# Patient Record
Sex: Female | Born: 1935 | Race: White | Hispanic: No | Marital: Married | Smoking: Former smoker
Health system: Southern US, Community
[De-identification: ages and names within clinical notes are randomized; demographics above are authoritative.]

## PROBLEM LIST (undated history)

## (undated) ENCOUNTER — Emergency Department (HOSPITAL_COMMUNITY): Payer: Medicare Other | Source: Home / Self Care

## (undated) DIAGNOSIS — I509 Heart failure, unspecified: Secondary | ICD-10-CM

## (undated) DIAGNOSIS — I4891 Unspecified atrial fibrillation: Secondary | ICD-10-CM

## (undated) DIAGNOSIS — K5090 Crohn's disease, unspecified, without complications: Secondary | ICD-10-CM

## (undated) DIAGNOSIS — I10 Essential (primary) hypertension: Secondary | ICD-10-CM

## (undated) DIAGNOSIS — N189 Chronic kidney disease, unspecified: Secondary | ICD-10-CM

## (undated) DIAGNOSIS — I499 Cardiac arrhythmia, unspecified: Secondary | ICD-10-CM

## (undated) DIAGNOSIS — S72009A Fracture of unspecified part of neck of unspecified femur, initial encounter for closed fracture: Secondary | ICD-10-CM

## (undated) HISTORY — PX: FRACTURE SURGERY: SHX138

## (undated) HISTORY — PX: OTHER SURGICAL HISTORY: SHX169

## (undated) HISTORY — DX: Chronic kidney disease, unspecified: N18.9

---

## 2015-08-11 DIAGNOSIS — H401131 Primary open-angle glaucoma, bilateral, mild stage: Secondary | ICD-10-CM | POA: Diagnosis not present

## 2015-08-11 DIAGNOSIS — H04123 Dry eye syndrome of bilateral lacrimal glands: Secondary | ICD-10-CM | POA: Diagnosis not present

## 2015-08-25 DIAGNOSIS — F329 Major depressive disorder, single episode, unspecified: Secondary | ICD-10-CM | POA: Diagnosis not present

## 2015-08-25 DIAGNOSIS — F411 Generalized anxiety disorder: Secondary | ICD-10-CM | POA: Diagnosis not present

## 2015-11-02 DIAGNOSIS — H401131 Primary open-angle glaucoma, bilateral, mild stage: Secondary | ICD-10-CM | POA: Diagnosis not present

## 2015-11-02 DIAGNOSIS — H04123 Dry eye syndrome of bilateral lacrimal glands: Secondary | ICD-10-CM | POA: Diagnosis not present

## 2015-11-14 DIAGNOSIS — Z7901 Long term (current) use of anticoagulants: Secondary | ICD-10-CM | POA: Diagnosis not present

## 2015-11-14 DIAGNOSIS — I4891 Unspecified atrial fibrillation: Secondary | ICD-10-CM | POA: Diagnosis not present

## 2015-11-14 DIAGNOSIS — I10 Essential (primary) hypertension: Secondary | ICD-10-CM | POA: Diagnosis not present

## 2015-11-14 DIAGNOSIS — Z79899 Other long term (current) drug therapy: Secondary | ICD-10-CM | POA: Diagnosis not present

## 2015-11-22 DIAGNOSIS — F411 Generalized anxiety disorder: Secondary | ICD-10-CM | POA: Diagnosis not present

## 2015-11-22 DIAGNOSIS — F329 Major depressive disorder, single episode, unspecified: Secondary | ICD-10-CM | POA: Diagnosis not present

## 2016-02-09 DIAGNOSIS — H401131 Primary open-angle glaucoma, bilateral, mild stage: Secondary | ICD-10-CM | POA: Diagnosis not present

## 2016-02-09 DIAGNOSIS — H04123 Dry eye syndrome of bilateral lacrimal glands: Secondary | ICD-10-CM | POA: Diagnosis not present

## 2016-03-13 DIAGNOSIS — I10 Essential (primary) hypertension: Secondary | ICD-10-CM | POA: Diagnosis not present

## 2016-03-13 DIAGNOSIS — F419 Anxiety disorder, unspecified: Secondary | ICD-10-CM | POA: Diagnosis not present

## 2016-03-13 DIAGNOSIS — F329 Major depressive disorder, single episode, unspecified: Secondary | ICD-10-CM | POA: Diagnosis not present

## 2016-03-13 DIAGNOSIS — F411 Generalized anxiety disorder: Secondary | ICD-10-CM | POA: Diagnosis not present

## 2016-04-16 DIAGNOSIS — F411 Generalized anxiety disorder: Secondary | ICD-10-CM | POA: Diagnosis not present

## 2016-04-16 DIAGNOSIS — Z79899 Other long term (current) drug therapy: Secondary | ICD-10-CM | POA: Diagnosis not present

## 2016-04-16 DIAGNOSIS — F419 Anxiety disorder, unspecified: Secondary | ICD-10-CM | POA: Diagnosis not present

## 2016-04-16 DIAGNOSIS — F329 Major depressive disorder, single episode, unspecified: Secondary | ICD-10-CM | POA: Diagnosis not present

## 2016-05-16 DIAGNOSIS — Z79899 Other long term (current) drug therapy: Secondary | ICD-10-CM | POA: Diagnosis not present

## 2016-05-16 DIAGNOSIS — Z7901 Long term (current) use of anticoagulants: Secondary | ICD-10-CM | POA: Diagnosis not present

## 2016-05-16 DIAGNOSIS — R609 Edema, unspecified: Secondary | ICD-10-CM | POA: Diagnosis not present

## 2016-05-16 DIAGNOSIS — I4891 Unspecified atrial fibrillation: Secondary | ICD-10-CM | POA: Diagnosis not present

## 2016-05-16 DIAGNOSIS — I10 Essential (primary) hypertension: Secondary | ICD-10-CM | POA: Diagnosis not present

## 2016-05-16 DIAGNOSIS — Z87891 Personal history of nicotine dependence: Secondary | ICD-10-CM | POA: Diagnosis not present

## 2016-05-21 DIAGNOSIS — M1612 Unilateral primary osteoarthritis, left hip: Secondary | ICD-10-CM | POA: Diagnosis not present

## 2016-05-21 DIAGNOSIS — M25552 Pain in left hip: Secondary | ICD-10-CM | POA: Diagnosis not present

## 2016-05-23 DIAGNOSIS — I10 Essential (primary) hypertension: Secondary | ICD-10-CM | POA: Diagnosis not present

## 2016-05-23 DIAGNOSIS — M791 Myalgia: Secondary | ICD-10-CM | POA: Diagnosis not present

## 2016-05-23 DIAGNOSIS — I509 Heart failure, unspecified: Secondary | ICD-10-CM

## 2016-05-23 HISTORY — DX: Heart failure, unspecified: I50.9

## 2016-05-28 DIAGNOSIS — H40013 Open angle with borderline findings, low risk, bilateral: Secondary | ICD-10-CM | POA: Diagnosis not present

## 2016-05-30 DIAGNOSIS — I371 Nonrheumatic pulmonary valve insufficiency: Secondary | ICD-10-CM | POA: Diagnosis not present

## 2016-05-30 DIAGNOSIS — I351 Nonrheumatic aortic (valve) insufficiency: Secondary | ICD-10-CM | POA: Diagnosis not present

## 2016-05-30 DIAGNOSIS — I340 Nonrheumatic mitral (valve) insufficiency: Secondary | ICD-10-CM | POA: Diagnosis not present

## 2016-05-30 DIAGNOSIS — I517 Cardiomegaly: Secondary | ICD-10-CM | POA: Diagnosis not present

## 2016-05-30 DIAGNOSIS — I361 Nonrheumatic tricuspid (valve) insufficiency: Secondary | ICD-10-CM | POA: Diagnosis not present

## 2016-05-31 DIAGNOSIS — I4891 Unspecified atrial fibrillation: Secondary | ICD-10-CM | POA: Diagnosis present

## 2016-05-31 DIAGNOSIS — I361 Nonrheumatic tricuspid (valve) insufficiency: Secondary | ICD-10-CM | POA: Diagnosis not present

## 2016-05-31 DIAGNOSIS — E872 Acidosis: Secondary | ICD-10-CM | POA: Diagnosis present

## 2016-05-31 DIAGNOSIS — Z880 Allergy status to penicillin: Secondary | ICD-10-CM | POA: Diagnosis not present

## 2016-05-31 DIAGNOSIS — R8271 Bacteriuria: Secondary | ICD-10-CM | POA: Diagnosis present

## 2016-05-31 DIAGNOSIS — I509 Heart failure, unspecified: Secondary | ICD-10-CM | POA: Diagnosis not present

## 2016-05-31 DIAGNOSIS — I130 Hypertensive heart and chronic kidney disease with heart failure and stage 1 through stage 4 chronic kidney disease, or unspecified chronic kidney disease: Secondary | ICD-10-CM | POA: Diagnosis not present

## 2016-05-31 DIAGNOSIS — R0689 Other abnormalities of breathing: Secondary | ICD-10-CM | POA: Diagnosis not present

## 2016-05-31 DIAGNOSIS — N183 Chronic kidney disease, stage 3 (moderate): Secondary | ICD-10-CM | POA: Diagnosis present

## 2016-05-31 DIAGNOSIS — I5020 Unspecified systolic (congestive) heart failure: Secondary | ICD-10-CM | POA: Diagnosis not present

## 2016-05-31 DIAGNOSIS — I10 Essential (primary) hypertension: Secondary | ICD-10-CM | POA: Diagnosis not present

## 2016-05-31 DIAGNOSIS — Z7901 Long term (current) use of anticoagulants: Secondary | ICD-10-CM | POA: Diagnosis not present

## 2016-05-31 DIAGNOSIS — R0602 Shortness of breath: Secondary | ICD-10-CM | POA: Diagnosis not present

## 2016-05-31 DIAGNOSIS — E785 Hyperlipidemia, unspecified: Secondary | ICD-10-CM | POA: Diagnosis present

## 2016-05-31 DIAGNOSIS — I5023 Acute on chronic systolic (congestive) heart failure: Secondary | ICD-10-CM | POA: Diagnosis present

## 2016-05-31 DIAGNOSIS — I517 Cardiomegaly: Secondary | ICD-10-CM | POA: Diagnosis not present

## 2016-05-31 DIAGNOSIS — R0902 Hypoxemia: Secondary | ICD-10-CM | POA: Diagnosis not present

## 2016-05-31 DIAGNOSIS — I482 Chronic atrial fibrillation: Secondary | ICD-10-CM | POA: Diagnosis not present

## 2016-05-31 DIAGNOSIS — I214 Non-ST elevation (NSTEMI) myocardial infarction: Secondary | ICD-10-CM | POA: Diagnosis present

## 2016-05-31 DIAGNOSIS — J9601 Acute respiratory failure with hypoxia: Secondary | ICD-10-CM | POA: Diagnosis not present

## 2016-05-31 DIAGNOSIS — R05 Cough: Secondary | ICD-10-CM | POA: Diagnosis not present

## 2016-05-31 DIAGNOSIS — R739 Hyperglycemia, unspecified: Secondary | ICD-10-CM | POA: Diagnosis present

## 2016-05-31 DIAGNOSIS — I340 Nonrheumatic mitral (valve) insufficiency: Secondary | ICD-10-CM | POA: Diagnosis not present

## 2016-06-05 DIAGNOSIS — E789 Disorder of lipoprotein metabolism, unspecified: Secondary | ICD-10-CM | POA: Diagnosis not present

## 2016-06-06 DIAGNOSIS — E034 Atrophy of thyroid (acquired): Secondary | ICD-10-CM | POA: Diagnosis not present

## 2016-06-06 DIAGNOSIS — F331 Major depressive disorder, recurrent, moderate: Secondary | ICD-10-CM | POA: Diagnosis not present

## 2016-06-06 DIAGNOSIS — I509 Heart failure, unspecified: Secondary | ICD-10-CM | POA: Diagnosis not present

## 2016-06-06 DIAGNOSIS — I10 Essential (primary) hypertension: Secondary | ICD-10-CM | POA: Diagnosis not present

## 2016-06-07 DIAGNOSIS — I4891 Unspecified atrial fibrillation: Secondary | ICD-10-CM | POA: Diagnosis not present

## 2016-06-07 DIAGNOSIS — I509 Heart failure, unspecified: Secondary | ICD-10-CM | POA: Diagnosis not present

## 2016-06-07 DIAGNOSIS — R197 Diarrhea, unspecified: Secondary | ICD-10-CM | POA: Diagnosis not present

## 2016-06-07 DIAGNOSIS — I10 Essential (primary) hypertension: Secondary | ICD-10-CM | POA: Diagnosis not present

## 2016-06-07 DIAGNOSIS — R609 Edema, unspecified: Secondary | ICD-10-CM | POA: Diagnosis not present

## 2016-06-07 DIAGNOSIS — Z79899 Other long term (current) drug therapy: Secondary | ICD-10-CM | POA: Diagnosis not present

## 2016-06-07 DIAGNOSIS — Z7901 Long term (current) use of anticoagulants: Secondary | ICD-10-CM | POA: Diagnosis not present

## 2016-06-11 DIAGNOSIS — Z87891 Personal history of nicotine dependence: Secondary | ICD-10-CM | POA: Diagnosis not present

## 2016-06-11 DIAGNOSIS — Z7901 Long term (current) use of anticoagulants: Secondary | ICD-10-CM | POA: Diagnosis not present

## 2016-06-11 DIAGNOSIS — I10 Essential (primary) hypertension: Secondary | ICD-10-CM | POA: Diagnosis not present

## 2016-06-11 DIAGNOSIS — E7800 Pure hypercholesterolemia, unspecified: Secondary | ICD-10-CM | POA: Diagnosis not present

## 2016-06-11 DIAGNOSIS — Z79899 Other long term (current) drug therapy: Secondary | ICD-10-CM | POA: Diagnosis not present

## 2016-06-11 DIAGNOSIS — I480 Paroxysmal atrial fibrillation: Secondary | ICD-10-CM | POA: Diagnosis not present

## 2016-07-24 ENCOUNTER — Encounter (HOSPITAL_COMMUNITY): Payer: Self-pay

## 2016-07-24 ENCOUNTER — Emergency Department (HOSPITAL_COMMUNITY): Payer: Medicare Other

## 2016-07-24 ENCOUNTER — Inpatient Hospital Stay (HOSPITAL_COMMUNITY)
Admission: EM | Admit: 2016-07-24 | Discharge: 2016-07-30 | DRG: 481 | Disposition: A | Discharge status: Skilled Nursing Facility | Payer: Medicare Other | Attending: Internal Medicine | Admitting: Internal Medicine

## 2016-07-24 DIAGNOSIS — D62 Acute posthemorrhagic anemia: Secondary | ICD-10-CM | POA: Diagnosis not present

## 2016-07-24 DIAGNOSIS — Z79899 Other long term (current) drug therapy: Secondary | ICD-10-CM

## 2016-07-24 DIAGNOSIS — K5090 Crohn's disease, unspecified, without complications: Secondary | ICD-10-CM | POA: Diagnosis present

## 2016-07-24 DIAGNOSIS — R262 Difficulty in walking, not elsewhere classified: Secondary | ICD-10-CM | POA: Diagnosis not present

## 2016-07-24 DIAGNOSIS — F419 Anxiety disorder, unspecified: Secondary | ICD-10-CM | POA: Diagnosis present

## 2016-07-24 DIAGNOSIS — R079 Chest pain, unspecified: Secondary | ICD-10-CM | POA: Diagnosis not present

## 2016-07-24 DIAGNOSIS — I110 Hypertensive heart disease with heart failure: Secondary | ICD-10-CM | POA: Diagnosis present

## 2016-07-24 DIAGNOSIS — I480 Paroxysmal atrial fibrillation: Secondary | ICD-10-CM

## 2016-07-24 DIAGNOSIS — E440 Moderate protein-calorie malnutrition: Secondary | ICD-10-CM | POA: Diagnosis present

## 2016-07-24 DIAGNOSIS — S72001A Fracture of unspecified part of neck of right femur, initial encounter for closed fracture: Secondary | ICD-10-CM | POA: Diagnosis present

## 2016-07-24 DIAGNOSIS — S299XXA Unspecified injury of thorax, initial encounter: Secondary | ICD-10-CM | POA: Diagnosis not present

## 2016-07-24 DIAGNOSIS — Y92512 Supermarket, store or market as the place of occurrence of the external cause: Secondary | ICD-10-CM

## 2016-07-24 DIAGNOSIS — I10 Essential (primary) hypertension: Secondary | ICD-10-CM

## 2016-07-24 DIAGNOSIS — I482 Chronic atrial fibrillation: Secondary | ICD-10-CM | POA: Diagnosis present

## 2016-07-24 DIAGNOSIS — I4819 Other persistent atrial fibrillation: Secondary | ICD-10-CM | POA: Diagnosis not present

## 2016-07-24 DIAGNOSIS — I4891 Unspecified atrial fibrillation: Secondary | ICD-10-CM | POA: Diagnosis not present

## 2016-07-24 DIAGNOSIS — S72141A Displaced intertrochanteric fracture of right femur, initial encounter for closed fracture: Secondary | ICD-10-CM | POA: Diagnosis not present

## 2016-07-24 DIAGNOSIS — W19XXXA Unspecified fall, initial encounter: Secondary | ICD-10-CM | POA: Diagnosis present

## 2016-07-24 DIAGNOSIS — I5032 Chronic diastolic (congestive) heart failure: Secondary | ICD-10-CM | POA: Diagnosis not present

## 2016-07-24 DIAGNOSIS — I509 Heart failure, unspecified: Secondary | ICD-10-CM | POA: Diagnosis present

## 2016-07-24 DIAGNOSIS — F329 Major depressive disorder, single episode, unspecified: Secondary | ICD-10-CM | POA: Diagnosis present

## 2016-07-24 DIAGNOSIS — S72009A Fracture of unspecified part of neck of unspecified femur, initial encounter for closed fracture: Secondary | ICD-10-CM | POA: Diagnosis not present

## 2016-07-24 DIAGNOSIS — Z7901 Long term (current) use of anticoagulants: Secondary | ICD-10-CM

## 2016-07-24 DIAGNOSIS — Z419 Encounter for procedure for purposes other than remedying health state, unspecified: Secondary | ICD-10-CM

## 2016-07-24 DIAGNOSIS — R030 Elevated blood-pressure reading, without diagnosis of hypertension: Secondary | ICD-10-CM | POA: Diagnosis not present

## 2016-07-24 DIAGNOSIS — M1990 Unspecified osteoarthritis, unspecified site: Secondary | ICD-10-CM | POA: Diagnosis not present

## 2016-07-24 DIAGNOSIS — S72001D Fracture of unspecified part of neck of right femur, subsequent encounter for closed fracture with routine healing: Secondary | ICD-10-CM | POA: Diagnosis not present

## 2016-07-24 DIAGNOSIS — Z9181 History of falling: Secondary | ICD-10-CM | POA: Diagnosis not present

## 2016-07-24 DIAGNOSIS — M79604 Pain in right leg: Secondary | ICD-10-CM

## 2016-07-24 DIAGNOSIS — M25551 Pain in right hip: Secondary | ICD-10-CM | POA: Diagnosis not present

## 2016-07-24 HISTORY — DX: Crohn's disease, unspecified, without complications: K50.90

## 2016-07-24 HISTORY — DX: Essential (primary) hypertension: I10

## 2016-07-24 HISTORY — DX: Heart failure, unspecified: I50.9

## 2016-07-24 HISTORY — DX: Cardiac arrhythmia, unspecified: I49.9

## 2016-07-24 HISTORY — DX: Unspecified atrial fibrillation: I48.91

## 2016-07-24 LAB — COMPREHENSIVE METABOLIC PANEL
ALBUMIN: 3.7 g/dL (ref 3.5–5.0)
ALT: 10 U/L — AB (ref 14–54)
AST: 18 U/L (ref 15–41)
Alkaline Phosphatase: 88 U/L (ref 38–126)
Anion gap: 9 (ref 5–15)
BUN: 22 mg/dL — AB (ref 6–20)
CO2: 20 mmol/L — AB (ref 22–32)
Calcium: 8.5 mg/dL — ABNORMAL LOW (ref 8.9–10.3)
Chloride: 107 mmol/L (ref 101–111)
Creatinine, Ser: 1.07 mg/dL — ABNORMAL HIGH (ref 0.44–1.00)
GFR calc Af Amer: 55 mL/min — ABNORMAL LOW (ref >60)
GFR, EST NON AFRICAN AMERICAN: 47 mL/min — AB (ref >60)
Glucose, Bld: 127 mg/dL — ABNORMAL HIGH (ref 65–99)
POTASSIUM: 4.2 mmol/L (ref 3.5–5.1)
SODIUM: 136 mmol/L (ref 135–145)
Total Bilirubin: 0.5 mg/dL (ref 0.3–1.2)
Total Protein: 7 g/dL (ref 6.5–8.1)

## 2016-07-24 LAB — CBC WITH DIFFERENTIAL/PLATELET
BASOS ABS: 0 10*3/uL (ref 0.0–0.1)
BASOS PCT: 0 %
EOS ABS: 0 10*3/uL (ref 0.0–0.7)
EOS PCT: 0 %
HCT: 30.8 % — ABNORMAL LOW (ref 36.0–46.0)
Hemoglobin: 9.7 g/dL — ABNORMAL LOW (ref 12.0–15.0)
Lymphocytes Relative: 4 %
Lymphs Abs: 0.5 10*3/uL — ABNORMAL LOW (ref 0.7–4.0)
MCH: 27.2 pg (ref 26.0–34.0)
MCHC: 31.5 g/dL (ref 30.0–36.0)
MCV: 86.5 fL (ref 78.0–100.0)
MONO ABS: 0.4 10*3/uL (ref 0.1–1.0)
Monocytes Relative: 4 %
Neutro Abs: 9.6 10*3/uL — ABNORMAL HIGH (ref 1.7–7.7)
Neutrophils Relative %: 92 %
PLATELETS: 199 10*3/uL (ref 150–400)
RBC: 3.56 MIL/uL — AB (ref 3.87–5.11)
RDW: 15 % (ref 11.5–15.5)
WBC: 10.5 10*3/uL (ref 4.0–10.5)

## 2016-07-24 MED ORDER — ONDANSETRON HCL 4 MG/2ML IJ SOLN
4.0000 mg | Freq: Once | INTRAMUSCULAR | Status: AC
  Administered 2016-07-24: 4 mg via INTRAVENOUS

## 2016-07-24 MED ORDER — ONDANSETRON HCL 4 MG/2ML IJ SOLN
4.0000 mg | Freq: Once | INTRAMUSCULAR | Status: DC
  Filled 2016-07-24: qty 2

## 2016-07-24 MED ORDER — HYDROMORPHONE HCL 1 MG/ML IJ SOLN
1.0000 mg | Freq: Once | INTRAMUSCULAR | Status: AC
Start: 2016-07-24 — End: 2016-07-24
  Administered 2016-07-24: 1 mg via INTRAVENOUS
  Filled 2016-07-24: qty 1

## 2016-07-24 MED ORDER — HYDROMORPHONE HCL 1 MG/ML IJ SOLN
1.0000 mg | Freq: Once | INTRAMUSCULAR | Status: DC
Start: 2016-07-24 — End: 2016-07-24

## 2016-07-24 NOTE — ED Provider Notes (Signed)
[facility] DEPT Provider Note   CSN: [id number] Arrival date & time: 07/24/16  6301     History   Chief Complaint Chief Complaint  Patient presents with  . Fall    HPI Melissa Mcdaniel is a 83 y.o. female.  The history is provided by the patient. No language interpreter was used.  Fall  This is a new problem. The current episode started 1 to 2 hours ago. The problem occurs constantly. The problem has not changed since onset.Pertinent negatives include no chest pain and no shortness of breath. Nothing aggravates the symptoms. Nothing relieves the symptoms. She has tried nothing for the symptoms.  Pt fell at grocery store and hit right hip.  Pt denies any other areas of injury.  No impact of head.  Pt has a history of atrial fib. She is on eliquis  Past Medical History:  Diagnosis Date  . A-fib ([place])   . Crohn's disease ([place])   . Hypertension     There are no active problems to display for this patient.   History reviewed. No pertinent surgical history.  OB History    No data available       Home Medications    Prior to Admission medications   Medication Sig Start Date End Date Taking? Authorizing Provider  amiodarone (PACERONE) 200 MG tablet Take 200 mg by mouth daily.   Yes Historical Provider, MD  apixaban (ELIQUIS) 2.5 MG TABS tablet Take 2.5 mg by mouth 2 (two) times daily.   Yes Historical Provider, MD  ARIPiprazole (ABILIFY) 5 MG tablet Take 5 mg by mouth daily.   Yes Historical Provider, MD  carvedilol (COREG) 3.125 MG tablet TK 1 T PO BID MEALS 06/02/16  Yes Historical Provider, MD  clonazePAM (KLONOPIN) 0.5 MG tablet TK 1 T PO BID 06/19/16  Yes Historical Provider, MD  hydrALAZINE (APRESOLINE) 25 MG tablet Take 25 mg by mouth 2 (two) times daily.   Yes Historical Provider, MD  latanoprost (XALATAN) 0.005 % ophthalmic solution Place 1 drop into both eyes at bedtime.   Yes Historical Provider, MD  lisinopril (PRINIVIL,ZESTRIL) 2.5 MG tablet TK 1 T PO D  06/02/16  Yes Historical Provider, MD  Multiple Vitamins-Iron (ONE-TABLET-DAILY/IRON PO) Take 1 tablet by mouth daily.   Yes Historical Provider, MD  Niacin (VITAMIN B-3 PO) Take 1 tablet by mouth daily.   Yes Historical Provider, MD  furosemide (LASIX) 20 MG tablet TK 1 T PO QD OR UTD 05/16/16   Historical Provider, MD    Family History No family history on file.  Social History Social History  Substance Use Topics  . Smoking status: Never Smoker  . Smokeless tobacco: Never Used  . Alcohol use No     Allergies   Penicillins   Review of Systems Review of Systems  Respiratory: Negative for shortness of breath.   Cardiovascular: Negative for chest pain.  All other systems reviewed and are negative.    Physical Exam Updated Vital Signs BP 181/72 (BP Location: Right Arm)   Pulse 95   Temp 97.5 F (36.4 C) (Oral)   Resp 20   Ht 5' 1"  (1.549 m)   Wt 48.5 kg   SpO2 93%   BMI 20.22 kg/m   Physical Exam  Constitutional: She appears well-developed and well-nourished.  HENT:  Head: Normocephalic and atraumatic.  Eyes: Pupils are equal, round, and reactive to light.  Neck: Normal range of motion.  Cardiovascular: Normal rate.   Pulmonary/Chest: Effort normal.  Abdominal: Soft.  Musculoskeletal: She exhibits tenderness and deformity.  Swollen right hip.  Shortened, pain with movement.    Neurological: She is alert.  Skin: Skin is warm.  Psychiatric: She has a normal mood and affect.  Nursing note and vitals reviewed.    ED Treatments / Results  Labs (all labs ordered are listed, but only abnormal results are displayed) Labs Reviewed  CBC WITH DIFFERENTIAL/PLATELET - Abnormal; Notable for the following:       Result Value   RBC 3.56 (*)    Hemoglobin 9.7 (*)    HCT 30.8 (*)    Neutro Abs 9.6 (*)    Lymphs Abs 0.5 (*)    All other components within normal limits  COMPREHENSIVE METABOLIC PANEL - Abnormal; Notable for the following:    CO2 20 (*)    Glucose,  Bld 127 (*)    BUN 22 (*)    Creatinine, Ser 1.07 (*)    Calcium 8.5 (*)    ALT 10 (*)    GFR calc non Af Amer 47 (*)    GFR calc Af Amer 55 (*)    All other components within normal limits    EKG  EKG Interpretation None       Radiology Dg Chest 1 View  Result Date: 07/24/2016 CLINICAL DATA:  Pain to the right hip status post fall EXAM: CHEST 1 VIEW COMPARISON:  None. FINDINGS: There are low lung volumes bilaterally with mild elevation of the right diaphragm. Mildly coarse interstitial pattern suspect chronic change. No acute consolidation or effusion. Heart size upper normal. Atherosclerosis. No pneumothorax. IMPRESSION: 1. Low lung volumes without acute infiltrate 2. Borderline cardiomegaly 3. Diffuse coarsening of the interstitium, suspect chronic change. Electronically Signed   By: Donavan Foil M.D.   On: 07/24/2016 20:08   Dg Hip Unilat  With Pelvis 2-3 Views Right  Result Date: 07/24/2016 CLINICAL DATA:  83 y/o  F; right hip pain after fall. EXAM: DG HIP (WITH OR WITHOUT PELVIS) 2-3V RIGHT COMPARISON:  None. FINDINGS: Mildly displaced and comminuted right proximal femur intertrochanteric fracture with medial displacement of the lesser tuberosity. No other pelvic fracture is identified. The left proximal femur is well maintained. The right femoral head is well seated in the acetabulum. IMPRESSION: Mildly displaced and comminuted right proximal femur intertrochanteric fracture and medial displacement of the lesser tuberosity. No joint dislocation. No pelvic fracture is identified. Electronically Signed   By: Kristine Garbe M.D.   On: 07/24/2016 20:07    Procedures Procedures (including critical care time)  Medications Ordered in ED Medications  HYDROmorphone (DILAUDID) injection 1 mg (1 mg Intravenous Given 07/24/16 1908)  ondansetron (ZOFRAN) injection 4 mg (4 mg Intravenous Given 07/24/16 1905)     Initial Impression / Assessment and Plan / ED Course  I have reviewed  the triage vital signs and the nursing notes.  Pertinent labs & imaging results that were available during my care of the patient were reviewed by me and considered in my medical decision making (see chart for details).  Clinical Course as of Jul 25 2019  Tue Jul 24, 2016  1837 DG Hip Unilat  With Pelvis 2-3 Views Right [LS]    Clinical Course User Index [LS] Fransico Meadow, PA-C    I spoke to Dr.  Lorin Mercy.  He advised hospitalist admission.  He will see tomorrow and plan on surgery after holding eliquis  Final Clinical Impressions(s) / ED Diagnoses   Final diagnoses:  Closed right hip fracture, initial  encounter [facility])    New Prescriptions New Prescriptions   No medications on file     Fransico Meadow, Hershal Coria 07/24/16 2059    Jola Schmidt, MD 07/25/16 [phone]

## 2016-07-24 NOTE — ED Triage Notes (Signed)
Per [facility], patient was shopping at the grocery and fell. Patient is independent from home, lives with her daughter. Per [facility] patient appears to have a dislocated right hip.  Denies LOC, no dizziness, no neck or back pain.  Patient is on Eliquis and lisinopril.  Patient with Hx of high blood pressure.

## 2016-07-24 NOTE — H&P (Signed)
[facility] History and Physical  Melissa Mcdaniel UVO:[id number] DOB: 1932/08/07 DOA: 07/24/2016  Referring physician: L. Threasa Mcdaniel, ED PA PCP: [facility] @ [facility]   Chief Complaint: Fall w hip fracture  HPI: Melissa Mcdaniel is a 83 y.o. female with history of HTN, CHF and atrial fibrillation who fell at the [employer] today onto her R side. She has a broken right hip (R intertroch fx).  Got some pain medications and feels a lot better. No other c/o's.   Patient is from [place]. Stayed at home to raise 3 kids.  Moved to [place]yr ago ([facility] at request of [place] who [place] in [place]  [place]. She had admission in November with CHF per the husband, takes lasix 20 mg prn for wt gain of 3 lbs. No hx of CAD/ MI/ stents.  +atrial fib on amio and eliquis and coreg.  No hx CVA.   Had Crohn's which was worse in the past and had partial colectomy per the husband around 2005.    No tobacco or etoh.   ROS  denies CP  no joint pain   no HA  no blurry vision  no rash  no diarrhea  no nausea/ vomiting  no dysuria  no difficulty voiding  no change in urine color    Past Medical History  Past Medical History:  Diagnosis Date  . A-fib ([place]   . Congestive heart failure (CHF) ([place] 05/2016  . Crohn's disease ([place]   . Dysrhythmia    afib dx approx. 2016 per pt  . Hypertension    Past Surgical History  Past Surgical History:  Procedure Laterality Date  . large intestine - partial removal  approx 1993   due to crohns   Family History History reviewed. No pertinent family history. Social History  reports that she has never smoked. She has never used smokeless tobacco. She reports that she does not drink alcohol or use drugs. Allergies  Allergies  Allergen Reactions  . Penicillins Swelling    Swelling of mouth and tongue   Home medications Prior to Admission medications   Medication Sig Start Date  End Date Taking? Authorizing Provider  amiodarone (PACERONE) 200 MG tablet Take 200 mg by mouth daily.   Yes Historical Provider, MD  apixaban (ELIQUIS) 2.5 MG TABS tablet Take 2.5 mg by mouth 2 (two) times daily.   Yes Historical Provider, MD  ARIPiprazole (ABILIFY) 5 MG tablet Take 5 mg by mouth daily.   Yes Historical Provider, MD  carvedilol (COREG) 3.125 MG tablet TK 1 T PO BID MEALS 06/02/16  Yes Historical Provider, MD  clonazePAM (KLONOPIN) 0.5 MG tablet TK 1 T PO BID 06/19/16  Yes Historical Provider, MD  hydrALAZINE (APRESOLINE) 25 MG tablet Take 25 mg by mouth 2 (two) times daily.   Yes Historical Provider, MD  latanoprost (XALATAN) 0.005 % ophthalmic solution Place 1 drop into both eyes at bedtime.   Yes Historical Provider, MD  lisinopril (PRINIVIL,ZESTRIL) 2.5 MG tablet TK 1 T PO D 06/02/16  Yes Historical Provider, MD  Multiple Vitamins-Iron (ONE-TABLET-DAILY/IRON PO) Take 1 tablet by mouth daily.   Yes Historical Provider, MD  Niacin (VITAMIN B-3 PO) Take 1 tablet by mouth daily.   Yes Historical Provider, MD  furosemide (LASIX) 20 MG tablet TK 1 T PO QD OR UTD 05/16/16   Historical Provider, MD   Liver Function Tests  Recent Labs Lab 07/24/16 1946  AST 18  ALT 10*  ALKPHOS 88  BILITOT 0.5  PROT 7.0  ALBUMIN 3.7   No results for input(s): LIPASE, AMYLASE in the last 168 hours. CBC  Recent Labs Lab 07/24/16 1946  WBC 10.5  NEUTROABS 9.6*  HGB 9.7*  HCT 30.8*  MCV 86.5  PLT 774   Basic Metabolic Panel  Recent Labs Lab 07/24/16 1946  NA 136  K 4.2  CL 107  CO2 20*  GLUCOSE 127*  BUN 22*  CREATININE 1.07*  CALCIUM 8.5*     Vitals:   07/24/16 2000 07/24/16 2015 07/24/16 2030 07/24/16 2045  BP: 182/70 180/76 172/71 175/65  Pulse: 96 94 (!) 123 99  Resp: _0 Temp:      TempSrc:      SpO2: 94% 92% (!) 88% 90%  Weight:      Height:       Exam: Gen pleasant elderly WF, no distress lyingflat Tele shows NSR with frequent PAC's No rash,  cyanosis or gangrene Sclera anicteric, throat clear  No jvd or bruits Chest clear bilat,no rales or wheezing RRR no MRG, frequent premature beats Abd soft ntnd no mass or ascites +bs GU defer MS R leg is foreshortened, ext rotated; moderately large area of swelling over R hip Ext no LEor UE edema / no wounds or ulcers Neuro is alert, Ox 3 , nf   Na 136  CO2 20  BUN 22  Cr 1.07  CA 8.5  eGFR 47    Alb 3.7 Hb 9.7   WBC 10  plt 199  EKG (independ reviewed) > sinus tach , no acute changes, +LVH  CXR (independ reviewed) > 1. Low lung volumes without acute infiltrate 2. Borderline cardiomegaly 3. Diffuse coarsening of the interstitium, suspect chronic change.  Xray > Mildly displaced and comminuted right proximal femur intertrochanteric fracture and medial displacement of the lesser tuberosity   Assessment: 1. Fall / R hip fracture - see notes by Dr Melissa Mcdaniel w orthopedics.  Plan is to hold Eliquis and surgery is planned for 48 hrs from now (thursday night).  2. Afib - cont amio/ coreg,rate controlled 3. Hist CHF - on lowdose lasix prn. Can give her lasix for signs of edema, none now.  Avoid large amts of IVF"s.  4. HTN - cont meds 5. Crohn's -has chronic mild symptoms, hasn't been on an medical Rx for this in years 6. Psych - cont Abilify/ Klonopin  Plan - as above     [place] D [facility] Pager [phone]   If 7PM-7AM, please contact night-coverage [email or web address] Password [facility] 07/24/2016, 9:34 PM

## 2016-07-24 NOTE — ED Notes (Signed)
Attempted IV x2.  Will notify [place], Therapist, sports.

## 2016-07-24 NOTE — Progress Notes (Signed)
Patient ID: Melissa Mcdaniel, female   DOB: 1933/05/17, 83 y.o.   MRN: [id number] On eliquis, will need to allow it to wear off. Have posted her for surgery Thursday evening at about 7 PM. ( UpToDate half life recommendations on Eliquis) .  I will see her tomorrow evening to discuss surgery for her trochanteric fracture.  My cell [phone].

## 2016-07-25 LAB — CBC
HEMATOCRIT: 31.9 % — AB (ref 36.0–46.0)
Hemoglobin: 9.7 g/dL — ABNORMAL LOW (ref 12.0–15.0)
MCH: 26.5 pg (ref 26.0–34.0)
MCHC: 30.4 g/dL (ref 30.0–36.0)
MCV: 87.2 fL (ref 78.0–100.0)
Platelets: 251 10*3/uL (ref 150–400)
RBC: 3.66 MIL/uL — ABNORMAL LOW (ref 3.87–5.11)
RDW: 15.3 % (ref 11.5–15.5)
WBC: 6.5 10*3/uL (ref 4.0–10.5)

## 2016-07-25 LAB — MRSA PCR SCREENING: MRSA BY PCR: NEGATIVE

## 2016-07-25 LAB — BASIC METABOLIC PANEL
Anion gap: 9 (ref 5–15)
BUN: 19 mg/dL (ref 6–20)
CO2: 22 mmol/L (ref 22–32)
Calcium: 8.8 mg/dL — ABNORMAL LOW (ref 8.9–10.3)
Chloride: 106 mmol/L (ref 101–111)
Creatinine, Ser: 1.09 mg/dL — ABNORMAL HIGH (ref 0.44–1.00)
GFR calc Af Amer: 54 mL/min — ABNORMAL LOW (ref >60)
GFR, EST NON AFRICAN AMERICAN: 46 mL/min — AB (ref >60)
GLUCOSE: 119 mg/dL — AB (ref 65–99)
POTASSIUM: 4.5 mmol/L (ref 3.5–5.1)
Sodium: 137 mmol/L (ref 135–145)

## 2016-07-25 MED ORDER — LISINOPRIL 2.5 MG PO TABS
2.5000 mg | ORAL_TABLET | Freq: Every day | ORAL | Status: DC
  Filled 2016-07-25: qty 1

## 2016-07-25 MED ORDER — LISINOPRIL 2.5 MG PO TABS
2.5000 mg | ORAL_TABLET | Freq: Once | ORAL | Status: AC
  Administered 2016-07-25: 2.5 mg via ORAL
  Filled 2016-07-25: qty 1

## 2016-07-25 MED ORDER — CHLORHEXIDINE GLUCONATE 0.12 % MT SOLN
15.0000 mL | Freq: Two times a day (BID) | OROMUCOSAL | Status: DC
  Administered 2016-07-25 – 2016-07-29 (×9): 15 mL via OROMUCOSAL
  Filled 2016-07-25 (×10): qty 15

## 2016-07-25 MED ORDER — ONDANSETRON HCL 4 MG/2ML IJ SOLN: 4.0000 mg | Freq: Four times a day (QID) | INTRAMUSCULAR | Status: DC | PRN

## 2016-07-25 MED ORDER — HYDROMORPHONE HCL 1 MG/ML IJ SOLN
0.5000 mg | INTRAMUSCULAR | Status: DC | PRN
  Administered 2016-07-25: 1 mg via INTRAVENOUS
  Administered 2016-07-25: 0.5 mg via INTRAVENOUS
  Administered 2016-07-25: 1 mg via INTRAVENOUS
  Filled 2016-07-25: qty 0.5
  Filled 2016-07-25 (×2): qty 1

## 2016-07-25 MED ORDER — CARVEDILOL 3.125 MG PO TABS
3.1250 mg | ORAL_TABLET | Freq: Two times a day (BID) | ORAL | Status: DC
  Administered 2016-07-25 – 2016-07-30 (×10): 3.125 mg via ORAL
  Filled 2016-07-25 (×10): qty 1

## 2016-07-25 MED ORDER — DOCUSATE SODIUM 100 MG PO CAPS
100.0000 mg | ORAL_CAPSULE | Freq: Two times a day (BID) | ORAL | Status: DC
  Administered 2016-07-25 – 2016-07-30 (×10): 100 mg via ORAL
  Filled 2016-07-25 (×11): qty 1

## 2016-07-25 MED ORDER — BOOST / RESOURCE BREEZE PO LIQD
1.0000 | Freq: Three times a day (TID) | ORAL | Status: DC
  Administered 2016-07-25 – 2016-07-29 (×13): 1 via ORAL

## 2016-07-25 MED ORDER — HYDRALAZINE HCL 25 MG PO TABS
25.0000 mg | ORAL_TABLET | Freq: Two times a day (BID) | ORAL | Status: DC
  Administered 2016-07-25 – 2016-07-27 (×5): 25 mg via ORAL
  Filled 2016-07-25 (×5): qty 1

## 2016-07-25 MED ORDER — ARIPIPRAZOLE 5 MG PO TABS
5.0000 mg | ORAL_TABLET | Freq: Every day | ORAL | Status: DC
  Administered 2016-07-25 – 2016-07-30 (×5): 5 mg via ORAL
  Filled 2016-07-25 (×6): qty 1

## 2016-07-25 MED ORDER — ORAL CARE MOUTH RINSE
15.0000 mL | Freq: Two times a day (BID) | OROMUCOSAL | Status: DC
  Administered 2016-07-27 – 2016-07-29 (×5): 15 mL via OROMUCOSAL

## 2016-07-25 MED ORDER — HYDROMORPHONE HCL 2 MG/ML IJ SOLN
0.5000 mg | INTRAMUSCULAR | Status: DC | PRN
  Administered 2016-07-25: 0.5 mg via INTRAVENOUS
  Administered 2016-07-26: 1 mg via INTRAVENOUS
  Administered 2016-07-29: 0.5 mg via INTRAVENOUS
  Filled 2016-07-25 (×3): qty 1

## 2016-07-25 MED ORDER — POVIDONE-IODINE 10 % EX SWAB: 2.0000 "application " | Freq: Once | CUTANEOUS | Status: DC

## 2016-07-25 MED ORDER — AMIODARONE HCL 100 MG PO TABS
200.0000 mg | ORAL_TABLET | Freq: Every day | ORAL | Status: DC
  Administered 2016-07-25 – 2016-07-30 (×5): 200 mg via ORAL
  Filled 2016-07-25 (×3): qty 2
  Filled 2016-07-25: qty 1
  Filled 2016-07-25: qty 2
  Filled 2016-07-25: qty 1

## 2016-07-25 MED ORDER — HYDROCODONE-ACETAMINOPHEN 5-325 MG PO TABS
1.0000 | ORAL_TABLET | Freq: Four times a day (QID) | ORAL | Status: DC | PRN
  Administered 2016-07-25 (×2): 1 via ORAL
  Filled 2016-07-25 (×2): qty 1

## 2016-07-25 MED ORDER — CEFAZOLIN SODIUM-DEXTROSE 2-4 GM/100ML-% IV SOLN: 2.0000 g | INTRAVENOUS | Status: DC

## 2016-07-25 MED ORDER — BISACODYL 5 MG PO TBEC: 5.0000 mg | DELAYED_RELEASE_TABLET | Freq: Every day | ORAL | Status: DC | PRN

## 2016-07-25 MED ORDER — LATANOPROST 0.005 % OP SOLN
1.0000 [drp] | Freq: Every day | OPHTHALMIC | Status: DC
  Administered 2016-07-26 – 2016-07-29 (×5): 1 [drp] via OPHTHALMIC
  Filled 2016-07-25 (×3): qty 2.5

## 2016-07-25 MED ORDER — CHLORHEXIDINE GLUCONATE 4 % EX LIQD
60.0000 mL | Freq: Once | CUTANEOUS | Status: AC
  Administered 2016-07-26: 4 via TOPICAL

## 2016-07-25 MED ORDER — POLYETHYLENE GLYCOL 3350 17 G PO PACK: 17.0000 g | PACK | Freq: Every day | ORAL | Status: DC | PRN

## 2016-07-25 MED ORDER — CLONAZEPAM 0.5 MG PO TABS
0.5000 mg | ORAL_TABLET | Freq: Two times a day (BID) | ORAL | Status: DC
  Administered 2016-07-25 – 2016-07-30 (×11): 0.5 mg via ORAL
  Filled 2016-07-25 (×11): qty 1

## 2016-07-25 NOTE — Progress Notes (Signed)
[facility] PROGRESS NOTE    Progress Note  Melissa Mcdaniel  ZOX:[id number] DOB: 1932-11-10 DOA: 07/24/2016 PCP: [place] @ [place]     Brief Narrative:   Melissa Mcdaniel is an 83 y.o. female history of hypertension, heart failure atrial fibrillation on Eluquis Full Onto her right side and came into the hospital with a right intratrochanteric fracture.  Assessment/Plan:   Clinical history right hip fracture, initial encounter: Hold Eluquis, orthopedic surgery has been consulted and they recommended and scheduled the patient for intervention on 05/26/2017 and trasnfer to [facility].  Chronic atrial fibrillation: Continue amiodarone and Coreg hold Eluquis rate control.  Essential Hypertension Continue current regimen hold Lasix 24 hours prior to surgery.  Congestive heart failure (CHF) ([place]) Indeterminate whether systolic or diastolic.  Continue Coreg and ACE inhibitor. Hold ACE-I24 hours prior to surgery.  Mechanical Fall  Psychiatry: Abilify and Klonopin.   DVT prophylaxis: Eluquis Family Communication:none Disposition Plan/Barrier to D/C: in 3 days Code Status:     Code Status Orders        Start     Ordered   07/25/16 0002  Full code  Continuous     07/25/16 0001    Code Status History    Date Active Date Inactive Code Status Order ID Comments User Context   This patient has a current code status but no historical code status.        IV Access:    Peripheral IV   Procedures and diagnostic studies:   Dg Chest 1 View  Result Date: 07/24/2016 CLINICAL DATA:  Pain to the right hip status post fall EXAM: CHEST 1 VIEW COMPARISON:  None. FINDINGS: There are low lung volumes bilaterally with mild elevation of the right diaphragm. Mildly coarse interstitial pattern suspect chronic change. No acute consolidation or effusion. Heart size upper normal. Atherosclerosis. No pneumothorax. IMPRESSION: 1. Low lung volumes without acute  infiltrate 2. Borderline cardiomegaly 3. Diffuse coarsening of the interstitium, suspect chronic change. Electronically Signed   By: Donavan Foil M.D.   On: 07/24/2016 20:08   Dg Hip Unilat  With Pelvis 2-3 Views Right  Result Date: 07/24/2016 CLINICAL DATA:  83 y/o  F; right hip pain after fall. EXAM: DG HIP (WITH OR WITHOUT PELVIS) 2-3V RIGHT COMPARISON:  None. FINDINGS: Mildly displaced and comminuted right proximal femur intertrochanteric fracture with medial displacement of the lesser tuberosity. No other pelvic fracture is identified. The left proximal femur is well maintained. The right femoral head is well seated in the acetabulum. IMPRESSION: Mildly displaced and comminuted right proximal femur intertrochanteric fracture and medial displacement of the lesser tuberosity. No joint dislocation. No pelvic fracture is identified. Electronically Signed   By: Kristine Garbe M.D.   On: 07/24/2016 20:07     Medical Consultants:    None.  Anti-Infectives:   None  Subjective:    Melissa Mcdaniel no complaints pain control.  Objective:    Vitals:   07/24/16 2344 07/25/16 0015 07/25/16 0221 07/25/16 0648  BP: 165/62 (!) 184/68 (!) 144/60 (!) 144/59  Pulse: 94 (!) 101 (!) 102 94  Resp: 15 16 14 18   Temp:  98.9 F (37.2 C) 98.2 F (36.8 C) 98.2 F (36.8 C)  TempSrc:  Oral  Oral  SpO2: 95% 97% 93% 100%  Weight:  50.1 kg (110 lb 6.4 oz)    Height:  5' 2"  (1.575 m)      Intake/Output Summary (Last 24 hours) at 07/25/16 0911 Last data filed at 07/25/16  0037  Gross per 24 hour  Intake                0 ml  Output              225 ml  Net             -225 ml   Filed Weights   07/24/16 1734 07/25/16 0015  Weight: 48.5 kg (107 lb) 50.1 kg (110 lb 6.4 oz)    Exam: General exam: In no acute distress. Respiratory system: Good air movement and clear to auscultation. Cardiovascular system: S1 & S2 heard, RRR. No JVD. Gastrointestinal system: Abdomen is nondistended,  soft and nontender.  Central nervous system: Alert and oriented. No focal neurological deficits. Extremities: No pedal edema. Skin: No rashes, lesions or ulcers Psychiatry: Judgement and insight appear normal. Mood & affect appropriate.    Data Reviewed:    Labs: Basic Metabolic Panel:  Recent Labs Lab 07/24/16 1946 07/25/16 0530  NA 136 137  K 4.2 4.5  CL 107 106  CO2 20* 22  GLUCOSE 127* 119*  BUN 22* 19  CREATININE 1.07* 1.09*  CALCIUM 8.5* 8.8*   GFR Estimated Creatinine Clearance: 32 mL/min (by C-G formula based on SCr of 1.09 mg/dL (H)). Liver Function Tests:  Recent Labs Lab 07/24/16 1946  AST 18  ALT 10*  ALKPHOS 88  BILITOT 0.5  PROT 7.0  ALBUMIN 3.7   No results for input(s): LIPASE, AMYLASE in the last 168 hours. No results for input(s): AMMONIA in the last 168 hours. Coagulation profile No results for input(s): INR, PROTIME in the last 168 hours.  CBC:  Recent Labs Lab 07/24/16 1946 07/25/16 0530  WBC 10.5 6.5  NEUTROABS 9.6*  --   HGB 9.7* 9.7*  HCT 30.8* 31.9*  MCV 86.5 87.2  PLT 199 251   Cardiac Enzymes: No results for input(s): CKTOTAL, CKMB, CKMBINDEX, TROPONINI in the last 168 hours. BNP (last 3 results) No results for input(s): PROBNP in the last 8760 hours. CBG: No results for input(s): GLUCAP in the last 168 hours. D-Dimer: No results for input(s): DDIMER in the last 72 hours. Hgb A1c: No results for input(s): HGBA1C in the last 72 hours. Lipid Profile: No results for input(s): CHOL, HDL, LDLCALC, TRIG, CHOLHDL, LDLDIRECT in the last 72 hours. Thyroid function studies: No results for input(s): TSH, T4TOTAL, T3FREE, THYROIDAB in the last 72 hours.  Invalid input(s): FREET3 Anemia work up: No results for input(s): VITAMINB12, FOLATE, FERRITIN, TIBC, IRON, RETICCTPCT in the last 72 hours. Sepsis Labs:  Recent Labs Lab 07/24/16 1946 07/25/16 0530  WBC 10.5 6.5   Microbiology No results found for this or any  previous visit (from the past 240 hour(s)).   Medications:   . amiodarone  200 mg Oral Daily  . ARIPiprazole  5 mg Oral Daily  . carvedilol  3.125 mg Oral BID WC  . chlorhexidine  15 mL Mouth Rinse BID  . clonazePAM  0.5 mg Oral BID  . docusate sodium  100 mg Oral BID  . hydrALAZINE  25 mg Oral BID  . latanoprost  1 drop Both Eyes QHS  . lisinopril  2.5 mg Oral Daily  . mouth rinse  15 mL Mouth Rinse q12n4p   Continuous Infusions:  Time spent: 25 min   LOS: 1 day   Charlynne Cousins  [facility] Pager [phone]  *Please refer to [email or web address], password [email or web address] to get updated schedule on who will round on this  patient, as hospitalists switch teams weekly. If 7PM-7AM, please contact night-coverage at [email or web address], password [email or web address] for any overnight needs.  07/25/2016, 9:11 AM

## 2016-07-25 NOTE — Progress Notes (Signed)
Visited with pt and husband in rm. She'll have surgery tomorrow. Pt asked me from what church I'd come. Explained that chaplains worked for hospital and prayed with all pts. Provided spiritual/emotional support and prayer, esp.for good surgical outcome. Pt was unsure if communion minister had been by today, as she'd transferred in. Advised her they should come by every other day, a priest could be called if needed, and hospital chaplains were available during daytime hrs for prayer. She and husband were appreciative. She thanks God every day for a new day.   N.B. While we were praying, whether b/c she was speaking and not breathing deeply or b/c she was holding my hand, her oxygen machine alarm sounded periodically, and I'd stop and ask her to breathe deeply. Nurse came in to check it, and afterwards told me it was fine, likely our touching momentarily disrupted the reading.  Chaplain available for f/u.    07/25/16 1500  Clinical Encounter Type  Visited With Patient and family together  Visit Type Initial;Psychological support;Spiritual support;Social support;Pre-op  Referral From Chaplain  Spiritual Encounters  Spiritual Needs Prayer;Emotional  Stress Factors  Patient Stress Factors Health changes;Loss of control  Family Stress Factors Family relationships;Health changes;Loss of control   Gerrit Heck, Chaplain

## 2016-07-25 NOTE — Progress Notes (Addendum)
Patient ID: Melissa Mcdaniel, female   DOB: 1933/05/01, 83 y.o.   MRN: [id number]   I spoke with [facility] hospital[facility] Dr. Olevia Bowens and advised him of the patient's husband and daughters request for Dr. [name] to do the surgery. Stated he will make contact with their office today. Advised him that Dr. Lorin Mercy is already posted patient to have surgery tomorrow and we are still willing to proceed but we will await to hear back from him. We can change our plan if Dr. [name] assumes care.

## 2016-07-25 NOTE — Progress Notes (Signed)
Pt transferred to [facility] 5N. SRP, RN

## 2016-07-25 NOTE — Consult Note (Addendum)
Patient ID: Pheobe Sandiford MRN: [id number] DOB/AGE: Jul 06, 1933 83 y.o.  Admit date: 07/24/2016  Admission Diagnoses:  Principal Problem:   Hip fracture ([place]) Active Problems:   Hypertension   A-fib (H[facility])   Congestive heart failure (CHF) (H[facility])   Fall   HPI: Patient was seen this morning at the request of try hospitalist for right hip fracture. Patient suffered a fall yesterday at [employer] and landed directly onto her right hip. Unable to weight-bear after the incident. X-rays showed a displaced comminuted right proximal femur IT fracture.  History primarily provided by patient's husband who was present. Recently moved to [place] to be closer to her daughter.  Past Medical History: Past Medical History:  Diagnosis Date  . A-fib ([place])   . Congestive heart failure (CHF) ([place]) 05/2016  . Crohn's disease ([place])   . Dysrhythmia    afib dx approx. 2016 per pt  . Hypertension     Surgical History: Past Surgical History:  Procedure Laterality Date  . large intestine - partial removal  approx 1993   due to crohns    Family History: History reviewed. No pertinent family history.  Social History: Social History   Social History  . Marital status: Married    Spouse name: N/A  . Number of children: N/A  . Years of education: N/A   Occupational History  . Not on file.   Social History Main Topics  . Smoking status: Never Smoker  . Smokeless tobacco: Never Used  . Alcohol use No  . Drug use: No  . Sexual activity: Not on file   Other Topics Concern  . Not on file   Social History Narrative  . No narrative on file    Allergies: Penicillins  Medications: I have reviewed the patient's current medications.  Vital Signs: Patient Vitals for the past 24 hrs:  BP Temp Temp src Pulse Resp SpO2 Height Weight  07/25/16 0648 (!) 144/59 98.2 F (36.8 C) Oral 94 18 100 % - -  07/25/16 0221 (!) 144/60 98.2 F (36.8 C) - (!) 102 14 93 % - -  07/25/16 0015 (!)  184/68 98.9 F (37.2 C) Oral (!) 101 16 97 % 5' 2"  (1.575 m) 110 lb 6.4 oz (50.1 kg)  07/24/16 2344 165/62 - - 94 15 95 % - -  07/24/16 2145 178/63 - - 94 17 96 % - -  07/24/16 2130 161/67 - - - 15 - - -  07/24/16 2115 161/67 - - 98 14 98 % - -  07/24/16 2100 157/71 - - 98 15 (!) 89 % - -  07/24/16 2045 175/65 - - 99 16 90 % - -  07/24/16 2030 172/71 - - (!) 123 15 (!) 88 % - -  07/24/16 2015 180/76 - - 94 14 92 % - -  07/24/16 2000 182/70 - - 96 12 94 % - -  07/24/16 1945 184/80 - - - 16 - - -  07/24/16 1937 181/72 - - 95 20 93 % - -  07/24/16 1915 170/60 - - 89 - (!) 86 % - -  07/24/16 1734 - - - - - - 5' 1"  (1.549 m) 107 lb (48.5 kg)  07/24/16 1730 (!) 143/116 - - 76 - 100 % - -  07/24/16 1729 173/74 97.5 F (36.4 C) Oral 80 15 100 % - -    Radiology: Dg Chest 1 View  Result Date: 07/24/2016 CLINICAL DATA:  Pain to the right hip status  post fall EXAM: CHEST 1 VIEW COMPARISON:  None. FINDINGS: There are low lung volumes bilaterally with mild elevation of the right diaphragm. Mildly coarse interstitial pattern suspect chronic change. No acute consolidation or effusion. Heart size upper normal. Atherosclerosis. No pneumothorax. IMPRESSION: 1. Low lung volumes without acute infiltrate 2. Borderline cardiomegaly 3. Diffuse coarsening of the interstitium, suspect chronic change. Electronically Signed   By: Donavan Foil M.D.   On: 07/24/2016 20:08   Dg Hip Unilat  With Pelvis 2-3 Views Right  Result Date: 07/24/2016 CLINICAL DATA:  83 y/o  F; right hip pain after fall. EXAM: DG HIP (WITH OR WITHOUT PELVIS) 2-3V RIGHT COMPARISON:  None. FINDINGS: Mildly displaced and comminuted right proximal femur intertrochanteric fracture with medial displacement of the lesser tuberosity. No other pelvic fracture is identified. The left proximal femur is well maintained. The right femoral head is well seated in the acetabulum. IMPRESSION: Mildly displaced and comminuted right proximal femur intertrochanteric  fracture and medial displacement of the lesser tuberosity. No joint dislocation. No pelvic fracture is identified. Electronically Signed   By: Kristine Garbe M.D.   On: 07/24/2016 20:07    Labs:  Recent Labs  07/24/16 1946 07/25/16 0530  WBC 10.5 6.5  RBC 3.56* 3.66*  HCT 30.8* 31.9*  PLT 199 251    Recent Labs  07/24/16 1946 07/25/16 0530  NA 136 137  K 4.2 4.5  CL 107 106  CO2 20* 22  BUN 22* 19  CREATININE 1.07* 1.09*  GLUCOSE 127* 119*  CALCIUM 8.5* 8.8*   No results for input(s): LABPT, INR in the last 72 hours.  Review of Systems: Review of Systems  Constitutional: Negative.   Respiratory: Negative for shortness of breath.   Cardiovascular: Negative for chest pain.  Gastrointestinal: Negative.   Genitourinary: Negative.   Musculoskeletal: Positive for falls and joint pain.  Neurological: Negative.     Physical Exam: Patient is alert. Question some confusion. Husband present to give history. No respiratory distress. Abdomen flat nondistended. Cervical spine good range of motion. There is shortening of the right lower extremity. Bilateral calves are nontender. Skin warm and dry. No focal motor deficits.  Assessment and Plan: Right hip IT fracture. Patient on eliquis for history of atrial fibrillation.  Patient's husband was present advised that best treatment option for this problem is ORIF procedure. Surgical procedure discussed. Advised that we will need to wait until tomorrow since she has been on eliquis.  Will likely need skilled nursing facility placement postop for rehabilitation. Advised patient and husband that my attending Dr. Rodell Perna has already reviewed x-rays and patient is scheduled for surgical intervention tomorrow afternoon.  patient's husband advised me that he would like to have Dr. [name] orthopedic surgeon here in town do the surgery. Stated that Dr. [name] did his daughters ankle surgery and this is who she recommended. Stated  that he is not sure as to whether or not it is Dr. Quillian Quince or [facility]. We'll discuss with [facility] hospitalist and they can make contact with [facility]. All questions answered.  Benjiman Core PA-C For Rodell Perna MD [facility] orthopedics [phone]                                    Pt seen examined and chart and xrays reviewed . Discussed with patients Husband that we would let Dr. [name] know .

## 2016-07-25 NOTE — Progress Notes (Signed)
Initial Nutrition Assessment  DOCUMENTATION CODES:   Non-severe (moderate) malnutrition in context of chronic illness  INTERVENTION:  Provide Boost Breeze po TID, each supplement provides 250 kcal and 9 grams of protein.  Encouraged intake of adequate calories and protein from meals/snacks/beverages now and after surgery for healing.  NUTRITION DIAGNOSIS:   Increased nutrient needs related to other (see comment) (healing s/p right hip fixation) as evidenced by estimated needs.  GOAL:   Patient will meet greater than or equal to 90% of their needs  MONITOR:   PO intake, Supplement acceptance, Labs, Weight trends, I & O's  REASON FOR ASSESSMENT:   Consult Hip fracture protocol  ASSESSMENT:   83 year old female with history of HTN, CHF, atrial fibrillation, and Crohn's disease who presents after mechanical fall. Found to have right hip fracture with plan for surgery 1/4.    Spoke with patient at bedside. She reports poor appetite now in setting of right hip pain. Denies N/V or abdominal pain. She reports appetite was excellent prior to fall and typical intake is 2-3 meals per day. In setting of Crohn's patient avoids lactose, acidic foods (tomatoes), and raw fruits and vegetables. Patient is amenable to drinking ONS to help meet increased needs, but does not want Ensure even though RD assured her it does not have any lactose.   Patient reports UBW 114 lbs and that she has lost weight since her mechanical fall. No weight history in chart to verify. This would be a 4 lbs weight loss (4% body weight) over 1 day, which is highly unlikely as patient reports she had both breakfast and lunch yesterday before her fall.   Medications reviewed and include: Colace, Dilaudid PRN.  Labs reviewed: Creatinine 1.09, Glucose 119.   Nutrition-Focused physical exam completed. Findings are mild-moderate fat depletion, mild-moderate muscle depletion, and no edema.   Diet Order:  Diet Heart Room  service appropriate? Yes; Fluid consistency: Thin  Skin:  Reviewed, no issues  Last BM:  Unknown  Height:   Ht Readings from Last 1 Encounters:  07/25/16 5' 2"  (1.575 m)    Weight:   Wt Readings from Last 1 Encounters:  07/25/16 110 lb 6.4 oz (50.1 kg)    Ideal Body Weight:  50 kg  BMI:  Body mass index is 20.19 kg/m.  Estimated Nutritional Needs:   Kcal:  1225-1430 (HBE x 1.2-1.4)  Protein:  60-70 grams (1.2-1.4 grams/kg)  Fluid:  >/= 1.2 L/day (25 ml/kg)  EDUCATION NEEDS:   No education needs identified at this time  Willey Blade, MS, RD, LDN Pager: [phone] After Hours Pager: [phone]

## 2016-07-26 ENCOUNTER — Inpatient Hospital Stay (HOSPITAL_COMMUNITY): Payer: Medicare Other | Admitting: Anesthesiology

## 2016-07-26 ENCOUNTER — Encounter (HOSPITAL_COMMUNITY)
Admission: EM | Disposition: A | Discharge status: Skilled Nursing Facility | Payer: Self-pay | Source: Home / Self Care | Attending: Internal Medicine

## 2016-07-26 ENCOUNTER — Inpatient Hospital Stay (HOSPITAL_COMMUNITY): Payer: Medicare Other

## 2016-07-26 ENCOUNTER — Telehealth: Payer: Self-pay | Admitting: Cardiovascular Disease

## 2016-07-26 ENCOUNTER — Encounter (HOSPITAL_COMMUNITY): Payer: Self-pay | Admitting: Surgery

## 2016-07-26 DIAGNOSIS — E440 Moderate protein-calorie malnutrition: Secondary | ICD-10-CM | POA: Insufficient documentation

## 2016-07-26 HISTORY — PX: FEMUR IM NAIL: SHX1597

## 2016-07-26 LAB — ABO/RH: ABO/RH(D): O POS

## 2016-07-26 SURGERY — FIXATION, FRACTURE, INTERTROCHANTERIC, WITH INTRAMEDULLARY ROD
Anesthesia: Choice | Laterality: Right

## 2016-07-26 SURGERY — INSERTION, INTRAMEDULLARY ROD, FEMUR
Anesthesia: General | Site: Hip | Laterality: Right

## 2016-07-26 MED ORDER — LACTATED RINGERS IV SOLN
INTRAVENOUS | Status: DC
  Administered 2016-07-26: 50 mL/h via INTRAVENOUS
  Administered 2016-07-26: 23:00:00 via INTRAVENOUS

## 2016-07-26 MED ORDER — FENTANYL CITRATE (PF) 100 MCG/2ML IJ SOLN
INTRAMUSCULAR | Status: DC | PRN
  Administered 2016-07-26 (×2): 50 ug via INTRAVENOUS
  Administered 2016-07-26: 100 ug via INTRAVENOUS

## 2016-07-26 MED ORDER — FENTANYL CITRATE (PF) 100 MCG/2ML IJ SOLN
25.0000 ug | INTRAMUSCULAR | Status: DC | PRN
  Administered 2016-07-26: 25 ug via INTRAVENOUS

## 2016-07-26 MED ORDER — 0.9 % SODIUM CHLORIDE (POUR BTL) OPTIME
TOPICAL | Status: DC | PRN
  Administered 2016-07-26: 1000 mL

## 2016-07-26 MED ORDER — VANCOMYCIN HCL IN DEXTROSE 1-5 GM/200ML-% IV SOLN: 1000.0000 mg | INTRAVENOUS | Status: DC

## 2016-07-26 MED ORDER — ROCURONIUM 10MG/ML (10ML) SYRINGE FOR MEDFUSION PUMP - OPTIME
INTRAVENOUS | Status: DC | PRN
  Administered 2016-07-26: 15 mg via INTRAVENOUS
  Administered 2016-07-26: 25 mg via INTRAVENOUS

## 2016-07-26 MED ORDER — PROPOFOL 10 MG/ML IV BOLUS
INTRAVENOUS | Status: DC | PRN
  Administered 2016-07-26: 70 mg via INTRAVENOUS

## 2016-07-26 MED ORDER — CLINDAMYCIN PHOSPHATE 600 MG/50ML IV SOLN
600.0000 mg | Freq: Four times a day (QID) | INTRAVENOUS | Status: DC
  Administered 2016-07-26 – 2016-07-27 (×2): 600 mg via INTRAVENOUS
  Filled 2016-07-26 (×3): qty 50

## 2016-07-26 MED ORDER — HYDROCODONE-ACETAMINOPHEN 5-325 MG PO TABS: 1.0000 | ORAL_TABLET | Freq: Four times a day (QID) | ORAL | 0 refills | Status: DC | PRN

## 2016-07-26 MED ORDER — METOCLOPRAMIDE HCL 5 MG/ML IJ SOLN: 5.0000 mg | Freq: Three times a day (TID) | INTRAMUSCULAR | Status: DC | PRN

## 2016-07-26 MED ORDER — POVIDONE-IODINE 10 % EX SWAB: 2.0000 "application " | Freq: Once | CUTANEOUS | Status: DC

## 2016-07-26 MED ORDER — CHLORHEXIDINE GLUCONATE 4 % EX LIQD
60.0000 mL | Freq: Once | CUTANEOUS | Status: AC
  Administered 2016-07-26: 4 via TOPICAL

## 2016-07-26 MED ORDER — METOCLOPRAMIDE HCL 5 MG PO TABS: 5.0000 mg | ORAL_TABLET | Freq: Three times a day (TID) | ORAL | Status: DC | PRN

## 2016-07-26 MED ORDER — ONDANSETRON HCL 4 MG/2ML IJ SOLN
INTRAMUSCULAR | Status: AC
  Filled 2016-07-26: qty 2

## 2016-07-26 MED ORDER — ONDANSETRON HCL 4 MG/2ML IJ SOLN
INTRAMUSCULAR | Status: DC | PRN
  Administered 2016-07-26: 4 mg via INTRAVENOUS

## 2016-07-26 MED ORDER — SUGAMMADEX SODIUM 200 MG/2ML IV SOLN
INTRAVENOUS | Status: DC | PRN
  Administered 2016-07-26: 100 mg via INTRAVENOUS

## 2016-07-26 MED ORDER — PHENYLEPHRINE 40 MCG/ML (10ML) SYRINGE FOR IV PUSH (FOR BLOOD PRESSURE SUPPORT)
PREFILLED_SYRINGE | INTRAVENOUS | Status: AC
  Filled 2016-07-26: qty 10

## 2016-07-26 MED ORDER — KETOROLAC TROMETHAMINE 15 MG/ML IJ SOLN
INTRAMUSCULAR | Status: AC
  Filled 2016-07-26: qty 1

## 2016-07-26 MED ORDER — LACTATED RINGERS IV SOLN
INTRAVENOUS | Status: DC
  Administered 2016-07-26 (×2): via INTRAVENOUS

## 2016-07-26 MED ORDER — ACETAMINOPHEN 325 MG PO TABS
650.0000 mg | ORAL_TABLET | Freq: Four times a day (QID) | ORAL | Status: DC | PRN
  Administered 2016-07-29: 650 mg via ORAL
  Filled 2016-07-26 (×2): qty 2

## 2016-07-26 MED ORDER — FENTANYL CITRATE (PF) 100 MCG/2ML IJ SOLN
INTRAMUSCULAR | Status: AC
  Filled 2016-07-26: qty 2

## 2016-07-26 MED ORDER — CLINDAMYCIN PHOSPHATE 600 MG/50ML IV SOLN
600.0000 mg | INTRAVENOUS | Status: AC
  Administered 2016-07-26: 600 mg via INTRAVENOUS
  Filled 2016-07-26 (×2): qty 50

## 2016-07-26 MED ORDER — BACLOFEN 10 MG PO TABS: 10.0000 mg | ORAL_TABLET | Freq: Three times a day (TID) | ORAL | 0 refills | Status: DC | PRN

## 2016-07-26 MED ORDER — KETOROLAC TROMETHAMINE 15 MG/ML IJ SOLN
7.5000 mg | Freq: Four times a day (QID) | INTRAMUSCULAR | Status: DC
Start: 2016-07-26 — End: 2016-07-27
  Administered 2016-07-26 – 2016-07-27 (×3): 7.5 mg via INTRAVENOUS
  Filled 2016-07-26 (×2): qty 1

## 2016-07-26 MED ORDER — EPHEDRINE 5 MG/ML INJ
INTRAVENOUS | Status: AC
  Filled 2016-07-26: qty 10

## 2016-07-26 MED ORDER — HYDROCODONE-ACETAMINOPHEN 5-325 MG PO TABS
1.0000 | ORAL_TABLET | ORAL | Status: DC | PRN
  Administered 2016-07-27: 1 via ORAL
  Administered 2016-07-27: 2 via ORAL
  Administered 2016-07-28: 1 via ORAL
  Administered 2016-07-28: 2 via ORAL
  Administered 2016-07-28: 1 via ORAL
  Administered 2016-07-29 – 2016-07-30 (×5): 2 via ORAL
  Filled 2016-07-26: qty 1
  Filled 2016-07-26 (×2): qty 2
  Filled 2016-07-26: qty 1
  Filled 2016-07-26 (×4): qty 2
  Filled 2016-07-26: qty 1
  Filled 2016-07-26: qty 2

## 2016-07-26 MED ORDER — ACETAMINOPHEN 650 MG RE SUPP: 650.0000 mg | Freq: Four times a day (QID) | RECTAL | Status: DC | PRN

## 2016-07-26 MED ORDER — PROPOFOL 10 MG/ML IV BOLUS
INTRAVENOUS | Status: AC
  Filled 2016-07-26: qty 20

## 2016-07-26 MED ORDER — APIXABAN 2.5 MG PO TABS
2.5000 mg | ORAL_TABLET | Freq: Two times a day (BID) | ORAL | Status: DC
  Administered 2016-07-27: 2.5 mg via ORAL
  Filled 2016-07-26: qty 1

## 2016-07-26 MED ORDER — PHENYLEPHRINE HCL 10 MG/ML IJ SOLN
INTRAMUSCULAR | Status: DC | PRN
  Administered 2016-07-26: 40 ug via INTRAVENOUS

## 2016-07-26 SURGICAL SUPPLY — 33 items
CLOSURE STERI-STRIP 1/2X4 (GAUZE/BANDAGES/DRESSINGS) ×1
CLSR STERI-STRIP ANTIMIC 1/2X4 (GAUZE/BANDAGES/DRESSINGS) ×2 IMPLANT
COVER PERINEAL POST (MISCELLANEOUS) ×3 IMPLANT
COVER SURGICAL LIGHT HANDLE (MISCELLANEOUS) ×3 IMPLANT
DRAPE STERI IOBAN 125X83 (DRAPES) ×3 IMPLANT
DRSG MEPILEX BORDER 4X4 (GAUZE/BANDAGES/DRESSINGS) ×6 IMPLANT
DURAPREP 26ML APPLICATOR (WOUND CARE) ×3 IMPLANT
ELECT REM PT RETURN 9FT ADLT (ELECTROSURGICAL) ×3
ELECTRODE REM PT RTRN 9FT ADLT (ELECTROSURGICAL) ×1 IMPLANT
GLOVE BIO SURGEON STRL SZ7.5 (GLOVE) ×6 IMPLANT
GLOVE BIOGEL PI IND STRL 8 (GLOVE) ×2 IMPLANT
GLOVE BIOGEL PI INDICATOR 8 (GLOVE) ×4
GOWN STRL REUS W/ TWL LRG LVL3 (GOWN DISPOSABLE) ×3 IMPLANT
GOWN STRL REUS W/TWL LRG LVL3 (GOWN DISPOSABLE) ×6
GUIDEROD T2 3X1000 (ROD) ×3 IMPLANT
K-WIRE  3.2X450M STR (WIRE) ×2
K-WIRE 3.2X450M STR (WIRE) ×1
KIT BASIN OR (CUSTOM PROCEDURE TRAY) ×3 IMPLANT
KIT ROOM TURNOVER OR (KITS) ×3 IMPLANT
KWIRE 3.2X450M STR (WIRE) ×1 IMPLANT
MANIFOLD NEPTUNE II (INSTRUMENTS) ×3 IMPLANT
NAIL GAMMA LG R 5TI 10X360X125 (Nail) ×3 IMPLANT
NS IRRIG 1000ML POUR BTL (IV SOLUTION) ×3 IMPLANT
PACK GENERAL/GYN (CUSTOM PROCEDURE TRAY) ×3 IMPLANT
PAD ARMBOARD 7.5X6 YLW CONV (MISCELLANEOUS) ×6 IMPLANT
SCREW LAG GAMMA 3 TI 10.5X90MM (Screw) ×3 IMPLANT
SUT MNCRL AB 4-0 PS2 18 (SUTURE) IMPLANT
SUT MON AB 2-0 CT1 36 (SUTURE) IMPLANT
SUT VIC AB 0 CT1 27 (SUTURE) ×2
SUT VIC AB 0 CT1 27XBRD ANBCTR (SUTURE) ×1 IMPLANT
TOWEL OR 17X24 6PK STRL BLUE (TOWEL DISPOSABLE) ×3 IMPLANT
TOWEL OR 17X26 10 PK STRL BLUE (TOWEL DISPOSABLE) ×3 IMPLANT
WATER STERILE IRR 1000ML POUR (IV SOLUTION) ×3 IMPLANT

## 2016-07-26 NOTE — Transfer of Care (Signed)
Immediate Anesthesia Transfer of Care Note  Patient: Melissa Mcdaniel  Procedure(s) Performed: Procedure(s): INTRAMEDULLARY (IM) NAIL FEMORAL (Right)  Patient Location: PACU  Anesthesia Type:General  Level of Consciousness: awake, oriented, sedated, patient cooperative and responds to stimulation  Airway & Oxygen Therapy: Patient Spontanous Breathing and Patient connected to nasal cannula oxygen  Post-op Assessment: Report given to RN, Post -op Vital signs reviewed and stable, Patient moving all extremities and Patient moving all extremities X 4  Post vital signs: Reviewed and stable  Last Vitals:  Vitals:   07/26/16 0031 07/26/16 0359  BP: (!) 145/51 (!) 150/52  Pulse: 96 81  Resp: 16 16  Temp: 37.1 C 37.1 C    Last Pain:  Vitals:   07/26/16 1000  TempSrc:   PainSc: 0-No pain      Patients Stated Pain Goal: 3 (97/53/00 5110)  Complications: No apparent anesthesia complications

## 2016-07-26 NOTE — Evaluation (Addendum)
Physical Therapy Evaluation Patient Details Name: Brynnleigh Mcelwee MRN: [id number] DOB: 01/29/1933 Today's Date: 07/26/2016   History of Present Illness  83 y.o. female admitted to [facility] on 07/24/16 after a fall with R hip fx.  Pt s/p L hip IM NAil on 07/27/15.  Pt with significant PMHx of HTN, Chron's disease, CHF, A-fib, partial removal of large intestine.    Clinical Impression  Pt's husband stepped out into the hallway to get someone to help his wife toilet in the bed.  RNs and RN techs were busy with an emergency, so PT stepped in to help this POD #0 hip fx/IM nail pt mobilize in the bed (I offered to help her to [facility], but she had not had any pain medication since the PACU and she was too painful to attempt.  She was extremely guarded and it took significant extra time just to roll bil to get on and off of bed pan an change sheets.  Pt may, depending on progress, need SNF for rehab, but it will depend on progress.   PT to follow acutely for deficits listed below.       Follow Up Recommendations SNF;Supervision/Assistance - 24 hour    Equipment Recommendations  Rolling walker with 5" wheels;3in1 (PT);Hospital bed;Other (comment) (hosptial bed so that she can stay downstairs)    Recommendations for Other Services   NA    Precautions / Restrictions Precautions Precautions: Fall Restrictions Weight Bearing Restrictions: Yes RLE Weight Bearing: Weight bearing as tolerated      Mobility  Bed Mobility Overal bed mobility: Needs Assistance Bed Mobility: Rolling Rolling: Max assist         General bed mobility comments: Max assist to roll bil.  Pt guarded and moving very slowly, she did not want PT to help with her leg or touch leg, but she also could not move it on her own.  She was incontinent in the bed and the sheets needed to be changed.   Transfers                 General transfer comment: PT refused to attempt EOB or OOB to [facility] due to pain and just having woke up after  surgery.                                            Pertinent Vitals/Pain Pain Assessment: Faces Faces Pain Scale: Hurts worst Pain Location: right leg and hip Pain Descriptors / Indicators: Aching;Burning;Grimacing;Guarding Pain Intervention(s): Limited activity within patient's tolerance;Monitored during session;Repositioned;Patient requesting pain meds-RN notified    Home Living Family/patient expects to be discharged to:: Private residence Living Arrangements: Children;Spouse/significant other Available Help at Discharge: Family Type of Home: House Home Access: Stairs to enter Entrance Stairs-Rails: Psychiatric nurse of Steps: 2 Home Layout: Two level;Bed/bath upstairs Home Equipment: None      Prior Function Level of Independence: Independent                  Extremity/Trunk Assessment   Upper Extremity Assessment Upper Extremity Assessment: Defer to OT evaluation    Lower Extremity Assessment Lower Extremity Assessment: RLE deficits/detail RLE Deficits / Details: right leg with normal post op pain and weakness.  Ankle 3-/5, knee guarded, hip guarded difficult to assess RLE: Unable to fully assess due to pain    Cervical / Trunk Assessment Cervical / Trunk Assessment: Normal  Communication   Communication: No difficulties  Cognition Arousal/Alertness: Awake/alert Behavior During Therapy: WFL for tasks assessed/performed Overall Cognitive Status: Impaired/Different from baseline                 General Comments: Pt was unaware of hving surgery, but she was also just waking up after coming back to room after surgery.            Assessment/Plan    PT Assessment Patient needs continued PT services  PT Problem List Decreased strength;Decreased range of motion;Decreased activity tolerance;Decreased balance;Decreased mobility;Decreased knowledge of use of DME          PT Treatment Interventions DME  instruction;Gait training;Stair training;Functional mobility training;Therapeutic activities;Therapeutic exercise;Balance training;Patient/family education;Modalities;Manual techniques    PT Goals (Current goals can be found in the Care Plan section)  Acute Rehab PT Goals Patient Stated Goal: to decrease pain PT Goal Formulation: With patient Time For Goal Achievement: 08/09/16 Potential to Achieve Goals: Good    Frequency Min 5X/week   Barriers to discharge Inaccessible home environment pt's bedroom is upstairs       End of Session Equipment Utilized During Treatment: Oxygen Activity Tolerance: Patient limited by pain Patient left: in bed;with call bell/phone within reach;with family/visitor present Nurse Communication: Patient requests pain meds         Time: 1730-1801 PT Time Calculation (min) (ACUTE ONLY): 31 min   Charges:   PT Evaluation $PT Eval Moderate Complexity: 1 Procedure          Jurni Cesaro B. Wells, [place], DPT [phone]   07/26/2016, 10:34 PM

## 2016-07-26 NOTE — Telephone Encounter (Signed)
I spoke with Melissa Mcdaniel's husband. He reports Melissa Mcdaniel is currently in  hospital. He thinks she will be able to come in for new Melissa Mcdaniel appt in a month or so.  Appt made for Melissa Mcdaniel to see Dr. Angelena Form on 09/14/16 at 10:30.

## 2016-07-26 NOTE — Progress Notes (Signed)
BP 193-200/60's, SR  80's    . Dr Jenita Seashore here and aware. No new orders. Will cont to monitor.

## 2016-07-26 NOTE — Op Note (Signed)
DATE OF SURGERY:  07/26/2016  TIME: 10:50 AM  PATIENT NAME:  Melissa Mcdaniel  AGE: 83 y.o.  PRE-OPERATIVE DIAGNOSIS:  HIP FRACTURE  POST-OPERATIVE DIAGNOSIS:  SAME  PROCEDURE:  INTRAMEDULLARY (IM) NAIL FEMORAL  SURGEON:  Derrion Tritz D  ASSISTANT:  Roxan Hockey, PA-C, he was present and scrubbed throughout the case, critical for completion in a timely fashion, and for retraction, instrumentation, and closure.   OPERATIVE IMPLANTS: [employer]ker Gamma Nail  PREOPERATIVE INDICATIONS:  [name] is a 83 y.o. year old who fell and suffered a hip fracture. She was brought into the ER and then admitted and optimized and then elected for surgical intervention.    The risks benefits and alternatives were discussed with the patient including but not limited to the risks of nonoperative treatment, versus surgical intervention including infection, bleeding, nerve injury, malunion, nonunion, hardware prominence, hardware failure, need for hardware removal, blood clots, cardiopulmonary complications, morbidity, mortality, among others, and they were willing to proceed.    OPERATIVE PROCEDURE:  The patient was brought to the operating room and placed in the supine position. General anesthesia was administered. She was placed on the fracture table.  Closed reduction was performed under C-arm guidance. Time out was then performed after sterile prep and drape. She received preoperative antibiotics.  Incision was made proximal to the greater trochanter. A guidewire was placed in the appropriate position. Confirmation was made on AP and lateral views. The above-named nail was opened. I opened the proximal femur with a reamer. I then placed the nail by hand easily down. I did not need to ream the femur.  Once the nail was completely seated, I placed a guidepin into the femoral head into the center center position. I measured the length, and then reamed the lateral cortex and up into the head.  I then placed the lag screw. Slight compression was applied. Anatomic fixation achieved. Bone quality was mediocre.  I then secured the proximal interlocking bolt, and took off a half a turn, and then removed the instruments, and took final C-arm pictures AP and lateral the entire length of the leg.   Anatomic reconstruction was achieved, and the wounds were irrigated copiously and closed with Vicryl followed by staples and sterile gauze for the skin. The patient was awakened and returned to PACU in stable and satisfactory condition. There no complications and the patient tolerated the procedure well.  She will be weightbearing as tolerated, and will be on chemical px  for a period of four weeks after discharge.   Edmonia Lynch, M.D.

## 2016-07-26 NOTE — Interval H&P Note (Signed)
History and Physical Interval Note:  07/26/2016 7:16 AM  Melissa Mcdaniel  has presented today for surgery, with the diagnosis of HIP FRACTURE  The various methods of treatment have been discussed with the patient and family. After consideration of risks, benefits and other options for treatment, the patient has consented to  Procedure(s): INTRAMEDULLARY (IM) NAIL FEMORAL (Right) as a surgical intervention .  The patient's history has been reviewed, patient examined, no change in status, stable for surgery.  I have reviewed the patient's chart and labs.  Questions were answered to the patient's satisfaction.     Ellison Leisure D

## 2016-07-26 NOTE — Anesthesia Procedure Notes (Signed)
Procedure Name: Intubation Date/Time: 07/26/2016 9:59 AM Performed by: Jacquiline Doe A Pre-anesthesia Checklist: Patient identified, Emergency Drugs available, Suction available and Patient being monitored Patient Re-evaluated:Patient Re-evaluated prior to inductionOxygen Delivery Method: Circle System Utilized and Circle system utilized Preoxygenation: Pre-oxygenation with 100% oxygen Intubation Type: IV induction and Cricoid Pressure applied Ventilation: Mask ventilation without difficulty and Oral airway inserted - appropriate to patient size Laryngoscope Size: Mac and 3 Grade View: Grade I Tube type: Oral Tube size: 7.5 mm Number of attempts: 1 Airway Equipment and Method: Stylet and Oral airway Placement Confirmation: ETT inserted through vocal cords under direct vision,  positive ETCO2 and breath sounds checked- equal and bilateral Secured at: 22 cm Tube secured with: Tape Dental Injury: Teeth and Oropharynx as per pre-operative assessment  Comments: Intubation per Helyn Numbers , RN under supervision Dr. Jenita Seashore .

## 2016-07-26 NOTE — Anesthesia Preprocedure Evaluation (Addendum)
Anesthesia Evaluation  Patient identified by MRN, date of birth, ID band Patient awake    Reviewed: Allergy & Precautions, NPO status , Patient's Chart, lab work & pertinent test results, reviewed documented beta blocker date and time   History of Anesthesia Complications Negative for: history of anesthetic complications  Airway Mallampati: II  TM Distance: >3 FB Neck ROM: Full    Dental  (+) Edentulous Upper, Edentulous Lower   Pulmonary neg pulmonary ROS,    breath sounds clear to auscultation       Cardiovascular hypertension, Pt. on medications (-) angina+ dysrhythmias Atrial Fibrillation  Rhythm:Irregular Rate:Normal     Neuro/Psych negative neurological ROS     GI/Hepatic negative GI ROS, Neg liver ROS,   Endo/Other  negative endocrine ROS  Renal/GU negative Renal ROS     Musculoskeletal  (+) Arthritis , Osteoarthritis,    Abdominal   Peds  Hematology  (+) Blood dyscrasia (Hb 9.7), , eliquis   Anesthesia Other Findings   Reproductive/Obstetrics                            Anesthesia Physical Anesthesia Plan  ASA: III  Anesthesia Plan: General   Post-op Pain Management:    Induction: Intravenous  Airway Management Planned: Oral ETT  Additional Equipment:   Intra-op Plan:   Post-operative Plan: Extubation in OR  Informed Consent: I have reviewed the patients History and Physical, chart, labs and discussed the procedure including the risks, benefits and alternatives for the proposed anesthesia with the patient or authorized representative who has indicated his/her understanding and acceptance.     Plan Discussed with: CRNA and Surgeon  Anesthesia Plan Comments: (Plan routine monitors, GETA )        Anesthesia Quick Evaluation

## 2016-07-26 NOTE — Consult Note (Signed)
ORTHOPAEDIC CONSULTATION  REQUESTING PHYSICIAN: Charlynne Cousins, MD  Chief Complaint: Right hip pain status post mechanical fall at [employer] On 07/24/2016  Assessment / Plan: Principal Problem:   Closed right hip fracture, initial encounter [facility]) Active Problems:   Hypertension   A-fib (HCC)   Congestive heart failure (CHF) ([place])   Fall  Plan for Operative fixation today. -NPO  -Medicine team to admit / pre-op clearance -PT/OT post op -will ammend WB status postop, bedrest for now -VTE prophylaxis: On eliquis 2.5 mg BID for AFIB.  Currently held. Will plan to continue post op. Dispo: The patient was previously very active per her report and ambulatory without aid.  She lives with her husband, daughter and son-in-law.  We'll likely need rehabilitation, possible SNF postop.  The risks benefits and alternatives of the procedure were discussed with the patient including but not limited to infection, bleeding, nerve injury, the need for revision surgery, blood clots, cardiopulmonary complications, morbidity, mortality, among others.  The patient verbalizes understanding and wishes to proceed.    HPI: Melissa Mcdaniel is a 83 y.o. female with a history of HTN, CHF, Crohn's with partial colectomy, and A. fib who complains of  right hip pain and inability to bear weight after fall of [employer] [place] on 07/24/2016.    XR shows Mildly displaced and comminuted right proximal femur intertrochanteric fracture and medial displacement of the lesser tuberosity. No joint dislocation. No pelvic fracture is identified..  Orthopedics was consulted for evaluation.    Past Medical History:  Diagnosis Date  . A-fib ([place])   . Congestive heart failure (CHF) ([place]) 05/2016  . Crohn's disease ([place])   . Dysrhythmia    afib dx approx. 2016 per pt  . Hypertension    Past Surgical History:  Procedure Laterality Date  . large intestine - partial removal  approx 1993   due to crohns   Social  History   Social History  . Marital status: Married    Spouse name: N/A  . Number of children: N/A  . Years of education: N/A   Social History Main Topics  . Smoking status: Never Smoker  . Smokeless tobacco: Never Used  . Alcohol use No  . Drug use: No  . Sexual activity: Not Asked   Other Topics Concern  . None   Social History Narrative  . None   History reviewed. No pertinent family history. Allergies  Allergen Reactions  . Penicillins Swelling    Swelling of mouth and tongue   Prior to Admission medications   Medication Sig Start Date End Date Taking? Authorizing Provider  amiodarone (PACERONE) 200 MG tablet Take 200 mg by mouth daily.   Yes Historical Provider, MD  apixaban (ELIQUIS) 2.5 MG TABS tablet Take 2.5 mg by mouth 2 (two) times daily.   Yes Historical Provider, MD  ARIPiprazole (ABILIFY) 5 MG tablet Take 5 mg by mouth daily.   Yes Historical Provider, MD  carvedilol (COREG) 3.125 MG tablet TK 1 T PO BID MEALS 06/02/16  Yes Historical Provider, MD  clonazePAM (KLONOPIN) 0.5 MG tablet TK 1 T PO BID 06/19/16  Yes Historical Provider, MD  hydrALAZINE (APRESOLINE) 25 MG tablet Take 25 mg by mouth 2 (two) times daily.   Yes Historical Provider, MD  latanoprost (XALATAN) 0.005 % ophthalmic solution Place 1 drop into both eyes at bedtime.   Yes Historical Provider, MD  lisinopril (PRINIVIL,ZESTRIL) 2.5 MG tablet TK 1 T PO D 06/02/16  Yes Historical  Provider, MD  Multiple Vitamins-Iron (ONE-TABLET-DAILY/IRON PO) Take 1 tablet by mouth daily.   Yes Historical Provider, MD  Niacin (VITAMIN B-3 PO) Take 1 tablet by mouth daily.   Yes Historical Provider, MD  furosemide (LASIX) 20 MG tablet TK 1 T PO QD OR UTD 05/16/16   Historical Provider, MD   Dg Chest 1 View  Result Date: 07/24/2016 CLINICAL DATA:  Pain to the right hip status post fall EXAM: CHEST 1 VIEW COMPARISON:  None. FINDINGS: There are low lung volumes bilaterally with mild elevation of the right diaphragm.  Mildly coarse interstitial pattern suspect chronic change. No acute consolidation or effusion. Heart size upper normal. Atherosclerosis. No pneumothorax. IMPRESSION: 1. Low lung volumes without acute infiltrate 2. Borderline cardiomegaly 3. Diffuse coarsening of the interstitium, suspect chronic change. Electronically Signed   By: Donavan Foil M.D.   On: 07/24/2016 20:08   Dg Hip Unilat  With Pelvis 2-3 Views Right  Result Date: 07/24/2016 CLINICAL DATA:  83 y/o  F; right hip pain after fall. EXAM: DG HIP (WITH OR WITHOUT PELVIS) 2-3V RIGHT COMPARISON:  None. FINDINGS: Mildly displaced and comminuted right proximal femur intertrochanteric fracture with medial displacement of the lesser tuberosity. No other pelvic fracture is identified. The left proximal femur is well maintained. The right femoral head is well seated in the acetabulum. IMPRESSION: Mildly displaced and comminuted right proximal femur intertrochanteric fracture and medial displacement of the lesser tuberosity. No joint dislocation. No pelvic fracture is identified. Electronically Signed   By: Kristine Garbe M.D.   On: 07/24/2016 20:07    Positive ROS: All other systems have been reviewed and were otherwise negative with the exception of those mentioned in the HPI and as above.  Objective: Labs cbc  Recent Labs  07/24/16 1946 07/25/16 0530  WBC 10.5 6.5  HGB 9.7* 9.7*  HCT 30.8* 31.9*  PLT 199 251     Recent Labs  07/24/16 1946 07/25/16 0530  NA 136 137  K 4.2 4.5  CL 107 106  CO2 20* 22  GLUCOSE 127* 119*  BUN 22* 19  CREATININE 1.07* 1.09*  CALCIUM 8.5* 8.8*    Physical Exam: Vitals:   07/26/16 0031 07/26/16 0359  BP: (!) 145/51 (!) 150/52  Pulse: 96 81  Resp: 16 16  Temp: 98.8 F (37.1 C) 98.8 F (37.1 C)   General: Alert, no acute distress.  Husband at bedside. Mental status: Alert and Oriented x3 Neurologic: Speech Clear and organized, no gross focal findings or movement disorder  appreciated. Respiratory: No cyanosis, no use of accessory musculature Cardiovascular: No pedal edema. Irregularly irregular rhythm. Rate regular. GI: Abdomen is soft and non-tender, non-distended. Skin: Warm and dry.  No lesions in the area of chief complaint  Extremities: Warm and well perfused w/o edema Psychiatric: Patient is competent for consent with normal mood and affect MUSCULOSKELETAL:  Right leg pain with range of motion. Shortened/externally rotated. No lesions. Neurovascularly intact distally. EHL/FHL, dorsiflexion/plantar flexion intact. Other extremities are atraumatic with painless ROM and NVI.   Charna Elizabeth Martensen III PA-C 07/26/2016 7:00 AM

## 2016-07-26 NOTE — Anesthesia Postprocedure Evaluation (Addendum)
Anesthesia Post Note  Patient: Barnetta Chapel Groll  Procedure(s) Performed: Procedure(s) (LRB): INTRAMEDULLARY (IM) NAIL FEMORAL (Right)  Patient location during evaluation: PACU Anesthesia Type: General Level of consciousness: awake and alert, patient cooperative and oriented Pain management: pain level controlled Vital Signs Assessment: post-procedure vital signs reviewed and stable Respiratory status: spontaneous breathing, nonlabored ventilation, respiratory function stable and patient connected to nasal cannula oxygen Cardiovascular status: blood pressure returned to baseline and stable Postop Assessment: no signs of nausea or vomiting Anesthetic complications: no       Last Vitals:  Vitals:   07/26/16 0359 07/26/16 1115  BP: (!) 150/52   Pulse: 81   Resp: 16   Temp: 37.1 C 36.3 C    Last Pain:  Vitals:   07/26/16 1115  TempSrc:   PainSc: 0-No pain                 Malakie Balis,E. Carrington Mullenax

## 2016-07-26 NOTE — Addendum Note (Signed)
Addendum  created 07/26/16 1156 by Annye Asa, MD   Visit Navigator Flowsheet section accepted

## 2016-07-26 NOTE — Progress Notes (Addendum)
[facility] PROGRESS NOTE    Progress Note   Melissa Mcdaniel  MEQ:[id number] DOB: May 22, 1933 DOA: 07/24/2016   PCP: [name]   Brief Narrative:   83 y.o. female with history of hypertension, heart failure atrial fibrillation on El[place] nn her right side and came into the hospital with a right intratrochanteric fracture.  Assessment/Plan:   Clinical history right hip fracture, initial encounter: - continue to hold Eluquis, orthopedic surgery has been consulted - pt taken to OR this AM  Chronic atrial fibrillation: - Continue amiodarone and Coreg  - hold Eluquis until post op   Essential Hypertension - reasonable inpatient control   Congestive heart failure (CHF) (HCC) - Indeterminate whether systolic or diastolic.  - Continue Coreg and ACE inhibitor. - Hold ACE-I24 hours prior to surgery. - weight trend since admission 107 --> 110 lbs this AM  Mechanical Fall - will need PT eval post op  Anxiety, depression  - Abilify and Klonopin.   Moderate PCM - appreciate nutritionist recommendations   DVT prophylaxis: Eluquis Family Communication: husband at bedside  Disposition Plan/Barrier to D/C: when ortho team clears  Code Status:     Code Status Orders        Start     Ordered   07/25/16 0002  Full code  Continuous     07/25/16 0001    Code Status History    Date Active Date Inactive Code Status Order ID Comments User Context   This patient has a current code status but no historical code status.      IV Access:    Peripheral IV  Procedures and diagnostic studies:   Dg Chest 1 View  Result Date: 07/24/2016 CLINICAL DATA:  Pain to the right hip status post fall EXAM: CHEST 1 VIEW COMPARISON:  None. FINDINGS: There are low lung volumes bilaterally with mild elevation of the right diaphragm. Mildly coarse interstitial pattern suspect chronic change. No acute consolidation or effusion. Heart size upper normal.  Atherosclerosis. No pneumothorax. IMPRESSION: 1. Low lung volumes without acute infiltrate 2. Borderline cardiomegaly 3. Diffuse coarsening of the interstitium, suspect chronic change. Electronically Signed   By: Donavan Foil M.D.   On: 07/24/2016 20:08   Dg Hip Unilat  With Pelvis 2-3 Views Right  Result Date: 07/24/2016 CLINICAL DATA:  83 y/o  F; right hip pain after fall. EXAM: DG HIP (WITH OR WITHOUT PELVIS) 2-3V RIGHT COMPARISON:  None. FINDINGS: Mildly displaced and comminuted right proximal femur intertrochanteric fracture with medial displacement of the lesser tuberosity. No other pelvic fracture is identified. The left proximal femur is well maintained. The right femoral head is well seated in the acetabulum. IMPRESSION: Mildly displaced and comminuted right proximal femur intertrochanteric fracture and medial displacement of the lesser tuberosity. No joint dislocation. No pelvic fracture is identified. Electronically Signed   By: Kristine Garbe M.D.   On: 07/24/2016 20:07    Medical Consultants:    None.  Anti-Infectives:    None  Subjective:   No concerns today.   Objective:    Vitals:   07/25/16 1300 07/25/16 2126 07/26/16 0031 07/26/16 0359  BP: (!) 144/46 (!) 137/49 (!) 145/51 (!) 150/52  Pulse: 96 78 96 81  Resp: 18  16 16   Temp: 99.1 F (37.3 C) 99.1 F (37.3 C) 98.8 F (37.1 C) 98.8 F (37.1 C)  TempSrc: Oral Oral Oral Oral  SpO2: 100% 93% 98% 98%  Weight:      Height:  Intake/Output Summary (Last 24 hours) at 07/26/16 1032 Last data filed at 07/25/16 1700  Gross per 24 hour  Intake              240 ml  Output              400 ml  Net             -160 ml   Filed Weights   07/24/16 1734 07/25/16 0015  Weight: 48.5 kg (107 lb) 50.1 kg (110 lb 6.4 oz)    Exam: General exam: In no acute distress. Respiratory system: Good air movement and clear to auscultation. Cardiovascular system: S1 & S2 heard, RRR. No JVD. Gastrointestinal  system: Abdomen is nondistended, soft and nontender.  Central nervous system: Alert and oriented. No focal neurological deficits. Extremities: No pedal edema. Skin: No rashes, lesions or ulcers Psychiatry: Judgement and insight appear normal. Mood & affect appropriate.    Data Reviewed:    Labs: Basic Metabolic Panel:  Recent Labs Lab 07/24/16 1946 07/25/16 0530  NA 136 137  K 4.2 4.5  CL 107 106  CO2 20* 22  GLUCOSE 127* 119*  BUN 22* 19  CREATININE 1.07* 1.09*  CALCIUM 8.5* 8.8*   Liver Function Tests:  Recent Labs Lab 07/24/16 1946  AST 18  ALT 10*  ALKPHOS 88  BILITOT 0.5  PROT 7.0  ALBUMIN 3.7   CBC:  Recent Labs Lab 07/24/16 1946 07/25/16 0530  WBC 10.5 6.5  NEUTROABS 9.6*  --   HGB 9.7* 9.7*  HCT 30.8* 31.9*  MCV 86.5 87.2  PLT 199 251   Sepsis Labs:  Recent Labs Lab 07/24/16 1946 07/25/16 0530  WBC 10.5 6.5   Microbiology Recent Results (from the past 240 hour(s))  MRSA PCR Screening     Status: None   Collection Time: 07/25/16  3:42 PM  Result Value Ref Range Status   MRSA by PCR NEGATIVE NEGATIVE Final    Medications:   . [MAR Hold] amiodarone  200 mg Oral Daily  . [MAR Hold] ARIPiprazole  5 mg Oral Daily  . [MAR Hold] carvedilol  3.125 mg Oral BID WC  . [MAR Hold] chlorhexidine  15 mL Mouth Rinse BID  . [MAR Hold] clonazePAM  0.5 mg Oral BID  . [MAR Hold] docusate sodium  100 mg Oral BID  . [MAR Hold] feeding supplement  1 Container Oral TID BM  . [MAR Hold] hydrALAZINE  25 mg Oral BID  . [MAR Hold] latanoprost  1 drop Both Eyes QHS  . [MAR Hold] mouth rinse  15 mL Mouth Rinse q12n4p  . povidone-iodine  2 application Topical Once   Continuous Infusions: . lactated ringers      Time spent: 25 min   LOS: 2 days   Melissa Mcdaniel  [facility] Pager [phone] Cell [phone]  Please refer to [email or web address], password [email or web address] to get updated schedule on who will round on this patient, as hospitalists switch teams  weekly. If 7PM-7AM, please contact night-coverage at [email or web address], password [email or web address] for any overnight needs.  07/26/2016, 10:32 AM

## 2016-07-26 NOTE — H&P (View-Only) (Signed)
ORTHOPAEDIC CONSULTATION  REQUESTING PHYSICIAN: Charlynne Cousins, MD  Chief Complaint: Right hip pain status post mechanical fall at [employer] On 07/24/2016  Assessment / Plan: Principal Problem:   Closed right hip fracture, initial encounter [facility]) Active Problems:   Hypertension   A-fib (HCC)   Congestive heart failure (CHF) ([place])   Fall  Plan for Operative fixation today. -NPO  -Medicine team to admit / pre-op clearance -PT/OT post op -will ammend WB status postop, bedrest for now -VTE prophylaxis: On eliquis 2.5 mg BID for AFIB.  Currently held. Will plan to continue post op. Dispo: The patient was previously very active per her report and ambulatory without aid.  She lives with her husband, daughter and son-in-law.  We'll likely need rehabilitation, possible SNF postop.  The risks benefits and alternatives of the procedure were discussed with the patient including but not limited to infection, bleeding, nerve injury, the need for revision surgery, blood clots, cardiopulmonary complications, morbidity, mortality, among others.  The patient verbalizes understanding and wishes to proceed.    HPI: Melissa Mcdaniel is a 83 y.o. female with a history of HTN, CHF, Crohn's with partial colectomy, and A. fib who complains of  right hip pain and inability to bear weight after fall of [employer] [place] on 07/24/2016.    XR shows Mildly displaced and comminuted right proximal femur intertrochanteric fracture and medial displacement of the lesser tuberosity. No joint dislocation. No pelvic fracture is identified..  Orthopedics was consulted for evaluation.    Past Medical History:  Diagnosis Date  . A-fib ([place])   . Congestive heart failure (CHF) ([place]) 05/2016  . Crohn's disease ([place])   . Dysrhythmia    afib dx approx. 2016 per pt  . Hypertension    Past Surgical History:  Procedure Laterality Date  . large intestine - partial removal  approx 1993   due to crohns   Social  History   Social History  . Marital status: Married    Spouse name: N/A  . Number of children: N/A  . Years of education: N/A   Social History Main Topics  . Smoking status: Never Smoker  . Smokeless tobacco: Never Used  . Alcohol use No  . Drug use: No  . Sexual activity: Not Asked   Other Topics Concern  . None   Social History Narrative  . None   History reviewed. No pertinent family history. Allergies  Allergen Reactions  . Penicillins Swelling    Swelling of mouth and tongue   Prior to Admission medications   Medication Sig Start Date End Date Taking? Authorizing Provider  amiodarone (PACERONE) 200 MG tablet Take 200 mg by mouth daily.   Yes Historical Provider, MD  apixaban (ELIQUIS) 2.5 MG TABS tablet Take 2.5 mg by mouth 2 (two) times daily.   Yes Historical Provider, MD  ARIPiprazole (ABILIFY) 5 MG tablet Take 5 mg by mouth daily.   Yes Historical Provider, MD  carvedilol (COREG) 3.125 MG tablet TK 1 T PO BID MEALS 06/02/16  Yes Historical Provider, MD  clonazePAM (KLONOPIN) 0.5 MG tablet TK 1 T PO BID 06/19/16  Yes Historical Provider, MD  hydrALAZINE (APRESOLINE) 25 MG tablet Take 25 mg by mouth 2 (two) times daily.   Yes Historical Provider, MD  latanoprost (XALATAN) 0.005 % ophthalmic solution Place 1 drop into both eyes at bedtime.   Yes Historical Provider, MD  lisinopril (PRINIVIL,ZESTRIL) 2.5 MG tablet TK 1 T PO D 06/02/16  Yes Historical  Provider, MD  Multiple Vitamins-Iron (ONE-TABLET-DAILY/IRON PO) Take 1 tablet by mouth daily.   Yes Historical Provider, MD  Niacin (VITAMIN B-3 PO) Take 1 tablet by mouth daily.   Yes Historical Provider, MD  furosemide (LASIX) 20 MG tablet TK 1 T PO QD OR UTD 05/16/16   Historical Provider, MD   Dg Chest 1 View  Result Date: 07/24/2016 CLINICAL DATA:  Pain to the right hip status post fall EXAM: CHEST 1 VIEW COMPARISON:  None. FINDINGS: There are low lung volumes bilaterally with mild elevation of the right diaphragm.  Mildly coarse interstitial pattern suspect chronic change. No acute consolidation or effusion. Heart size upper normal. Atherosclerosis. No pneumothorax. IMPRESSION: 1. Low lung volumes without acute infiltrate 2. Borderline cardiomegaly 3. Diffuse coarsening of the interstitium, suspect chronic change. Electronically Signed   By: Donavan Foil M.D.   On: 07/24/2016 20:08   Dg Hip Unilat  With Pelvis 2-3 Views Right  Result Date: 07/24/2016 CLINICAL DATA:  83 y/o  F; right hip pain after fall. EXAM: DG HIP (WITH OR WITHOUT PELVIS) 2-3V RIGHT COMPARISON:  None. FINDINGS: Mildly displaced and comminuted right proximal femur intertrochanteric fracture with medial displacement of the lesser tuberosity. No other pelvic fracture is identified. The left proximal femur is well maintained. The right femoral head is well seated in the acetabulum. IMPRESSION: Mildly displaced and comminuted right proximal femur intertrochanteric fracture and medial displacement of the lesser tuberosity. No joint dislocation. No pelvic fracture is identified. Electronically Signed   By: Kristine Garbe M.D.   On: 07/24/2016 20:07    Positive ROS: All other systems have been reviewed and were otherwise negative with the exception of those mentioned in the HPI and as above.  Objective: Labs cbc  Recent Labs  07/24/16 1946 07/25/16 0530  WBC 10.5 6.5  HGB 9.7* 9.7*  HCT 30.8* 31.9*  PLT 199 251     Recent Labs  07/24/16 1946 07/25/16 0530  NA 136 137  K 4.2 4.5  CL 107 106  CO2 20* 22  GLUCOSE 127* 119*  BUN 22* 19  CREATININE 1.07* 1.09*  CALCIUM 8.5* 8.8*    Physical Exam: Vitals:   07/26/16 0031 07/26/16 0359  BP: (!) 145/51 (!) 150/52  Pulse: 96 81  Resp: 16 16  Temp: 98.8 F (37.1 C) 98.8 F (37.1 C)   General: Alert, no acute distress.  Husband at bedside. Mental status: Alert and Oriented x3 Neurologic: Speech Clear and organized, no gross focal findings or movement disorder  appreciated. Respiratory: No cyanosis, no use of accessory musculature Cardiovascular: No pedal edema. Irregularly irregular rhythm. Rate regular. GI: Abdomen is soft and non-tender, non-distended. Skin: Warm and dry.  No lesions in the area of chief complaint  Extremities: Warm and well perfused w/o edema Psychiatric: Patient is competent for consent with normal mood and affect MUSCULOSKELETAL:  Right leg pain with range of motion. Shortened/externally rotated. No lesions. Neurovascularly intact distally. EHL/FHL, dorsiflexion/plantar flexion intact. Other extremities are atraumatic with painless ROM and NVI.   Charna Elizabeth Martensen III PA-C 07/26/2016 7:00 AM

## 2016-07-26 NOTE — Telephone Encounter (Signed)
NEw message  pt call to F/u on new pt appt for Dr. Angelena Form. please call back to discuss

## 2016-07-27 ENCOUNTER — Encounter (HOSPITAL_COMMUNITY): Payer: Self-pay | Admitting: Orthopedic Surgery

## 2016-07-27 LAB — BASIC METABOLIC PANEL
ANION GAP: 6 (ref 5–15)
BUN: 11 mg/dL (ref 6–20)
CALCIUM: 8.1 mg/dL — AB (ref 8.9–10.3)
CHLORIDE: 100 mmol/L — AB (ref 101–111)
CO2: 27 mmol/L (ref 22–32)
Creatinine, Ser: 0.99 mg/dL (ref 0.44–1.00)
GFR calc non Af Amer: 52 mL/min — ABNORMAL LOW (ref >60)
Glucose, Bld: 138 mg/dL — ABNORMAL HIGH (ref 65–99)
Potassium: 4.1 mmol/L (ref 3.5–5.1)
Sodium: 133 mmol/L — ABNORMAL LOW (ref 135–145)

## 2016-07-27 LAB — CBC
HEMATOCRIT: 20.6 % — AB (ref 36.0–46.0)
HEMOGLOBIN: 6.4 g/dL — AB (ref 12.0–15.0)
MCH: 26.5 pg (ref 26.0–34.0)
MCHC: 30.6 g/dL (ref 30.0–36.0)
MCV: 86.6 fL (ref 78.0–100.0)
Platelets: 159 10*3/uL (ref 150–400)
RBC: 2.38 MIL/uL — ABNORMAL LOW (ref 3.87–5.11)
RDW: 15 % (ref 11.5–15.5)
WBC: 5.2 10*3/uL (ref 4.0–10.5)

## 2016-07-27 LAB — PREPARE RBC (CROSSMATCH)

## 2016-07-27 MED ORDER — SODIUM CHLORIDE 0.9 % IV SOLN
Freq: Once | INTRAVENOUS | Status: AC
  Administered 2016-07-27: 10:00:00 via INTRAVENOUS

## 2016-07-27 NOTE — Care Management Important Message (Signed)
Important Message  Patient Details  Name: Luanne Krzyzanowski MRN: [id number] Date of Birth: Aug 18, 1932   Medicare Important Message Given:  Yes    Asser Lucena 07/27/2016, 1:07 PM

## 2016-07-27 NOTE — Progress Notes (Signed)
[facility] PROGRESS NOTE    Progress Note   [name]  IRJ:[id number] DOB: 04-Jun-1933 DOA: 07/24/2016   PCP: [name]   Brief Narrative:   83 y.o. female with history of hypertension, heart failure atrial fibrillation on Eluquis Fell nn her right side and came into the hospital with a right intratrochanteric fracture.  Assessment/Plan:   Clinical history right hip fracture, initial encounter: - continue to hold Eluquis, orthopedic surgery has been consulted - s/p hip repair, post op day #1 - post op anemia, needs two units of PRBC   Post op anemia - Hg down to 6.4, transfuse two U PRBC - CBC in AM  Chronic atrial fibrillation: - Continue amiodarone and Coreg  - hold Eluquis until Hg stabilizes   Essential Hypertension - SBP in 90's, continue Coreg - hold hydralazine   Congestive heart failure (CHF) ([place]) - Indeterminate whether systolic or diastolic.  - Continue Coreg and ACE inhibitor. - Hold ACE-I24 hours prior to surgery. - weight trend since admission 107 --> 110 lbs this AM  Mechanical Fall - will need PT eval post op  Anxiety, depression  - Abilify and Klonopin.   Moderate PCM - appreciate nutritionist recommendations   DVT prophylaxis: Eluquis Family Communication: husband at bedside  Disposition Plan/Barrier to D/C: when Hg stable  Code Status:     Code Status Orders        Start     Ordered   07/25/16 0002  Full code  Continuous     07/25/16 0001    Code Status History    Date Active Date Inactive Code Status Order ID Comments User Context   This patient has a current code status but no historical code status.      IV Access:    Peripheral IV  Procedures and diagnostic studies:   Dg C-arm 1-60 Min  Result Date: 07/26/2016 CLINICAL DATA:  Right femur surgery. EXAM: RIGHT FEMUR 2 VIEWS; DG C-ARM 61-120 MIN COMPARISON:  No recent . FINDINGS: It should medullary rod and screw fixation right  femur. Hardware intact. Anatomic alignment. Three images obtained. 0 minutes 41 seconds fluoroscopy time utilized. IMPRESSION: Postsurgical change right femur. Hardware intact. Anatomic alignment. Electronically Signed   By: Marcello Moores  Register   On: 07/26/2016 10:57   Dg Hip Port Unilat With Pelvis 1v Right  Result Date: 07/26/2016 CLINICAL DATA:  Right femoral nail, hip fracture EXAM: DG HIP (WITH OR WITHOUT PELVIS) 1V PORT RIGHT COMPARISON:  None. FINDINGS: The changes of internal fixation across the right femoral intertrochanteric fracture. Near anatomic alignment. The lesser trochanter remains displaced. No subluxation or dislocation. No hardware complicating feature. IMPRESSION: Internal fixation across the intertrochanteric fracture. No complicating feature. Electronically Signed   By: Rolm Baptise M.D.   On: 07/26/2016 12:15   Dg Femur, Min 2 Views Right  Result Date: 07/26/2016 CLINICAL DATA:  Right femur surgery. EXAM: RIGHT FEMUR 2 VIEWS; DG C-ARM 61-120 MIN COMPARISON:  No recent . FINDINGS: It should medullary rod and screw fixation right femur. Hardware intact. Anatomic alignment. Three images obtained. 0 minutes 41 seconds fluoroscopy time utilized. IMPRESSION: Postsurgical change right femur. Hardware intact. Anatomic alignment. Electronically Signed   By: Marcello Moores  Register   On: 07/26/2016 10:57    Medical Consultants:    None.  Anti-Infectives:    None  Subjective:   No concerns today.   Objective:    Vitals:   07/26/16 2357 07/27/16 0410 07/27/16 1124 07/27/16 1145  BP: (!) 136/56 137/63 (!) 90/45 (!) (P) 118/48  Pulse: 95 80 84 (P) 82  Resp: 16 16 18  (P) 20  Temp: 98.2 F (36.8 C) 98.9 F (37.2 C) 97 F (36.1 C) (P) 98.2 F (36.8 C)  TempSrc: Oral Oral Oral (P) Oral  SpO2: 99% 99% 97%   Weight:      Height:        Intake/Output Summary (Last 24 hours) at 07/27/16 1230 Last data filed at 07/27/16 1141  Gross per 24 hour  Intake              668 ml  Output                 0 ml  Net              668 ml   Filed Weights   07/24/16 1734 07/25/16 0015  Weight: 48.5 kg (107 lb) 50.1 kg (110 lb 6.4 oz)   Exam: General exam: In no acute distress. Respiratory system: Good air movement and clear to auscultation. Cardiovascular system: S1 & S2 heard, RRR. No JVD. Gastrointestinal system: Abdomen is nondistended, soft and nontender.  Central nervous system: Alert and oriented. No focal neurological deficits. Extremities: No pedal edema. Skin: No rashes, lesions or ulcers Psychiatry: Judgement and insight appear normal. Mood & affect appropriate.   Data Reviewed:   Labs: Basic Metabolic Panel:  Recent Labs Lab 07/24/16 1946 07/25/16 0530 07/27/16 0820  NA 136 137 133*  K 4.2 4.5 4.1  CL 107 106 100*  CO2 20* 22 27  GLUCOSE 127* 119* 138*  BUN 22* 19 11  CREATININE 1.07* 1.09* 0.99  CALCIUM 8.5* 8.8* 8.1*   Liver Function Tests:  Recent Labs Lab 07/24/16 1946  AST 18  ALT 10*  ALKPHOS 88  BILITOT 0.5  PROT 7.0  ALBUMIN 3.7   CBC:  Recent Labs Lab 07/24/16 1946 07/25/16 0530 07/27/16 0820  WBC 10.5 6.5 5.2  NEUTROABS 9.6*  --   --   HGB 9.7* 9.7* 6.4*  HCT 30.8* 31.9* 20.6*  MCV 86.5 87.2 86.6  PLT 199 251 159   Sepsis Labs:  Recent Labs Lab 07/24/16 1946 07/25/16 0530 07/27/16 0820  WBC 10.5 6.5 5.2   Microbiology Recent Results (from the past 240 hour(s))  MRSA PCR Screening     Status: None   Collection Time: 07/25/16  3:42 PM  Result Value Ref Range Status   MRSA by PCR NEGATIVE NEGATIVE Final    Medications:   . sodium chloride   Intravenous Once  . amiodarone  200 mg Oral Daily  . apixaban  2.5 mg Oral BID  . ARIPiprazole  5 mg Oral Daily  . carvedilol  3.125 mg Oral BID WC  . chlorhexidine  15 mL Mouth Rinse BID  . clonazePAM  0.5 mg Oral BID  . docusate sodium  100 mg Oral BID  . feeding supplement  1 Container Oral TID BM  . hydrALAZINE  25 mg Oral BID  . latanoprost  1 drop Both Eyes  QHS  . mouth rinse  15 mL Mouth Rinse q12n4p  . povidone-iodine  2 application Topical Once   Continuous Infusions: . lactated ringers 50 mL/hr at 07/26/16 2251    Time spent: 25 min   LOS: 3 days   Melissa Mcdaniel  [facility]  Pager [phone] Cell [phone]  Please refer to [email or web address], password [email or web address] to get updated schedule on who will round on this patient,  as hospitalists switch teams weekly. If 7PM-7AM, please contact night-coverage at [email or web address], password [email or web address] for any overnight needs.  07/27/2016, 12:30 PM

## 2016-07-27 NOTE — Progress Notes (Signed)
   Assessment: 1 Day Post-Op  S/P Procedure(s) (LRB): INTRAMEDULLARY (IM) NAIL FEMORAL (Right) by Dr. Ernesta Amble. [name] on 07/26/16  Principal Problem:   Closed right hip fracture, initial encounter [facility]) Active Problems:   Hypertension   A-fib (HCC)   Congestive heart failure (CHF) (HCC)   Fall   Malnutrition of moderate degree  Plan: Advance diet Up with therapy D/C Fluids when tolerating PO  Weight Bearing: Weight Bearing as Tolerated (WBAT) right leg Dressings: Mepilex prn.  VTE prophylaxis: Resume Home Medication: Eliquis 2.5 bid, SCDs, ambulation Dispo: Likely Skilled Nursing Facility/Rehab.  PT eval pending.  Subjective: Patient reports pain as mild. Pain controlled with IV and PO meds.  Tolerating liquids.  Urinating.  +Flatus.  No CP, SOB.  Not yet OOB.  Objective:   VITALS:   Vitals:   07/26/16 2002 07/26/16 2232 07/26/16 2357 07/27/16 0410  BP: (!) 126/38 122/68 (!) 136/56 137/63  Pulse: (!) 102  95 80  Resp: 18  16 16   Temp: 98.4 F (36.9 C)  98.2 F (36.8 C) 98.9 F (37.2 C)  TempSrc: Oral  Oral Oral  SpO2: 96%  99% 99%  Weight:      Height:       CBC Latest Ref Rng & Units 07/25/2016 07/24/2016  WBC 4.0 - 10.5 K/uL 6.5 10.5  Hemoglobin 12.0 - 15.0 g/dL 9.7(L) 9.7(L)  Hematocrit 36.0 - 46.0 % 31.9(L) 30.8(L)  Platelets 150 - 400 K/uL 251 199   BMP Latest Ref Rng & Units 07/25/2016 07/24/2016  Glucose 65 - 99 mg/dL 119(H) 127(H)  BUN 6 - 20 mg/dL 19 22(H)  Creatinine 0.44 - 1.00 mg/dL 1.09(H) 1.07(H)  Sodium 135 - 145 mmol/L 137 136  Potassium 3.5 - 5.1 mmol/L 4.5 4.2  Chloride 101 - 111 mmol/L 106 107  CO2 22 - 32 mmol/L 22 20(L)  Calcium 8.9 - 10.3 mg/dL 8.8(L) 8.5(L)   Intake/Output      01/04 0701 - 01/05 0700 01/05 0701 - 01/06 0700   P.O.     I.V. (mL/kg) 1400 (27.9)    Total Intake(mL/kg) 1400 (27.9)    Urine (mL/kg/hr)     Blood 100 (0.1)    Total Output 100     Net +1300          Urine Occurrence 2 x      Physical Exam: General:  NAD.  Upright in bed Resp: No increased wob Cardio: regular rate and rhythm ABD soft Neurologically intact MSK Right LE: Neurovascularly intact Sensation intact distally Foot warm Dorsiflexion/Plantar flexion intact Incision: dressing C/D/I  Melissa Mcdaniel 07/27/2016, 7:23 AM

## 2016-07-27 NOTE — Evaluation (Signed)
Occupational Therapy Evaluation Patient Details Name: Melissa Mcdaniel MRN: [id number] DOB: 14-Dec-1932 Today's Date: 07/27/2016    History of Present Illness 83 y.o. female admitted to [facility] on 07/24/16 after a fall with R hip fx.  Pt s/p L hip IM NAil on 07/27/15.  Pt with significant PMHx of HTN, Chron's disease, CHF, A-fib, partial removal of large intestine.     Clinical Impression   Pt admitted with above and presents to OT with pain and decreased mobility, impacting independence with ADLs.  Engaged in sit > stand from recliner with min assist progressing to mod assist due to pain and hesitancy.  Completed hygiene in standing, post incontinent BM, with min assist for standing balance with UE support on RW.  Increased time and cues for upright standing and positioning of RLE during transitional movements due to pain.  Stand pivot transfer back to bed due to fatigue, with pt requiring mod multimodal cues for sequencing and increased time.  Pt demonstrating decreased sequencing during transfer back to bed and with bed mobility, requiring increased time and cues.  Pt will benefit from OT services acutely to prepare for d/c listed below.  Pt would like to discharge home, however depending on progress recommending SNF at this time.        Follow Up Recommendations  SNF;Supervision/Assistance - 24 hour    Equipment Recommendations  Other (comment) (TBD)    Recommendations for Other Services       Precautions / Restrictions Precautions Precautions: Fall Restrictions Weight Bearing Restrictions: Yes RLE Weight Bearing: Weight bearing as tolerated      Mobility Bed Mobility Overal bed mobility: Needs Assistance Bed Mobility: Sit to Supine     Supine to sit: Min guard Sit to supine: Mod assist   General bed mobility comments: Pt required assistance to lift RLE into bed and then assistance with positioning in bed.  Pt with extreme pain and fatigue towards end of session, requiring  increased cues  Transfers Overall transfer level: Needs assistance Equipment used: Rolling walker (2 wheeled) Transfers: Sit to/from [facility] Sit to Stand: Min assist Stand pivot transfers: Min assist       General transfer comment: min A from EOB to RW with assistance at trunk and cues for weightbearing through RLE    Balance Overall balance assessment: History of Falls                                          ADL Overall ADL's : Needs assistance/impaired                     Lower Body Dressing: Maximal assistance;Sit to/from stand   Toilet Transfer: Electronics engineer Details (indicate cue type and reason): simulated with stand step transfer recliner to bed.  Pt required only lift lifting into standing, however max cues for sequencing and technique to increase safety as well as encouragement.           Functional mobility during ADLs: Rolling walker;Moderate assistance;Cueing for sequencing General ADL Comments: Pt required only lift lifting into standing, however max cues for sequencing and technique to increase safety as well as encouragement.       Vision Vision Assessment?: No apparent visual deficits          Pertinent Vitals/Pain Pain Assessment: 0-10 Pain Score: 10-Worst pain ever Pain Location: right leg and  hip Pain Descriptors / Indicators: Aching;Burning;Grimacing;Guarding Pain Intervention(s): Limited activity within patient's tolerance;Monitored during session;Repositioned;Patient requesting pain meds-RN notified        Extremity/Trunk Assessment Upper Extremity Assessment Upper Extremity Assessment: Generalized weakness       Cervical / Trunk Assessment Cervical / Trunk Assessment: Kyphotic   Communication Communication Communication: No difficulties   Cognition Arousal/Alertness: Awake/alert Behavior During Therapy: WFL for tasks assessed/performed Overall Cognitive Status:  Within Functional Limits for tasks assessed                 General Comments: improved awareness this session              Home Living Family/patient expects to be discharged to:: Private residence Living Arrangements: Children;Spouse/significant other Available Help at Discharge: Family Type of Home: House Home Access: Stairs to enter Technical brewer of Steps: 2 Entrance Stairs-Rails: Right;Left Home Layout: Two level;Bed/bath upstairs Alternate Level Stairs-Number of Steps: flight (with landing) Alternate Level Stairs-Rails: Right;Left (both half way, then Rt only)           Home Equipment: None          Prior Functioning/Environment Level of Independence: Independent                 OT Problem List: Decreased strength;Decreased range of motion;Decreased activity tolerance;Impaired balance (sitting and/or standing);Decreased safety awareness;Decreased knowledge of use of DME or AE;Pain   OT Treatment/Interventions: Self-care/ADL training;Therapeutic exercise;DME and/or AE instruction;Therapeutic activities;Patient/family education    OT Goals(Current goals can be found in the care plan section) Acute Rehab OT Goals Patient Stated Goal: to decrease pain OT Goal Formulation: With patient Time For Goal Achievement: 08/10/16 Potential to Achieve Goals: Good  OT Frequency: Min 2X/week   Barriers to D/C: Decreased caregiver support  unsure husband can provide necessary physical assistance          End of Session Equipment Utilized During Treatment: Gait belt;Rolling walker Nurse Communication: Patient requests pain meds  Activity Tolerance: Patient limited by pain Patient left: in bed;with call bell/phone within reach;with nursing/sitter in room;with family/visitor present   Time: 1444-1511 OT Time Calculation (min): 27 min Charges:  OT General Charges $OT Visit: 1 Procedure OT Evaluation $OT Eval Moderate Complexity: 1 Procedure OT  Treatments $Self Care/Home Management : 8-22 mins G-CodesSimonne Come, [phone] 07/27/2016, 3:22 PM

## 2016-07-27 NOTE — Progress Notes (Signed)
Physical Therapy Treatment Patient Details Name: Melissa Mcdaniel MRN: [id number] DOB: 1933/07/17 Today's Date: 07/27/2016    History of Present Illness 83 y.o. female admitted to [facility] on 07/24/16 after a fall with R hip fx.  Pt s/p L hip IM NAil on 07/27/15.  Pt with significant PMHx of HTN, Chron's disease, CHF, A-fib, partial removal of large intestine.      PT Comments    Pt is POD 1 and moving better with therapy. Performed bed mobs with min guard and short distance gait to recliner with min A. Pt is having increased difficulty weight bearing through RLE and requires some assistance on right side to improve gait sequencing. Pt would like to return home at discharge with her husband but is not against SNF placement if she is unable to manage steps prior to discharge. Pt and spouse were to discuss.    Follow Up Recommendations  Home health PT;Supervision/Assistance - 24 hour     Equipment Recommendations  Rolling walker with 5" wheels;3in1 (PT);Hospital bed;Other (comment) (hosptial bed to stay downstairs)    Recommendations for Other Services       Precautions / Restrictions Precautions Precautions: Fall Restrictions Weight Bearing Restrictions: Yes RLE Weight Bearing: Weight bearing as tolerated    Mobility  Bed Mobility Overal bed mobility: Needs Assistance Bed Mobility: Supine to Sit     Supine to sit: Min guard     General bed mobility comments: Min guard with HOB flat, pt was able to maneuver LE's with cues to EOB without assistance. Pt wanted to perfrom without assistance.   Transfers Overall transfer level: Needs assistance Equipment used: Rolling walker (2 wheeled) Transfers: Sit to/from Stand Sit to Stand: Min assist         General transfer comment: min A from EOB to RW with assistance at trunk and cues for weightbearing through RLE  Ambulation/Gait Ambulation/Gait assistance: Min assist Ambulation Distance (Feet): 15 Feet Assistive device: Rolling  walker (2 wheeled) Gait Pattern/deviations: Step-to pattern;Decreased stance time - right;Decreased step length - left;Antalgic Gait velocity: decreased Gait velocity interpretation: Below normal speed for age/gender General Gait Details: Mod antalgic gait with cues for sequencing in RW and cues for positioning inside walker   Stairs            Wheelchair Mobility    Modified Rankin (Stroke Patients Only)       Balance Overall balance assessment: History of Falls                                  Cognition Arousal/Alertness: Awake/alert Behavior During Therapy: WFL for tasks assessed/performed Overall Cognitive Status: Within Functional Limits for tasks assessed                 General Comments: improved awareness this session    Exercises Total Joint Exercises Ankle Circles/Pumps: AROM;Both;20 reps;Supine Quad Sets: AROM;Right;10 reps;Supine Heel Slides: AAROM;Right;10 reps;Supine    General Comments        Pertinent Vitals/Pain Pain Assessment: 0-10 Pain Score: 5  Pain Location: right leg and hip Pain Descriptors / Indicators: Aching;Burning;Grimacing;Guarding Pain Intervention(s): Monitored during session;Repositioned;Premedicated before session;Ice applied    Home Living                      Prior Function            PT Goals (current goals can now be found in the care plan  section) Acute Rehab PT Goals Patient Stated Goal: to decrease pain Progress towards PT goals: Progressing toward goals    Frequency    Min 5X/week      PT Plan Current plan remains appropriate    Co-evaluation             End of Session Equipment Utilized During Treatment: Gait belt Activity Tolerance: Patient tolerated treatment well Patient left: in chair;with call bell/phone within reach;with family/visitor present     Time: 7412-8786 PT Time Calculation (min) (ACUTE ONLY): 44 min  Charges:  $Gait Training: 8-22  mins $Therapeutic Exercise: 8-22 mins $Therapeutic Activity: 8-22 mins                    G Codes:      Scheryl Marten PT, DPT  [phone]  07/27/2016, 12:46 PM

## 2016-07-27 NOTE — Progress Notes (Signed)
Phy notified of hgb 6.4 and requested to call #[phone].

## 2016-07-27 NOTE — Clinical Social Work Placement (Addendum)
   CLINICAL SOCIAL WORK PLACEMENT  NOTE  Date:  07/27/2016  Patient Details  Name: Melissa Mcdaniel MRN: [id number] Date of Birth: Aug 26, 1932  Clinical Social Work is seeking post-discharge placement for this patient at the [place] level of care (*CSW will initial, date and re-position this form in  chart as items are completed):      Patient/family provided with [place] Work Department's list of facilities offering this level of care within the geographic area requested by the patient (or if unable, by the patient's family).      Patient/family informed of their freedom to choose among providers that offer the needed level of care, that participate in Medicare, Medicaid or managed care program needed by the patient, have an available bed and are willing to accept the patient.      Patient/family informed of [place]'s ownership interest in [facility] and [facility], as well as of the fact that they are under no obligation to receive care at these facilities.  PASRR submitted to EDS on       PASRR number received on 07/27/16     Existing PASRR number confirmed on       FL2 transmitted to all facilities in geographic area requested by pt/family on 07/27/16     FL2 transmitted to all facilities within larger geographic area on       Patient informed that his/her managed care company has contracts with or will negotiate with certain facilities, including the following:            Patient/family informed of bed offers received.  Patient chooses bed at [facility]     Physician recommends and patient chooses bed at      Patient to be transferred to [facility] on 07/30/2016.  Patient to be transferred to facility by [facility]     Patient family notified on 07/30/16 of transfer.  Name of family member notified:    [name]    PHYSICIAN Please prepare priority discharge summary, including medications, Please prepare prescriptions, Please sign  FL2     Additional Comment:    _______________________________________________ Alla German, LCSW 07/27/2016, 4:22 PM

## 2016-07-27 NOTE — Clinical Social Work Note (Signed)
Clinical Social Work Assessment  Patient Details  Name: Melissa Mcdaniel MRN: [id number] Date of Birth: 10-06-1932  Date of referral:  07/27/16               Reason for consult:  Facility Placement                Permission sought to share information with:  Family Supports Permission granted to share information::     Name::        Agency::     Relationship::     Contact Information:     Housing/Transportation Living arrangements for the past 2 months:  [place] of Information:  Patient Patient Interpreter Needed:  None Criminal Activity/Legal Involvement Pertinent to Current Situation/Hospitalization:  No - Comment as needed Significant Relationships:  Spouse Lives with:  Spouse, Adult Children Do you feel safe going back to the place where you live?  Yes Need for family participation in patient care:  No (Coment)  Care giving concerns:  Pt's spouse present at bedside during initial assessment.    Social Worker assessment / plan:  CSW spoke with pt at bedside to complete initial assessment. Pt is agreeable to SNF placement at this time. Pt believes this to be the safest plan. Pt and pt's husband just moved here from [facility]. Pt and pt's husband live with his son and daughter in law. Pt's husband ask that CSW send the referral to the [place] area as well as [place].  Employment status:  Retired Forensic scientist:  Medicare PT Recommendations:  [place] / Referral to community resources:  [place]  Patient/Family's Response to care:  Pt verbalized understanding of CSW role and appreciation of support. Pt denies any concern regarding pt care at this time.  Patient/Family's Understanding of and Emotional Response to Diagnosis, Current Treatment, and Prognosis:  Pt understanding and realistic regarding physical limitations. Pt understanding need for Snf and feels that this is the safest plan at this  time. Pt is agreeable to SNF placement at this time. Pt denies any concern regarding treatment plan at this time. CSW will continue to provide support.  Emotional Assessment Appearance:  Appears stated age Attitude/Demeanor/Rapport:   (Patient was appropriate.) Affect (typically observed):  Accepting, Appropriate, Calm Orientation:  Oriented to Self, Oriented to Place, Oriented to  Time, Oriented to Situation Alcohol / Substance use:  Not Applicable Psych involvement (Current and /or in the community):  No (Comment)  Discharge Needs  Concerns to be addressed:  No discharge needs identified Readmission within the last 30 days:  No Current discharge risk:  Dependent with Mobility Barriers to Discharge:  Continued Medical Work up   [employer], LCSW 07/27/2016, 4:18 PM

## 2016-07-27 NOTE — NC FL2 (Signed)
[place] MEDICAID FL2 LEVEL OF CARE SCREENING TOOL     IDENTIFICATION  Patient Name: Melissa Mcdaniel Birthdate: 1932-09-09 Sex: female Admission Date (Current Location): 07/24/2016  [facility] and [place] Number:  Herbalist and Address:  [facility] [place], [place] [address] [place], [place] [address]      Provider Number: [id number]  Attending Physician Name and Address:  Theodis Blaze, MD  Relative Name and Phone Number:       Current Level of Care: Hospital Recommended Level of Care: Trooper Prior Approval Number:    Date Approved/Denied: 07/27/16 PASRR Number: [id number]  Discharge Plan: SNF    Current Diagnoses: Patient Active Problem List   Diagnosis Date Noted  . Malnutrition of moderate degree 07/26/2016  . Closed right hip fracture, initial encounter ([place]) 07/24/2016  . Hypertension 07/24/2016  . A-fib ([place]) 07/24/2016  . Fall 07/24/2016  . Congestive heart failure (CHF) ([place]) 05/23/2016    Orientation RESPIRATION BLADDER Height & Weight     Situation, Place, Time, Self  Normal Continent Weight: 110 lb 6.4 oz (50.1 kg) Height:  5' 2"  (157.5 cm)  BEHAVIORAL SYMPTOMS/MOOD NEUROLOGICAL BOWEL NUTRITION STATUS      Continent  (Please see d/c summary)  AMBULATORY STATUS COMMUNICATION OF NEEDS Skin     Verbally Surgical wounds (Closed incision; right hip)                       Personal Care Assistance Level of Assistance  Bathing, Feeding, Dressing           Functional Limitations Info  Sight, Hearing, Speech Sight Info: Adequate Hearing Info: Adequate Speech Info: Adequate    SPECIAL CARE FACTORS FREQUENCY  PT (By licensed PT), OT (By licensed OT)     PT Frequency: 5x week OT Frequency: 5x week            Contractures Contractures Info: Not present    Additional Factors Info  Code Status, Allergies Code Status Info: Full Allergies Info: Penicillins           Current Medications  (07/27/2016):  This is the current hospital active medication list Current Facility-Administered Medications  Medication Dose Route Frequency Provider Last Rate Last Dose  . 0.9 %  sodium chloride infusion   Intravenous Once Theodis Blaze, MD      . acetaminophen (TYLENOL) tablet 650 mg  650 mg Oral Q6H PRN Charna Elizabeth Martensen III, PA-C       Or  . acetaminophen (TYLENOL) suppository 650 mg  650 mg Rectal Q6H PRN Charna Elizabeth Martensen III, PA-C      . amiodarone (PACERONE) tablet 200 mg  200 mg Oral Daily Roney Jaffe, MD   200 mg at 07/27/16 1011  . ARIPiprazole (ABILIFY) tablet 5 mg  5 mg Oral Daily Roney Jaffe, MD   5 mg at 07/27/16 1011  . bisacodyl (DULCOLAX) EC tablet 5 mg  5 mg Oral Daily PRN Roney Jaffe, MD      . carvedilol (COREG) tablet 3.125 mg  3.125 mg Oral BID WC Roney Jaffe, MD   3.125 mg at 07/27/16 1012  . chlorhexidine (PERIDEX) 0.12 % solution 15 mL  15 mL Mouth Rinse BID Roney Jaffe, MD   15 mL at 07/27/16 1012  . clonazePAM (KLONOPIN) tablet 0.5 mg  0.5 mg Oral BID Roney Jaffe, MD   0.5 mg at 07/27/16 1012  . docusate sodium (COLACE) capsule 100 mg  100 mg Oral BID Roney Jaffe, MD   100 mg at 07/27/16 1012  . feeding supplement (BOOST / RESOURCE BREEZE) liquid 1 Container  1 Container Oral TID BM Charlynne Cousins, MD   1 Container at 07/27/16 1302  . HYDROcodone-acetaminophen (NORCO/VICODIN) 5-325 MG per tablet 1-2 tablet  1-2 tablet Oral Q4H PRN Prudencio Burly III, PA-C   2 tablet at 07/27/16 1549  . HYDROmorphone (DILAUDID) injection 0.5-1 mg  0.5-1 mg Intravenous Q3H PRN Charlynne Cousins, MD   1 mg at 07/26/16 1850  . latanoprost (XALATAN) 0.005 % ophthalmic solution 1 drop  1 drop Both Eyes QHS Roney Jaffe, MD   1 drop at 07/26/16 2249  . MEDLINE mouth rinse  15 mL Mouth Rinse q12n4p Roney Jaffe, MD      . metoCLOPramide (REGLAN) tablet 5-10 mg  5-10 mg Oral Q8H PRN Charna Elizabeth Martensen III, PA-C       Or  . metoCLOPramide  (REGLAN) injection 5-10 mg  5-10 mg Intravenous Q8H PRN Charna Elizabeth Martensen III, PA-C      . ondansetron [facility]) injection 4 mg  4 mg Intravenous Q6H PRN Gardiner Barefoot, NP      . polyethylene glycol (MIRALAX / GLYCOLAX) packet 17 g  17 g Oral Daily PRN Roney Jaffe, MD      . povidone-iodine 10 % swab 2 application  2 application Topical Once Prudencio Burly III, PA-C         Discharge Medications: Please see discharge summary for a list of discharge medications.  Relevant Imaging Results:  Relevant Lab Results:   Additional Information SSN: [id number]  Ht: 5' 2"  (1.575 m) Wt: 110 lb 6.4 oz (50.1 kg)  Kassi Esteve A Mayton, LCSW

## 2016-07-28 LAB — TYPE AND SCREEN
BLOOD PRODUCT EXPIRATION DATE: 201801132359
Blood Product Expiration Date: 201801122359
ISSUE DATE / TIME: 201801051104
ISSUE DATE / TIME: 201801051458
UNIT TYPE AND RH: 5100
Unit Type and Rh: 5100

## 2016-07-28 LAB — BASIC METABOLIC PANEL
ANION GAP: 8 (ref 5–15)
BUN: 13 mg/dL (ref 6–20)
CALCIUM: 8.3 mg/dL — AB (ref 8.9–10.3)
CO2: 25 mmol/L (ref 22–32)
CREATININE: 0.94 mg/dL (ref 0.44–1.00)
Chloride: 100 mmol/L — ABNORMAL LOW (ref 101–111)
GFR calc non Af Amer: 55 mL/min — ABNORMAL LOW (ref >60)
Glucose, Bld: 121 mg/dL — ABNORMAL HIGH (ref 65–99)
Potassium: 4.2 mmol/L (ref 3.5–5.1)
SODIUM: 133 mmol/L — AB (ref 135–145)

## 2016-07-28 LAB — CBC
HEMATOCRIT: 27.2 % — AB (ref 36.0–46.0)
HEMOGLOBIN: 8.8 g/dL — AB (ref 12.0–15.0)
MCH: 27.2 pg (ref 26.0–34.0)
MCHC: 32.4 g/dL (ref 30.0–36.0)
MCV: 84 fL (ref 78.0–100.0)
Platelets: 177 10*3/uL (ref 150–400)
RBC: 3.24 MIL/uL — ABNORMAL LOW (ref 3.87–5.11)
RDW: 15.3 % (ref 11.5–15.5)
WBC: 6.7 10*3/uL (ref 4.0–10.5)

## 2016-07-28 MED ORDER — APIXABAN 2.5 MG PO TABS
2.5000 mg | ORAL_TABLET | Freq: Two times a day (BID) | ORAL | Status: DC
  Administered 2016-07-28 – 2016-07-29 (×4): 2.5 mg via ORAL
  Filled 2016-07-28 (×5): qty 1

## 2016-07-28 MED ORDER — HYDRALAZINE HCL 25 MG PO TABS
25.0000 mg | ORAL_TABLET | Freq: Two times a day (BID) | ORAL | Status: DC
  Administered 2016-07-28 – 2016-07-30 (×5): 25 mg via ORAL
  Filled 2016-07-28 (×5): qty 1

## 2016-07-28 NOTE — Progress Notes (Signed)
Subjective: 2 Days Post-Op Procedure(s) (LRB): INTRAMEDULLARY (IM) NAIL FEMORAL (Right) Patient reports pain as mild.    Objective: Vital signs in last 24 hours: Temp:  [97 F (36.1 C)-99.1 F (37.3 C)] 98.6 F (37 C) (01/06 0430) Pulse Rate:  [68-91] 91 (01/06 0430) Resp:  [18-20] 18 (01/06 0430) BP: (90-147)/(45-73) 147/63 (01/06 0430) SpO2:  [97 %-99 %] 99 % (01/06 0430)  Intake/Output from previous day: 01/05 0701 - 01/06 0700 In: 1674 [P.O.:720; Blood:954] Out: -  Intake/Output this shift: No intake/output data recorded.   Recent Labs  07/27/16 0820  HGB 6.4*    Recent Labs  07/27/16 0820  WBC 5.2  RBC 2.38*  HCT 20.6*  PLT 159    Recent Labs  07/27/16 0820  NA 133*  K 4.1  CL 100*  CO2 27  BUN 11  CREATININE 0.99  GLUCOSE 138*  CALCIUM 8.1*   No results for input(s): LABPT, INR in the last 72 hours.  Neurologically intact Neurovascular intact Sensation intact distally Intact pulses distally Dorsiflexion/Plantar flexion intact Incision: dressing C/D/I No cellulitis present Compartment soft  No drainage noted to dressings  Assessment/Plan: 2 Days Post-Op Procedure(s) (LRB): INTRAMEDULLARY (IM) NAIL FEMORAL (Right) Up with therapy  ABLA- asymptomatic-continue plan per medicine WBAT RLE Dry dressing change prn Continue plan per medicine  Fannie Knee 07/28/2016, 8:16 AM

## 2016-07-28 NOTE — Progress Notes (Signed)
Pharmacy - Apixaban consult  A/P: 83 y/o female on apixaban PTA for hx Afib. Patient is s/p repair right hip fracture. Pharmacy consulted to resume apixaban. Renal function is normal. No bleeding noted, Hgb low at 8.8 (received 2 units PRBC on 1/5), platelets are normal.   Apixaban 2.5 mg PO bid - dose ok for age and weight Pharmacy signing off but will follow peripherally  Renold Genta, PharmD, BCPS Clinical Pharmacist Phone for today - [place] - [phone] 07/28/2016 3:20 PM

## 2016-07-28 NOTE — Clinical Social Work Note (Signed)
Clinical Social Worker continuing to follow patient and family for support and discharge planning needs.  CSW spoke with patient, patient spouse, and patient daughter at bedside to provide bed offers.  Patient daughter and spouse would like these facilities in this order - [facility], [facility], [address] [place].  CSW left message with [facility] as their bed offer remains pending.  Patient family aware and hopeful for bed offer.  CSW remains available for support and to facilitate patient discharge needs once medically stable.  Barbette Or, [place]

## 2016-07-28 NOTE — Progress Notes (Signed)
[facility] PROGRESS NOTE    Progress Note   Melissa Mcdaniel  YDX:[id number] DOB: February 05, 1933 DOA: 07/24/2016   PCP: [name]   Brief Narrative:   83 y.o. female with history of hypertension, heart failure atrial fibrillation on Eluquis Fell nn her right side and came into the hospital with a right intratrochanteric fracture.  Assessment/Plan:   Clinical history right hip fracture, initial encounter: - continue to hold Eluquis, orthopedic surgery has been consulted - s/p hip repair, post op day #2 - post op anemia, transfused two U PRBC 1/5 and HG up appropriately - WBAT  Post op anemia - Hg down to 6.4, transfused two U PRBC --> Hg up to 8.8 post transfusion  - CBC in AM  Chronic atrial fibrillation: - Continue amiodarone and Coreg  - resume Eliquis   Essential Hypertension - SBP in 140s, continue Coreg - may resume home regimen Hydralazine today and monitor closely   Congestive heart failure (CHF) (HCC) - Indeterminate whether systolic or diastolic.  - Continue Coreg and ACE inhibitor. - weight trend since admission 107 --> 110 lbs this AM  Mechanical Fall - will need SNF, placement in progress  Anxiety, depression  - Abilify and Klonopin.   Moderate PCM - appreciate nutritionist recommendations   DVT prophylaxis: Eluquis Family Communication: husband at bedside  Disposition Plan/Barrier to D/C: when Hg stable, please note that Eliquis was held yesterday and will resume today, we need to monitor Hg on Eliquis and if stable, can likely be discharged. Also pt's home medication Hydralazine started today so will need close monitoring of BP stability.  Code status: Full   IV Access:    Peripheral IV  Procedures and diagnostic studies:   Dg C-arm 1-60 Min  Result Date: 07/26/2016 CLINICAL DATA:  Right femur surgery. EXAM: RIGHT FEMUR 2 VIEWS; DG C-ARM 61-120 MIN COMPARISON:  No recent . FINDINGS: It should medullary rod  and screw fixation right femur. Hardware intact. Anatomic alignment. Three images obtained. 0 minutes 41 seconds fluoroscopy time utilized. IMPRESSION: Postsurgical change right femur. Hardware intact. Anatomic alignment. Electronically Signed   By: Marcello Moores  Register   On: 07/26/2016 10:57   Dg Hip Port Unilat With Pelvis 1v Right  Result Date: 07/26/2016 CLINICAL DATA:  Right femoral nail, hip fracture EXAM: DG HIP (WITH OR WITHOUT PELVIS) 1V PORT RIGHT COMPARISON:  None. FINDINGS: The changes of internal fixation across the right femoral intertrochanteric fracture. Near anatomic alignment. The lesser trochanter remains displaced. No subluxation or dislocation. No hardware complicating feature. IMPRESSION: Internal fixation across the intertrochanteric fracture. No complicating feature. Electronically Signed   By: Rolm Baptise M.D.   On: 07/26/2016 12:15   Dg Femur, Min 2 Views Right  Result Date: 07/26/2016 CLINICAL DATA:  Right femur surgery. EXAM: RIGHT FEMUR 2 VIEWS; DG C-ARM 61-120 MIN COMPARISON:  No recent . FINDINGS: It should medullary rod and screw fixation right femur. Hardware intact. Anatomic alignment. Three images obtained. 0 minutes 41 seconds fluoroscopy time utilized. IMPRESSION: Postsurgical change right femur. Hardware intact. Anatomic alignment. Electronically Signed   By: Marcello Moores  Register   On: 07/26/2016 10:57    Medical Consultants:    None.  Anti-Infectives:    None  Subjective:   No concerns today.   Objective:    Vitals:   07/27/16 1515 07/27/16 1900 07/27/16 2006 07/28/16 0430  BP: (!) 142/60 (!) 144/58 132/73 (!) 147/63  Pulse: 68 72 88 91  Resp: 20 20 20  18  Temp: 98 F (36.7 C) 98.2 F (36.8 C) 99.1 F (37.3 C) 98.6 F (37 C)  TempSrc: Oral Oral Oral Oral  SpO2: 99%  99% 99%  Weight:      Height:        Intake/Output Summary (Last 24 hours) at 07/28/16 0921 Last data filed at 07/27/16 1900  Gross per 24 hour  Intake             1554 ml    Output                0 ml  Net             1554 ml   Filed Weights   07/24/16 1734 07/25/16 0015  Weight: 48.5 kg (107 lb) 50.1 kg (110 lb 6.4 oz)   Exam: General exam: In no acute distress. Respiratory system: Good air movement and clear to auscultation. Cardiovascular system: S1 & S2 heard, RRR. No JVD. Gastrointestinal system: Abdomen is nondistended, soft and nontender.  Central nervous system: Alert and oriented. No focal neurological deficits. Extremities: No pedal edema. Skin: No rashes, lesions or ulcers Psychiatry: Judgement and insight appear normal. Mood & affect appropriate.   Data Reviewed:   Labs: Basic Metabolic Panel:  Recent Labs Lab 07/24/16 1946 07/25/16 0530 07/27/16 0820 07/28/16 0658  NA 136 137 133* 133*  K 4.2 4.5 4.1 4.2  CL 107 106 100* 100*  CO2 20* 22 27 25   GLUCOSE 127* 119* 138* 121*  BUN 22* 19 11 13   CREATININE 1.07* 1.09* 0.99 0.94  CALCIUM 8.5* 8.8* 8.1* 8.3*   Liver Function Tests:  Recent Labs Lab 07/24/16 1946  AST 18  ALT 10*  ALKPHOS 88  BILITOT 0.5  PROT 7.0  ALBUMIN 3.7   CBC:  Recent Labs Lab 07/24/16 1946 07/25/16 0530 07/27/16 0820 07/28/16 0658  WBC 10.5 6.5 5.2 6.7  NEUTROABS 9.6*  --   --   --   HGB 9.7* 9.7* 6.4* 8.8*  HCT 30.8* 31.9* 20.6* 27.2*  MCV 86.5 87.2 86.6 84.0  PLT 199 251 159 177   Sepsis Labs:  Recent Labs Lab 07/24/16 1946 07/25/16 0530 07/27/16 0820 07/28/16 0658  WBC 10.5 6.5 5.2 6.7   Microbiology Recent Results (from the past 240 hour(s))  MRSA PCR Screening     Status: None   Collection Time: 07/25/16  3:42 PM  Result Value Ref Range Status   MRSA by PCR NEGATIVE NEGATIVE Final    Medications:   . amiodarone  200 mg Oral Daily  . ARIPiprazole  5 mg Oral Daily  . carvedilol  3.125 mg Oral BID WC  . chlorhexidine  15 mL Mouth Rinse BID  . clonazePAM  0.5 mg Oral BID  . docusate sodium  100 mg Oral BID  . feeding supplement  1 Container Oral TID BM  .  latanoprost  1 drop Both Eyes QHS  . mouth rinse  15 mL Mouth Rinse q12n4p  . povidone-iodine  2 application Topical Once   Continuous Infusions:   Time spent: 25 min   LOS: 4 days   Melissa Mcdaniel  [facility]  Pager [phone] Cell [phone]  Please refer to [email or web address], password [email or web address] to get updated schedule on who will round on this patient, as hospitalists switch teams weekly. If 7PM-7AM, please contact night-coverage at [email or web address], password [email or web address] for any overnight needs.  07/28/2016, 9:21 AM

## 2016-07-28 NOTE — Progress Notes (Signed)
Physical Therapy Treatment Patient Details Name: Melissa Mcdaniel MRN: [id number] DOB: [birth year] Today's Date: 07/28/2016    History of Present Illness 83 y.o. female admitted [facility] on 07/24/16 after a fall with R hip fx.  Pt s/p L hip IM NAil on 07/27/15.  Pt with significant PMHx of HTN, Chron's disease, CHF, A-fib, partial removal of large intestine.      PT Comments    Pt is POD 2 and having increased pain limiting her from perform gait this session. Attempted short distance gait from recliner. Pt is able to ambulate 5' forward and 2 steps backward before having increased pain and having to sit back into recliner. Pt and family have decided that SNF placement is the best option at this time and this clinician agrees as it will maximize her functional outcomes.    Follow Up Recommendations  SNF;Supervision/Assistance - 24 hour     Equipment Recommendations  None recommended by PT;Other (comment) (defer to next venue)    Recommendations for Other Services       Precautions / Restrictions Precautions Precautions: Fall Restrictions Weight Bearing Restrictions: Yes RLE Weight Bearing: Weight bearing as tolerated    Mobility  Bed Mobility Overal bed mobility: Needs Assistance Bed Mobility: Supine to Sit     Supine to sit: Mod assist     General bed mobility comments: sitting up in recliner when PT arrives  Transfers Overall transfer level: Needs assistance Equipment used: Rolling walker (2 wheeled) Transfers: Sit to/from Stand Sit to Stand: Mod assist Stand pivot transfers: Max assist       General transfer comment: Mod a from recliner with assistance at trunk  Ambulation/Gait Ambulation/Gait assistance: Mod assist Ambulation Distance (Feet): 5 Feet Assistive device: Rolling walker (2 wheeled) Gait Pattern/deviations: Step-to pattern;Decreased stance time - right;Decreased step length - left;Antalgic Gait velocity: decreased Gait velocity interpretation: Below  normal speed for age/gender General Gait Details: Mod antalgic gait, continues to have difficulty initiating toe off on RLE to initiate swing.    Stairs            Wheelchair Mobility    Modified Rankin (Stroke Patients Only)       Balance                                    Cognition Arousal/Alertness: Awake/alert Behavior During Therapy: WFL for tasks assessed/performed Overall Cognitive Status: Within Functional Limits for tasks assessed                      Exercises Total Joint Exercises Ankle Circles/Pumps: AROM;Both;20 reps;Supine Quad Sets: AROM;Right;10 reps;Supine    General Comments        Pertinent Vitals/Pain Pain Assessment: 0-10 Pain Score: 10-Worst pain ever Pain Location: right leg and hip Pain Descriptors / Indicators: Aching Pain Intervention(s): Limited activity within patient's tolerance;Monitored during session;Ice applied;Repositioned    Home Living                      Prior Function            PT Goals (current goals can now be found in the care plan section) Acute Rehab PT Goals Patient Stated Goal: to decrease pain Progress towards PT goals: Progressing toward goals    Frequency    Min 3X/week      PT Plan Frequency needs to be updated    Co-evaluation  End of Session Equipment Utilized During Treatment: Gait belt Activity Tolerance: Patient limited by pain Patient left: in chair;with call bell/phone within reach;with family/visitor present     Time: 9980-6999 PT Time Calculation (min) (ACUTE ONLY): 23 min  Charges:  $Gait Training: 8-22 mins $Therapeutic Activity: 8-22 mins                    G Codes:      Scheryl Marten PT, DPT  [phone]  07/28/2016, 5:25 PM

## 2016-07-28 NOTE — Progress Notes (Signed)
Occupational Therapy Treatment Patient Details Name: Melissa Mcdaniel MRN: [id number] DOB: 1933-01-22 Today's Date: 07/28/2016    History of present illness 83 y.o. female admitted to [facility] on 07/24/16 after a fall with R hip fx.  Pt s/p L hip IM NAil on 07/27/15.  Pt with significant PMHx of HTN, Chron's disease, CHF, A-fib, partial removal of large intestine.     OT comments  Pt. Remains limited by c/o of pain. Moaning and yelling throughout session.  Requires max encouragement for participation.  Able to complete bed mobility and stand pivot transfer with max a.  Will continue to follow acutely.  D/c to snf likely for continued therapy prior to home.    Follow Up Recommendations  SNF;Supervision/Assistance - 24 hour    Equipment Recommendations       Recommendations for Other Services      Precautions / Restrictions Precautions Precautions: Fall Restrictions RLE Weight Bearing: Weight bearing as tolerated       Mobility Bed Mobility Overal bed mobility: Needs Assistance Bed Mobility: Supine to Sit     Supine to sit: Mod assist     General bed mobility comments: max a to guide B les initially then states "cant i just do it by myself" then pt. was able to guide her own b les oob and pull on bed rails while i used pads with max a to guide hips to eob  Transfers Overall transfer level: Needs assistance Equipment used: Rolling walker (2 wheeled) Transfers: Sit to/from [facility] Sit to Stand: Max assist Stand pivot transfers: Max assist       General transfer comment: pt. would not initiate any weightbearing through RLE with max cues, leaning on RW and moving B UES to hold all of her weight. max encouragement to move hand back in proper position with upright posture so she wasnt "hanging' on the walker. moaning and screaming in pain. "i cant i cant". only pivoting and moving the walker but not moving the righ leg or foot at all.  barely made the pivot to chair  and required max a to guide hips to the chair and attempt a controlled desecend as she was not initially close enough to the chair    Balance                                   ADL Overall ADL's : Needs assistance/impaired                         Toilet Transfer: Maximal assistance;RW;Ambulation Toilet Transfer Details (indicate cue type and reason): simulated from eob to recliner  Toileting- Clothing Manipulation and Hygiene: Maximal assistance Toileting - Clothing Manipulation Details (indicate cue type and reason): simulated during transfer     Functional mobility during ADLs: Maximal assistance;Rolling walker General ADL Comments: unable to weightbear through RLE with max encouragement and cues      Vision                     Perception     Praxis      Cognition   Behavior During Therapy: [facility] for tasks assessed/performed Overall Cognitive Status: Within Functional Limits for tasks assessed                       Extremity/Trunk Assessment  Exercises     Shoulder Instructions       General Comments      Pertinent Vitals/ Pain       Pain Assessment: 0-10 Pain Score: 10-Worst pain ever Pain Location: right leg and hip Pain Descriptors / Indicators: Aching Pain Intervention(s): Limited activity within patient's tolerance;Monitored during session;Repositioned;Patient requesting pain meds-RN notified;Ice applied  Home Living                                          Prior Functioning/Environment              Frequency  Min 2X/week        Progress Toward Goals  OT Goals(current goals can now be found in the care plan section)  Progress towards OT goals: Progressing toward goals     Plan Discharge plan remains appropriate    Co-evaluation                 End of Session Equipment Utilized During Treatment: Rolling walker   Activity Tolerance Patient limited by  pain   Patient Left in chair;with call bell/phone within reach;with family/visitor present;with nursing/sitter in room (SW meeting with family at end of session)   Nurse Communication Patient requests pain meds        Time: 1350-1418 OT Time Calculation (min): 28 min  Charges: OT General Charges $OT Visit: 1 Procedure OT Treatments $Self Care/Home Management : 23-37 mins  Janice Coffin, COTA/L 07/28/2016, 2:37 PM

## 2016-07-29 ENCOUNTER — Encounter (HOSPITAL_COMMUNITY): Payer: Self-pay

## 2016-07-29 LAB — CBC
HCT: 28.9 % — ABNORMAL LOW (ref 36.0–46.0)
Hemoglobin: 9.2 g/dL — ABNORMAL LOW (ref 12.0–15.0)
MCH: 27.4 pg (ref 26.0–34.0)
MCHC: 31.8 g/dL (ref 30.0–36.0)
MCV: 86 fL (ref 78.0–100.0)
Platelets: 227 10*3/uL (ref 150–400)
RBC: 3.36 MIL/uL — ABNORMAL LOW (ref 3.87–5.11)
RDW: 15.8 % — AB (ref 11.5–15.5)
WBC: 6 10*3/uL (ref 4.0–10.5)

## 2016-07-29 LAB — BASIC METABOLIC PANEL
Anion gap: 8 (ref 5–15)
BUN: 15 mg/dL (ref 6–20)
CO2: 28 mmol/L (ref 22–32)
Calcium: 8.5 mg/dL — ABNORMAL LOW (ref 8.9–10.3)
Chloride: 101 mmol/L (ref 101–111)
Creatinine, Ser: 1.03 mg/dL — ABNORMAL HIGH (ref 0.44–1.00)
GFR calc Af Amer: 57 mL/min — ABNORMAL LOW (ref >60)
GFR, EST NON AFRICAN AMERICAN: 50 mL/min — AB (ref >60)
GLUCOSE: 160 mg/dL — AB (ref 65–99)
POTASSIUM: 3.5 mmol/L (ref 3.5–5.1)
Sodium: 137 mmol/L (ref 135–145)

## 2016-07-29 NOTE — Progress Notes (Signed)
Subjective: 3 Days Post-Op Procedure(s) (LRB): INTRAMEDULLARY (IM) NAIL FEMORAL (Right) Patient reports pain as mild.  Patient resting comfortably  Objective: Vital signs in last 24 hours: Temp:  [98.4 F (36.9 C)-99.7 F (37.6 C)] 99.2 F (37.3 C) (01/07 0553) Pulse Rate:  [73-127] 73 (01/07 0553) Resp:  [18] 18 (01/07 0553) BP: (128-147)/(65-73) 147/65 (01/07 0553) SpO2:  [95 %-99 %] 95 % (01/07 0553)  Intake/Output from previous day: 01/06 0701 - 01/07 0700 In: 840 [P.O.:840] Out: -  Intake/Output this shift: No intake/output data recorded.   Recent Labs  07/27/16 0820 07/28/16 0658 07/29/16 0359  HGB 6.4* 8.8* 9.2*    Recent Labs  07/28/16 0658 07/29/16 0359  WBC 6.7 6.0  RBC 3.24* 3.36*  HCT 27.2* 28.9*  PLT 177 227    Recent Labs  07/28/16 0658 07/29/16 0359  NA 133* 137  K 4.2 3.5  CL 100* 101  CO2 25 28  BUN 13 15  CREATININE 0.94 1.03*  GLUCOSE 121* 160*  CALCIUM 8.3* 8.5*   No results for input(s): LABPT, INR in the last 72 hours.  Neurologically intact Neurovascular intact Sensation intact distally Intact pulses distally Dorsiflexion/Plantar flexion intact Incision: dressing C/D/I No cellulitis present Compartment soft  Assessment/Plan: 3 Days Post-Op Procedure(s) (LRB): INTRAMEDULLARY (IM) NAIL FEMORAL (Right) Advance diet Up with therapy  WBAT RLE ABLA-mild and stable Dry dressing change prn Continue plan per medicine  Fannie Knee 07/29/2016, 8:09 AM

## 2016-07-29 NOTE — Progress Notes (Addendum)
[facility] PROGRESS NOTE    Progress Note   Melissa Mcdaniel  RCV:[id number] DOB: Dec 09, 1932 DOA: 07/24/2016   PCP: [name]   Brief Narrative:   83 y.o. female with history of hypertension, heart failure atrial fibrillation on Eluquis Fell nn her right side and came into the hospital with a right intratrochanteric fracture.  Assessment/Plan:   Clinical history right hip fracture, initial encounter: - continue to hold Eluquis, orthopedic surgery has been consulted - s/p hip repair, post op day #3 - post op anemia, transfused two U PRBC 1/5 and Hg up appropriately - WBAT  Post op anemia - Hg down to 6.4, transfused two U PRBC --> Hg up to 8.8 post transfusion --> 9.2 this am - CBC in AM  Chronic atrial fibrillation: - Continue amiodarone and Coreg  - continue Eliquis   Essential Hypertension - SBP in 140s, continue Coreg, Hydralazine   Congestive heart failure (CHF) (HCC) - Indeterminate whether systolic or diastolic.  - Continue Coreg and ACE inhibitor. - weight trend since admission 107 --> 110 lbs this AM  Mechanical Fall - will need SNF, placement in progress  Anxiety, depression  - Abilify and Klonopin.   Moderate PCM - appreciate nutritionist recommendations   DVT prophylaxis: Eluquis Family Communication: no family at bedside this am Disposition Plan/Barrier to D/C: when Hg stable, possibly in am, will go to SNF Code status: Full   IV Access:    Peripheral IV  Medical Consultants:    Ortho  PT  Anti-Infectives:    None  Subjective:   No concerns today.   Objective:    Vitals:   07/28/16 0430 07/28/16 1528 07/28/16 1958 07/29/16 0553  BP: (!) 147/63 128/73 140/67 (!) 147/65  Pulse: 91 95 (!) 127 73  Resp: 18 18  18   Temp: 98.6 F (37 C) 98.4 F (36.9 C) 99.7 F (37.6 C) 99.2 F (37.3 C)  TempSrc: Oral Oral Oral Oral  SpO2: 99% 99% 96% 95%  Weight:      Height:        Intake/Output  Summary (Last 24 hours) at 07/29/16 1143 Last data filed at 07/29/16 1025  Gross per 24 hour  Intake              660 ml  Output                0 ml  Net              660 ml   Filed Weights   07/24/16 1734 07/25/16 0015  Weight: 48.5 kg (107 lb) 50.1 kg (110 lb 6.4 oz)   Exam: General exam: In no acute distress. Respiratory system: Good air movement and clear to auscultation. Cardiovascular system: S1 & S2 heard, RRR. No JVD. Gastrointestinal system: Abdomen is nondistended, soft and nontender.  Central nervous system: Alert and oriented. No focal neurological deficits. Extremities: No pedal edema. Skin: No rashes, lesions or ulcers Psychiatry: Judgement and insight appear normal. Mood & affect appropriate.   Data Reviewed:   Labs: Basic Metabolic Panel:  Recent Labs Lab 07/24/16 1946 07/25/16 0530 07/27/16 0820 07/28/16 0658 07/29/16 0359  NA 136 137 133* 133* 137  K 4.2 4.5 4.1 4.2 3.5  CL 107 106 100* 100* 101  CO2 20* 22 27 25 28   GLUCOSE 127* 119* 138* 121* 160*  BUN 22* 19 11 13 15   CREATININE 1.07* 1.09* 0.99 0.94 1.03*  CALCIUM 8.5* 8.8* 8.1* 8.3* 8.5*  Liver Function Tests:  Recent Labs Lab 07/24/16 1946  AST 18  ALT 10*  ALKPHOS 88  BILITOT 0.5  PROT 7.0  ALBUMIN 3.7   CBC:  Recent Labs Lab 07/24/16 1946 07/25/16 0530 07/27/16 0820 07/28/16 0658 07/29/16 0359  WBC 10.5 6.5 5.2 6.7 6.0  NEUTROABS 9.6*  --   --   --   --   HGB 9.7* 9.7* 6.4* 8.8* 9.2*  HCT 30.8* 31.9* 20.6* 27.2* 28.9*  MCV 86.5 87.2 86.6 84.0 86.0  PLT 199 251 159 177 227   Sepsis Labs:  Recent Labs Lab 07/25/16 0530 07/27/16 0820 07/28/16 0658 07/29/16 0359  WBC 6.5 5.2 6.7 6.0   Microbiology Recent Results (from the past 240 hour(s))  MRSA PCR Screening     Status: None   Collection Time: 07/25/16  3:42 PM  Result Value Ref Range Status   MRSA by PCR NEGATIVE NEGATIVE Final    Medications:   . amiodarone  200 mg Oral Daily  . apixaban  2.5 mg Oral  BID  . ARIPiprazole  5 mg Oral Daily  . carvedilol  3.125 mg Oral BID WC  . chlorhexidine  15 mL Mouth Rinse BID  . clonazePAM  0.5 mg Oral BID  . docusate sodium  100 mg Oral BID  . feeding supplement  1 Container Oral TID BM  . hydrALAZINE  25 mg Oral BID  . latanoprost  1 drop Both Eyes QHS  . mouth rinse  15 mL Mouth Rinse q12n4p  . povidone-iodine  2 application Topical Once   Continuous Infusions:   Time spent: 25 min   LOS: 5 days   [name]  [facility]  Pager [phone] Cell [phone]  Please refer to [email or web address], password [email or web address] to get updated schedule on who will round on this patient, as hospitalists switch teams weekly. If 7PM-7AM, please contact night-coverage at [email or web address], password [email or web address] for any overnight needs.  07/29/2016, 11:43 AM

## 2016-07-29 NOTE — Progress Notes (Signed)
Pt has been resting in bed most of shift. In chair for brief time. Pt states she doesn't understand why she is having pain. Explained that she will have pain but we medicated her as ordered to help relieve.pain.  Meds taken without difficulty. Husband has been present thru the day.

## 2016-07-30 DIAGNOSIS — R2689 Other abnormalities of gait and mobility: Secondary | ICD-10-CM | POA: Diagnosis not present

## 2016-07-30 DIAGNOSIS — R262 Difficulty in walking, not elsewhere classified: Secondary | ICD-10-CM | POA: Diagnosis not present

## 2016-07-30 DIAGNOSIS — W19XXXA Unspecified fall, initial encounter: Secondary | ICD-10-CM | POA: Diagnosis not present

## 2016-07-30 DIAGNOSIS — K5090 Crohn's disease, unspecified, without complications: Secondary | ICD-10-CM | POA: Diagnosis not present

## 2016-07-30 DIAGNOSIS — D509 Iron deficiency anemia, unspecified: Secondary | ICD-10-CM | POA: Diagnosis not present

## 2016-07-30 DIAGNOSIS — Z7901 Long term (current) use of anticoagulants: Secondary | ICD-10-CM | POA: Diagnosis not present

## 2016-07-30 DIAGNOSIS — M25551 Pain in right hip: Secondary | ICD-10-CM | POA: Diagnosis not present

## 2016-07-30 DIAGNOSIS — S72001D Fracture of unspecified part of neck of right femur, subsequent encounter for closed fracture with routine healing: Secondary | ICD-10-CM | POA: Diagnosis not present

## 2016-07-30 DIAGNOSIS — E440 Moderate protein-calorie malnutrition: Secondary | ICD-10-CM | POA: Diagnosis not present

## 2016-07-30 DIAGNOSIS — S72009A Fracture of unspecified part of neck of unspecified femur, initial encounter for closed fracture: Secondary | ICD-10-CM | POA: Diagnosis not present

## 2016-07-30 DIAGNOSIS — S72001A Fracture of unspecified part of neck of right femur, initial encounter for closed fracture: Secondary | ICD-10-CM | POA: Diagnosis not present

## 2016-07-30 DIAGNOSIS — K5000 Crohn's disease of small intestine without complications: Secondary | ICD-10-CM | POA: Diagnosis not present

## 2016-07-30 DIAGNOSIS — I482 Chronic atrial fibrillation: Secondary | ICD-10-CM | POA: Diagnosis not present

## 2016-07-30 DIAGNOSIS — R197 Diarrhea, unspecified: Secondary | ICD-10-CM | POA: Diagnosis not present

## 2016-07-30 DIAGNOSIS — I110 Hypertensive heart disease with heart failure: Secondary | ICD-10-CM | POA: Diagnosis not present

## 2016-07-30 DIAGNOSIS — R000 Tachycardia, unspecified: Secondary | ICD-10-CM | POA: Diagnosis not present

## 2016-07-30 DIAGNOSIS — R269 Unspecified abnormalities of gait and mobility: Secondary | ICD-10-CM | POA: Diagnosis not present

## 2016-07-30 DIAGNOSIS — I4891 Unspecified atrial fibrillation: Secondary | ICD-10-CM | POA: Diagnosis not present

## 2016-07-30 DIAGNOSIS — S7291XD Unspecified fracture of right femur, subsequent encounter for closed fracture with routine healing: Secondary | ICD-10-CM | POA: Diagnosis not present

## 2016-07-30 DIAGNOSIS — M79609 Pain in unspecified limb: Secondary | ICD-10-CM | POA: Diagnosis not present

## 2016-07-30 DIAGNOSIS — D62 Acute posthemorrhagic anemia: Secondary | ICD-10-CM | POA: Diagnosis not present

## 2016-07-30 DIAGNOSIS — Z9181 History of falling: Secondary | ICD-10-CM | POA: Diagnosis not present

## 2016-07-30 DIAGNOSIS — S72141D Displaced intertrochanteric fracture of right femur, subsequent encounter for closed fracture with routine healing: Secondary | ICD-10-CM | POA: Diagnosis not present

## 2016-07-30 DIAGNOSIS — D649 Anemia, unspecified: Secondary | ICD-10-CM | POA: Diagnosis not present

## 2016-07-30 DIAGNOSIS — I509 Heart failure, unspecified: Secondary | ICD-10-CM | POA: Diagnosis not present

## 2016-07-30 DIAGNOSIS — J811 Chronic pulmonary edema: Secondary | ICD-10-CM | POA: Diagnosis not present

## 2016-07-30 DIAGNOSIS — I10 Essential (primary) hypertension: Secondary | ICD-10-CM | POA: Diagnosis not present

## 2016-07-30 LAB — BASIC METABOLIC PANEL
ANION GAP: 9 (ref 5–15)
BUN: 15 mg/dL (ref 6–20)
CALCIUM: 8.6 mg/dL — AB (ref 8.9–10.3)
CO2: 27 mmol/L (ref 22–32)
Chloride: 100 mmol/L — ABNORMAL LOW (ref 101–111)
Creatinine, Ser: 0.94 mg/dL (ref 0.44–1.00)
GFR calc non Af Amer: 55 mL/min — ABNORMAL LOW (ref >60)
Glucose, Bld: 149 mg/dL — ABNORMAL HIGH (ref 65–99)
Potassium: 3.8 mmol/L (ref 3.5–5.1)
SODIUM: 136 mmol/L (ref 135–145)

## 2016-07-30 LAB — CBC
HEMATOCRIT: 31 % — AB (ref 36.0–46.0)
HEMOGLOBIN: 9.7 g/dL — AB (ref 12.0–15.0)
MCH: 27.3 pg (ref 26.0–34.0)
MCHC: 31.3 g/dL (ref 30.0–36.0)
MCV: 87.3 fL (ref 78.0–100.0)
Platelets: 267 10*3/uL (ref 150–400)
RBC: 3.55 MIL/uL — ABNORMAL LOW (ref 3.87–5.11)
RDW: 15.6 % — AB (ref 11.5–15.5)
WBC: 5.3 10*3/uL (ref 4.0–10.5)

## 2016-07-30 MED ORDER — CLONAZEPAM 0.5 MG PO TABS: ORAL_TABLET | ORAL | 0 refills | Status: DC

## 2016-07-30 NOTE — Progress Notes (Signed)
   Assessment: 4 Days Post-Op  S/P Procedure(s) (LRB): INTRAMEDULLARY (IM) NAIL FEMORAL (Right) by Dr. Ernesta Amble. Murphy on 07/26/16  Principal Problem:   Closed right hip fracture, initial encounter [facility]) Active Problems:   Hypertension   A-fib (HCC)   Congestive heart failure (CHF) (HCC)   Fall   Malnutrition of moderate degree ABLA: stable  Plan: Up with therapy Follow up with Dr. Alain Marion 2 weeks post op.  Weight Bearing: Weight Bearing as Tolerated (WBAT)  Dressings: Mepilex.  VTE prophylaxis: Eliquis, SCDs, ambulation Dispo: Skilled Nursing Facility/Rehab  Subjective: Patient reports pain as mild. Pain controlled with PO meds.  Tolerating diet - appetite decreased.  Urinating.  +Flatus.  No CP, SOB.  OOB in room.  Objective:   VITALS:   Vitals:   07/29/16 0553 07/29/16 1319 07/29/16 1939 07/30/16 0533  BP: (!) 147/65 (!) 111/56 (!) 147/72 (!) 144/49  Pulse: 73 (!) 123 84 (!) 115  Resp: 18 16 16 16   Temp: 99.2 F (37.3 C) 97.6 F (36.4 C) 98 F (36.7 C) 97.9 F (36.6 C)  TempSrc: Oral  Oral Oral  SpO2: 95% 99% 98% 99%  Weight:   53.6 kg (118 lb 2.7 oz)   Height:       CBC Latest Ref Rng & Units 07/29/2016 07/28/2016 07/27/2016  WBC 4.0 - 10.5 K/uL 6.0 6.7 5.2  Hemoglobin 12.0 - 15.0 g/dL 9.2(L) 8.8(L) 6.4(LL)  Hematocrit 36.0 - 46.0 % 28.9(L) 27.2(L) 20.6(L)  Platelets 150 - 400 K/uL 227 177 159   BMP Latest Ref Rng & Units 07/29/2016 07/28/2016 07/27/2016  Glucose 65 - 99 mg/dL 160(H) 121(H) 138(H)  BUN 6 - 20 mg/dL 15 13 11   Creatinine 0.44 - 1.00 mg/dL 1.03(H) 0.94 0.99  Sodium 135 - 145 mmol/L 137 133(L) 133(L)  Potassium 3.5 - 5.1 mmol/L 3.5 4.2 4.1  Chloride 101 - 111 mmol/L 101 100(L) 100(L)  CO2 22 - 32 mmol/L 28 25 27   Calcium 8.9 - 10.3 mg/dL 8.5(L) 8.3(L) 8.1(L)   Intake/Output      01/07 0701 - 01/08 0700   P.O. 180   Total Intake(mL/kg) 180 (3.4)   Net +180       Urine Occurrence 8 x   Stool Occurrence 2 x     Physical Exam: General: NAD.   Husband at bedside Resp: No increased wob Neurologically intact, conversant MSK Neurovascularly intact Sensation intact distally Intact pulses distally Dorsiflexion/Plantar flexion intact Incision: dressing C/D/I - lower dressing wrinkled   Charna Elizabeth Martensen III 07/30/2016, 6:37 AM

## 2016-07-30 NOTE — Progress Notes (Signed)
Physical Therapy Treatment Patient Details Name: Melissa Mcdaniel MRN: [id number] DOB: [birth year] Today's Date: 07/30/2016    History of Present Illness 83 y.o. female admitted to [facility] on 07/24/16 after a fall with R hip fx.  Pt s/p L hip IM NAil on 07/27/15.  Pt with significant PMHx of HTN, Chron's disease, CHF, A-fib, partial removal of large intestine.      PT Comments    Pt agreeable to therapy today prior to d/c to SNF facility. Pt is progressing slowly with therapy. Pt is able to ambulate a few steps with RW mod assist. Pt completed basic LE exercises with min assist. Agree with d/c plan of d/c to SNF to continue skilled PT to maximize mobility and independence for return home with spouse after SNF stay.  Follow Up Recommendations  SNF     Equipment Recommendations  None recommended by PT    Recommendations for Other Services       Precautions / Restrictions Precautions Precautions: Fall Restrictions Weight Bearing Restrictions: Yes RLE Weight Bearing: Weight bearing as tolerated    Mobility  Bed Mobility Overal bed mobility: Needs Assistance Bed Mobility: Sidelying to Sit   Sidelying to sit: Min assist       General bed mobility comments: cues for technique and bed rails  Transfers Overall transfer level: Needs assistance Equipment used: Rolling walker (2 wheeled) Transfers: Sit to/from Stand Sit to Stand: Mod assist         General transfer comment: cues for hand placement, tactile cues to facilitate forward weight shift over BOS  Ambulation/Gait Ambulation/Gait assistance: Mod assist Ambulation Distance (Feet): 5 Feet Assistive device: Rolling walker (2 wheeled) Gait Pattern/deviations: Step-to pattern;Decreased stance time - right;Trunk flexed Gait velocity: decreased   General Gait Details: pt completed a BSC transfer and self cleaning while standing. Pt c/o fatigue and completed a few steps forward and back to the bed after toielting.   Stairs             Wheelchair Mobility    Modified Rankin (Stroke Patients Only)       Balance Overall balance assessment: Needs assistance Sitting-balance support: No upper extremity supported Sitting balance-Leahy Scale: Fair     Standing balance support: Bilateral upper extremity supported Standing balance-Leahy Scale: Poor                      Cognition Arousal/Alertness: Awake/alert Behavior During Therapy: WFL for tasks assessed/performed Overall Cognitive Status: Within Functional Limits for tasks assessed                 General Comments: pt stated " I feel like I am going to die".    Exercises Total Joint Exercises Ankle Circles/Pumps: AROM;Strengthening;Both;10 reps;Supine Quad Sets: AROM;Strengthening;Both;10 reps;Supine Long Arc Quad: AROM;Strengthening;10 reps;Both;Seated    General Comments        Pertinent Vitals/Pain Pain Assessment: Faces Faces Pain Scale: Hurts little more Pain Location: Right hip Pain Descriptors / Indicators: Discomfort Pain Intervention(s): Limited activity within patient's tolerance;Monitored during session;Repositioned    Home Living                      Prior Function            PT Goals (current goals can now be found in the care plan section) Progress towards PT goals: Progressing toward goals    Frequency    Min 3X/week      PT Plan Current plan remains appropriate  Co-evaluation             End of Session Equipment Utilized During Treatment: Gait belt Activity Tolerance: Patient limited by fatigue;Patient limited by pain Patient left: in bed;with call bell/phone within reach;with family/visitor present     Time: 4008-6761 PT Time Calculation (min) (ACUTE ONLY): 27 min  Charges:  $Gait Training: 8-22 mins $Therapeutic Exercise: 8-22 mins                    G Codes:      Lelon Mast 07/30/2016, 11:18 AM

## 2016-07-30 NOTE — Discharge Summary (Signed)
Physician Discharge Summary  Melissa Mcdaniel JYN:[id number] DOB: February 20, 1933 DOA: 07/24/2016  PCP: [facility] date: 07/24/2016 Discharge date: 07/30/2016  Recommendations for Outpatient Follow-up:  1. Pt will need to follow up with PCP in 1-2 weeks post discharge 2. Please obtain BMP to evaluate electrolytes and kidney function 3. Please also check CBC to evaluate Hg and Hct levels  Discharge Diagnoses:  Principal Problem:   Closed right hip fracture, initial encounter ([facility]) Active Problems:   Hypertension   A-fib (HCC)   Congestive heart failure (CHF) (HCC)   Fall   Malnutrition of moderate degree  Discharge Condition: Stable  Diet recommendation: Heart healthy diet discussed in details   Brief Narrative:   83 y.o. female with history of hypertension, heart failure atrial fibrillation on Eluquis Fell nn her right side and came into the hospital with a right intratrochanteric fracture.  Assessment/Plan:   Clinical history right hip fracture, initial encounter: - continue to hold Eluquis, orthopedic surgery has been consulted - s/p hip repair, post op day #4 - post op anemia, transfused two U PRBC 1/5 and Hg up appropriately - WBAT  Post op anemia - Hg down to 6.4, transfused two U PRBC --> Hg up to 8.8 post transfusion --> 9.7 this am  Chronic atrial fibrillation: - Continue amiodarone and Coreg  - continue Eliquis   Essential Hypertension - SBP in 140s, continue Coreg, Hydralazine   Congestive heart failure (CHF) (HCC) - Indeterminate whether systolic or diastolic.  - Continue Coreg and ACE inhibitor. - weight trend since admission 107 --> 110 lbs this AM  Mechanical Fall - will need SNF, placement in progress  Anxiety, depression  - Klonopin.   Moderate PCM - appreciate nutritionist recommendations   DVT prophylaxis: Eluquis Family Communication: husband at bedside  Disposition Plan/Barrier to D/C:  SNF Code status: Full   IV Access:    Peripheral IV  Medical Consultants:    Ortho  PT  Anti-Infectives:    None  Procedures/Studies: Dg Chest 1 View  Result Date: 07/24/2016 CLINICAL DATA:  Pain to the right hip status post fall EXAM: CHEST 1 VIEW COMPARISON:  None. FINDINGS: There are low lung volumes bilaterally with mild elevation of the right diaphragm. Mildly coarse interstitial pattern suspect chronic change. No acute consolidation or effusion. Heart size upper normal. Atherosclerosis. No pneumothorax. IMPRESSION: 1. Low lung volumes without acute infiltrate 2. Borderline cardiomegaly 3. Diffuse coarsening of the interstitium, suspect chronic change. Electronically Signed   By: Donavan Foil M.D.   On: 07/24/2016 20:08   Dg C-arm 1-60 Min  Result Date: 07/26/2016 CLINICAL DATA:  Right femur surgery. EXAM: RIGHT FEMUR 2 VIEWS; DG C-ARM 61-120 MIN COMPARISON:  No recent . FINDINGS: It should medullary rod and screw fixation right femur. Hardware intact. Anatomic alignment. Three images obtained. 0 minutes 41 seconds fluoroscopy time utilized. IMPRESSION: Postsurgical change right femur. Hardware intact. Anatomic alignment. Electronically Signed   By: Marcello Moores  Register   On: 07/26/2016 10:57   Dg Hip Port Unilat With Pelvis 1v Right  Result Date: 07/26/2016 CLINICAL DATA:  Right femoral nail, hip fracture EXAM: DG HIP (WITH OR WITHOUT PELVIS) 1V PORT RIGHT COMPARISON:  None. FINDINGS: The changes of internal fixation across the right femoral intertrochanteric fracture. Near anatomic alignment. The lesser trochanter remains displaced. No subluxation or dislocation. No hardware complicating feature. IMPRESSION: Internal fixation across the intertrochanteric fracture. No complicating feature. Electronically Signed   By: Rolm Baptise M.D.  On: 07/26/2016 12:15   Dg Hip Unilat  With Pelvis 2-3 Views Right  Result Date: 07/24/2016 CLINICAL DATA:  83 y/o  F; right hip pain after  fall. EXAM: DG HIP (WITH OR WITHOUT PELVIS) 2-3V RIGHT COMPARISON:  None. FINDINGS: Mildly displaced and comminuted right proximal femur intertrochanteric fracture with medial displacement of the lesser tuberosity. No other pelvic fracture is identified. The left proximal femur is well maintained. The right femoral head is well seated in the acetabulum. IMPRESSION: Mildly displaced and comminuted right proximal femur intertrochanteric fracture and medial displacement of the lesser tuberosity. No joint dislocation. No pelvic fracture is identified. Electronically Signed   By: Kristine Garbe M.D.   On: 07/24/2016 20:07   Dg Femur, Min 2 Views Right  Result Date: 07/26/2016 CLINICAL DATA:  Right femur surgery. EXAM: RIGHT FEMUR 2 VIEWS; DG C-ARM 61-120 MIN COMPARISON:  No recent . FINDINGS: It should medullary rod and screw fixation right femur. Hardware intact. Anatomic alignment. Three images obtained. 0 minutes 41 seconds fluoroscopy time utilized. IMPRESSION: Postsurgical change right femur. Hardware intact. Anatomic alignment. Electronically Signed   By: Marcello Moores  Register   On: 07/26/2016 10:57     Discharge Exam: Vitals:   07/29/16 1939 07/30/16 0533  BP: (!) 147/72 (!) 144/49  Pulse: 84 (!) 115  Resp: 16 16  Temp: 98 F (36.7 C) 97.9 F (36.6 C)   Vitals:   07/29/16 0553 07/29/16 1319 07/29/16 1939 07/30/16 0533  BP: (!) 147/65 (!) 111/56 (!) 147/72 (!) 144/49  Pulse: 73 (!) 123 84 (!) 115  Resp: 18 16 16 16   Temp: 99.2 F (37.3 C) 97.6 F (36.4 C) 98 F (36.7 C) 97.9 F (36.6 C)  TempSrc: Oral  Oral Oral  SpO2: 95% 99% 98% 99%  Weight:   53.6 kg (118 lb 2.7 oz)   Height:        General: Pt is alert, follows commands appropriately, not in acute distress Cardiovascular: Irregular rate and rhythm, S1/S2 +, no murmurs, no rubs, no gallops Respiratory: Clear to auscultation bilaterally, no wheezing, no crackles, no rhonchi Abdominal: Soft, non tender, non distended, bowel  sounds +, no guarding Extremities: no edema, no cyanosis, pulses palpable bilaterally DP and PT   Discharge Instructions  Discharge Instructions    Diet - low sodium heart healthy    Complete by:  As directed    Increase activity slowly    Complete by:  As directed      Allergies as of 07/30/2016      Reactions   Penicillins Swelling   Swelling of mouth and tongue      Medication List    TAKE these medications   amiodarone 200 MG tablet Commonly known as:  PACERONE Take 200 mg by mouth daily.   ARIPiprazole 5 MG tablet Commonly known as:  ABILIFY Take 5 mg by mouth daily.   baclofen 10 MG tablet Commonly known as:  LIORESAL Take 1 tablet (10 mg total) by mouth 3 (three) times daily as needed for muscle spasms.   carvedilol 3.125 MG tablet Commonly known as:  COREG TK 1 T PO BID MEALS   clonazePAM 0.5 MG tablet Commonly known as:  KLONOPIN TK 1 T PO BID What changed:  See the new instructions.   ELIQUIS 2.5 MG Tabs tablet Generic drug:  apixaban Take 2.5 mg by mouth 2 (two) times daily.   furosemide 20 MG tablet Commonly known as:  LASIX TK 1 T PO QD OR UTD  hydrALAZINE 25 MG tablet Commonly known as:  APRESOLINE Take 25 mg by mouth 2 (two) times daily.   HYDROcodone-acetaminophen 5-325 MG tablet Commonly known as:  NORCO Take 1-2 tablets by mouth every 6 (six) hours as needed for moderate pain.   latanoprost 0.005 % ophthalmic solution Commonly known as:  XALATAN Place 1 drop into both eyes at bedtime.   lisinopril 2.5 MG tablet Commonly known as:  PRINIVIL,ZESTRIL TK 1 T PO D   ONE-TABLET-DAILY/IRON PO Take 1 tablet by mouth daily.   VITAMIN B-3 PO Take 1 tablet by mouth daily.      Follow-up Information    MURPHY, TIMOTHY D, MD Follow up.   Specialty:  Orthopedic Surgery Contact information: [address] [place] [address] [phone]        [facility] [place] Follow up.   Specialty:   Family Medicine Contact information: [place] [address] [phone]          The results of significant diagnostics from this hospitalization (including imaging, microbiology, ancillary and laboratory) are listed below for reference.    Microbiology: Recent Results (from the past 240 hour(s))  MRSA PCR Screening     Status: None   Collection Time: 07/25/16  3:42 PM  Result Value Ref Range Status   MRSA by PCR NEGATIVE NEGATIVE Final     Labs: Basic Metabolic Panel:  Recent Labs Lab 07/24/16 1946 07/25/16 0530 07/27/16 0820 07/28/16 0658 07/29/16 0359  NA 136 137 133* 133* 137  K 4.2 4.5 4.1 4.2 3.5  CL 107 106 100* 100* 101  CO2 20* 22 27 25 28   GLUCOSE 127* 119* 138* 121* 160*  BUN 22* 19 11 13 15   CREATININE 1.07* 1.09* 0.99 0.94 1.03*  CALCIUM 8.5* 8.8* 8.1* 8.3* 8.5*   Liver Function Tests:  Recent Labs Lab 07/24/16 1946  AST 18  ALT 10*  ALKPHOS 88  BILITOT 0.5  PROT 7.0  ALBUMIN 3.7   CBC:  Recent Labs Lab 07/24/16 1946 07/25/16 0530 07/27/16 0820 07/28/16 0658 07/29/16 0359 07/30/16 0919  WBC 10.5 6.5 5.2 6.7 6.0 5.3  NEUTROABS 9.6*  --   --   --   --   --   HGB 9.7* 9.7* 6.4* 8.8* 9.2* 9.7*  HCT 30.8* 31.9* 20.6* 27.2* 28.9* 31.0*  MCV 86.5 87.2 86.6 84.0 86.0 87.3  PLT 199 251 159 177 227 267   SIGNED: Time coordinating discharge: 30 minutes  MAGICK-Quaniya Damas, MD  [facility] 07/30/2016, 10:37 AM Pager [phone]  If 7PM-7AM, please contact night-coverage [email or web address] Password [email or web address]

## 2016-07-30 NOTE — Care Management Important Message (Signed)
Important Message  Patient Details  Name: Melissa Mcdaniel MRN: [id number] Date of Birth: April 01, 1933   Medicare Important Message Given:  Yes    [name] 07/30/2016, 3:39 PM

## 2016-07-30 NOTE — Discharge Instructions (Signed)
Hip Fracture A hip fracture is a fracture of the upper part of your thigh bone (femur). What are the causes? A hip fracture is caused by a direct blow to the side of your hip. This is usually the result of a fall but can occur in other circumstances, such as an automobile accident. What increases the risk? There is an increased risk of hip fractures in people with:  An unsteady walking pattern (gait) and those with conditions that contribute to poor balance, such as Parkinson's disease or dementia.  Osteopenia and osteoporosis.  Cancer that spreads to the leg bones.  Certain metabolic diseases. What are the signs or symptoms? Symptoms of hip fracture include:  Pain over the injured hip.  Inability to put weight on the leg in which the fracture occurred (although, some patients are able to walk after a hip fracture).  Toes and foot of the affected leg point outward when you lie down. How is this diagnosed? A physical exam can determine if a hip fracture is likely to have occurred. X-ray exams are needed to confirm the fracture and to look for other injuries. The X-ray exam can help to determine the type of hip fracture. Rarely, the fracture is not visible on an X-ray image and a CT scan or MRI will have to be done. How is this treated? The treatment for a fracture is usually surgery. This means using a screw, nail, or rod to hold the bones in place. Follow these instructions at home: Take all medicines as directed by your health care provider. Contact a health care provider if: Pain continues, even after taking pain medicine. This information is not intended to replace advice given to you by your health care provider. Make sure you discuss any questions you have with your health care provider. Document Released: 07/09/2005 Document Revised: 12/15/2015 Document Reviewed: 02/18/2013 Elsevier Interactive Patient Education  2017 [place] on my medicine - ELIQUIS  (apixaban)  Why was Eliquis prescribed for you? Eliquis was prescribed for you to reduce the risk of a blood clot forming that can cause a stroke if you have a medical condition called atrial fibrillation (a type of irregular heartbeat).  What do You need to know about Eliquis ? Take your Eliquis TWICE DAILY - one tablet in the morning and one tablet in the evening with or without food. If you have difficulty swallowing the tablet whole please discuss with your pharmacist how to take the medication safely.  Take Eliquis exactly as prescribed by your doctor and DO NOT stop taking Eliquis without talking to the doctor who prescribed the medication.  Stopping may increase your risk of developing a stroke.  Refill your prescription before you run out.  After discharge, you should have regular check-up appointments with your healthcare provider that is prescribing your Eliquis.  In the future your dose may need to be changed if your kidney function or weight changes by a significant amount or as you get older.  What do you do if you miss a dose? If you miss a dose, take it as soon as you remember on the same day and resume taking twice daily.  Do not take more than one dose of ELIQUIS at the same time to make up a missed dose.  Important Safety Information A possible side effect of Eliquis is bleeding. You should call your healthcare provider right away if you experience any of the following: ? Bleeding from an injury or your nose that  does not stop. ? Unusual colored urine (red or dark brown) or unusual colored stools (red or black). ? Unusual bruising for unknown reasons. ? A serious fall or if you hit your head (even if there is no bleeding).  Some medicines may interact with Eliquis and might increase your risk of bleeding or clotting while on Eliquis. To help avoid this, consult your healthcare provider or pharmacist prior to using any new prescription or non-prescription medications,  including herbals, vitamins, non-steroidal anti-inflammatory drugs (NSAIDs) and supplements.  This website has more information on Eliquis (apixaban): http://www.eliquis.com/eliquis/home

## 2016-07-30 NOTE — Clinical Social Work Note (Signed)
Clinical Social Worker facilitated patient discharge including contacting patient family and facility to confirm patient discharge plans.  Clinical information faxed to facility and family agreeable with plan.  CSW arranged ambulance transport via [facility] to [facility] .  RN to call [phone] for report prior to discharge. Patient will go to room 7005.  Clinical Social Worker will sign off for now as social work intervention is no longer needed. Please consult [place] again if new need arises.  [address], [place]

## 2016-08-01 DIAGNOSIS — I4891 Unspecified atrial fibrillation: Secondary | ICD-10-CM | POA: Diagnosis not present

## 2016-08-01 DIAGNOSIS — D509 Iron deficiency anemia, unspecified: Secondary | ICD-10-CM | POA: Diagnosis not present

## 2016-08-01 DIAGNOSIS — S7291XD Unspecified fracture of right femur, subsequent encounter for closed fracture with routine healing: Secondary | ICD-10-CM | POA: Diagnosis not present

## 2016-08-01 DIAGNOSIS — I10 Essential (primary) hypertension: Secondary | ICD-10-CM | POA: Diagnosis not present

## 2016-08-01 DIAGNOSIS — I509 Heart failure, unspecified: Secondary | ICD-10-CM | POA: Diagnosis not present

## 2016-08-13 DIAGNOSIS — S72141D Displaced intertrochanteric fracture of right femur, subsequent encounter for closed fracture with routine healing: Secondary | ICD-10-CM | POA: Diagnosis not present

## 2016-08-14 DIAGNOSIS — I509 Heart failure, unspecified: Secondary | ICD-10-CM | POA: Diagnosis not present

## 2016-08-14 DIAGNOSIS — M25551 Pain in right hip: Secondary | ICD-10-CM | POA: Diagnosis not present

## 2016-08-14 DIAGNOSIS — D62 Acute posthemorrhagic anemia: Secondary | ICD-10-CM | POA: Diagnosis not present

## 2016-08-14 DIAGNOSIS — M79609 Pain in unspecified limb: Secondary | ICD-10-CM | POA: Diagnosis not present

## 2016-08-14 DIAGNOSIS — S72001D Fracture of unspecified part of neck of right femur, subsequent encounter for closed fracture with routine healing: Secondary | ICD-10-CM | POA: Diagnosis not present

## 2016-08-14 DIAGNOSIS — R2689 Other abnormalities of gait and mobility: Secondary | ICD-10-CM | POA: Diagnosis not present

## 2016-08-14 DIAGNOSIS — K5000 Crohn's disease of small intestine without complications: Secondary | ICD-10-CM | POA: Diagnosis not present

## 2016-08-15 DIAGNOSIS — R197 Diarrhea, unspecified: Secondary | ICD-10-CM | POA: Diagnosis not present

## 2016-08-15 DIAGNOSIS — K5090 Crohn's disease, unspecified, without complications: Secondary | ICD-10-CM | POA: Diagnosis not present

## 2016-08-15 DIAGNOSIS — R000 Tachycardia, unspecified: Secondary | ICD-10-CM | POA: Diagnosis not present

## 2016-08-15 DIAGNOSIS — E440 Moderate protein-calorie malnutrition: Secondary | ICD-10-CM | POA: Diagnosis not present

## 2016-08-16 ENCOUNTER — Encounter (HOSPITAL_COMMUNITY): Payer: Self-pay | Admitting: Emergency Medicine

## 2016-08-16 ENCOUNTER — Emergency Department (HOSPITAL_COMMUNITY): Payer: Medicare Other

## 2016-08-16 ENCOUNTER — Other Ambulatory Visit: Payer: Self-pay

## 2016-08-16 ENCOUNTER — Emergency Department (HOSPITAL_COMMUNITY)
Admission: EM | Admit: 2016-08-16 | Discharge: 2016-08-17 | Disposition: A | Discharge status: Home / Self Care | Payer: Medicare Other | Attending: Emergency Medicine | Admitting: Emergency Medicine

## 2016-08-16 DIAGNOSIS — I482 Chronic atrial fibrillation, unspecified: Secondary | ICD-10-CM

## 2016-08-16 DIAGNOSIS — I4891 Unspecified atrial fibrillation: Secondary | ICD-10-CM | POA: Diagnosis not present

## 2016-08-16 DIAGNOSIS — I509 Heart failure, unspecified: Secondary | ICD-10-CM | POA: Diagnosis not present

## 2016-08-16 DIAGNOSIS — Z9181 History of falling: Secondary | ICD-10-CM | POA: Diagnosis not present

## 2016-08-16 DIAGNOSIS — I110 Hypertensive heart disease with heart failure: Secondary | ICD-10-CM | POA: Diagnosis not present

## 2016-08-16 DIAGNOSIS — S72141D Displaced intertrochanteric fracture of right femur, subsequent encounter for closed fracture with routine healing: Secondary | ICD-10-CM | POA: Diagnosis not present

## 2016-08-16 DIAGNOSIS — Z7901 Long term (current) use of anticoagulants: Secondary | ICD-10-CM | POA: Insufficient documentation

## 2016-08-16 DIAGNOSIS — R000 Tachycardia, unspecified: Secondary | ICD-10-CM | POA: Diagnosis not present

## 2016-08-16 HISTORY — DX: Fracture of unspecified part of neck of unspecified femur, initial encounter for closed fracture: S72.009A

## 2016-08-16 LAB — COMPREHENSIVE METABOLIC PANEL
ALK PHOS: 119 U/L (ref 38–126)
ALT: 15 U/L (ref 14–54)
ANION GAP: 10 (ref 5–15)
AST: 22 U/L (ref 15–41)
Albumin: 3 g/dL — ABNORMAL LOW (ref 3.5–5.0)
BILIRUBIN TOTAL: 0.4 mg/dL (ref 0.3–1.2)
BUN: 21 mg/dL — ABNORMAL HIGH (ref 6–20)
CO2: 21 mmol/L — AB (ref 22–32)
CREATININE: 1.32 mg/dL — AB (ref 0.44–1.00)
Calcium: 9.1 mg/dL (ref 8.9–10.3)
Chloride: 104 mmol/L (ref 101–111)
GFR calc non Af Amer: 37 mL/min — ABNORMAL LOW (ref >60)
GFR, EST AFRICAN AMERICAN: 43 mL/min — AB (ref >60)
GLUCOSE: 124 mg/dL — AB (ref 65–99)
Potassium: 3.8 mmol/L (ref 3.5–5.1)
Sodium: 135 mmol/L (ref 135–145)
TOTAL PROTEIN: 6.3 g/dL — AB (ref 6.5–8.1)

## 2016-08-16 LAB — CBC WITH DIFFERENTIAL/PLATELET
Basophils Absolute: 0 10*3/uL (ref 0.0–0.1)
Basophils Relative: 0 %
Eosinophils Absolute: 0 10*3/uL (ref 0.0–0.7)
Eosinophils Relative: 0 %
HCT: 31.2 % — ABNORMAL LOW (ref 36.0–46.0)
HEMOGLOBIN: 9.6 g/dL — AB (ref 12.0–15.0)
LYMPHS ABS: 0.7 10*3/uL (ref 0.7–4.0)
LYMPHS PCT: 11 %
MCH: 26.8 pg (ref 26.0–34.0)
MCHC: 30.8 g/dL (ref 30.0–36.0)
MCV: 87.2 fL (ref 78.0–100.0)
MONOS PCT: 9 %
Monocytes Absolute: 0.5 10*3/uL (ref 0.1–1.0)
NEUTROS PCT: 80 %
Neutro Abs: 4.8 10*3/uL (ref 1.7–7.7)
Platelets: 295 10*3/uL (ref 150–400)
RBC: 3.58 MIL/uL — AB (ref 3.87–5.11)
RDW: 15 % (ref 11.5–15.5)
WBC: 6.1 10*3/uL (ref 4.0–10.5)

## 2016-08-16 LAB — URINALYSIS, ROUTINE W REFLEX MICROSCOPIC
BILIRUBIN URINE: NEGATIVE
Glucose, UA: NEGATIVE mg/dL
Hgb urine dipstick: NEGATIVE
KETONES UR: NEGATIVE mg/dL
Nitrite: NEGATIVE
PH: 5 (ref 5.0–8.0)
Protein, ur: NEGATIVE mg/dL
Specific Gravity, Urine: 1.01 (ref 1.005–1.030)

## 2016-08-16 LAB — I-STAT TROPONIN, ED: Troponin i, poc: 0 ng/mL (ref 0.00–0.08)

## 2016-08-16 MED ORDER — SODIUM CHLORIDE 0.9 % IV BOLUS (SEPSIS)
500.0000 mL | Freq: Once | INTRAVENOUS | Status: AC
  Administered 2016-08-16: 500 mL via INTRAVENOUS

## 2016-08-16 MED ORDER — DILTIAZEM HCL 25 MG/5ML IV SOLN
10.0000 mg | Freq: Once | INTRAVENOUS | Status: AC
  Administered 2016-08-16: 10 mg via INTRAVENOUS
  Filled 2016-08-16: qty 5

## 2016-08-16 NOTE — ED Triage Notes (Signed)
Pt discharged home today from rehab after having hip surgery.  Home health found pt to have elevated temp, rapid heart rate and shortness of breath on her visit this pm.  Pt has no complaints except for post op pain to right hip

## 2016-08-16 NOTE — Discharge Instructions (Signed)
Laboratory tests and x-rays are reassuring. No evidence of an acute infection. Follow-up with your primary care doctor.

## 2016-08-16 NOTE — ED Provider Notes (Signed)
[facility] DEPT Provider Note   CSN: [id number] Arrival date & time: 08/16/16  1935     History   Chief Complaint Chief Complaint  Patient presents with  . Fever  . Tachycardia    HPI Melissa Mcdaniel is a 83 y.o. female.  HPI Pt was just released from rehab after having had hip surgery.  She had a home health assessment today.  They checked her temperature and was told she had a fever.  She was told that she needed to go to the hospital.  Pt states she has been feeling fine other than having leg soreness.  It still hurts to walk.  She has not noticed any redness or drainage.  No cough.  No trouble urinating.  She has had a little bit of a runny nose but otherwise no other symptoms.  Past Medical History:  Diagnosis Date  . A-fib ([place])   . Congestive heart failure (CHF) ([place]) 05/2016  . Crohn's disease ([place])   . Dysrhythmia    afib dx approx. 2016 per pt  . Hip fracture ([place])   . Hypertension     Patient Active Problem List   Diagnosis Date Noted  . Malnutrition of moderate degree 07/26/2016  . Closed right hip fracture, initial encounter ([place]) 07/24/2016  . Hypertension 07/24/2016  . A-fib ([place]) 07/24/2016  . Fall 07/24/2016  . Congestive heart failure (CHF) ([place]) 05/23/2016    Past Surgical History:  Procedure Laterality Date  . FEMUR IM NAIL Right 07/26/2016   Procedure: INTRAMEDULLARY (IM) NAIL FEMORAL;  Surgeon: Renette Butters, MD;  Location: [facility];  Service: Orthopedics;  Laterality: Right;  . FRACTURE SURGERY    . large intestine - partial removal  approx 1993   due to crohns    OB History    No data available       Home Medications    Prior to Admission medications   Medication Sig Start Date End Date Taking? Authorizing Provider  amiodarone (PACERONE) 200 MG tablet Take 200 mg by mouth daily.   Yes Historical Provider, MD  apixaban (ELIQUIS) 2.5 MG TABS tablet Take 2.5 mg by mouth 2 (two) times daily.   Yes Historical Provider, MD    ARIPiprazole (ABILIFY) 5 MG tablet Take 5 mg by mouth daily.   Yes Historical Provider, MD  baclofen (LIORESAL) 10 MG tablet Take 1 tablet (10 mg total) by mouth 3 (three) times daily as needed for muscle spasms. 07/26/16  Yes Charna Elizabeth Martensen III, PA-C  carvedilol (COREG) 3.125 MG tablet TAKES 1 TAB BY MOUTH TWICE DAILY 06/02/16  Yes Historical Provider, MD  clonazePAM (KLONOPIN) 0.5 MG tablet TK 1 T PO BID Patient taking differently: Take 0.5 mg by mouth 2 (two) times daily. TK 1 T PO BID 07/30/16  Yes Theodis Blaze, MD  diltiazem (DILACOR XR) 120 MG 24 hr capsule Take 120 mg by mouth daily.   Yes Historical Provider, MD  diphenoxylate-atropine (LOMOTIL) 2.5-0.025 MG tablet Take 1 tablet by mouth 4 (four) times daily as needed for diarrhea or loose stools.   Yes Historical Provider, MD  furosemide (LASIX) 20 MG tablet TAKES 1 TABLET BY MOUTH AS NEEDED FOR EDEMA 05/16/16  Yes Historical Provider, MD  hydrALAZINE (APRESOLINE) 25 MG tablet Take 25 mg by mouth 2 (two) times daily.   Yes Historical Provider, MD  HYDROcodone-acetaminophen (NORCO) 5-325 MG tablet Take 1-2 tablets by mouth every 6 (six) hours as needed for moderate pain. 07/26/16  Yes Charna Elizabeth  Martensen III, PA-C  latanoprost (XALATAN) 0.005 % ophthalmic solution Place 1 drop into both eyes at bedtime.   Yes Historical Provider, MD  lisinopril (PRINIVIL,ZESTRIL) 2.5 MG tablet TAKES 2.5MG BY MOUTH ONCE DAILY 06/02/16  Yes Historical Provider, MD  loperamide (IMODIUM) 2 MG capsule Take 4 mg by mouth 4 (four) times daily as needed for diarrhea or loose stools.   Yes Historical Provider, MD  Melatonin 5 MG TABS Take 5 mg by mouth at bedtime as needed (SLEEP).   Yes Historical Provider, MD  Multiple Vitamins-Iron (ONE-TABLET-DAILY/IRON PO) Take 1 tablet by mouth daily.   Yes Historical Provider, MD  ranitidine (ZANTAC) 150 MG tablet Take 150 mg by mouth daily as needed for heartburn.   Yes Historical Provider, MD  traMADol (ULTRAM) 50 MG  tablet Take 25 mg by mouth every 6 (six) hours as needed.   Yes Historical Provider, MD    Family History No family history on file.  Social History Social History  Substance Use Topics  . Smoking status: Never Smoker  . Smokeless tobacco: Never Used  . Alcohol use No     Allergies   Penicillins   Review of Systems Review of Systems  All other systems reviewed and are negative.    Physical Exam Updated Vital Signs BP 112/57   Pulse 99   Temp 98.5 F (36.9 C) (Oral)   Resp 13   Ht 5' 1"  (1.549 m)   Wt 49.4 kg   SpO2 96%   BMI 20.60 kg/m   Physical Exam  Constitutional: She appears well-developed and well-nourished. No distress.  HENT:  Head: Normocephalic and atraumatic.  Right Ear: External ear normal.  Left Ear: External ear normal.  Eyes: Conjunctivae are normal. Right eye exhibits no discharge. Left eye exhibits no discharge. No scleral icterus.  Neck: Neck supple. No tracheal deviation present.  Cardiovascular: Intact distal pulses.  An irregularly irregular rhythm present. Tachycardia present.   Pulmonary/Chest: Effort normal and breath sounds normal. No stridor. No respiratory distress. She has no wheezes. She has no rales.  Abdominal: Soft. Bowel sounds are normal. She exhibits no distension. There is no tenderness. There is no rebound and no guarding.  Musculoskeletal: She exhibits no edema or tenderness.  Right hip, no erythema or drainage  Neurological: She is alert. She has normal strength. No cranial nerve deficit (no facial droop, extraocular movements intact, no slurred speech) or sensory deficit. She exhibits normal muscle tone. She displays no seizure activity. Coordination normal.  Skin: Skin is warm and dry. No rash noted.  Psychiatric: She has a normal mood and affect.  Nursing note and vitals reviewed.    ED Treatments / Results  Labs (all labs ordered are listed, but only abnormal results are displayed) Labs Reviewed  CBC WITH  DIFFERENTIAL/PLATELET - Abnormal; Notable for the following:       Result Value   RBC 3.58 (*)    Hemoglobin 9.6 (*)    HCT 31.2 (*)    All other components within normal limits  COMPREHENSIVE METABOLIC PANEL - Abnormal; Notable for the following:    CO2 21 (*)    Glucose, Bld 124 (*)    BUN 21 (*)    Creatinine, Ser 1.32 (*)    Total Protein 6.3 (*)    Albumin 3.0 (*)    GFR calc non Af Amer 37 (*)    GFR calc Af Amer 43 (*)    All other components within normal limits  URINALYSIS, ROUTINE  W REFLEX MICROSCOPIC - Abnormal; Notable for the following:    APPearance HAZY (*)    Leukocytes, UA TRACE (*)    Bacteria, UA RARE (*)    Squamous Epithelial / LPF 0-5 (*)    All other components within normal limits  I-STAT TROPOININ, ED    EKG  EKG Interpretation None       Radiology Dg Chest 2 View  Result Date: 08/16/2016 CLINICAL DATA:  Fever and tachycardia.  Recent hip surgery EXAM: CHEST  2 VIEW COMPARISON:  07/24/2016 FINDINGS: 1822 hours. Lungs are hyperexpanded. The lungs are clear wiithout focal pneumonia, edema, pneumothorax or pleural effusion. Interstitial markings are diffusely coarsened with chronic features. The cardio pericardial silhouette is enlarged. Bones are diffusely demineralized. IMPRESSION: No active cardiopulmonary disease. Electronically Signed   By: Misty Stanley M.D.   On: 08/16/2016 22:48    Procedures Procedures (including critical care time)  Medications Ordered in ED Medications  diltiazem (CARDIZEM) injection 10 mg (10 mg Intravenous Given 08/16/16 2257)  sodium chloride 0.9 % bolus 500 mL (500 mLs Intravenous New Bag/Given 08/16/16 2301)     Initial Impression / Assessment and Plan / ED Course  I have reviewed the triage vital signs and the nursing notes.  Pertinent labs & imaging results that were available during my care of the patient were reviewed by me and considered in my medical decision making (see chart for details).   patient is  afebrile in the emergency room. Laboratory tests are reassuring. She has a stable anemia. Urinalysis does not suggest urinary tract infection. Chest x-ray does not show a pneumonia.  I doubt an acute bacterial infection associated with the fever noted by the home health nurse.  Patient was tachycardic but didn't think this was related to her atrial fibrillation. This is a known condition for her. She was given a dose of Cardizem while she was in the emergency room. She is otherwise feeling well. For discharge.  CHA2DS2/VAS Stroke Risk Points      5 >= 2 Points: High Risk  1 - 1.99 Points: Medium Risk  0 Points: Low Risk    The patient's score has not changed in the past year.:  No Change       Final Clinical Impressions(s) / ED Diagnoses   Final diagnoses:  Chronic atrial fibrillation [facility])    New Prescriptions New Prescriptions   No medications on file     Dorie Rank, MD 08/16/16 2336

## 2016-08-17 DIAGNOSIS — Z7901 Long term (current) use of anticoagulants: Secondary | ICD-10-CM | POA: Diagnosis not present

## 2016-08-17 DIAGNOSIS — I509 Heart failure, unspecified: Secondary | ICD-10-CM | POA: Diagnosis not present

## 2016-08-17 DIAGNOSIS — I110 Hypertensive heart disease with heart failure: Secondary | ICD-10-CM | POA: Diagnosis not present

## 2016-08-17 DIAGNOSIS — I4891 Unspecified atrial fibrillation: Secondary | ICD-10-CM | POA: Diagnosis not present

## 2016-08-17 DIAGNOSIS — S72141D Displaced intertrochanteric fracture of right femur, subsequent encounter for closed fracture with routine healing: Secondary | ICD-10-CM | POA: Diagnosis not present

## 2016-08-17 DIAGNOSIS — Z9181 History of falling: Secondary | ICD-10-CM | POA: Diagnosis not present

## 2016-08-20 DIAGNOSIS — I110 Hypertensive heart disease with heart failure: Secondary | ICD-10-CM | POA: Diagnosis not present

## 2016-08-20 DIAGNOSIS — I4891 Unspecified atrial fibrillation: Secondary | ICD-10-CM | POA: Diagnosis not present

## 2016-08-20 DIAGNOSIS — I509 Heart failure, unspecified: Secondary | ICD-10-CM | POA: Diagnosis not present

## 2016-08-20 DIAGNOSIS — Z7901 Long term (current) use of anticoagulants: Secondary | ICD-10-CM | POA: Diagnosis not present

## 2016-08-20 DIAGNOSIS — S72141D Displaced intertrochanteric fracture of right femur, subsequent encounter for closed fracture with routine healing: Secondary | ICD-10-CM | POA: Diagnosis not present

## 2016-08-20 DIAGNOSIS — Z9181 History of falling: Secondary | ICD-10-CM | POA: Diagnosis not present

## 2016-08-23 DIAGNOSIS — Z9181 History of falling: Secondary | ICD-10-CM | POA: Diagnosis not present

## 2016-08-23 DIAGNOSIS — I110 Hypertensive heart disease with heart failure: Secondary | ICD-10-CM | POA: Diagnosis not present

## 2016-08-23 DIAGNOSIS — I509 Heart failure, unspecified: Secondary | ICD-10-CM | POA: Diagnosis not present

## 2016-08-23 DIAGNOSIS — S72141D Displaced intertrochanteric fracture of right femur, subsequent encounter for closed fracture with routine healing: Secondary | ICD-10-CM | POA: Diagnosis not present

## 2016-08-23 DIAGNOSIS — I4891 Unspecified atrial fibrillation: Secondary | ICD-10-CM | POA: Diagnosis not present

## 2016-08-23 DIAGNOSIS — Z7901 Long term (current) use of anticoagulants: Secondary | ICD-10-CM | POA: Diagnosis not present

## 2016-08-24 DIAGNOSIS — I509 Heart failure, unspecified: Secondary | ICD-10-CM | POA: Diagnosis not present

## 2016-08-24 DIAGNOSIS — I110 Hypertensive heart disease with heart failure: Secondary | ICD-10-CM | POA: Diagnosis not present

## 2016-08-24 DIAGNOSIS — Z9181 History of falling: Secondary | ICD-10-CM | POA: Diagnosis not present

## 2016-08-24 DIAGNOSIS — S72141D Displaced intertrochanteric fracture of right femur, subsequent encounter for closed fracture with routine healing: Secondary | ICD-10-CM | POA: Diagnosis not present

## 2016-08-24 DIAGNOSIS — I4891 Unspecified atrial fibrillation: Secondary | ICD-10-CM | POA: Diagnosis not present

## 2016-08-24 DIAGNOSIS — Z7901 Long term (current) use of anticoagulants: Secondary | ICD-10-CM | POA: Diagnosis not present

## 2016-08-27 DIAGNOSIS — I110 Hypertensive heart disease with heart failure: Secondary | ICD-10-CM | POA: Diagnosis not present

## 2016-08-27 DIAGNOSIS — S72141D Displaced intertrochanteric fracture of right femur, subsequent encounter for closed fracture with routine healing: Secondary | ICD-10-CM | POA: Diagnosis not present

## 2016-08-27 DIAGNOSIS — I4891 Unspecified atrial fibrillation: Secondary | ICD-10-CM | POA: Diagnosis not present

## 2016-08-27 DIAGNOSIS — Z7901 Long term (current) use of anticoagulants: Secondary | ICD-10-CM | POA: Diagnosis not present

## 2016-08-27 DIAGNOSIS — Z9181 History of falling: Secondary | ICD-10-CM | POA: Diagnosis not present

## 2016-08-27 DIAGNOSIS — I509 Heart failure, unspecified: Secondary | ICD-10-CM | POA: Diagnosis not present

## 2016-08-28 DIAGNOSIS — I110 Hypertensive heart disease with heart failure: Secondary | ICD-10-CM | POA: Diagnosis not present

## 2016-08-28 DIAGNOSIS — I509 Heart failure, unspecified: Secondary | ICD-10-CM | POA: Diagnosis not present

## 2016-08-28 DIAGNOSIS — S72141D Displaced intertrochanteric fracture of right femur, subsequent encounter for closed fracture with routine healing: Secondary | ICD-10-CM | POA: Diagnosis not present

## 2016-08-28 DIAGNOSIS — K50914 Crohn's disease, unspecified, with abscess: Secondary | ICD-10-CM | POA: Diagnosis not present

## 2016-08-28 DIAGNOSIS — Z7901 Long term (current) use of anticoagulants: Secondary | ICD-10-CM | POA: Diagnosis not present

## 2016-08-28 DIAGNOSIS — I4891 Unspecified atrial fibrillation: Secondary | ICD-10-CM | POA: Diagnosis not present

## 2016-08-28 DIAGNOSIS — Z9181 History of falling: Secondary | ICD-10-CM | POA: Diagnosis not present

## 2016-08-29 DIAGNOSIS — I110 Hypertensive heart disease with heart failure: Secondary | ICD-10-CM | POA: Diagnosis not present

## 2016-08-29 DIAGNOSIS — S72141D Displaced intertrochanteric fracture of right femur, subsequent encounter for closed fracture with routine healing: Secondary | ICD-10-CM | POA: Diagnosis not present

## 2016-08-29 DIAGNOSIS — Z7901 Long term (current) use of anticoagulants: Secondary | ICD-10-CM | POA: Diagnosis not present

## 2016-08-29 DIAGNOSIS — I4891 Unspecified atrial fibrillation: Secondary | ICD-10-CM | POA: Diagnosis not present

## 2016-08-29 DIAGNOSIS — Z9181 History of falling: Secondary | ICD-10-CM | POA: Diagnosis not present

## 2016-08-29 DIAGNOSIS — I509 Heart failure, unspecified: Secondary | ICD-10-CM | POA: Diagnosis not present

## 2016-08-31 DIAGNOSIS — I110 Hypertensive heart disease with heart failure: Secondary | ICD-10-CM | POA: Diagnosis not present

## 2016-08-31 DIAGNOSIS — I4891 Unspecified atrial fibrillation: Secondary | ICD-10-CM | POA: Diagnosis not present

## 2016-08-31 DIAGNOSIS — I509 Heart failure, unspecified: Secondary | ICD-10-CM | POA: Diagnosis not present

## 2016-08-31 DIAGNOSIS — Z9181 History of falling: Secondary | ICD-10-CM | POA: Diagnosis not present

## 2016-08-31 DIAGNOSIS — S72141D Displaced intertrochanteric fracture of right femur, subsequent encounter for closed fracture with routine healing: Secondary | ICD-10-CM | POA: Diagnosis not present

## 2016-08-31 DIAGNOSIS — Z7901 Long term (current) use of anticoagulants: Secondary | ICD-10-CM | POA: Diagnosis not present

## 2016-09-03 DIAGNOSIS — I110 Hypertensive heart disease with heart failure: Secondary | ICD-10-CM | POA: Diagnosis not present

## 2016-09-03 DIAGNOSIS — Z9181 History of falling: Secondary | ICD-10-CM | POA: Diagnosis not present

## 2016-09-03 DIAGNOSIS — F339 Major depressive disorder, recurrent, unspecified: Secondary | ICD-10-CM | POA: Diagnosis not present

## 2016-09-03 DIAGNOSIS — N951 Menopausal and female climacteric states: Secondary | ICD-10-CM | POA: Diagnosis not present

## 2016-09-03 DIAGNOSIS — S72141D Displaced intertrochanteric fracture of right femur, subsequent encounter for closed fracture with routine healing: Secondary | ICD-10-CM | POA: Diagnosis not present

## 2016-09-03 DIAGNOSIS — I509 Heart failure, unspecified: Secondary | ICD-10-CM | POA: Diagnosis not present

## 2016-09-03 DIAGNOSIS — Z7901 Long term (current) use of anticoagulants: Secondary | ICD-10-CM | POA: Diagnosis not present

## 2016-09-03 DIAGNOSIS — I10 Essential (primary) hypertension: Secondary | ICD-10-CM | POA: Diagnosis not present

## 2016-09-03 DIAGNOSIS — Z8781 Personal history of (healed) traumatic fracture: Secondary | ICD-10-CM | POA: Diagnosis not present

## 2016-09-03 DIAGNOSIS — I4891 Unspecified atrial fibrillation: Secondary | ICD-10-CM | POA: Diagnosis not present

## 2016-09-03 DIAGNOSIS — I480 Paroxysmal atrial fibrillation: Secondary | ICD-10-CM | POA: Diagnosis not present

## 2016-09-03 DIAGNOSIS — F419 Anxiety disorder, unspecified: Secondary | ICD-10-CM | POA: Diagnosis not present

## 2016-09-05 DIAGNOSIS — I509 Heart failure, unspecified: Secondary | ICD-10-CM | POA: Diagnosis not present

## 2016-09-05 DIAGNOSIS — S72141D Displaced intertrochanteric fracture of right femur, subsequent encounter for closed fracture with routine healing: Secondary | ICD-10-CM | POA: Diagnosis not present

## 2016-09-05 DIAGNOSIS — I110 Hypertensive heart disease with heart failure: Secondary | ICD-10-CM | POA: Diagnosis not present

## 2016-09-05 DIAGNOSIS — Z9181 History of falling: Secondary | ICD-10-CM | POA: Diagnosis not present

## 2016-09-05 DIAGNOSIS — Z7901 Long term (current) use of anticoagulants: Secondary | ICD-10-CM | POA: Diagnosis not present

## 2016-09-05 DIAGNOSIS — I4891 Unspecified atrial fibrillation: Secondary | ICD-10-CM | POA: Diagnosis not present

## 2016-09-07 DIAGNOSIS — Z9181 History of falling: Secondary | ICD-10-CM | POA: Diagnosis not present

## 2016-09-07 DIAGNOSIS — I4891 Unspecified atrial fibrillation: Secondary | ICD-10-CM | POA: Diagnosis not present

## 2016-09-07 DIAGNOSIS — S72141D Displaced intertrochanteric fracture of right femur, subsequent encounter for closed fracture with routine healing: Secondary | ICD-10-CM | POA: Diagnosis not present

## 2016-09-07 DIAGNOSIS — Z7901 Long term (current) use of anticoagulants: Secondary | ICD-10-CM | POA: Diagnosis not present

## 2016-09-07 DIAGNOSIS — I110 Hypertensive heart disease with heart failure: Secondary | ICD-10-CM | POA: Diagnosis not present

## 2016-09-07 DIAGNOSIS — I509 Heart failure, unspecified: Secondary | ICD-10-CM | POA: Diagnosis not present

## 2016-09-10 DIAGNOSIS — S72141D Displaced intertrochanteric fracture of right femur, subsequent encounter for closed fracture with routine healing: Secondary | ICD-10-CM | POA: Diagnosis not present

## 2016-09-11 DIAGNOSIS — Z7901 Long term (current) use of anticoagulants: Secondary | ICD-10-CM | POA: Diagnosis not present

## 2016-09-11 DIAGNOSIS — I509 Heart failure, unspecified: Secondary | ICD-10-CM | POA: Diagnosis not present

## 2016-09-11 DIAGNOSIS — I4891 Unspecified atrial fibrillation: Secondary | ICD-10-CM | POA: Diagnosis not present

## 2016-09-11 DIAGNOSIS — I110 Hypertensive heart disease with heart failure: Secondary | ICD-10-CM | POA: Diagnosis not present

## 2016-09-11 DIAGNOSIS — Z9181 History of falling: Secondary | ICD-10-CM | POA: Diagnosis not present

## 2016-09-11 DIAGNOSIS — S72141D Displaced intertrochanteric fracture of right femur, subsequent encounter for closed fracture with routine healing: Secondary | ICD-10-CM | POA: Diagnosis not present

## 2016-09-12 DIAGNOSIS — I4891 Unspecified atrial fibrillation: Secondary | ICD-10-CM | POA: Diagnosis not present

## 2016-09-12 DIAGNOSIS — S72141D Displaced intertrochanteric fracture of right femur, subsequent encounter for closed fracture with routine healing: Secondary | ICD-10-CM | POA: Diagnosis not present

## 2016-09-12 DIAGNOSIS — I509 Heart failure, unspecified: Secondary | ICD-10-CM | POA: Diagnosis not present

## 2016-09-12 DIAGNOSIS — I110 Hypertensive heart disease with heart failure: Secondary | ICD-10-CM | POA: Diagnosis not present

## 2016-09-12 DIAGNOSIS — Z9181 History of falling: Secondary | ICD-10-CM | POA: Diagnosis not present

## 2016-09-12 DIAGNOSIS — Z7901 Long term (current) use of anticoagulants: Secondary | ICD-10-CM | POA: Diagnosis not present

## 2016-09-14 ENCOUNTER — Ambulatory Visit (INDEPENDENT_AMBULATORY_CARE_PROVIDER_SITE_OTHER): Payer: Medicare Other | Admitting: Cardiovascular Disease

## 2016-09-14 ENCOUNTER — Encounter: Payer: Self-pay | Admitting: Cardiovascular Disease

## 2016-09-14 VITALS — BP 140/80 | HR 95 | Ht 60.0 in | Wt 103.2 lb

## 2016-09-14 DIAGNOSIS — I5032 Chronic diastolic (congestive) heart failure: Secondary | ICD-10-CM | POA: Diagnosis not present

## 2016-09-14 DIAGNOSIS — I4891 Unspecified atrial fibrillation: Secondary | ICD-10-CM

## 2016-09-14 DIAGNOSIS — I2510 Atherosclerotic heart disease of native coronary artery without angina pectoris: Secondary | ICD-10-CM | POA: Diagnosis not present

## 2016-09-14 DIAGNOSIS — I10 Essential (primary) hypertension: Secondary | ICD-10-CM | POA: Diagnosis not present

## 2016-09-14 MED ORDER — DILTIAZEM HCL ER 120 MG PO CP24: 120.0000 mg | ORAL_CAPSULE | Freq: Every day | ORAL | 3 refills | Status: DC

## 2016-09-14 NOTE — Progress Notes (Signed)
Chief Complaint  Patient presents with  . New Patient (Initial Visit)    Afib     History of Present Illness: 83 yo female with history of HTN, CAD, chronic systolic CHF, atrial fibrillation, Crohn's disease, colon cancer who is here today as a new patient to establish cardiac care. She moved from [facility] [place] recently to [place] to be near her daughter. I have also seen her husband as a new patient recently. She fell and broke her right hip in January 2018 and had repair of the fracture. Her cardiac history includes atrial fibrillation. She has been on Eliquis. Records indicate that she had DCCV in December 2015 and was started on amiodarone at that time. Her most recent echo in November 2017 at [facility] showed LVEF=35-40% with global HK, moderate MR, mild to moderate TR. She had a cardiac cath in 2002 showing 30% ostial RCA stenosis. She has had colon cancer and has had a colon resection. No evidence of carotid disease by dopplers 2010. Last stress test 2012 did not show ischemia.    She tells me today that she feels well. She has no dyspnea. She has no chest pain. No dizziness or near syncope.   Primary Care Physician: [place] [facility]   Past Medical History:  Diagnosis Date  . A-fib ([place])   . Congestive heart failure (CHF) ([place]) 05/2016  . Crohn's disease ([place])   . Dysrhythmia    afib dx approx. 2016 per pt  . Hip fracture ([place])   . Hypertension     Past Surgical History:  Procedure Laterality Date  . FEMUR IM NAIL Right 07/26/2016   Procedure: INTRAMEDULLARY (IM) NAIL FEMORAL;  Surgeon: Renette Butters, MD;  Location: [facility];  Service: Orthopedics;  Laterality: Right;  . FRACTURE SURGERY    . large intestine - partial removal  approx 1993   due to crohns    Current Outpatient Prescriptions  Medication Sig Dispense Refill  . amiodarone (PACERONE) 200 MG tablet Take 200 mg by mouth daily.    [place] Kitchen apixaban (ELIQUIS) 2.5 MG TABS tablet Take 2.5 mg  by mouth 2 (two) times daily.    . ARIPiprazole (ABILIFY) 5 MG tablet Take 5 mg by mouth daily.    . baclofen (LIORESAL) 10 MG tablet Take 1 tablet (10 mg total) by mouth 3 (three) times daily as needed for muscle spasms. 40 each 0  . carvedilol (COREG) 3.125 MG tablet TAKES 1 TAB BY MOUTH TWICE DAILY  0  . cholestyramine (QUESTRAN) 4 g packet Take 1 packet by mouth 2 (two) times daily. Use as directed    . clonazePAM (KLONOPIN) 0.5 MG tablet TK 1 T PO BID (Patient taking differently: Take 0.5 mg by mouth 2 (two) times daily. TK 1 T PO BID) 30 tablet 0  . diltiazem (DILACOR XR) 120 MG 24 hr capsule Take 1 capsule (120 mg total) by mouth daily. 90 capsule 3  . furosemide (LASIX) 20 MG tablet TAKES 1 TABLET BY MOUTH AS NEEDED FOR EDEMA  0  . hydrALAZINE (APRESOLINE) 25 MG tablet Take 25 mg by mouth 2 (two) times daily.    [place] Kitchen HYDROcodone-acetaminophen (NORCO) 5-325 MG tablet Take 1-2 tablets by mouth every 6 (six) hours as needed for moderate pain. 40 tablet 0  . latanoprost (XALATAN) 0.005 % ophthalmic solution Place 1 drop into both eyes at bedtime.    [place] Kitchen lisinopril (PRINIVIL,ZESTRIL) 2.5 MG tablet TAKES 2.5MG BY MOUTH ONCE DAILY  0  . loperamide (IMODIUM) 2 MG capsule Take 4 mg by mouth 4 (four) times daily as needed for diarrhea or loose stools.    . Multiple Vitamins-Iron (ONE-TABLET-DAILY/IRON PO) Take 1 tablet by mouth daily.    . ranitidine (ZANTAC) 150 MG tablet Take 150 mg by mouth daily as needed for heartburn.     No current facility-administered medications for this visit.     Allergies  Allergen Reactions  . Penicillins Swelling    Swelling of mouth and tongue    Social History   Social History  . Marital status: Married    Spouse name: N/A  . Number of children: 3  . Years of education: N/A   Occupational History  . Worked at [employer]    Social History Main Topics  . Smoking status: Former Smoker    Packs/day: 0.50    Years: 30.00    Types: Cigarettes    Quit  date: 09/15/1975  . Smokeless tobacco: Never Used  . Alcohol use No  . Drug use: No  . Sexual activity: Not on file   Other Topics Concern  . Not on file   Social History Narrative  . No narrative on file    Family History  Problem Relation Age of Onset  . Lung cancer Mother   . Alzheimer's disease Father   . Diabetes Maternal Grandmother     Review of Systems:  As stated in the HPI and otherwise negative.   BP 140/80   Pulse 95   Ht 5' (1.524 m)   Wt 103 lb 3.2 oz (46.8 kg)   BMI 20.15 kg/m   Physical Examination: General: Well developed, well nourished, NAD  HEENT: OP clear, mucus membranes moist  SKIN: warm, dry. No rashes. Neuro: No focal deficits  Musculoskeletal: Muscle strength 5/5 all ext  Psychiatric: Mood and affect normal  Neck: No JVD, no carotid bruits, no thyromegaly, no lymphadenopathy.  Lungs:Clear bilaterally, no wheezes, rhonci, crackles Cardiovascular: Regular rate and rhythm. No murmurs, gallops or rubs. Abdomen:Soft. Bowel sounds present. Non-tender.  Extremities: No lower extremity edema. Pulses are 2 + in the bilateral DP/PT.  EKG:  EKG is ordered today. The ekg ordered today demonstrates Atrial fibrillation, rate 95 LVH  Recent Labs: 08/16/2016: ALT 15; BUN 21; Creatinine, Ser 1.32; Hemoglobin 9.6; Platelets 295; Potassium 3.8; Sodium 135   Lipid Panel No results found for: CHOL, TRIG, HDL, CHOLHDL, VLDL, LDLCALC, LDLDIRECT   Wt Readings from Last 3 Encounters:  09/14/16 103 lb 3.2 oz (46.8 kg)  08/16/16 109 lb (49.4 kg)  07/29/16 118 lb 2.7 oz (53.6 kg)     Other studies Reviewed: Additional studies/ records that were reviewed today include: all old records. Review of the above records demonstrates:   Assessment and Plan:   1. Atrial fibrillation,paroxysmal: She has no awareness of her atrial fibrillation. Will continue amiodarone, Coreg and Cardizem. Will continue Eliquis. She is up to date on eye exams, chest x-ray and thyroid  testing.   2. Chronic diastolic CHF: Her weight is stable. Continue to take Lasix as needed.   3. HTN: BP has been well controlled. No changes.   4. CAD: Mild disease by cath in 2002. No chest pain to suggest angina. Continue current meds.   Current medicines are reviewed at length with the patient today.  The patient does not have concerns regarding medicines.  The following changes have been made:  no change  Labs/ tests ordered today include:   Orders Placed  This Encounter  Procedures  . EKG 12-Lead     Disposition:   FU with me in 6 months   Signed, Lauree Chandler, MD 09/14/2016 1:33 PM    [facility] [address], [place], [place]  [address] Phone: [phone]; Fax: [phone]

## 2016-09-14 NOTE — Patient Instructions (Signed)
Medication Instructions:  Your physician recommends that you continue on your current medications as directed. Please refer to the Current Medication list given to you today.   Labwork: none  Testing/Procedures: none  Follow-Up: Your physician recommends that you schedule a follow-up appointment in: 5 months. Please call our office in about 3 months to schedule this appointment.     Any Other Special Instructions Will Be Listed Below (If Applicable).     If you need a refill on your cardiac medications before your next appointment, please call your pharmacy.

## 2016-09-17 DIAGNOSIS — Z7901 Long term (current) use of anticoagulants: Secondary | ICD-10-CM | POA: Diagnosis not present

## 2016-09-17 DIAGNOSIS — S72141D Displaced intertrochanteric fracture of right femur, subsequent encounter for closed fracture with routine healing: Secondary | ICD-10-CM | POA: Diagnosis not present

## 2016-09-17 DIAGNOSIS — Z9181 History of falling: Secondary | ICD-10-CM | POA: Diagnosis not present

## 2016-09-17 DIAGNOSIS — I4891 Unspecified atrial fibrillation: Secondary | ICD-10-CM | POA: Diagnosis not present

## 2016-09-17 DIAGNOSIS — I509 Heart failure, unspecified: Secondary | ICD-10-CM | POA: Diagnosis not present

## 2016-09-17 DIAGNOSIS — I110 Hypertensive heart disease with heart failure: Secondary | ICD-10-CM | POA: Diagnosis not present

## 2016-09-18 DIAGNOSIS — I4891 Unspecified atrial fibrillation: Secondary | ICD-10-CM | POA: Diagnosis not present

## 2016-09-18 DIAGNOSIS — I110 Hypertensive heart disease with heart failure: Secondary | ICD-10-CM | POA: Diagnosis not present

## 2016-09-18 DIAGNOSIS — Z9181 History of falling: Secondary | ICD-10-CM | POA: Diagnosis not present

## 2016-09-18 DIAGNOSIS — Z7901 Long term (current) use of anticoagulants: Secondary | ICD-10-CM | POA: Diagnosis not present

## 2016-09-18 DIAGNOSIS — S72141D Displaced intertrochanteric fracture of right femur, subsequent encounter for closed fracture with routine healing: Secondary | ICD-10-CM | POA: Diagnosis not present

## 2016-09-18 DIAGNOSIS — I509 Heart failure, unspecified: Secondary | ICD-10-CM | POA: Diagnosis not present

## 2016-09-19 DIAGNOSIS — I4891 Unspecified atrial fibrillation: Secondary | ICD-10-CM | POA: Diagnosis not present

## 2016-09-19 DIAGNOSIS — S72141D Displaced intertrochanteric fracture of right femur, subsequent encounter for closed fracture with routine healing: Secondary | ICD-10-CM | POA: Diagnosis not present

## 2016-09-19 DIAGNOSIS — Z9181 History of falling: Secondary | ICD-10-CM | POA: Diagnosis not present

## 2016-09-19 DIAGNOSIS — Z7901 Long term (current) use of anticoagulants: Secondary | ICD-10-CM | POA: Diagnosis not present

## 2016-09-19 DIAGNOSIS — I110 Hypertensive heart disease with heart failure: Secondary | ICD-10-CM | POA: Diagnosis not present

## 2016-09-19 DIAGNOSIS — I509 Heart failure, unspecified: Secondary | ICD-10-CM | POA: Diagnosis not present

## 2016-09-21 DIAGNOSIS — Z9181 History of falling: Secondary | ICD-10-CM | POA: Diagnosis not present

## 2016-09-21 DIAGNOSIS — I509 Heart failure, unspecified: Secondary | ICD-10-CM | POA: Diagnosis not present

## 2016-09-21 DIAGNOSIS — I4891 Unspecified atrial fibrillation: Secondary | ICD-10-CM | POA: Diagnosis not present

## 2016-09-21 DIAGNOSIS — S72141D Displaced intertrochanteric fracture of right femur, subsequent encounter for closed fracture with routine healing: Secondary | ICD-10-CM | POA: Diagnosis not present

## 2016-09-21 DIAGNOSIS — Z7901 Long term (current) use of anticoagulants: Secondary | ICD-10-CM | POA: Diagnosis not present

## 2016-09-21 DIAGNOSIS — I110 Hypertensive heart disease with heart failure: Secondary | ICD-10-CM | POA: Diagnosis not present

## 2016-09-24 DIAGNOSIS — Z7901 Long term (current) use of anticoagulants: Secondary | ICD-10-CM | POA: Diagnosis not present

## 2016-09-24 DIAGNOSIS — I110 Hypertensive heart disease with heart failure: Secondary | ICD-10-CM | POA: Diagnosis not present

## 2016-09-24 DIAGNOSIS — Z9181 History of falling: Secondary | ICD-10-CM | POA: Diagnosis not present

## 2016-09-24 DIAGNOSIS — I4891 Unspecified atrial fibrillation: Secondary | ICD-10-CM | POA: Diagnosis not present

## 2016-09-24 DIAGNOSIS — S72141D Displaced intertrochanteric fracture of right femur, subsequent encounter for closed fracture with routine healing: Secondary | ICD-10-CM | POA: Diagnosis not present

## 2016-09-24 DIAGNOSIS — I509 Heart failure, unspecified: Secondary | ICD-10-CM | POA: Diagnosis not present

## 2016-09-25 DIAGNOSIS — I110 Hypertensive heart disease with heart failure: Secondary | ICD-10-CM | POA: Diagnosis not present

## 2016-09-25 DIAGNOSIS — I509 Heart failure, unspecified: Secondary | ICD-10-CM | POA: Diagnosis not present

## 2016-09-25 DIAGNOSIS — Z9181 History of falling: Secondary | ICD-10-CM | POA: Diagnosis not present

## 2016-09-25 DIAGNOSIS — Z7901 Long term (current) use of anticoagulants: Secondary | ICD-10-CM | POA: Diagnosis not present

## 2016-09-25 DIAGNOSIS — S72141D Displaced intertrochanteric fracture of right femur, subsequent encounter for closed fracture with routine healing: Secondary | ICD-10-CM | POA: Diagnosis not present

## 2016-09-25 DIAGNOSIS — I4891 Unspecified atrial fibrillation: Secondary | ICD-10-CM | POA: Diagnosis not present

## 2016-09-26 DIAGNOSIS — S72141D Displaced intertrochanteric fracture of right femur, subsequent encounter for closed fracture with routine healing: Secondary | ICD-10-CM | POA: Diagnosis not present

## 2016-09-26 DIAGNOSIS — Z9181 History of falling: Secondary | ICD-10-CM | POA: Diagnosis not present

## 2016-09-26 DIAGNOSIS — Z7901 Long term (current) use of anticoagulants: Secondary | ICD-10-CM | POA: Diagnosis not present

## 2016-09-26 DIAGNOSIS — I110 Hypertensive heart disease with heart failure: Secondary | ICD-10-CM | POA: Diagnosis not present

## 2016-09-26 DIAGNOSIS — I4891 Unspecified atrial fibrillation: Secondary | ICD-10-CM | POA: Diagnosis not present

## 2016-09-26 DIAGNOSIS — I509 Heart failure, unspecified: Secondary | ICD-10-CM | POA: Diagnosis not present

## 2016-09-27 DIAGNOSIS — I509 Heart failure, unspecified: Secondary | ICD-10-CM | POA: Diagnosis not present

## 2016-09-27 DIAGNOSIS — I110 Hypertensive heart disease with heart failure: Secondary | ICD-10-CM | POA: Diagnosis not present

## 2016-09-27 DIAGNOSIS — S72141D Displaced intertrochanteric fracture of right femur, subsequent encounter for closed fracture with routine healing: Secondary | ICD-10-CM | POA: Diagnosis not present

## 2016-09-27 DIAGNOSIS — Z9181 History of falling: Secondary | ICD-10-CM | POA: Diagnosis not present

## 2016-09-27 DIAGNOSIS — I4891 Unspecified atrial fibrillation: Secondary | ICD-10-CM | POA: Diagnosis not present

## 2016-09-27 DIAGNOSIS — Z7901 Long term (current) use of anticoagulants: Secondary | ICD-10-CM | POA: Diagnosis not present

## 2016-10-01 DIAGNOSIS — I509 Heart failure, unspecified: Secondary | ICD-10-CM | POA: Diagnosis not present

## 2016-10-01 DIAGNOSIS — I110 Hypertensive heart disease with heart failure: Secondary | ICD-10-CM | POA: Diagnosis not present

## 2016-10-01 DIAGNOSIS — I4891 Unspecified atrial fibrillation: Secondary | ICD-10-CM | POA: Diagnosis not present

## 2016-10-01 DIAGNOSIS — S72141D Displaced intertrochanteric fracture of right femur, subsequent encounter for closed fracture with routine healing: Secondary | ICD-10-CM | POA: Diagnosis not present

## 2016-10-01 DIAGNOSIS — Z9181 History of falling: Secondary | ICD-10-CM | POA: Diagnosis not present

## 2016-10-01 DIAGNOSIS — Z7901 Long term (current) use of anticoagulants: Secondary | ICD-10-CM | POA: Diagnosis not present

## 2016-10-02 DIAGNOSIS — S72141D Displaced intertrochanteric fracture of right femur, subsequent encounter for closed fracture with routine healing: Secondary | ICD-10-CM | POA: Diagnosis not present

## 2016-10-02 DIAGNOSIS — Z7901 Long term (current) use of anticoagulants: Secondary | ICD-10-CM | POA: Diagnosis not present

## 2016-10-02 DIAGNOSIS — I110 Hypertensive heart disease with heart failure: Secondary | ICD-10-CM | POA: Diagnosis not present

## 2016-10-02 DIAGNOSIS — Z9181 History of falling: Secondary | ICD-10-CM | POA: Diagnosis not present

## 2016-10-02 DIAGNOSIS — I509 Heart failure, unspecified: Secondary | ICD-10-CM | POA: Diagnosis not present

## 2016-10-02 DIAGNOSIS — I4891 Unspecified atrial fibrillation: Secondary | ICD-10-CM | POA: Diagnosis not present

## 2016-10-03 DIAGNOSIS — Z7901 Long term (current) use of anticoagulants: Secondary | ICD-10-CM | POA: Diagnosis not present

## 2016-10-03 DIAGNOSIS — I509 Heart failure, unspecified: Secondary | ICD-10-CM | POA: Diagnosis not present

## 2016-10-03 DIAGNOSIS — Z9181 History of falling: Secondary | ICD-10-CM | POA: Diagnosis not present

## 2016-10-03 DIAGNOSIS — I110 Hypertensive heart disease with heart failure: Secondary | ICD-10-CM | POA: Diagnosis not present

## 2016-10-03 DIAGNOSIS — S72141D Displaced intertrochanteric fracture of right femur, subsequent encounter for closed fracture with routine healing: Secondary | ICD-10-CM | POA: Diagnosis not present

## 2016-10-03 DIAGNOSIS — I4891 Unspecified atrial fibrillation: Secondary | ICD-10-CM | POA: Diagnosis not present

## 2016-10-04 DIAGNOSIS — S72141D Displaced intertrochanteric fracture of right femur, subsequent encounter for closed fracture with routine healing: Secondary | ICD-10-CM | POA: Diagnosis not present

## 2016-10-04 DIAGNOSIS — I4891 Unspecified atrial fibrillation: Secondary | ICD-10-CM | POA: Diagnosis not present

## 2016-10-04 DIAGNOSIS — I110 Hypertensive heart disease with heart failure: Secondary | ICD-10-CM | POA: Diagnosis not present

## 2016-10-04 DIAGNOSIS — Z9181 History of falling: Secondary | ICD-10-CM | POA: Diagnosis not present

## 2016-10-04 DIAGNOSIS — Z7901 Long term (current) use of anticoagulants: Secondary | ICD-10-CM | POA: Diagnosis not present

## 2016-10-04 DIAGNOSIS — I509 Heart failure, unspecified: Secondary | ICD-10-CM | POA: Diagnosis not present

## 2016-10-08 DIAGNOSIS — S72141D Displaced intertrochanteric fracture of right femur, subsequent encounter for closed fracture with routine healing: Secondary | ICD-10-CM | POA: Diagnosis not present

## 2016-10-08 DIAGNOSIS — Z7901 Long term (current) use of anticoagulants: Secondary | ICD-10-CM | POA: Diagnosis not present

## 2016-10-08 DIAGNOSIS — Z9181 History of falling: Secondary | ICD-10-CM | POA: Diagnosis not present

## 2016-10-08 DIAGNOSIS — I4891 Unspecified atrial fibrillation: Secondary | ICD-10-CM | POA: Diagnosis not present

## 2016-10-08 DIAGNOSIS — I110 Hypertensive heart disease with heart failure: Secondary | ICD-10-CM | POA: Diagnosis not present

## 2016-10-08 DIAGNOSIS — I509 Heart failure, unspecified: Secondary | ICD-10-CM | POA: Diagnosis not present

## 2016-10-09 DIAGNOSIS — K5010 Crohn's disease of large intestine without complications: Secondary | ICD-10-CM | POA: Diagnosis not present

## 2016-10-10 DIAGNOSIS — S72141D Displaced intertrochanteric fracture of right femur, subsequent encounter for closed fracture with routine healing: Secondary | ICD-10-CM | POA: Diagnosis not present

## 2016-10-11 DIAGNOSIS — I110 Hypertensive heart disease with heart failure: Secondary | ICD-10-CM | POA: Diagnosis not present

## 2016-10-11 DIAGNOSIS — I509 Heart failure, unspecified: Secondary | ICD-10-CM | POA: Diagnosis not present

## 2016-10-11 DIAGNOSIS — S72141D Displaced intertrochanteric fracture of right femur, subsequent encounter for closed fracture with routine healing: Secondary | ICD-10-CM | POA: Diagnosis not present

## 2016-10-11 DIAGNOSIS — Z9181 History of falling: Secondary | ICD-10-CM | POA: Diagnosis not present

## 2016-10-11 DIAGNOSIS — I4891 Unspecified atrial fibrillation: Secondary | ICD-10-CM | POA: Diagnosis not present

## 2016-10-11 DIAGNOSIS — Z7901 Long term (current) use of anticoagulants: Secondary | ICD-10-CM | POA: Diagnosis not present

## 2016-10-12 DIAGNOSIS — Z7901 Long term (current) use of anticoagulants: Secondary | ICD-10-CM | POA: Diagnosis not present

## 2016-10-12 DIAGNOSIS — I4891 Unspecified atrial fibrillation: Secondary | ICD-10-CM | POA: Diagnosis not present

## 2016-10-12 DIAGNOSIS — Z9181 History of falling: Secondary | ICD-10-CM | POA: Diagnosis not present

## 2016-10-12 DIAGNOSIS — I509 Heart failure, unspecified: Secondary | ICD-10-CM | POA: Diagnosis not present

## 2016-10-12 DIAGNOSIS — S72141D Displaced intertrochanteric fracture of right femur, subsequent encounter for closed fracture with routine healing: Secondary | ICD-10-CM | POA: Diagnosis not present

## 2016-10-12 DIAGNOSIS — I110 Hypertensive heart disease with heart failure: Secondary | ICD-10-CM | POA: Diagnosis not present

## 2016-10-15 DIAGNOSIS — Z780 Asymptomatic menopausal state: Secondary | ICD-10-CM | POA: Diagnosis not present

## 2016-10-15 DIAGNOSIS — I4891 Unspecified atrial fibrillation: Secondary | ICD-10-CM | POA: Diagnosis not present

## 2016-10-15 DIAGNOSIS — S72141D Displaced intertrochanteric fracture of right femur, subsequent encounter for closed fracture with routine healing: Secondary | ICD-10-CM | POA: Diagnosis not present

## 2016-10-15 DIAGNOSIS — I110 Hypertensive heart disease with heart failure: Secondary | ICD-10-CM | POA: Diagnosis not present

## 2016-10-15 DIAGNOSIS — R269 Unspecified abnormalities of gait and mobility: Secondary | ICD-10-CM | POA: Diagnosis not present

## 2016-10-15 DIAGNOSIS — I509 Heart failure, unspecified: Secondary | ICD-10-CM | POA: Diagnosis not present

## 2016-10-15 DIAGNOSIS — M810 Age-related osteoporosis without current pathological fracture: Secondary | ICD-10-CM | POA: Diagnosis not present

## 2016-10-15 DIAGNOSIS — Z9181 History of falling: Secondary | ICD-10-CM | POA: Diagnosis not present

## 2016-10-17 DIAGNOSIS — Z9181 History of falling: Secondary | ICD-10-CM | POA: Diagnosis not present

## 2016-10-17 DIAGNOSIS — R269 Unspecified abnormalities of gait and mobility: Secondary | ICD-10-CM | POA: Diagnosis not present

## 2016-10-17 DIAGNOSIS — I509 Heart failure, unspecified: Secondary | ICD-10-CM | POA: Diagnosis not present

## 2016-10-17 DIAGNOSIS — S72141D Displaced intertrochanteric fracture of right femur, subsequent encounter for closed fracture with routine healing: Secondary | ICD-10-CM | POA: Diagnosis not present

## 2016-10-17 DIAGNOSIS — I4891 Unspecified atrial fibrillation: Secondary | ICD-10-CM | POA: Diagnosis not present

## 2016-10-17 DIAGNOSIS — I110 Hypertensive heart disease with heart failure: Secondary | ICD-10-CM | POA: Diagnosis not present

## 2016-10-20 ENCOUNTER — Encounter (HOSPITAL_COMMUNITY): Payer: Self-pay | Admitting: Emergency Medicine

## 2016-10-20 ENCOUNTER — Emergency Department (HOSPITAL_COMMUNITY)
Admission: EM | Admit: 2016-10-20 | Discharge: 2016-10-20 | Disposition: A | Discharge status: Home / Self Care | Payer: Medicare Other | Attending: Emergency Medicine | Admitting: Emergency Medicine

## 2016-10-20 DIAGNOSIS — I110 Hypertensive heart disease with heart failure: Secondary | ICD-10-CM | POA: Insufficient documentation

## 2016-10-20 DIAGNOSIS — M5432 Sciatica, left side: Secondary | ICD-10-CM | POA: Diagnosis not present

## 2016-10-20 DIAGNOSIS — Z87891 Personal history of nicotine dependence: Secondary | ICD-10-CM | POA: Diagnosis not present

## 2016-10-20 DIAGNOSIS — I509 Heart failure, unspecified: Secondary | ICD-10-CM | POA: Diagnosis not present

## 2016-10-20 DIAGNOSIS — R1032 Left lower quadrant pain: Secondary | ICD-10-CM | POA: Diagnosis present

## 2016-10-20 DIAGNOSIS — Z79899 Other long term (current) drug therapy: Secondary | ICD-10-CM | POA: Insufficient documentation

## 2016-10-20 DIAGNOSIS — Z7901 Long term (current) use of anticoagulants: Secondary | ICD-10-CM | POA: Insufficient documentation

## 2016-10-20 MED ORDER — MORPHINE SULFATE (PF) 4 MG/ML IV SOLN
2.0000 mg | INTRAVENOUS | Status: DC | PRN
  Administered 2016-10-20 (×2): 2 mg via INTRAVENOUS
  Filled 2016-10-20 (×2): qty 1

## 2016-10-20 MED ORDER — HYDROCODONE-ACETAMINOPHEN 5-325 MG PO TABS: 1.0000 | ORAL_TABLET | Freq: Four times a day (QID) | ORAL | 0 refills | Status: DC | PRN

## 2016-10-20 NOTE — ED Provider Notes (Signed)
[facility] DEPT Provider Note   CSN: [id number] Arrival date & time: 10/20/16  1731     History   Chief Complaint Chief Complaint  Patient presents with  . Groin Pain  . Hip Pain    HPI Melissa Mcdaniel is a 83 y.o. female.  HPI Pt is complaining of soreness in her left groin area.   That started about two weeks ago.  She just recovered from hip surgery for a hip fx.   She has been doing therapy and seeing her orthopedist.  The right hip is recovering well but she is now having pain in her groin and hip.  She saw Dr [name] this last week.  She had an xray of her hip and was told she had some arthritis but suggested he see a spine doctor. SHe has been taking tylenol for the pain but is not helping.  She has an appointment this Tuesday scheduling.    Past Medical History:  Diagnosis Date  . A-fib ([place])   . Congestive heart failure (CHF) ([place]) 05/2016  . Crohn's disease ([place])   . Dysrhythmia    afib dx approx. 2016 per pt  . Hip fracture ([place])   . Hypertension     Patient Active Problem List   Diagnosis Date Noted  . Malnutrition of moderate degree 07/26/2016  . Closed right hip fracture, initial encounter ([place]) 07/24/2016  . Hypertension 07/24/2016  . A-fib ([place]) 07/24/2016  . Fall 07/24/2016  . Congestive heart failure (CHF) ([place]) 05/23/2016    Past Surgical History:  Procedure Laterality Date  . FEMUR IM NAIL Right 07/26/2016   Procedure: INTRAMEDULLARY (IM) NAIL FEMORAL;  Surgeon: Renette Butters, MD;  Location: [facility];  Service: Orthopedics;  Laterality: Right;  . FRACTURE SURGERY    . large intestine - partial removal  approx 1993   due to crohns    OB History    No data available       Home Medications    Prior to Admission medications   Medication Sig Start Date End Date Taking? Authorizing Provider  amiodarone (PACERONE) 200 MG tablet Take 200 mg by mouth daily.    Historical Provider, MD  apixaban (ELIQUIS) 2.5 MG TABS tablet Take 2.5 mg by  mouth 2 (two) times daily.    Historical Provider, MD  ARIPiprazole (ABILIFY) 5 MG tablet Take 5 mg by mouth daily.    Historical Provider, MD  baclofen (LIORESAL) 10 MG tablet Take 1 tablet (10 mg total) by mouth 3 (three) times daily as needed for muscle spasms. 07/26/16   Melissa Elizabeth Martensen III, PA-C  carvedilol (COREG) 3.125 MG tablet TAKES 1 TAB BY MOUTH TWICE DAILY 06/02/16   Historical Provider, MD  cholestyramine (QUESTRAN) 4 g packet Take 1 packet by mouth 2 (two) times daily. Use as directed 09/04/16   Historical Provider, MD  clonazePAM (KLONOPIN) 0.5 MG tablet TK 1 T PO BID Patient taking differently: Take 0.5 mg by mouth 2 (two) times daily. TK 1 T PO BID 07/30/16   Theodis Blaze, MD  diltiazem (DILACOR XR) 120 MG 24 hr capsule Take 1 capsule (120 mg total) by mouth daily. 09/14/16   Burnell Blanks, MD  furosemide (LASIX) 20 MG tablet TAKES 1 TABLET BY MOUTH AS NEEDED FOR EDEMA 05/16/16   Historical Provider, MD  hydrALAZINE (APRESOLINE) 25 MG tablet Take 25 mg by mouth 2 (two) times daily.    Historical Provider, MD  HYDROcodone-acetaminophen (NORCO/VICODIN) 5-325 MG tablet Take 1  tablet by mouth every 6 (six) hours as needed. 10/20/16   Dorie Rank, MD  latanoprost (XALATAN) 0.005 % ophthalmic solution Place 1 drop into both eyes at bedtime.    Historical Provider, MD  lisinopril (PRINIVIL,ZESTRIL) 2.5 MG tablet TAKES 2.5MG BY MOUTH ONCE DAILY 06/02/16   Historical Provider, MD  loperamide (IMODIUM) 2 MG capsule Take 4 mg by mouth 4 (four) times daily as needed for diarrhea or loose stools.    Historical Provider, MD  Multiple Vitamins-Iron (ONE-TABLET-DAILY/IRON PO) Take 1 tablet by mouth daily.    Historical Provider, MD  ranitidine (ZANTAC) 150 MG tablet Take 150 mg by mouth daily as needed for heartburn.    Historical Provider, MD    Family History Family History  Problem Relation Age of Onset  . Lung cancer Mother   . Alzheimer's disease Father   . Diabetes Maternal  Grandmother     Social History Social History  Substance Use Topics  . Smoking status: Former Smoker    Packs/day: 0.50    Years: 30.00    Types: Cigarettes    Quit date: 09/15/1975  . Smokeless tobacco: Never Used  . Alcohol use No     Allergies   Penicillins   Review of Systems Review of Systems  Constitutional: Negative for fever.  Gastrointestinal: Negative for abdominal pain and vomiting.  Genitourinary: Negative for dysuria.  All other systems reviewed and are negative.    Physical Exam Updated Vital Signs BP (!) 155/83   Pulse (!) 103   Resp 18   Ht 5' (1.524 m)   Wt 45.4 kg   SpO2 100%   BMI 19.53 kg/m   Physical Exam  Constitutional: No distress.  HENT:  Head: Normocephalic and atraumatic.  Right Ear: External ear normal.  Left Ear: External ear normal.  Eyes: Conjunctivae are normal. Right eye exhibits no discharge. Left eye exhibits no discharge. No scleral icterus.  Neck: Neck supple. No tracheal deviation present.  Cardiovascular: Normal rate, regular rhythm and intact distal pulses.   Pulmonary/Chest: Effort normal and breath sounds normal. No stridor. No respiratory distress. She has no wheezes. She has no rales.  Abdominal: Soft. Bowel sounds are normal. She exhibits no distension. There is no tenderness. There is no rebound and no guarding.  Musculoskeletal: She exhibits no edema or tenderness.  Tenderness to palpation in the left posterior hip buttock region  Neurological: She is alert. She has normal strength. No cranial nerve deficit (no facial droop, extraocular movements intact, no slurred speech) or sensory deficit. She exhibits normal muscle tone. She displays no seizure activity. Coordination normal.  5 out of 5 strength to plantarflexion and dorsiflexion bilaterally, normal sensation lower extremities  Skin: Skin is warm and dry. No rash noted. She is not diaphoretic.  Psychiatric: She has a normal mood and affect.  Nursing note and  vitals reviewed.    ED Treatments / Results    Procedures Procedures (including critical care time)  Medications Ordered in ED Medications  morphine 4 MG/ML injection 2 mg (2 mg Intravenous Given 10/20/16 1845)     Initial Impression / Assessment and Plan / ED Course  I have reviewed the triage vital signs and the nursing notes.  Pertinent labs & imaging results that were available during my care of the patient were reviewed by me and considered in my medical decision making (see chart for details).  Clinical Course as of Oct 20 1932  Sat Oct 20, 2016  1932 Sx improved with  treatment in the ED.  [JK]  1933 Feel ready to go home.  [JK]    Clinical Course User Index [JK] Dorie Rank, MD  patient's symptoms seem to be musculoskeletal in nature. There may be a radicular component. She had outpatient x-rays of her hip by her orthopedic doctor this week. She is scheduled to have an MRI this coming week. He do not feel that repeat imaging is necessary. Patient's not having any abdominal pain. No fevers or other symptoms to suggest infection  Discussed plan of treating the patient's pain in the emergency room. She has only been taking Tylenol at home. I can provide additional pain medications for her to take until she sees the spine specialist this week.   Final Clinical Impressions(s) / ED Diagnoses   Final diagnoses:  Sciatica of left side    New Prescriptions New Prescriptions   HYDROCODONE-ACETAMINOPHEN (NORCO/VICODIN) 5-325 MG TABLET    Take 1 tablet by mouth every 6 (six) hours as needed.     Dorie Rank, MD 10/20/16 [phone]

## 2016-10-20 NOTE — ED Triage Notes (Signed)
Pt states she had a rt hip fracture surgery in January, pt has doing physical therapy at home. Today she is c/o groin, left buttocks and right hip muscular pain. Pt reports in therapy she went from using a walker to a cane which has caused increased muscle pain. 8/10 pain.

## 2016-10-20 NOTE — Discharge Instructions (Signed)
Follow up with your doctor as planned, take the pain medications as needed, consider taking a stool softener since it the medication can cause constipation.

## 2016-10-22 DIAGNOSIS — S72141D Displaced intertrochanteric fracture of right femur, subsequent encounter for closed fracture with routine healing: Secondary | ICD-10-CM | POA: Diagnosis not present

## 2016-10-22 DIAGNOSIS — I4891 Unspecified atrial fibrillation: Secondary | ICD-10-CM | POA: Diagnosis not present

## 2016-10-22 DIAGNOSIS — R269 Unspecified abnormalities of gait and mobility: Secondary | ICD-10-CM | POA: Diagnosis not present

## 2016-10-22 DIAGNOSIS — Z9181 History of falling: Secondary | ICD-10-CM | POA: Diagnosis not present

## 2016-10-22 DIAGNOSIS — I509 Heart failure, unspecified: Secondary | ICD-10-CM | POA: Diagnosis not present

## 2016-10-22 DIAGNOSIS — I110 Hypertensive heart disease with heart failure: Secondary | ICD-10-CM | POA: Diagnosis not present

## 2016-10-23 DIAGNOSIS — M545 Low back pain: Secondary | ICD-10-CM | POA: Diagnosis not present

## 2016-10-24 DIAGNOSIS — Z8781 Personal history of (healed) traumatic fracture: Secondary | ICD-10-CM | POA: Diagnosis not present

## 2016-10-24 DIAGNOSIS — M25552 Pain in left hip: Secondary | ICD-10-CM | POA: Diagnosis not present

## 2016-10-24 DIAGNOSIS — F419 Anxiety disorder, unspecified: Secondary | ICD-10-CM | POA: Diagnosis not present

## 2016-10-24 DIAGNOSIS — F339 Major depressive disorder, recurrent, unspecified: Secondary | ICD-10-CM | POA: Diagnosis not present

## 2016-10-25 DIAGNOSIS — M25552 Pain in left hip: Secondary | ICD-10-CM | POA: Diagnosis not present

## 2016-10-25 DIAGNOSIS — I10 Essential (primary) hypertension: Secondary | ICD-10-CM | POA: Diagnosis not present

## 2016-10-25 DIAGNOSIS — M8448XA Pathological fracture, other site, initial encounter for fracture: Secondary | ICD-10-CM | POA: Diagnosis not present

## 2016-10-25 DIAGNOSIS — S32599A Other specified fracture of unspecified pubis, initial encounter for closed fracture: Secondary | ICD-10-CM | POA: Diagnosis not present

## 2016-10-30 DIAGNOSIS — R102 Pelvic and perineal pain: Secondary | ICD-10-CM | POA: Diagnosis not present

## 2016-11-07 DIAGNOSIS — R102 Pelvic and perineal pain: Secondary | ICD-10-CM | POA: Diagnosis not present

## 2016-11-27 DIAGNOSIS — S72141D Displaced intertrochanteric fracture of right femur, subsequent encounter for closed fracture with routine healing: Secondary | ICD-10-CM | POA: Diagnosis not present

## 2016-11-27 DIAGNOSIS — Z9181 History of falling: Secondary | ICD-10-CM | POA: Diagnosis not present

## 2016-11-27 DIAGNOSIS — I4891 Unspecified atrial fibrillation: Secondary | ICD-10-CM | POA: Diagnosis not present

## 2016-11-27 DIAGNOSIS — I110 Hypertensive heart disease with heart failure: Secondary | ICD-10-CM | POA: Diagnosis not present

## 2016-11-27 DIAGNOSIS — R269 Unspecified abnormalities of gait and mobility: Secondary | ICD-10-CM | POA: Diagnosis not present

## 2016-11-27 DIAGNOSIS — I509 Heart failure, unspecified: Secondary | ICD-10-CM | POA: Diagnosis not present

## 2016-11-29 DIAGNOSIS — S72141D Displaced intertrochanteric fracture of right femur, subsequent encounter for closed fracture with routine healing: Secondary | ICD-10-CM | POA: Diagnosis not present

## 2016-11-29 DIAGNOSIS — R269 Unspecified abnormalities of gait and mobility: Secondary | ICD-10-CM | POA: Diagnosis not present

## 2016-11-29 DIAGNOSIS — I110 Hypertensive heart disease with heart failure: Secondary | ICD-10-CM | POA: Diagnosis not present

## 2016-11-29 DIAGNOSIS — I4891 Unspecified atrial fibrillation: Secondary | ICD-10-CM | POA: Diagnosis not present

## 2016-11-29 DIAGNOSIS — Z9181 History of falling: Secondary | ICD-10-CM | POA: Diagnosis not present

## 2016-11-29 DIAGNOSIS — I509 Heart failure, unspecified: Secondary | ICD-10-CM | POA: Diagnosis not present

## 2016-11-30 DIAGNOSIS — M810 Age-related osteoporosis without current pathological fracture: Secondary | ICD-10-CM | POA: Diagnosis not present

## 2016-11-30 DIAGNOSIS — I4891 Unspecified atrial fibrillation: Secondary | ICD-10-CM | POA: Diagnosis not present

## 2016-11-30 DIAGNOSIS — S72141D Displaced intertrochanteric fracture of right femur, subsequent encounter for closed fracture with routine healing: Secondary | ICD-10-CM | POA: Diagnosis not present

## 2016-11-30 DIAGNOSIS — I10 Essential (primary) hypertension: Secondary | ICD-10-CM | POA: Diagnosis not present

## 2016-11-30 DIAGNOSIS — F339 Major depressive disorder, recurrent, unspecified: Secondary | ICD-10-CM | POA: Diagnosis not present

## 2016-11-30 DIAGNOSIS — I509 Heart failure, unspecified: Secondary | ICD-10-CM | POA: Diagnosis not present

## 2016-11-30 DIAGNOSIS — I110 Hypertensive heart disease with heart failure: Secondary | ICD-10-CM | POA: Diagnosis not present

## 2016-11-30 DIAGNOSIS — R269 Unspecified abnormalities of gait and mobility: Secondary | ICD-10-CM | POA: Diagnosis not present

## 2016-11-30 DIAGNOSIS — M25551 Pain in right hip: Secondary | ICD-10-CM | POA: Diagnosis not present

## 2016-11-30 DIAGNOSIS — E440 Moderate protein-calorie malnutrition: Secondary | ICD-10-CM | POA: Diagnosis not present

## 2016-11-30 DIAGNOSIS — F419 Anxiety disorder, unspecified: Secondary | ICD-10-CM | POA: Diagnosis not present

## 2016-11-30 DIAGNOSIS — Z9181 History of falling: Secondary | ICD-10-CM | POA: Diagnosis not present

## 2016-12-03 DIAGNOSIS — Z9181 History of falling: Secondary | ICD-10-CM | POA: Diagnosis not present

## 2016-12-03 DIAGNOSIS — I110 Hypertensive heart disease with heart failure: Secondary | ICD-10-CM | POA: Diagnosis not present

## 2016-12-03 DIAGNOSIS — I509 Heart failure, unspecified: Secondary | ICD-10-CM | POA: Diagnosis not present

## 2016-12-03 DIAGNOSIS — S72141D Displaced intertrochanteric fracture of right femur, subsequent encounter for closed fracture with routine healing: Secondary | ICD-10-CM | POA: Diagnosis not present

## 2016-12-03 DIAGNOSIS — I4891 Unspecified atrial fibrillation: Secondary | ICD-10-CM | POA: Diagnosis not present

## 2016-12-03 DIAGNOSIS — R269 Unspecified abnormalities of gait and mobility: Secondary | ICD-10-CM | POA: Diagnosis not present

## 2016-12-05 ENCOUNTER — Telehealth: Payer: Self-pay | Admitting: Cardiovascular Disease

## 2016-12-05 DIAGNOSIS — I509 Heart failure, unspecified: Secondary | ICD-10-CM | POA: Diagnosis not present

## 2016-12-05 DIAGNOSIS — R269 Unspecified abnormalities of gait and mobility: Secondary | ICD-10-CM | POA: Diagnosis not present

## 2016-12-05 DIAGNOSIS — I110 Hypertensive heart disease with heart failure: Secondary | ICD-10-CM | POA: Diagnosis not present

## 2016-12-05 DIAGNOSIS — Z9181 History of falling: Secondary | ICD-10-CM | POA: Diagnosis not present

## 2016-12-05 DIAGNOSIS — S72141D Displaced intertrochanteric fracture of right femur, subsequent encounter for closed fracture with routine healing: Secondary | ICD-10-CM | POA: Diagnosis not present

## 2016-12-05 DIAGNOSIS — I4891 Unspecified atrial fibrillation: Secondary | ICD-10-CM | POA: Diagnosis not present

## 2016-12-05 NOTE — Telephone Encounter (Signed)
error 

## 2016-12-09 NOTE — Progress Notes (Signed)
Chief Complaint  Patient presents with  . Follow-up    atrial fibrillation     History of Present Illness: 83 yo female with history of HTN, CAD, chronic systolic CHF, atrial fibrillation, Crohn's disease, colon cancer who is here today for cardiac follow up. I saw her as a new patient in February 2018. She had recently moved from [facility] to [place] to be near her daughter. Her cardiac history includes atrial fibrillation. She has been on Eliquis. Records indicate that she had DCCV in December 2015 and was started on amiodarone at that time. Her most recent echo in November 2017 at [facility] showed LVEF=35-40% with global HK, moderate MR, mild to moderate TR. She had a cardiac cath in 2002 showing 30% ostial RCA stenosis. She has had colon cancer and has had a colon resection. No evidence of carotid disease by dopplers 2010. Last stress test 2012 did not show ischemia.    She is here today for follow up. She was added onto my schedule after a phone call from her home heatlh nurse last week noting "irregular heartbeat". She reports weight loss, fatigue, lack of energy. No dizziness, chest pain, dyspnea, palpitations, lower extremity edema, orthopnea, PND, near syncope or syncope. She is feeling depressed She thinks she is losing weight due to issues with her dentures and poor po intake.   Primary Care Physician: Harlan Stains, MD  Past Medical History:  Diagnosis Date  . A-fib ([place])   . Congestive heart failure (CHF) ([place]) 05/2016  . Crohn's disease ([place])   . Dysrhythmia    afib dx approx. 2016 per pt  . Hip fracture ([place])   . Hypertension     Past Surgical History:  Procedure Laterality Date  . FEMUR IM NAIL Right 07/26/2016   Procedure: INTRAMEDULLARY (IM) NAIL FEMORAL;  Surgeon: Renette Butters, MD;  Location: [facility];  Service: Orthopedics;  Laterality: Right;  . FRACTURE SURGERY    . large intestine - partial removal  approx 1993   due to crohns    Current Outpatient  Prescriptions  Medication Sig Dispense Refill  . amiodarone (PACERONE) 200 MG tablet Take 200 mg by mouth daily.    Marland Kitchen apixaban (ELIQUIS) 2.5 MG TABS tablet Take 2.5 mg by mouth 2 (two) times daily.    . carvedilol (COREG) 3.125 MG tablet TAKES 1 TAB BY MOUTH TWICE DAILY  0  . cholestyramine (QUESTRAN) 4 g packet Take 1 packet by mouth 2 (two) times daily. Use as directed    . clonazePAM (KLONOPIN) 0.5 MG tablet TK 1 T PO BID (Patient taking differently: Take 0.5 mg by mouth 2 (two) times daily. TK 1 T PO BID) 30 tablet 0  . diltiazem (DILACOR XR) 120 MG 24 hr capsule Take 1 capsule (120 mg total) by mouth daily. 90 capsule 3  . furosemide (LASIX) 20 MG tablet TAKES 1 TABLET BY MOUTH AS NEEDED FOR EDEMA  0  . hydrALAZINE (APRESOLINE) 25 MG tablet Take 25 mg by mouth 2 (two) times daily.    Marland Kitchen HYDROcodone-acetaminophen (NORCO/VICODIN) 5-325 MG tablet Take 1 tablet by mouth every 6 (six) hours as needed. 16 tablet 0  . latanoprost (XALATAN) 0.005 % ophthalmic solution Place 1 drop into both eyes at bedtime.    Marland Kitchen lisinopril (PRINIVIL,ZESTRIL) 2.5 MG tablet TAKES 2.5MG BY MOUTH ONCE DAILY  0  . loperamide (IMODIUM) 2 MG capsule Take 4 mg by mouth 4 (four) times daily as needed for diarrhea or loose stools.    Marland Kitchen  megestrol (MEGACE) 40 MG/ML suspension Take 5 mg by mouth daily.  0  . mirtazapine (REMERON) 15 MG tablet Take 15 mg by mouth daily.    . Multiple Vitamins-Iron (ONE-TABLET-DAILY/IRON PO) Take 1 tablet by mouth daily.    . ranitidine (ZANTAC) 150 MG tablet Take 150 mg by mouth daily as needed for heartburn.     No current facility-administered medications for this visit.     Allergies  Allergen Reactions  . Penicillins Swelling    Swelling of mouth and tongue    Social History   Social History  . Marital status: Married    Spouse name: N/A  . Number of children: 3  . Years of education: N/A   Occupational History  . Worked at [employer]    Social History Main Topics  .  Smoking status: Former Smoker    Packs/day: 0.50    Years: 30.00    Types: Cigarettes    Quit date: 09/15/1975  . Smokeless tobacco: Never Used  . Alcohol use No  . Drug use: No  . Sexual activity: Not on file   Other Topics Concern  . Not on file   Social History Narrative  . No narrative on file    Family History  Problem Relation Age of Onset  . Lung cancer Mother   . Alzheimer's disease Father   . Diabetes Maternal Grandmother     Review of Systems:  As stated in the HPI and otherwise negative.   BP (!) 100/58   Pulse (!) 58   Ht 5' (1.524 m)   Wt 95 lb (43.1 kg)   SpO2 98%   BMI 18.55 kg/m   Physical Examination:  General: Well developed, well nourished, NAD  HEENT: OP clear, mucus membranes moist  SKIN: warm, dry. No rashes. Neuro: No focal deficits  Musculoskeletal: Muscle strength 5/5 all ext  Psychiatric: Mood and affect normal  Neck: No JVD, no carotid bruits, no thyromegaly, no lymphadenopathy.  Lungs:Clear bilaterally, no wheezes, rhonci, crackles Cardiovascular: Irreg irreg with systolic murmur noted. No gallops or rubs. Abdomen:Soft. Bowel sounds present. Non-tender.  Extremities: No lower extremity edema. Pulses are 2 + in the bilateral DP/PT.  EKG:  EKG is  ordered today. The ekg ordered today demonstrates Atrial fibrillation, rate 109 bpm. LVH  Recent Labs: 08/16/2016: ALT 15; BUN 21; Creatinine, Ser 1.32; Hemoglobin 9.6; Platelets 295; Potassium 3.8; Sodium 135   Lipid Panel No results found for: CHOL, TRIG, HDL, CHOLHDL, VLDL, LDLCALC, LDLDIRECT   Wt Readings from Last 3 Encounters:  12/10/16 95 lb (43.1 kg)  10/20/16 100 lb (45.4 kg)  09/14/16 103 lb 3.2 oz (46.8 kg)     Other studies Reviewed: Additional studies/ records that were reviewed today include: all old records. Review of the above records demonstrates:   Assessment and Plan:   1. Atrial fibrillation,persistent: She is in atrial fibrillation today. Rate is controlled.  Will continue amiodarone, Cardizem and Coreg. She is anti-coagulated with Eliquis. She is up to date on eye exams, yearly chest x-ray and thyroid testing.    2. Chronic diastolic CHF: Weight is stable and actually decreasing due to recent reduced po intake. No LE edema. Continue to use Lasix as needed.   3. HTN: BP controlled. No changes.   4. CAD without angina: She is known to have mild disease by cath in 2002. She has no chest pain suggestive of angina. Continue current meds.    Current medicines are reviewed at length with the  patient today.  The patient does not have concerns regarding medicines.  The following changes have been made:  no change  Labs/ tests ordered today include:   No orders of the defined types were placed in this encounter.    Disposition:   FU with me in 6 months   Signed, Lauree Chandler, MD 12/10/2016 10:17 AM    [facility] [place], [place], [place]  [address] Phone: [phone]; Fax: [phone]

## 2016-12-10 ENCOUNTER — Ambulatory Visit (INDEPENDENT_AMBULATORY_CARE_PROVIDER_SITE_OTHER): Payer: Medicare Other | Admitting: Cardiovascular Disease

## 2016-12-10 ENCOUNTER — Encounter (INDEPENDENT_AMBULATORY_CARE_PROVIDER_SITE_OTHER): Payer: Self-pay

## 2016-12-10 ENCOUNTER — Encounter: Payer: Self-pay | Admitting: Cardiovascular Disease

## 2016-12-10 VITALS — BP 100/58 | HR 58 | Ht 60.0 in | Wt 95.0 lb

## 2016-12-10 DIAGNOSIS — I481 Persistent atrial fibrillation: Secondary | ICD-10-CM | POA: Diagnosis not present

## 2016-12-10 DIAGNOSIS — I5032 Chronic diastolic (congestive) heart failure: Secondary | ICD-10-CM | POA: Diagnosis not present

## 2016-12-10 DIAGNOSIS — I10 Essential (primary) hypertension: Secondary | ICD-10-CM

## 2016-12-10 DIAGNOSIS — I2510 Atherosclerotic heart disease of native coronary artery without angina pectoris: Secondary | ICD-10-CM | POA: Diagnosis not present

## 2016-12-10 DIAGNOSIS — I4819 Other persistent atrial fibrillation: Secondary | ICD-10-CM

## 2016-12-10 NOTE — Patient Instructions (Signed)

## 2016-12-11 DIAGNOSIS — I110 Hypertensive heart disease with heart failure: Secondary | ICD-10-CM | POA: Diagnosis not present

## 2016-12-11 DIAGNOSIS — I509 Heart failure, unspecified: Secondary | ICD-10-CM | POA: Diagnosis not present

## 2016-12-11 DIAGNOSIS — Z9181 History of falling: Secondary | ICD-10-CM | POA: Diagnosis not present

## 2016-12-11 DIAGNOSIS — I4891 Unspecified atrial fibrillation: Secondary | ICD-10-CM | POA: Diagnosis not present

## 2016-12-11 DIAGNOSIS — R269 Unspecified abnormalities of gait and mobility: Secondary | ICD-10-CM | POA: Diagnosis not present

## 2016-12-11 DIAGNOSIS — S72141D Displaced intertrochanteric fracture of right femur, subsequent encounter for closed fracture with routine healing: Secondary | ICD-10-CM | POA: Diagnosis not present

## 2016-12-12 DIAGNOSIS — Z9181 History of falling: Secondary | ICD-10-CM | POA: Diagnosis not present

## 2016-12-12 DIAGNOSIS — S72141D Displaced intertrochanteric fracture of right femur, subsequent encounter for closed fracture with routine healing: Secondary | ICD-10-CM | POA: Diagnosis not present

## 2016-12-12 DIAGNOSIS — R269 Unspecified abnormalities of gait and mobility: Secondary | ICD-10-CM | POA: Diagnosis not present

## 2016-12-12 DIAGNOSIS — I110 Hypertensive heart disease with heart failure: Secondary | ICD-10-CM | POA: Diagnosis not present

## 2016-12-12 DIAGNOSIS — I4891 Unspecified atrial fibrillation: Secondary | ICD-10-CM | POA: Diagnosis not present

## 2016-12-12 DIAGNOSIS — I509 Heart failure, unspecified: Secondary | ICD-10-CM | POA: Diagnosis not present

## 2016-12-19 DIAGNOSIS — M170 Bilateral primary osteoarthritis of knee: Secondary | ICD-10-CM | POA: Diagnosis not present

## 2016-12-19 DIAGNOSIS — M25551 Pain in right hip: Secondary | ICD-10-CM | POA: Diagnosis not present

## 2016-12-21 DIAGNOSIS — S72141D Displaced intertrochanteric fracture of right femur, subsequent encounter for closed fracture with routine healing: Secondary | ICD-10-CM | POA: Diagnosis not present

## 2016-12-21 DIAGNOSIS — Z9181 History of falling: Secondary | ICD-10-CM | POA: Diagnosis not present

## 2016-12-21 DIAGNOSIS — F339 Major depressive disorder, recurrent, unspecified: Secondary | ICD-10-CM | POA: Diagnosis not present

## 2016-12-21 DIAGNOSIS — I4891 Unspecified atrial fibrillation: Secondary | ICD-10-CM | POA: Diagnosis not present

## 2016-12-21 DIAGNOSIS — R269 Unspecified abnormalities of gait and mobility: Secondary | ICD-10-CM | POA: Diagnosis not present

## 2016-12-21 DIAGNOSIS — I509 Heart failure, unspecified: Secondary | ICD-10-CM | POA: Diagnosis not present

## 2016-12-21 DIAGNOSIS — R61 Generalized hyperhidrosis: Secondary | ICD-10-CM | POA: Diagnosis not present

## 2016-12-21 DIAGNOSIS — I110 Hypertensive heart disease with heart failure: Secondary | ICD-10-CM | POA: Diagnosis not present

## 2016-12-21 DIAGNOSIS — E440 Moderate protein-calorie malnutrition: Secondary | ICD-10-CM | POA: Diagnosis not present

## 2016-12-24 DIAGNOSIS — R269 Unspecified abnormalities of gait and mobility: Secondary | ICD-10-CM | POA: Diagnosis not present

## 2016-12-24 DIAGNOSIS — Z9181 History of falling: Secondary | ICD-10-CM | POA: Diagnosis not present

## 2016-12-24 DIAGNOSIS — I509 Heart failure, unspecified: Secondary | ICD-10-CM | POA: Diagnosis not present

## 2016-12-24 DIAGNOSIS — S72141D Displaced intertrochanteric fracture of right femur, subsequent encounter for closed fracture with routine healing: Secondary | ICD-10-CM | POA: Diagnosis not present

## 2016-12-24 DIAGNOSIS — I110 Hypertensive heart disease with heart failure: Secondary | ICD-10-CM | POA: Diagnosis not present

## 2016-12-24 DIAGNOSIS — I4891 Unspecified atrial fibrillation: Secondary | ICD-10-CM | POA: Diagnosis not present

## 2016-12-26 DIAGNOSIS — R269 Unspecified abnormalities of gait and mobility: Secondary | ICD-10-CM | POA: Diagnosis not present

## 2016-12-26 DIAGNOSIS — I4891 Unspecified atrial fibrillation: Secondary | ICD-10-CM | POA: Diagnosis not present

## 2016-12-26 DIAGNOSIS — I110 Hypertensive heart disease with heart failure: Secondary | ICD-10-CM | POA: Diagnosis not present

## 2016-12-26 DIAGNOSIS — Z9181 History of falling: Secondary | ICD-10-CM | POA: Diagnosis not present

## 2016-12-26 DIAGNOSIS — I509 Heart failure, unspecified: Secondary | ICD-10-CM | POA: Diagnosis not present

## 2016-12-26 DIAGNOSIS — S72141D Displaced intertrochanteric fracture of right femur, subsequent encounter for closed fracture with routine healing: Secondary | ICD-10-CM | POA: Diagnosis not present

## 2016-12-31 DIAGNOSIS — I4891 Unspecified atrial fibrillation: Secondary | ICD-10-CM | POA: Diagnosis not present

## 2016-12-31 DIAGNOSIS — I509 Heart failure, unspecified: Secondary | ICD-10-CM | POA: Diagnosis not present

## 2016-12-31 DIAGNOSIS — R269 Unspecified abnormalities of gait and mobility: Secondary | ICD-10-CM | POA: Diagnosis not present

## 2016-12-31 DIAGNOSIS — I110 Hypertensive heart disease with heart failure: Secondary | ICD-10-CM | POA: Diagnosis not present

## 2016-12-31 DIAGNOSIS — S72141D Displaced intertrochanteric fracture of right femur, subsequent encounter for closed fracture with routine healing: Secondary | ICD-10-CM | POA: Diagnosis not present

## 2016-12-31 DIAGNOSIS — Z9181 History of falling: Secondary | ICD-10-CM | POA: Diagnosis not present

## 2017-01-04 DIAGNOSIS — Z9181 History of falling: Secondary | ICD-10-CM | POA: Diagnosis not present

## 2017-01-04 DIAGNOSIS — I110 Hypertensive heart disease with heart failure: Secondary | ICD-10-CM | POA: Diagnosis not present

## 2017-01-04 DIAGNOSIS — S72141D Displaced intertrochanteric fracture of right femur, subsequent encounter for closed fracture with routine healing: Secondary | ICD-10-CM | POA: Diagnosis not present

## 2017-01-04 DIAGNOSIS — I509 Heart failure, unspecified: Secondary | ICD-10-CM | POA: Diagnosis not present

## 2017-01-04 DIAGNOSIS — R269 Unspecified abnormalities of gait and mobility: Secondary | ICD-10-CM | POA: Diagnosis not present

## 2017-01-04 DIAGNOSIS — I4891 Unspecified atrial fibrillation: Secondary | ICD-10-CM | POA: Diagnosis not present

## 2017-01-08 DIAGNOSIS — S72141D Displaced intertrochanteric fracture of right femur, subsequent encounter for closed fracture with routine healing: Secondary | ICD-10-CM | POA: Diagnosis not present

## 2017-01-08 DIAGNOSIS — Z9181 History of falling: Secondary | ICD-10-CM | POA: Diagnosis not present

## 2017-01-08 DIAGNOSIS — I509 Heart failure, unspecified: Secondary | ICD-10-CM | POA: Diagnosis not present

## 2017-01-08 DIAGNOSIS — I110 Hypertensive heart disease with heart failure: Secondary | ICD-10-CM | POA: Diagnosis not present

## 2017-01-08 DIAGNOSIS — R269 Unspecified abnormalities of gait and mobility: Secondary | ICD-10-CM | POA: Diagnosis not present

## 2017-01-08 DIAGNOSIS — I4891 Unspecified atrial fibrillation: Secondary | ICD-10-CM | POA: Diagnosis not present

## 2017-01-09 DIAGNOSIS — M7061 Trochanteric bursitis, right hip: Secondary | ICD-10-CM | POA: Diagnosis not present

## 2017-01-10 ENCOUNTER — Other Ambulatory Visit: Payer: Self-pay | Admitting: *Deleted

## 2017-01-10 MED ORDER — AMIODARONE HCL 200 MG PO TABS: 200.0000 mg | ORAL_TABLET | Freq: Every day | ORAL | 3 refills | Status: DC

## 2017-01-24 DIAGNOSIS — H5212 Myopia, left eye: Secondary | ICD-10-CM | POA: Diagnosis not present

## 2017-01-24 DIAGNOSIS — H401131 Primary open-angle glaucoma, bilateral, mild stage: Secondary | ICD-10-CM | POA: Diagnosis not present

## 2017-01-24 DIAGNOSIS — H5201 Hypermetropia, right eye: Secondary | ICD-10-CM | POA: Diagnosis not present

## 2017-01-24 DIAGNOSIS — H52223 Regular astigmatism, bilateral: Secondary | ICD-10-CM | POA: Diagnosis not present

## 2017-01-28 DIAGNOSIS — E440 Moderate protein-calorie malnutrition: Secondary | ICD-10-CM | POA: Diagnosis not present

## 2017-01-28 DIAGNOSIS — F339 Major depressive disorder, recurrent, unspecified: Secondary | ICD-10-CM | POA: Diagnosis not present

## 2017-01-28 DIAGNOSIS — M7061 Trochanteric bursitis, right hip: Secondary | ICD-10-CM | POA: Diagnosis not present

## 2017-01-28 DIAGNOSIS — R42 Dizziness and giddiness: Secondary | ICD-10-CM | POA: Diagnosis not present

## 2017-01-31 DIAGNOSIS — K5010 Crohn's disease of large intestine without complications: Secondary | ICD-10-CM | POA: Diagnosis not present

## 2017-02-04 NOTE — Addendum Note (Signed)
Addendum  created 02/04/17 1444 by Annye Asa, MD   Sign clinical note

## 2017-02-04 NOTE — Anesthesia Postprocedure Evaluation (Deleted)
Anesthesia Post Note  Patient: Melissa Mcdaniel  Procedure(s) Performed: Procedure(s) (LRB): INTRAMEDULLARY (IM) NAIL FEMORAL (Right)     Anesthesia Post Evaluation  Last Vitals:  Vitals:   07/30/16 0533 07/30/16 1300  BP: (!) 144/49 (!) 126/57  Pulse: (!) 115 (!) 112  Resp: 16 17  Temp: 36.6 C 36.5 C    Last Pain:  Vitals:   07/30/16 1300  TempSrc: Oral  PainSc:                  Jay Kempe,E. Beckett Hickmon

## 2017-02-04 NOTE — Addendum Note (Signed)
Addendum  created 02/04/17 1705 by Annye Asa, MD   Delete clinical note, Sign clinical note

## 2017-02-06 DIAGNOSIS — M7061 Trochanteric bursitis, right hip: Secondary | ICD-10-CM | POA: Diagnosis not present

## 2017-02-11 DIAGNOSIS — M7061 Trochanteric bursitis, right hip: Secondary | ICD-10-CM | POA: Diagnosis not present

## 2017-02-15 ENCOUNTER — Ambulatory Visit
Admission: RE | Admit: 2017-02-15 | Discharge: 2017-02-15 | Disposition: A | Discharge status: Home / Self Care | Payer: Medicare Other | Source: Ambulatory Visit | Attending: Orthopedic Surgery | Admitting: Orthopedic Surgery

## 2017-02-15 ENCOUNTER — Other Ambulatory Visit: Payer: Self-pay | Admitting: Orthopedic Surgery

## 2017-02-15 DIAGNOSIS — M8430XA Stress fracture, unspecified site, initial encounter for fracture: Secondary | ICD-10-CM

## 2017-02-15 DIAGNOSIS — M25551 Pain in right hip: Secondary | ICD-10-CM

## 2017-02-18 DIAGNOSIS — M25551 Pain in right hip: Secondary | ICD-10-CM | POA: Diagnosis not present

## 2017-02-21 ENCOUNTER — Other Ambulatory Visit: Payer: Self-pay | Admitting: Orthopedic Surgery

## 2017-02-21 DIAGNOSIS — D72829 Elevated white blood cell count, unspecified: Secondary | ICD-10-CM | POA: Diagnosis not present

## 2017-02-21 DIAGNOSIS — M25551 Pain in right hip: Secondary | ICD-10-CM

## 2017-02-27 ENCOUNTER — Telehealth: Payer: Self-pay | Admitting: Pharmacist

## 2017-02-27 NOTE — Telephone Encounter (Signed)
Received clearance request from [facility] with request for pt to hold Eliquis for 2 days prior to right hip injection. Pt takes Eliquis for afib with CHADS2 score of 3 (HTN, CHF, age) and CHADS2VASc of 6 (HTN, CHF, age x2, sex, CAD). Ok ho hold Eliquis for 2 days prior to procedure. Clearance faxed to [phone].

## 2017-03-06 DIAGNOSIS — N3000 Acute cystitis without hematuria: Secondary | ICD-10-CM | POA: Diagnosis not present

## 2017-03-13 ENCOUNTER — Ambulatory Visit
Admission: RE | Admit: 2017-03-13 | Discharge: 2017-03-13 | Disposition: A | Discharge status: Home / Self Care | Payer: Medicare Other | Source: Ambulatory Visit | Attending: Orthopedic Surgery | Admitting: Orthopedic Surgery

## 2017-03-13 DIAGNOSIS — M25551 Pain in right hip: Secondary | ICD-10-CM

## 2017-03-13 MED ORDER — METHYLPREDNISOLONE ACETATE 40 MG/ML INJ SUSP (RADIOLOG
120.0000 mg | Freq: Once | INTRAMUSCULAR | Status: AC
  Administered 2017-03-13: 120 mg via INTRA_ARTICULAR

## 2017-03-13 MED ORDER — IOPAMIDOL (ISOVUE-M 200) INJECTION 41%
1.0000 mL | Freq: Once | INTRAMUSCULAR | Status: AC
  Administered 2017-03-13: 1 mL via INTRA_ARTICULAR

## 2017-03-13 NOTE — Discharge Instructions (Signed)

## 2017-03-15 ENCOUNTER — Ambulatory Visit: Payer: Medicare Other | Admitting: Cardiovascular Disease

## 2017-03-20 DIAGNOSIS — I10 Essential (primary) hypertension: Secondary | ICD-10-CM | POA: Diagnosis not present

## 2017-03-20 DIAGNOSIS — E440 Moderate protein-calorie malnutrition: Secondary | ICD-10-CM | POA: Diagnosis not present

## 2017-03-20 DIAGNOSIS — F339 Major depressive disorder, recurrent, unspecified: Secondary | ICD-10-CM | POA: Diagnosis not present

## 2017-03-20 DIAGNOSIS — M25551 Pain in right hip: Secondary | ICD-10-CM | POA: Diagnosis not present

## 2017-03-20 DIAGNOSIS — I480 Paroxysmal atrial fibrillation: Secondary | ICD-10-CM | POA: Diagnosis not present

## 2017-03-20 DIAGNOSIS — L570 Actinic keratosis: Secondary | ICD-10-CM | POA: Diagnosis not present

## 2017-03-20 DIAGNOSIS — N289 Disorder of kidney and ureter, unspecified: Secondary | ICD-10-CM | POA: Diagnosis not present

## 2017-03-20 DIAGNOSIS — K50914 Crohn's disease, unspecified, with abscess: Secondary | ICD-10-CM | POA: Diagnosis not present

## 2017-03-20 DIAGNOSIS — M810 Age-related osteoporosis without current pathological fracture: Secondary | ICD-10-CM | POA: Diagnosis not present

## 2017-03-20 DIAGNOSIS — Z0000 Encounter for general adult medical examination without abnormal findings: Secondary | ICD-10-CM | POA: Diagnosis not present

## 2017-04-04 DIAGNOSIS — N289 Disorder of kidney and ureter, unspecified: Secondary | ICD-10-CM | POA: Diagnosis not present

## 2017-04-08 ENCOUNTER — Ambulatory Visit (INDEPENDENT_AMBULATORY_CARE_PROVIDER_SITE_OTHER): Payer: Medicare Other | Admitting: Cardiovascular Disease

## 2017-04-08 VITALS — BP 132/70 | HR 63 | Ht 60.0 in | Wt 101.8 lb

## 2017-04-08 DIAGNOSIS — I428 Other cardiomyopathies: Secondary | ICD-10-CM | POA: Diagnosis not present

## 2017-04-08 DIAGNOSIS — I4819 Other persistent atrial fibrillation: Secondary | ICD-10-CM

## 2017-04-08 DIAGNOSIS — I10 Essential (primary) hypertension: Secondary | ICD-10-CM | POA: Diagnosis not present

## 2017-04-08 DIAGNOSIS — I481 Persistent atrial fibrillation: Secondary | ICD-10-CM

## 2017-04-08 DIAGNOSIS — I5042 Chronic combined systolic (congestive) and diastolic (congestive) heart failure: Secondary | ICD-10-CM

## 2017-04-08 DIAGNOSIS — I2510 Atherosclerotic heart disease of native coronary artery without angina pectoris: Secondary | ICD-10-CM

## 2017-04-08 NOTE — Patient Instructions (Signed)
Medication Instructions:  Your physician has recommended you make the following change in your medication:  Stop Cardizem    Labwork: none  Testing/Procedures: none  Follow-Up: Your physician recommends that you schedule a follow-up appointment in: 6 months. Please call our office in about 3 months to schedule this appointment.     Any Other Special Instructions Will Be Listed Below (If Applicable).     If you need a refill on your cardiac medications before your next appointment, please call your pharmacy.

## 2017-04-08 NOTE — Progress Notes (Signed)
Chief Complaint  Patient presents with  . Follow-up    atrial fibrillation     History of Present Illness: 83 yo female with history of HTN, CAD, chronic systolic CHF, persistent atrial fibrillation, Crohn's disease, colon cancer who is here today for cardiac follow up. I saw her as a new patient in February 2018. She had recently moved from [facility] to [place] to be near her daughter. Her cardiac history includes atrial fibrillation. She has been on Eliquis. Records indicate that she had DCCV in December 2015 and was started on amiodarone at that time. Her most recent echo in November 2017 at [facility] showed LVEF=35-40% with global HK, moderate MR, mild to moderate TR. She had a cardiac cath in 2002 showing 30% ostial RCA stenosis. She has had colon cancer and has had a colon resection. No evidence of carotid disease by dopplers 2010. Last stress test 2012 did not show ischemia.  Last visit her in May 2018 and she reported weight loss, fatigue. She was having issues with her dentures and was not eating well.   She is here today for follow up of her CAD and persistent atrial fibrillation. The patient denies any chest pain, dyspnea, palpitations, lower extremity edema, orthopnea, PND, dizziness, near syncope or syncope.    Primary Care Physician: Harlan Stains, MD  Past Medical History:  Diagnosis Date  . A-fib ([place])   . Congestive heart failure (CHF) ([place]) 05/2016  . Crohn's disease ([place])   . Dysrhythmia    afib dx approx. 2016 per pt  . Hip fracture ([place])   . Hypertension     Past Surgical History:  Procedure Laterality Date  . FEMUR IM NAIL Right 07/26/2016   Procedure: INTRAMEDULLARY (IM) NAIL FEMORAL;  Surgeon: Renette Butters, MD;  Location: [facility];  Service: Orthopedics;  Laterality: Right;  . FRACTURE SURGERY    . large intestine - partial removal  approx 1993   due to crohns    Current Outpatient Prescriptions  Medication Sig Dispense Refill  . amiodarone  (PACERONE) 200 MG tablet Take 1 tablet (200 mg total) by mouth daily. 90 tablet 3  . apixaban (ELIQUIS) 2.5 MG TABS tablet Take 2.5 mg by mouth 2 (two) times daily.    . carvedilol (COREG) 3.125 MG tablet TAKES 1 TAB BY MOUTH TWICE DAILY  0  . cholestyramine (QUESTRAN) 4 g packet Take 1 packet by mouth 2 (two) times daily. Use as directed    . clonazePAM (KLONOPIN) 0.5 MG tablet TK 1 T PO BID (Patient taking differently: Take 0.5 mg by mouth 2 (two) times daily. TK 1 T PO BID) 30 tablet 0  . furosemide (LASIX) 20 MG tablet TAKES 1 TABLET BY MOUTH AS NEEDED FOR EDEMA  0  . hydrALAZINE (APRESOLINE) 25 MG tablet Take 25 mg by mouth 2 (two) times daily.    Marland Kitchen HYDROcodone-acetaminophen (NORCO/VICODIN) 5-325 MG tablet Take 1 tablet by mouth every 6 (six) hours as needed. 16 tablet 0  . latanoprost (XALATAN) 0.005 % ophthalmic solution Place 1 drop into both eyes at bedtime.    Marland Kitchen loperamide (IMODIUM) 2 MG capsule Take 4 mg by mouth 4 (four) times daily as needed for diarrhea or loose stools.    . mirtazapine (REMERON) 15 MG tablet Take 15 mg by mouth daily.    . Multiple Vitamins-Iron (ONE-TABLET-DAILY/IRON PO) Take 1 tablet by mouth daily.    . ranitidine (ZANTAC) 150 MG tablet Take 150 mg by mouth daily as  needed for heartburn.    . VENLAFAXINE HCL PO Take 150 mg by mouth 2 (two) times daily.     No current facility-administered medications for this visit.     Allergies  Allergen Reactions  . Penicillins Swelling    Swelling of mouth and tongue    Social History   Social History  . Marital status: Married    Spouse name: N/A  . Number of children: 3  . Years of education: N/A   Occupational History  . Worked at [employer]    Social History Main Topics  . Smoking status: Former Smoker    Packs/day: 0.50    Years: 30.00    Types: Cigarettes    Quit date: 09/15/1975  . Smokeless tobacco: Never Used  . Alcohol use No  . Drug use: No  . Sexual activity: Not on file   Other  Topics Concern  . Not on file   Social History Narrative  . No narrative on file    Family History  Problem Relation Age of Onset  . Lung cancer Mother   . Alzheimer's disease Father   . Diabetes Maternal Grandmother     Review of Systems:  As stated in the HPI and otherwise negative.   BP 132/70   Pulse 63   Ht 5' (1.524 m)   Wt 101 lb 12.8 oz (46.2 kg)   SpO2 97%   BMI 19.88 kg/m   Physical Examination:  General: Well developed, well nourished, NAD  HEENT: OP clear, mucus membranes moist  SKIN: warm, dry. No rashes. Neuro: No focal deficits  Musculoskeletal: Muscle strength 5/5 all ext  Psychiatric: Mood and affect normal  Neck: No JVD, no carotid bruits, no thyromegaly, no lymphadenopathy.  Lungs:Clear bilaterally, no wheezes, rhonci, crackles Cardiovascular: Irreg irreg. Soft systolic murmur.  Abdomen:Soft. Bowel sounds present. Non-tender.  Extremities: No lower extremity edema. Pulses are 2 + in the bilateral DP/PT.  EKG:  EKG is  not ordered today. The ekg ordered today demonstrates   Recent Labs: 08/16/2016: ALT 15; BUN 21; Creatinine, Ser 1.32; Hemoglobin 9.6; Platelets 295; Potassium 3.8; Sodium 135   Lipid Panel No results found for: CHOL, TRIG, HDL, CHOLHDL, VLDL, LDLCALC, LDLDIRECT   Wt Readings from Last 3 Encounters:  04/08/17 101 lb 12.8 oz (46.2 kg)  12/10/16 95 lb (43.1 kg)  10/20/16 100 lb (45.4 kg)     Other studies Reviewed: Additional studies/ records that were reviewed today include: all old records. Review of the above records demonstrates:   Assessment and Plan:   1. Atrial fibrillation,persistent: She is known to have persistent atrial fibrillation. She is rate controlled on amiodarone, Coreg and cardizem. Given her LV systolic dysfunction, I think we should stop her Cardizem. I will stop the Cardizem today. She is current on eye exams and chest x-rays. She will need TSH and LFT's checked prior to her next visit here. I will ask her  to show her paperwork to Dr. Dema Severin and if possible she can have this done with her yearly lab work.    2. Chronic combined systolic and diastolic CHF: Weight stable. No LE edema. Will continue Lasix as needed.    3. HTN: BP controlled. No changes.   4. CAD without angina: Mild CAD by cath in 2002. No chest pain suggestive of angina. Continue beta blocker. She is not on an ASA since she is on Eliquis.   5. Non-ischemic cardiomyopathy: Her LVEF was 35-40% by echo in November 2017 at  [facility] continue Coreg. Her Ace-inh has been stopped because of renal issues.   Current medicines are reviewed at length with the patient today.  The patient does not have concerns regarding medicines.  The following changes have been made:  no change  Labs/ tests ordered today include:   No orders of the defined types were placed in this encounter.    Disposition:   FU with me in 6 months   Signed, Lauree Chandler, MD 04/08/2017 12:21 PM    [facility] [place], [place], [place]  [address] Phone: [phone]; Fax: [phone]

## 2017-04-19 DIAGNOSIS — N289 Disorder of kidney and ureter, unspecified: Secondary | ICD-10-CM | POA: Diagnosis not present

## 2017-05-13 ENCOUNTER — Other Ambulatory Visit: Payer: Self-pay | Admitting: *Deleted

## 2017-05-13 MED ORDER — APIXABAN 2.5 MG PO TABS: 2.5000 mg | ORAL_TABLET | Freq: Two times a day (BID) | ORAL | 1 refills | Status: DC

## 2017-05-13 NOTE — Telephone Encounter (Signed)
Pt last seen by Dr Angelena Form 04/08/17, last labs 04/19/17 Creat 1.7, age 83, weight 46.2kg, based on specified criteria pt is on appropriate dosage of Eliquis 2.76m BID.   But CrCl is 18.61, kidney function is worsening, will send message to Dr MAngelena Formto address if pt should continue on Eliquis 2.538mBID given decline in kidney function. Will await response to refill rx.

## 2017-05-13 NOTE — Telephone Encounter (Signed)
Dr Angelena Form wishes for pt to continue on Eliquis 2.71m BID.  Will refill rx.

## 2017-05-15 DIAGNOSIS — M25551 Pain in right hip: Secondary | ICD-10-CM | POA: Diagnosis not present

## 2017-05-16 DIAGNOSIS — M546 Pain in thoracic spine: Secondary | ICD-10-CM | POA: Diagnosis not present

## 2017-05-16 DIAGNOSIS — F339 Major depressive disorder, recurrent, unspecified: Secondary | ICD-10-CM | POA: Diagnosis not present

## 2017-05-16 DIAGNOSIS — R0600 Dyspnea, unspecified: Secondary | ICD-10-CM | POA: Diagnosis not present

## 2017-05-16 DIAGNOSIS — M25551 Pain in right hip: Secondary | ICD-10-CM | POA: Diagnosis not present

## 2017-05-16 DIAGNOSIS — M7061 Trochanteric bursitis, right hip: Secondary | ICD-10-CM | POA: Diagnosis not present

## 2017-05-16 DIAGNOSIS — I129 Hypertensive chronic kidney disease with stage 1 through stage 4 chronic kidney disease, or unspecified chronic kidney disease: Secondary | ICD-10-CM | POA: Diagnosis not present

## 2017-05-17 ENCOUNTER — Ambulatory Visit
Admission: RE | Admit: 2017-05-17 | Discharge: 2017-05-17 | Disposition: A | Discharge status: Home / Self Care | Payer: Medicare Other | Source: Ambulatory Visit | Attending: Family Medicine | Admitting: Family Medicine

## 2017-05-17 ENCOUNTER — Other Ambulatory Visit: Payer: Self-pay | Admitting: Family Medicine

## 2017-05-17 DIAGNOSIS — M546 Pain in thoracic spine: Secondary | ICD-10-CM | POA: Diagnosis not present

## 2017-05-17 DIAGNOSIS — R0600 Dyspnea, unspecified: Secondary | ICD-10-CM

## 2017-05-17 DIAGNOSIS — J439 Emphysema, unspecified: Secondary | ICD-10-CM | POA: Diagnosis not present

## 2017-05-24 DIAGNOSIS — L814 Other melanin hyperpigmentation: Secondary | ICD-10-CM | POA: Diagnosis not present

## 2017-05-24 DIAGNOSIS — Z85828 Personal history of other malignant neoplasm of skin: Secondary | ICD-10-CM | POA: Diagnosis not present

## 2017-05-24 DIAGNOSIS — L570 Actinic keratosis: Secondary | ICD-10-CM | POA: Diagnosis not present

## 2017-05-24 DIAGNOSIS — Z08 Encounter for follow-up examination after completed treatment for malignant neoplasm: Secondary | ICD-10-CM | POA: Diagnosis not present

## 2017-06-20 DIAGNOSIS — N183 Chronic kidney disease, stage 3 (moderate): Secondary | ICD-10-CM | POA: Diagnosis not present

## 2017-06-20 DIAGNOSIS — I129 Hypertensive chronic kidney disease with stage 1 through stage 4 chronic kidney disease, or unspecified chronic kidney disease: Secondary | ICD-10-CM | POA: Diagnosis not present

## 2017-06-20 DIAGNOSIS — N179 Acute kidney failure, unspecified: Secondary | ICD-10-CM | POA: Diagnosis not present

## 2017-06-20 DIAGNOSIS — N390 Urinary tract infection, site not specified: Secondary | ICD-10-CM | POA: Diagnosis not present

## 2017-06-21 DIAGNOSIS — M25551 Pain in right hip: Secondary | ICD-10-CM | POA: Diagnosis not present

## 2017-06-25 ENCOUNTER — Other Ambulatory Visit: Payer: Self-pay | Admitting: Nephrology

## 2017-06-25 DIAGNOSIS — N183 Chronic kidney disease, stage 3 unspecified: Secondary | ICD-10-CM

## 2017-06-26 ENCOUNTER — Ambulatory Visit
Admission: RE | Admit: 2017-06-26 | Discharge: 2017-06-26 | Disposition: A | Discharge status: Home / Self Care | Payer: Medicare Other | Source: Ambulatory Visit | Attending: Family Medicine | Admitting: Family Medicine

## 2017-06-26 ENCOUNTER — Other Ambulatory Visit: Payer: Self-pay | Admitting: Family Medicine

## 2017-06-26 DIAGNOSIS — R0609 Other forms of dyspnea: Secondary | ICD-10-CM | POA: Diagnosis not present

## 2017-06-26 DIAGNOSIS — I480 Paroxysmal atrial fibrillation: Secondary | ICD-10-CM | POA: Diagnosis not present

## 2017-06-26 DIAGNOSIS — R0602 Shortness of breath: Secondary | ICD-10-CM | POA: Diagnosis not present

## 2017-06-26 DIAGNOSIS — E440 Moderate protein-calorie malnutrition: Secondary | ICD-10-CM | POA: Diagnosis not present

## 2017-06-26 DIAGNOSIS — N183 Chronic kidney disease, stage 3 (moderate): Secondary | ICD-10-CM | POA: Diagnosis not present

## 2017-06-26 DIAGNOSIS — I129 Hypertensive chronic kidney disease with stage 1 through stage 4 chronic kidney disease, or unspecified chronic kidney disease: Secondary | ICD-10-CM | POA: Diagnosis not present

## 2017-06-26 DIAGNOSIS — F339 Major depressive disorder, recurrent, unspecified: Secondary | ICD-10-CM | POA: Diagnosis not present

## 2017-06-27 ENCOUNTER — Telehealth: Payer: Self-pay

## 2017-06-27 NOTE — Telephone Encounter (Signed)
SENT NOTES TO SCHEDULING PT NEED SOONER APPT.Marland KitchenMarland Kitchen

## 2017-07-04 ENCOUNTER — Emergency Department (HOSPITAL_COMMUNITY): Payer: Medicare Other

## 2017-07-04 ENCOUNTER — Telehealth: Payer: Self-pay | Admitting: Cardiology

## 2017-07-04 ENCOUNTER — Emergency Department (HOSPITAL_COMMUNITY)
Admission: EM | Admit: 2017-07-04 | Discharge: 2017-07-05 | Disposition: A | Discharge status: Home / Self Care | Payer: Medicare Other | Attending: Emergency Medicine | Admitting: Emergency Medicine

## 2017-07-04 ENCOUNTER — Encounter (HOSPITAL_COMMUNITY): Payer: Self-pay | Admitting: Emergency Medicine

## 2017-07-04 ENCOUNTER — Ambulatory Visit (INDEPENDENT_AMBULATORY_CARE_PROVIDER_SITE_OTHER): Payer: Medicare Other | Admitting: Cardiovascular Disease

## 2017-07-04 VITALS — BP 132/72 | HR 88 | Ht 60.0 in | Wt 108.4 lb

## 2017-07-04 DIAGNOSIS — I509 Heart failure, unspecified: Secondary | ICD-10-CM | POA: Diagnosis not present

## 2017-07-04 DIAGNOSIS — R0602 Shortness of breath: Secondary | ICD-10-CM | POA: Diagnosis not present

## 2017-07-04 DIAGNOSIS — I481 Persistent atrial fibrillation: Secondary | ICD-10-CM

## 2017-07-04 DIAGNOSIS — I110 Hypertensive heart disease with heart failure: Secondary | ICD-10-CM | POA: Insufficient documentation

## 2017-07-04 DIAGNOSIS — Z7901 Long term (current) use of anticoagulants: Secondary | ICD-10-CM | POA: Diagnosis not present

## 2017-07-04 DIAGNOSIS — I428 Other cardiomyopathies: Secondary | ICD-10-CM

## 2017-07-04 DIAGNOSIS — Z87891 Personal history of nicotine dependence: Secondary | ICD-10-CM | POA: Insufficient documentation

## 2017-07-04 DIAGNOSIS — I10 Essential (primary) hypertension: Secondary | ICD-10-CM | POA: Diagnosis not present

## 2017-07-04 DIAGNOSIS — I5042 Chronic combined systolic (congestive) and diastolic (congestive) heart failure: Secondary | ICD-10-CM | POA: Diagnosis not present

## 2017-07-04 DIAGNOSIS — Z79899 Other long term (current) drug therapy: Secondary | ICD-10-CM | POA: Diagnosis not present

## 2017-07-04 DIAGNOSIS — R7989 Other specified abnormal findings of blood chemistry: Secondary | ICD-10-CM | POA: Diagnosis not present

## 2017-07-04 DIAGNOSIS — I4819 Other persistent atrial fibrillation: Secondary | ICD-10-CM

## 2017-07-04 DIAGNOSIS — I2510 Atherosclerotic heart disease of native coronary artery without angina pectoris: Secondary | ICD-10-CM

## 2017-07-04 NOTE — Telephone Encounter (Signed)
Received a call from [place] that patient's Ddimer was elevated at 0.7. In review of office notes, the patient has been very dyspneic over the past several weeks. Plan per Dr. Camillia Herter note was to check Ddimer, BNP and echo. I called to talk with the patient and her husband. Patient reports her breathing has been labored, but no chest pain. I discussed the need for further evaluation in the ED with possible CTA to rule out PE. Long discussion with the husband in regards to why the lab was drawn and possible implications. Of note she has been on Eliquis as a outpatient, therefore PE suspicion is low, but none the less, asked that they go to the ED to be evaluated given ongoing dyspnea. They were agreeable to this plan.

## 2017-07-04 NOTE — Patient Instructions (Signed)
Medication Instructions:  Your physician recommends that you continue on your current medications as directed. Please refer to the Current Medication list given to you today.   Labwork: Lab work to be done today--D-dimer (STAT), BNP, CMET, TSH, CBC  Testing/Procedures: Your physician has requested that you have an echocardiogram. Echocardiography is a painless test that uses sound waves to create images of your heart. It provides your doctor with information about the size and shape of your heart and how well your heart's chambers and valves are working. This procedure takes approximately one hour. There are no restrictions for this procedure.  Scheduled for December 14,2018 at 3:00  Follow-Up: Your physician recommends that you schedule a follow-up appointment in: one week with PA or NP.   Any Other Special Instructions Will Be Listed Below (If Applicable).     If you need a refill on your cardiac medications before your next appointment, please call your pharmacy.

## 2017-07-04 NOTE — ED Triage Notes (Signed)
Pt presents to ED after having a workup done with Dr. Angelena Form which had a positive (0.7) d-dimer.  Patient has been having increased SOB with exertion at home x 3 weeks.  Denies chest pain.  Recent chest xray.  NAD at triage.

## 2017-07-04 NOTE — Progress Notes (Signed)
Chief Complaint  Patient presents with  . Follow-up    atiral fib  . Shortness of Breath     History of Present Illness: 83 yo female with history of HTN, CAD, chronic systolic CHF, persistent atrial fibrillation, Crohn's disease, colon cancer who is here today for cardiac follow up. I saw her as a new patient in February 2018. She had recently moved from [facility] to [place] to be near her daughter. Her cardiac history includes atrial fibrillation. She has been on Eliquis. Records indicate that she had DCCV in December 2015 and was started on amiodarone at that time. Her most recent echo in November 2017 at [facility] showed LVEF=35-40% with global HK, moderate MR, mild to moderate TR. She had a cardiac cath in 2002 showing 30% ostial RCA stenosis. She has had colon cancer and has had a colon resection. No evidence of carotid disease by dopplers 2010. Last stress test 2012 did not show ischemia.  Last visit here in May 2018 and she reported weight loss, fatigue. She was having issues with her dentures and was not eating well.   She is here today for follow up. She is added onto my schedule today with c/o of dyspnea. This has been present for the last 6 weeks. She was seen in primary care by Dr. Dema Severin one week ago with this complaint. No cough,fever, chills. NO sick contacts. No LE edema or abdominal swelling. Chest x-ray 06/26/17 with no evidence of pleural effusion, pulmonary edema or pneumonia. She has not been taking Lasix. Weight is up but she has been trying to eat more and has been on Megace.     Primary Care Physician: Harlan Stains, MD  Past Medical History:  Diagnosis Date  . A-fib ([place])   . Congestive heart failure (CHF) ([place]) 05/2016  . Crohn's disease ([place])   . Dysrhythmia    afib dx approx. 2016 per pt  . Hip fracture ([place])   . Hypertension     Past Surgical History:  Procedure Laterality Date  . FEMUR IM NAIL Right 07/26/2016   Procedure: INTRAMEDULLARY (IM) NAIL  FEMORAL;  Surgeon: Renette Butters, MD;  Location: [facility];  Service: Orthopedics;  Laterality: Right;  . FRACTURE SURGERY    . large intestine - partial removal  approx 1993   due to crohns    Current Outpatient Medications  Medication Sig Dispense Refill  . alendronate (FOSAMAX) 70 MG tablet Take 70 mg by mouth daily.    Marland Kitchen amiodarone (PACERONE) 200 MG tablet Take 1 tablet (200 mg total) by mouth daily. 90 tablet 3  . apixaban (ELIQUIS) 2.5 MG TABS tablet Take 1 tablet (2.5 mg total) by mouth 2 (two) times daily. 180 tablet 1  . carvedilol (COREG) 3.125 MG tablet TAKES 1 TAB BY MOUTH TWICE DAILY  0  . clonazePAM (KLONOPIN) 0.5 MG tablet TK 1 T PO BID (Patient taking differently: Take 0.5 mg by mouth 2 (two) times daily. TK 1 T PO BID) 30 tablet 0  . furosemide (LASIX) 20 MG tablet TAKES 1 TABLET BY MOUTH AS NEEDED FOR EDEMA  0  . hydrALAZINE (APRESOLINE) 25 MG tablet Take 25 mg by mouth 2 (two) times daily.    Marland Kitchen latanoprost (XALATAN) 0.005 % ophthalmic solution Place 1 drop into both eyes at bedtime.    Marland Kitchen loperamide (IMODIUM) 2 MG capsule Take 4 mg by mouth 4 (four) times daily as needed for diarrhea or loose stools.    . mirtazapine (  REMERON) 15 MG tablet Take 15 mg by mouth daily.    . Multiple Vitamins-Iron (ONE-TABLET-DAILY/IRON PO) Take 1 tablet by mouth daily.    . ranitidine (ZANTAC) 150 MG tablet Take 150 mg by mouth daily as needed for heartburn.    . VENLAFAXINE HCL PO Take 150 mg by mouth 2 (two) times daily.     No current facility-administered medications for this visit.     Allergies  Allergen Reactions  . Penicillins Swelling    Swelling of mouth and tongue    Social History   Socioeconomic History  . Marital status: Married    Spouse name: Not on file  . Number of children: 3  . Years of education: Not on file  . Highest education level: Not on file  Social Needs  . Financial resource strain: Not on file  . Food insecurity - worry: Not on file  . Food  insecurity - inability: Not on file  . Transportation needs - medical: Not on file  . Transportation needs - non-medical: Not on file  Occupational History  . Occupation: Worked at [employer]  . Smoking status: Former Smoker    Packs/day: 0.50    Years: 30.00    Pack years: 15.00    Types: Cigarettes    Last attempt to quit: 09/15/1975    Years since quitting: 41.8  . Smokeless tobacco: Never Used  Substance and Sexual Activity  . Alcohol use: No  . Drug use: No  . Sexual activity: Not on file  Other Topics Concern  . Not on file  Social History Narrative  . Not on file    Family History  Problem Relation Age of Onset  . Lung cancer Mother   . Alzheimer's disease Father   . Diabetes Maternal Grandmother     Review of Systems:  As stated in the HPI and otherwise negative.   BP 132/72   Pulse 88   Ht 5' (1.524 m)   Wt 108 lb 6.4 oz (49.2 kg)   SpO2 98%   BMI 21.17 kg/m   Physical Examination:  General: Well developed, well nourished, NAD  HEENT: OP clear, mucus membranes moist  SKIN: warm, dry. No rashes. Neuro: No focal deficits  Musculoskeletal: Muscle strength 5/5 all ext  Psychiatric: Mood and affect normal  Neck: No JVD, no carotid bruits, no thyromegaly, no lymphadenopathy.  Lungs:Clear bilaterally, no wheezes, rhonci, crackles Cardiovascular: Regular rate and rhythm. No murmurs, gallops or rubs. Abdomen:Soft. Bowel sounds present. Non-tender.  Extremities: No lower extremity edema. Pulses are 2 + in the bilateral DP/PT.  EKG:  EKG is ordered today. The ekg ordered today demonstrates Sinus, rate 88 bpm.   Recent Labs: 08/16/2016: ALT 15; BUN 21; Creatinine, Ser 1.32; Hemoglobin 9.6; Platelets 295; Potassium 3.8; Sodium 135   Lipid Panel No results found for: CHOL, TRIG, HDL, CHOLHDL, VLDL, LDLCALC, LDLDIRECT   Wt Readings from Last 3 Encounters:  07/04/17 108 lb 6.4 oz (49.2 kg)  04/08/17 101 lb 12.8 oz (46.2 kg)  12/10/16 95 lb  (43.1 kg)     Other studies Reviewed: Additional studies/ records that were reviewed today include: all old records. Review of the above records demonstrates:   Assessment and Plan:   1. Atrial fibrillation,persistent: She is rate controlled today on amiodarone and Coreg. She is current on eye exams and chest x-rays. She will need a TSH and LFTs now.   2. Chronic combined systolic and diastolic CHF: Her weight is  up today by 7 lbs over the last 3 months but she has no evidence of volume overload on exam. Her dyspnea does not seem to be related to fluid overload.   3. HTN: BP is controlled. No changes.   4. CAD without angina: Mild CAD by cath in 2002. She is not having chest pain. Will continue beta blocker. No ASA since she is on Eliquis and has no coronary stents.   5. Non-ischemic cardiomyopathy: Her LVEF was 35-40% by echo in November 2017 at [facility]. No Ace-inh/ARB due to renal insufficiency.  With recent onset of dyspnea, will arrange echo now.   6. Dyspnea: She has no chest pain or EKG changes to suggest ischemia. Her lungs are clear and no volume overload on exam. She has been on Eliquis so less likely to be a PE but will check D-dimer. Check CBC. Check BNP. Repeat echo now to assess her valves (known to have mild to moderate MR and TR) and LV function. This could be a pulmonary issue.   Current medicines are reviewed at length with the patient today.  The patient does not have concerns regarding medicines.  The following changes have been made:  no change  Labs/ tests ordered today include:   Orders Placed This Encounter  Procedures  . Pro b natriuretic peptide  . D-dimer, quantitative (not at [facility])  . CBC w/Diff  . Comp Met (CMET)  . TSH  . EKG 12-Lead  . ECHOCARDIOGRAM COMPLETE     Disposition:   FU with me in 6 months   Signed, Lauree Chandler, MD 07/04/2017 2:53 PM    [address],  [place], [place]  [address] Phone: [phone]; Fax: [phone]

## 2017-07-05 ENCOUNTER — Ambulatory Visit (HOSPITAL_BASED_OUTPATIENT_CLINIC_OR_DEPARTMENT_OTHER): Payer: Medicare Other

## 2017-07-05 ENCOUNTER — Other Ambulatory Visit: Payer: Self-pay | Admitting: *Deleted

## 2017-07-05 ENCOUNTER — Encounter: Payer: Self-pay | Admitting: *Deleted

## 2017-07-05 ENCOUNTER — Other Ambulatory Visit: Payer: Self-pay

## 2017-07-05 ENCOUNTER — Emergency Department (HOSPITAL_COMMUNITY): Payer: Medicare Other

## 2017-07-05 DIAGNOSIS — I5042 Chronic combined systolic (congestive) and diastolic (congestive) heart failure: Secondary | ICD-10-CM | POA: Diagnosis not present

## 2017-07-05 DIAGNOSIS — R0602 Shortness of breath: Secondary | ICD-10-CM | POA: Diagnosis not present

## 2017-07-05 DIAGNOSIS — R7989 Other specified abnormal findings of blood chemistry: Secondary | ICD-10-CM | POA: Diagnosis not present

## 2017-07-05 DIAGNOSIS — I428 Other cardiomyopathies: Secondary | ICD-10-CM | POA: Diagnosis not present

## 2017-07-05 LAB — COMPREHENSIVE METABOLIC PANEL
ALK PHOS: 88 IU/L (ref 39–117)
ALT: 12 IU/L (ref 0–32)
AST: 18 IU/L (ref 0–40)
Albumin/Globulin Ratio: 1.3 (ref 1.2–2.2)
Albumin: 3.9 g/dL (ref 3.5–4.7)
BUN/Creatinine Ratio: 15 (ref 12–28)
BUN: 26 mg/dL (ref 8–27)
CHLORIDE: 101 mmol/L (ref 96–106)
CO2: 22 mmol/L (ref 20–29)
Calcium: 9 mg/dL (ref 8.7–10.3)
Creatinine, Ser: 1.73 mg/dL — ABNORMAL HIGH (ref 0.57–1.00)
GFR calc non Af Amer: 27 mL/min/{1.73_m2} — ABNORMAL LOW (ref >59)
GFR, EST AFRICAN AMERICAN: 31 mL/min/{1.73_m2} — AB (ref >59)
GLUCOSE: 87 mg/dL (ref 65–99)
Globulin, Total: 2.9 g/dL (ref 1.5–4.5)
POTASSIUM: 4.8 mmol/L (ref 3.5–5.2)
Sodium: 139 mmol/L (ref 134–144)
TOTAL PROTEIN: 6.8 g/dL (ref 6.0–8.5)

## 2017-07-05 LAB — I-STAT CHEM 8, ED
BUN: 33 mg/dL — ABNORMAL HIGH (ref 6–20)
Calcium, Ion: 1.11 mmol/L — ABNORMAL LOW (ref 1.15–1.40)
Chloride: 104 mmol/L (ref 101–111)
Creatinine, Ser: 1.6 mg/dL — ABNORMAL HIGH (ref 0.44–1.00)
Glucose, Bld: 91 mg/dL (ref 65–99)
HCT: 35 % — ABNORMAL LOW (ref 36.0–46.0)
Hemoglobin: 11.9 g/dL — ABNORMAL LOW (ref 12.0–15.0)
Potassium: 4.7 mmol/L (ref 3.5–5.1)
Sodium: 138 mmol/L (ref 135–145)
TCO2: 25 mmol/L (ref 22–32)

## 2017-07-05 LAB — TSH: TSH: 5.56 u[IU]/mL — AB (ref 0.450–4.500)

## 2017-07-05 LAB — PRO B NATRIURETIC PEPTIDE: NT-PRO BNP: 4095 pg/mL — AB (ref 0–738)

## 2017-07-05 LAB — CBC WITH DIFFERENTIAL/PLATELET
BASOS ABS: 0.1 10*3/uL (ref 0.0–0.2)
Basos: 1 %
EOS (ABSOLUTE): 0.5 10*3/uL — AB (ref 0.0–0.4)
Eos: 7 %
Hematocrit: 33.7 % — ABNORMAL LOW (ref 34.0–46.6)
Hemoglobin: 10.2 g/dL — ABNORMAL LOW (ref 11.1–15.9)
IMMATURE GRANS (ABS): 0.1 10*3/uL (ref 0.0–0.1)
Immature Granulocytes: 1 %
LYMPHS: 15 %
Lymphocytes Absolute: 1.1 10*3/uL (ref 0.7–3.1)
MCH: 26.2 pg — AB (ref 26.6–33.0)
MCHC: 30.3 g/dL — ABNORMAL LOW (ref 31.5–35.7)
MCV: 86 fL (ref 79–97)
MONOS ABS: 0.9 10*3/uL (ref 0.1–0.9)
Monocytes: 13 %
NEUTROS ABS: 4.6 10*3/uL (ref 1.4–7.0)
NEUTROS PCT: 63 %
PLATELETS: 382 10*3/uL — AB (ref 150–379)
RBC: 3.9 x10E6/uL (ref 3.77–5.28)
RDW: 14 % (ref 12.3–15.4)
WBC: 7.2 10*3/uL (ref 3.4–10.8)

## 2017-07-05 LAB — D-DIMER, QUANTITATIVE: D-DIMER: 0.77 mg/L FEU — ABNORMAL HIGH (ref 0.00–0.49)

## 2017-07-05 LAB — ECHOCARDIOGRAM COMPLETE

## 2017-07-05 MED ORDER — IOPAMIDOL (ISOVUE-370) INJECTION 76%
INTRAVENOUS | Status: AC
  Administered 2017-07-05: 100 mL
  Filled 2017-07-05: qty 100

## 2017-07-05 MED ORDER — FUROSEMIDE 20 MG PO TABS: ORAL_TABLET | ORAL | 0 refills | Status: DC

## 2017-07-05 NOTE — ED Notes (Signed)
This Rn attempted Iv access twice without success, Iv team consulted

## 2017-07-05 NOTE — ED Notes (Signed)
IV team at bedside 

## 2017-07-05 NOTE — ED Provider Notes (Signed)
[facility] EMERGENCY DEPARTMENT Provider Note   CSN: [id number] Arrival date & time: 07/04/17  1947     History   Chief Complaint Chief Complaint  Patient presents with  . Abnormal Lab    HPI Melissa Mcdaniel is a 83 y.o. female.  HPI Patient presents to the emergency department with progressive shortness of breath over the last 6 weeks.  She was seen by her cardiologist today who sent off a d-dimer which came back elevated.  Patient states that she has not had any chest pain, leg pain, leg swelling or recent immobilization.  Patient also denies any recent surgery patient states that the shortness of breath seems to be mostly around activity but does occur at other times.  Patient states that her cardiologist also felt this was a blood clot in her lung with the elevated test he sent her in for further evaluation.  The patient denies chest pain, headache,blurred vision, neck pain, fever, cough, weakness, numbness, dizziness, anorexia, edema, abdominal pain, nausea, vomiting, diarrhea, rash, back pain, dysuria, hematemesis, bloody stool, near syncope, or syncope. Past Medical History:  Diagnosis Date  . A-fib ([place])   . Congestive heart failure (CHF) ([place]) 05/2016  . Crohn's disease ([place])   . Dysrhythmia    afib dx approx. 2016 per pt  . Hip fracture ([place])   . Hypertension     Patient Active Problem List   Diagnosis Date Noted  . Malnutrition of moderate degree 07/26/2016  . Closed right hip fracture, initial encounter ([place]) 07/24/2016  . Hypertension 07/24/2016  . A-fib ([place]) 07/24/2016  . Fall 07/24/2016  . Congestive heart failure (CHF) ([place]) 05/23/2016    Past Surgical History:  Procedure Laterality Date  . FEMUR IM NAIL Right 07/26/2016   Procedure: INTRAMEDULLARY (IM) NAIL FEMORAL;  Surgeon: Renette Butters, MD;  Location: [facility];  Service: Orthopedics;  Laterality: Right;  . FRACTURE SURGERY    . large intestine - partial removal  approx 1993   due to crohns    OB History    No data available       Home Medications    Prior to Admission medications   Medication Sig Start Date End Date Taking? Authorizing Provider  alendronate (FOSAMAX) 70 MG tablet Take 70 mg by mouth daily. 06/10/17  Yes [provider]  amiodarone (PACERONE) 200 MG tablet Take 1 tablet (200 mg total) by mouth daily. 01/10/17  Yes Burnell Blanks, MD  apixaban (ELIQUIS) 2.5 MG TABS tablet Take 1 tablet (2.5 mg total) by mouth 2 (two) times daily. 05/13/17  Yes Burnell Blanks, MD  carvedilol (COREG) 6.25 MG tablet TAKES 6.25 mg TAB BY MOUTH TWICE DAILY 06/02/16  Yes [provider]  clonazePAM (KLONOPIN) 0.5 MG tablet TK 1 T PO BID Patient taking differently: Take 0.5 mg by mouth 2 (two) times daily. TK 1 T PO BID 07/30/16  Yes Theodis Blaze, MD  furosemide (LASIX) 20 MG tablet TAKES 20 mg TABLET BY MOUTH AS NEEDED FOR EDEMA 05/16/16  Yes [provider]  hydrALAZINE (APRESOLINE) 25 MG tablet Take 25 mg by mouth 2 (two) times daily.   Yes [provider]  latanoprost (XALATAN) 0.005 % ophthalmic solution Place 1 drop into both eyes at bedtime.   Yes [provider]  loperamide (IMODIUM) 2 MG capsule Take 4 mg by mouth 4 (four) times daily as needed for diarrhea or loose stools.   Yes [provider]  mirtazapine (REMERON) 15  MG tablet Take 15 mg by mouth at bedtime.    Yes [provider]  Multiple Vitamins-Iron (ONE-TABLET-DAILY/IRON PO) Take 1 tablet by mouth daily.   Yes [provider]  ranitidine (ZANTAC) 150 MG tablet Take 150 mg by mouth daily as needed for heartburn.   Yes [provider]  venlafaxine XR (EFFEXOR-XR) 150 MG 24 hr capsule Take 150 mg by mouth 2 (two) times daily.   Yes [provider]    Family History Family History  Problem Relation Age of Onset  . Lung cancer Mother   . Alzheimer's disease Father   . Diabetes Maternal  Grandmother     Social History Social History   Tobacco Use  . Smoking status: Former Smoker    Packs/day: 0.50    Years: 30.00    Pack years: 15.00    Types: Cigarettes    Last attempt to quit: 09/15/1975    Years since quitting: 41.8  . Smokeless tobacco: Never Used  Substance Use Topics  . Alcohol use: No  . Drug use: No     Allergies   Penicillins   Review of Systems Review of Systems All other systems negative except as documented in the HPI. All pertinent positives and negatives as reviewed in the HPI.  Physical Exam Updated Vital Signs BP (!) 186/77   Pulse 83   Temp (!) 97.5 F (36.4 C) (Oral)   Resp 18   Ht 5' (1.524 m)   Wt 49.4 kg (109 lb)   SpO2 99%   BMI 21.29 kg/m   Physical Exam  Constitutional: She is oriented to person, place, and time. She appears well-developed and well-nourished. No distress.  HENT:  Head: Normocephalic and atraumatic.  Mouth/Throat: Oropharynx is clear and moist.  Eyes: Pupils are equal, round, and reactive to light.  Neck: Normal range of motion. Neck supple.  Cardiovascular: Normal rate, regular rhythm and normal heart sounds. Exam reveals no gallop and no friction rub.  No murmur heard. Pulmonary/Chest: Effort normal and breath sounds normal. No respiratory distress. She has no wheezes.  Abdominal: Soft. Bowel sounds are normal. She exhibits no distension. There is no tenderness.  Neurological: She is alert and oriented to person, place, and time. She exhibits normal muscle tone. Coordination normal.  Skin: Skin is warm and dry. Capillary refill takes less than 2 seconds. No rash noted. No erythema.  Psychiatric: She has a normal mood and affect. Her behavior is normal.  Nursing note and vitals reviewed.    ED Treatments / Results  Labs (all labs ordered are listed, but only abnormal results are displayed) Labs Reviewed  I-STAT CHEM 8, ED    EKG  EKG Interpretation  Date/Time:  Thursday July 04 2017  20:28:10 EST Ventricular Rate:  81 PR Interval:    QRS Duration: 86 QT Interval:  412 QTC Calculation: 478 R Axis:   88 Text Interpretation:  Sinus rhythm Left ventricular hypertrophy Abnormal ECG Confirmed by Ripley Fraise ([id number]) on 07/05/2017 12:34:02 AM       Radiology Dg Chest 2 View  Result Date: 07/04/2017 CLINICAL DATA:  Dyspnea.  Elevated D-dimer. EXAM: CHEST  2 VIEW COMPARISON:  06/26/2017 FINDINGS: Moderate hyperinflation, unchanged. There is generalized interstitial coarsening which is unchanged and probably chronic. No airspace consolidation. No effusion. Hilar, mediastinal and cardiac contours are unchanged. IMPRESSION: Hyperinflation. Chronic appearing interstitial coarsening. No airspace consolidation. No effusion. Electronically Signed   By: Andreas Newport M.D.   On: 07/04/2017 21:26  Procedures Procedures (including critical care time)  Medications Ordered in ED Medications - No data to display   Initial Impression / Assessment and Plan / ED Course  I have reviewed the triage vital signs and the nursing notes.  Pertinent labs & imaging results that were available during my care of the patient were reviewed by me and considered in my medical decision making (see chart for details).     I feel that the patient most likely does not have a PE but will perform a CTA of her chest.  Patient has been stable here in the emergency department.  Patient is due for an echo tomorrow.  Final Clinical Impressions(s) / ED Diagnoses   Final diagnoses:  None    ED Discharge Orders    None       Dalia Heading, PA-C 07/05/17 0105    Ripley Fraise, MD 07/05/17 [phone]

## 2017-07-10 NOTE — Progress Notes (Deleted)
Cardiology Office Note    Date:  07/10/2017   ID:  Melissa Mcdaniel, DOB Mar 30, 1933, MRN [id number]  PCP:  Melissa Stains, MD  Cardiologist: Dr. Angelena Mcdaniel  Chief Complaint: CHF follow up  History of Present Illness:   Melissa Mcdaniel is a 83 y.o. female with history of HTN, CAD, chronic systolic CHF, persistent atrial fibrillation, Crohn's disease, colon cancer presents for CHF.   She had recently moved from [facility] to [place] to be near her daughter. Her cardiac history includes atrial fibrillation. She has been on Eliquis. Records indicate that she had DCCV in December 2015 and was started on amiodarone at that time. Her most recent echo in November 2017 at [facility] showed LVEF=35-40% with global HK, moderate MR, mild to moderate TR. She had a cardiac cath in 2002 showing 30% ostial RCA stenosis. She has had colon cancer and has had a colon resection. No evidence of carotid disease by dopplers 2010. Last stress test 2012 did not show ischemia.  Last visit here in May 2018 and she reported weight loss, fatigue. She was having issues with her dentures and was not eating well.   Seen by [facility] 07/04/17 for dyspnea. BNP > 4000. Increased lasix. Negative CTA for PE. Echo showed LVEF of 50-55%, grade 2 DD, mild AI, mild MR and PA pressure of 35 mm Hg.   Here today for follow up.  BMP & BNP    Past Medical History:  Diagnosis Date  . A-fib ([place])   . Congestive heart failure (CHF) ([place]) 05/2016  . Crohn's disease ([place])   . Dysrhythmia    afib dx approx. 2016 per pt  . Hip fracture ([place])   . Hypertension     Past Surgical History:  Procedure Laterality Date  . FEMUR IM NAIL Right 07/26/2016   Procedure: INTRAMEDULLARY (IM) NAIL FEMORAL;  Surgeon: Renette Butters, MD;  Location: [facility];  Service: Orthopedics;  Laterality: Right;  . FRACTURE SURGERY    . large intestine - partial removal  approx 1993   due to crohns    Current Medications: Prior to  Admission medications   Medication Sig Start Date End Date Taking? Authorizing Provider  alendronate (FOSAMAX) 70 MG tablet Take 70 mg by mouth daily. 06/10/17   [provider]  amiodarone (PACERONE) 200 MG tablet Take 1 tablet (200 mg total) by mouth daily. 01/10/17   Burnell Blanks, MD  apixaban (ELIQUIS) 2.5 MG TABS tablet Take 1 tablet (2.5 mg total) by mouth 2 (two) times daily. 05/13/17   Burnell Blanks, MD  carvedilol (COREG) 6.25 MG tablet TAKES 6.25 mg TAB BY MOUTH TWICE DAILY 06/02/16   [provider]  clonazePAM (KLONOPIN) 0.5 MG tablet TK 1 T PO BID Patient taking differently: Take 0.5 mg by mouth 2 (two) times daily. TK 1 T PO BID 07/30/16   Theodis Blaze, MD  furosemide (LASIX) 20 MG tablet Take as directed 07/05/17   Burnell Blanks, MD  hydrALAZINE (APRESOLINE) 25 MG tablet Take 25 mg by mouth 2 (two) times daily.    [provider]  latanoprost (XALATAN) 0.005 % ophthalmic solution Place 1 drop into both eyes at bedtime.    [provider]  loperamide (IMODIUM) 2 MG capsule Take 4 mg by mouth 4 (four) times daily as needed for diarrhea or loose stools.    [provider]  mirtazapine (REMERON) 15 MG tablet Take 15 mg by mouth at bedtime.  [provider]  Multiple Vitamins-Iron (ONE-TABLET-DAILY/IRON PO) Take 1 tablet by mouth daily.    [provider]  ranitidine (ZANTAC) 150 MG tablet Take 150 mg by mouth daily as needed for heartburn.    [provider]  venlafaxine XR (EFFEXOR-XR) 150 MG 24 hr capsule Take 150 mg by mouth 2 (two) times daily.    [provider]    Allergies:   Penicillins   Social History   Socioeconomic History  . Marital status: Married    Spouse name: Not on file  . Number of children: 3  . Years of education: Not on file  . Highest education level: Not on file  Social Needs  . Financial resource strain: Not on file  . Food insecurity -  worry: Not on file  . Food insecurity - inability: Not on file  . Transportation needs - medical: Not on file  . Transportation needs - non-medical: Not on file  Occupational History  . Occupation: Worked at [employer]  . Smoking status: Former Smoker    Packs/day: 0.50    Years: 30.00    Pack years: 15.00    Types: Cigarettes    Last attempt to quit: 09/15/1975    Years since quitting: 41.8  . Smokeless tobacco: Never Used  Substance and Sexual Activity  . Alcohol use: No  . Drug use: No  . Sexual activity: Not on file  Other Topics Concern  . Not on file  Social History Narrative  . Not on file     Family History:  The patient's family history includes Alzheimer's disease in her father; Diabetes in her maternal grandmother; Lung cancer in her mother. ***  ROS:   Please see the history of present illness.    ROS All other systems reviewed and are negative.   PHYSICAL EXAM:   VS:  There were no vitals taken for this visit.   GEN: Well nourished, well developed, in no acute distress  HEENT: normal  Neck: no JVD, carotid bruits, or masses Cardiac: ***RRR; no murmurs, rubs, or gallops,no edema  Respiratory:  clear to auscultation bilaterally, normal work of breathing GI: soft, nontender, nondistended, + BS MS: no deformity or atrophy  Skin: warm and dry, no rash Neuro:  Alert and Oriented x 3, Strength and sensation are intact Psych: euthymic mood, full affect  Wt Readings from Last 3 Encounters:  07/04/17 109 lb (49.4 kg)  07/04/17 108 lb 6.4 oz (49.2 kg)  04/08/17 101 lb 12.8 oz (46.2 kg)      Studies/Labs Reviewed:   EKG:  EKG is ordered today.  The ekg ordered today demonstrates ***  Recent Labs: 07/04/2017: ALT 12; NT-Pro BNP 4,095; Platelets 382; TSH 5.560 07/05/2017: BUN 33; Creatinine, Ser 1.60; Hemoglobin 11.9; Potassium 4.7; Sodium 138   Lipid Panel No results found for: CHOL, TRIG, HDL, CHOLHDL, VLDL, LDLCALC,  LDLDIRECT  Additional studies/ records that were reviewed today include:   Echocardiogram: 07/05/17 Study Conclusions  - Left ventricle: The cavity size was normal. Wall thickness was   increased in a pattern of mild LVH. Systolic function was normal.   The estimated ejection fraction was in the range of 50% to 55%.   Features are consistent with a pseudonormal left ventricular   filling pattern, with concomitant abnormal relaxation and   increased filling pressure (grade 2 diastolic dysfunction). - Aortic valve: There was mild regurgitation. - Mitral valve: There was mild regurgitation. - Left atrium: The atrium was  severely dilated. - Pulmonary arteries: Systolic pressure was mildly increased. PA   peak pressure: 35 mm Hg (S).    ASSESSMENT & PLAN:    1. Chronic diastolic CHF  2. Persistent atrial fibrillation - Rate stable. Continue Amiodarone and coreg.   3.     Medication Adjustments/Labs and Tests Ordered: Current medicines are reviewed at length with the patient today.  Concerns regarding medicines are outlined above.  Medication changes, Labs and Tests ordered today are listed in the Patient Instructions below. There are no Patient Instructions on file for this visit.   Jarrett Soho, [place]  07/10/2017 4:50 PM    [facility] [address], [place], [place]  [address] Phone: [phone]; Fax: [phone]

## 2017-07-11 ENCOUNTER — Ambulatory Visit: Payer: Medicare Other | Admitting: Physician Assistant

## 2017-07-29 ENCOUNTER — Ambulatory Visit
Admission: RE | Admit: 2017-07-29 | Discharge: 2017-07-29 | Disposition: A | Discharge status: Home / Self Care | Payer: Medicare Other | Source: Ambulatory Visit | Attending: Nephrology | Admitting: Nephrology

## 2017-07-29 DIAGNOSIS — N183 Chronic kidney disease, stage 3 unspecified: Secondary | ICD-10-CM

## 2017-08-06 DIAGNOSIS — N183 Chronic kidney disease, stage 3 (moderate): Secondary | ICD-10-CM | POA: Diagnosis not present

## 2017-08-06 DIAGNOSIS — I129 Hypertensive chronic kidney disease with stage 1 through stage 4 chronic kidney disease, or unspecified chronic kidney disease: Secondary | ICD-10-CM | POA: Diagnosis not present

## 2017-08-06 DIAGNOSIS — N179 Acute kidney failure, unspecified: Secondary | ICD-10-CM | POA: Diagnosis not present

## 2017-08-06 DIAGNOSIS — N390 Urinary tract infection, site not specified: Secondary | ICD-10-CM | POA: Diagnosis not present

## 2017-08-07 ENCOUNTER — Encounter: Payer: Self-pay | Admitting: Physician Assistant

## 2017-08-07 ENCOUNTER — Ambulatory Visit (INDEPENDENT_AMBULATORY_CARE_PROVIDER_SITE_OTHER): Payer: Medicare Other | Admitting: Physician Assistant

## 2017-08-07 VITALS — BP 142/64 | HR 60 | Resp 16 | Ht 60.0 in | Wt 109.1 lb

## 2017-08-07 DIAGNOSIS — R0609 Other forms of dyspnea: Secondary | ICD-10-CM | POA: Diagnosis not present

## 2017-08-07 DIAGNOSIS — I509 Heart failure, unspecified: Secondary | ICD-10-CM | POA: Diagnosis not present

## 2017-08-07 DIAGNOSIS — Z01818 Encounter for other preprocedural examination: Secondary | ICD-10-CM | POA: Diagnosis not present

## 2017-08-07 DIAGNOSIS — I4891 Unspecified atrial fibrillation: Secondary | ICD-10-CM | POA: Diagnosis not present

## 2017-08-07 DIAGNOSIS — I5033 Acute on chronic diastolic (congestive) heart failure: Secondary | ICD-10-CM

## 2017-08-07 LAB — PRO B NATRIURETIC PEPTIDE: NT-PRO BNP: 2903 pg/mL — AB (ref 0–738)

## 2017-08-07 NOTE — Patient Instructions (Signed)
Medication Instructions:  Your physician has recommended you make the following change in your medication:  1. Take Lasix (20 mg ) for two days than take as needed for 2-3 lb weight gain in 24 hours.   Labwork: Your physician recommends that you have lab work today: BNP   Testing/Procedures: -None  Follow-Up: Keep Follow with Dr. Angelena Form.  Any Other Special Instructions Will Be Listed Below (If Applicable).     If you need a refill on your cardiac medications before your next appointment, please call your pharmacy.

## 2017-08-07 NOTE — Progress Notes (Signed)
Cardiology Office Note    Date:  08/07/2017   ID:  Melissa Mcdaniel, DOB 1932/08/28, MRN [id number]  PCP:  Melissa Stains, MD  Cardiologist: Dr. Angelena Form  Chief Complaint: CHF follow up  History of Present Illness:   Melissa Mcdaniel is a 84 y.o. female with history of HTN, CAD, chronic systolic CHF, persistent atrial fibrillation, Crohn's disease, colon cancer presents for CHF.   She had recently moved from [facility] to [place] to be near her daughter. Her cardiac history includes atrial fibrillation. She has been on Eliquis. Records indicate that she had DCCV in December 2015 and was started on amiodarone at that time. Her most recent echo in November 2017 at [facility] showed LVEF=35-40% with global HK, moderate MR, mild to moderate TR. She had a cardiac cath in 2002 showing 30% ostial RCA stenosis. She has had colon cancer and has had a colon resection. No evidence of carotid disease by dopplers 2010. Last stress test 2012 did not show ischemia. Last visit herein May 2018 and she reported weight loss, fatigue. She was having issues with her dentures and was not eating well.   Seen by [facility] 07/04/17 for dyspnea. BNP > 4000. Increased lasix. Negative CTA for PE. Echo showed LVEF of 50-55%, grade 2 DD, mild AI, mild MR and PA pressure of 35 mm Hg.   Here today for follow up. Per patient, normal kidney function about week ago by nephrologist. She is not taking any lasix currently. She did felt slight improvement on her dyspnea while on diuretics. She continues to have dyspnea with exertion. Stable. No associated chest pain, diaphoresis, nausea or vomiting. Her has intermittent palpitation, mostly at night-> no dyspnea at that time. Having gum implant of 2 tooth next week.   Past Medical History:  Diagnosis Date  . A-fib ([place])   . Congestive heart failure (CHF) ([place]) 05/2016  . Crohn's disease ([place])   . Dysrhythmia    afib dx approx. 2016 per pt  . Hip fracture  ([place])   . Hypertension     Past Surgical History:  Procedure Laterality Date  . FEMUR IM NAIL Right 07/26/2016   Procedure: INTRAMEDULLARY (IM) NAIL FEMORAL;  Surgeon: Renette Butters, MD;  Location: [place];  Service: Orthopedics;  Laterality: Right;  . FRACTURE SURGERY    . large intestine - partial removal  approx 1993   due to crohns    Current Medications:  Prior to Admission medications   Medication Sig Start Date End Date Taking? Authorizing Provider  alendronate (FOSAMAX) 70 MG tablet Take 70 mg by mouth once a week. Take with a full glass of water on an empty stomach.   Yes [provider]  amiodarone (PACERONE) 200 MG tablet Take 1 tablet (200 mg total) by mouth daily. 01/10/17  Yes Burnell Blanks, MD  apixaban (ELIQUIS) 2.5 MG TABS tablet Take 1 tablet (2.5 mg total) by mouth 2 (two) times daily. 05/13/17  Yes Burnell Blanks, MD  carvedilol (COREG) 6.25 MG tablet TAKES 6.25 mg TAB BY MOUTH TWICE DAILY 06/02/16  Yes [provider]  Cholecalciferol (VITAMIN D3) 2000 units TABS Take 1 tablet by mouth daily.   Yes [provider]  cholestyramine (QUESTRAN) 4 g packet Take 4 g by mouth 3 (three) times daily with meals.   Yes [provider]  clonazePAM (KLONOPIN) 0.5 MG tablet TK 1 T PO BID Patient taking differently: Take 0.5 mg by mouth 2 (two) times daily. TK 1  T PO BID 07/30/16  Yes Theodis Blaze, MD  Ferrous Sulfate (IRON) 28 MG TABS Take 1 tablet by mouth daily.   Yes [provider]  furosemide (LASIX) 20 MG tablet Take as directed 07/05/17  Yes Burnell Blanks, MD  hydrALAZINE (APRESOLINE) 25 MG tablet Take 25 mg by mouth 2 (two) times daily.   Yes [provider]  latanoprost (XALATAN) 0.005 % ophthalmic solution Place 1 drop into both eyes at bedtime.   Yes [provider]  loperamide (IMODIUM) 2 MG capsule Take 4 mg by mouth 4 (four) times daily as needed for diarrhea or loose stools.    Yes [provider]  mirtazapine (REMERON) 15 MG tablet Take 15 mg by mouth at bedtime.    Yes [provider]  Multiple Vitamins-Iron (ONE-TABLET-DAILY/IRON PO) Take 1 tablet by mouth daily.   Yes [provider]  ranitidine (ZANTAC) 150 MG tablet Take 150 mg by mouth daily as needed for heartburn.   Yes [provider]  venlafaxine XR (EFFEXOR-XR) 150 MG 24 hr capsule Take 150 mg by mouth 2 (two) times daily.   Yes [provider]    Allergies:   Penicillins   Social History   Socioeconomic History  . Marital status: Married    Spouse name: None  . Number of children: 3  . Years of education: None  . Highest education level: None  Social Needs  . Financial resource strain: None  . Food insecurity - worry: None  . Food insecurity - inability: None  . Transportation needs - medical: None  . Transportation needs - non-medical: None  Occupational History  . Occupation: Worked at [employer]  . Smoking status: Former Smoker    Packs/day: 0.50    Years: 30.00    Pack years: 15.00    Types: Cigarettes    Last attempt to quit: 09/15/1975    Years since quitting: 41.9  . Smokeless tobacco: Never Used  Substance and Sexual Activity  . Alcohol use: No  . Drug use: No  . Sexual activity: None  Other Topics Concern  . None  Social History Narrative  . None     Family History:  The patient's family history includes Alzheimer's disease in her father; Diabetes in her maternal grandmother; Lung cancer in her mother.   ROS:   Please see the history of present illness.    ROS All other systems reviewed and are negative.   PHYSICAL EXAM:   VS:  BP (!) 142/64   Pulse 60   Resp 16   Ht 5' (1.524 m)   Wt 109 lb 1.6 oz (49.5 kg)   SpO2 96%   BMI 21.31 kg/m    GEN: Well nourished, well developed, in no acute distress  HEENT: normal  Neck: no JVD, carotid bruits, or masses Cardiac: RRR; no murmurs, rubs, or gallops,  trace BL LE edema  Respiratory:  clear to auscultation bilaterally, normal work of breathing GI: soft, nontender, nondistended, + BS MS: no deformity or atrophy  Skin: warm and dry, no rash Neuro:  Alert and Oriented x 3, Strength and sensation are intact Psych: euthymic mood, full affect  Wt Readings from Last 3 Encounters:  08/07/17 109 lb 1.6 oz (49.5 kg)  07/04/17 109 lb (49.4 kg)  07/04/17 108 lb 6.4 oz (49.2 kg)      Studies/Labs Reviewed:   EKG:  EKG is ordered today.  The ekg ordered today demonstrates SR with  PACs  Recent Labs: 07/04/2017: ALT 12; NT-Pro BNP 4,095; Platelets 382; TSH 5.560 07/05/2017: BUN 33; Creatinine, Ser 1.60; Hemoglobin 11.9; Potassium 4.7; Sodium 138   Lipid Panel No results found for: CHOL, TRIG, HDL, CHOLHDL, VLDL, LDLCALC, LDLDIRECT  Additional studies/ records that were reviewed today include:   Echocardiogram: 07/05/17 Study Conclusions  - Left ventricle: The cavity size was normal. Wall thickness was increased in a pattern of mild LVH. Systolic function was normal. The estimated ejection fraction was in the range of 50% to 55%. Features are consistent with a pseudonormal left ventricular filling pattern, with concomitant abnormal relaxation and increased filling pressure (grade 2 diastolic dysfunction). - Aortic valve: There was mild regurgitation. - Mitral valve: There was mild regurgitation. - Left atrium: The atrium was severely dilated. - Pulmonary arteries: Systolic pressure was mildly increased. PA peak pressure: 35 mm Hg (S).   ASSESSMENT & PLAN:   1. Dyspnea on exertion - No significant improvement while on lasix for new day. She continues to have DOE without chest pain. EKG is reassuring today as well. Her weight is up 1 lb compared to last office visit. Weight has been stable at home. Check BNP. Start Lasix 75m qd x 2 days and then PRN. Some of symptoms could be from deconditioning.  - if no improvement  may consider ischemic eval vs r/o arrhthymias.   2. Acute on chronic diastolic CHF - mild LE edema. As above  3. Paroxysmal atrial fibrillation - Sinus rhythm. Continue Amiodarone and coreg. Intermittent palpitations. Continue Elquis for anticoagulation.   4. Pre op clearance - Ok to hold Eliquis for 2 days. Reviewed with pharmacy. She will be at acceptable risk given past medical history.      Medication Adjustments/Labs and Tests Ordered: Current medicines are reviewed at length with the patient today.  Concerns regarding medicines are outlined above.  Medication changes, Labs and Tests ordered today are listed in the Patient Instructions below. Patient Instructions  Medication Instructions:  Your physician has recommended you make the following change in your medication:  1. Take Lasix (20 mg ) for two days than take as needed for 2-3 lb weight gain in 24 hours.   Labwork: Your physician recommends that you have lab work today: BNP   Testing/Procedures: -None  Follow-Up: Keep Follow with Dr. MAngelena Form  Any Other Special Instructions Will Be Listed Below (If Applicable).     If you need a refill on your cardiac medications before your next appointment, please call your pharmacy.      SJarrett Soho PU[place] 08/07/2017 11:06 AM    [place]  [id number]: [phone] Fax: [phone]

## 2017-08-08 ENCOUNTER — Telehealth: Payer: Self-pay | Admitting: *Deleted

## 2017-08-08 DIAGNOSIS — Z79899 Other long term (current) drug therapy: Secondary | ICD-10-CM

## 2017-08-08 NOTE — Telephone Encounter (Signed)
-----   Message from [place], [place] sent at 08/07/2017  4:26 PM EST ----- Her fluid marker level is still significantly elevated. Please continued to take lasix for 1week then repeat BNP and BMP.

## 2017-08-09 ENCOUNTER — Other Ambulatory Visit: Payer: Self-pay | Admitting: Cardiovascular Disease

## 2017-08-09 ENCOUNTER — Other Ambulatory Visit: Payer: Self-pay | Admitting: Physician Assistant

## 2017-08-09 MED ORDER — FUROSEMIDE 20 MG PO TABS: 20.0000 mg | ORAL_TABLET | Freq: Every day | ORAL | 3 refills | Status: DC

## 2017-08-09 MED ORDER — FUROSEMIDE 20 MG PO TABS: 20.0000 mg | ORAL_TABLET | Freq: Every day | ORAL | 0 refills | Status: DC

## 2017-08-09 NOTE — Addendum Note (Signed)
Addended by: Derl Barrow on: 08/09/2017 01:50 PM   Modules accepted: Orders

## 2017-08-15 ENCOUNTER — Other Ambulatory Visit: Payer: Medicare Other

## 2017-08-20 ENCOUNTER — Other Ambulatory Visit: Payer: Medicare Other | Admitting: *Deleted

## 2017-08-20 DIAGNOSIS — Z79899 Other long term (current) drug therapy: Secondary | ICD-10-CM | POA: Diagnosis not present

## 2017-08-20 DIAGNOSIS — I5033 Acute on chronic diastolic (congestive) heart failure: Secondary | ICD-10-CM | POA: Diagnosis not present

## 2017-08-20 LAB — BASIC METABOLIC PANEL
BUN/Creatinine Ratio: 11 — ABNORMAL LOW (ref 12–28)
BUN: 16 mg/dL (ref 8–27)
CO2: 18 mmol/L — AB (ref 20–29)
CREATININE: 1.52 mg/dL — AB (ref 0.57–1.00)
Calcium: 8.3 mg/dL — ABNORMAL LOW (ref 8.7–10.3)
Chloride: 104 mmol/L (ref 96–106)
GFR calc Af Amer: 37 mL/min/{1.73_m2} — ABNORMAL LOW (ref >59)
GFR, EST NON AFRICAN AMERICAN: 32 mL/min/{1.73_m2} — AB (ref >59)
GLUCOSE: 106 mg/dL — AB (ref 65–99)
Potassium: 3.9 mmol/L (ref 3.5–5.2)
Sodium: 140 mmol/L (ref 134–144)

## 2017-08-20 LAB — PRO B NATRIURETIC PEPTIDE: NT-PRO BNP: 3103 pg/mL — AB (ref 0–738)

## 2017-08-22 ENCOUNTER — Telehealth: Payer: Self-pay | Admitting: Cardiovascular Disease

## 2017-08-22 MED ORDER — FUROSEMIDE 20 MG PO TABS: 20.0000 mg | ORAL_TABLET | Freq: Two times a day (BID) | ORAL | 2 refills | Status: DC

## 2017-08-22 MED ORDER — POTASSIUM CHLORIDE ER 10 MEQ PO TBCR: 10.0000 meq | EXTENDED_RELEASE_TABLET | Freq: Every day | ORAL | 2 refills | Status: DC

## 2017-08-22 NOTE — Telephone Encounter (Signed)
New message    Pt c/o medication issue:  1. Name of Medication: furosemide (LASIX) 20 MG tablet  2. How are you currently taking this medication (dosage and times per day)? As prescribed  3. Are you having a reaction (difficulty breathing--STAT)? No  4. What is your medication issue? Patient wants to know if she needs to continue taking Lasix

## 2017-08-22 NOTE — Telephone Encounter (Signed)
Notes recorded by Leanor Kail, PA on 08/22/2017 at 10:05 AM EST Increase lasix to 79m BID with addition of Kdur 10 meq daily. Have follow up with APP next week. I spoke with pt and gave her instructions from B. Bhagat, PA.  [place] made for pt to see B. SRosita Fire [place] 08/29/17 at 11:30.

## 2017-08-27 ENCOUNTER — Ambulatory Visit: Payer: Medicare Other | Admitting: Physician Assistant

## 2017-08-27 DIAGNOSIS — Z961 Presence of intraocular lens: Secondary | ICD-10-CM | POA: Diagnosis not present

## 2017-08-27 DIAGNOSIS — H401131 Primary open-angle glaucoma, bilateral, mild stage: Secondary | ICD-10-CM | POA: Diagnosis not present

## 2017-08-29 ENCOUNTER — Ambulatory Visit (INDEPENDENT_AMBULATORY_CARE_PROVIDER_SITE_OTHER): Payer: Medicare Other | Admitting: Cardiology

## 2017-08-29 ENCOUNTER — Encounter: Payer: Self-pay | Admitting: Cardiology

## 2017-08-29 VITALS — BP 122/54 | HR 72 | Ht 60.0 in | Wt 106.8 lb

## 2017-08-29 DIAGNOSIS — I5032 Chronic diastolic (congestive) heart failure: Secondary | ICD-10-CM

## 2017-08-29 DIAGNOSIS — I38 Endocarditis, valve unspecified: Secondary | ICD-10-CM

## 2017-08-29 DIAGNOSIS — I509 Heart failure, unspecified: Secondary | ICD-10-CM | POA: Diagnosis not present

## 2017-08-29 NOTE — Progress Notes (Signed)
08/29/2017 Neil Errickson   1932/12/11  [id number]  Primary Physician Harlan Stains, MD Primary Cardiologist: Dr. Angelena Form   Reason for Visit/CC: f/u for Acute on chronic diastolic CHF  HPI:  Melissa Mcdaniel is a 84 y.o. female with a h/o HTN, mild nonobstructive CAD, chronic systolic CHF, persistent atrial fibrillation on Eliquis, Crohn's disease and h/o colon cancer treated with resection, who presents to clinic for f/u given recent acute on chronic diastolic CHF, requiring lasix dose adjustments.   She recently presented 08/07/17 with complaints of exertional dyspnea with minimal activity. She struggled with basic ADLs around her home and had to stop to rest. No CP. Office EKG did not show any ischemic abnormalities. HR controlled. BNP was obtained and was elevated at 2,900. She was instructed to restart Lasix, 20 mg daily (previously only taking PRN). She had f/u BMP 1 week later that showed stable SCr and K but BNP had further increased to >3,000. She was given instruction to further increase Lasix to 20 mg BID and supplemental K was also added.   She presents back to clinic today for f/u. She is here with her husband. She and he both note significant improvement. Pt is no longer short of breath with ADLs. She feels that she is back to her baseline. She denies any other cardiac symptoms. No edema on exam. Lungs are CTAB and BP is stable.     Current Meds  Medication Sig  . alendronate (FOSAMAX) 70 MG tablet Take 70 mg by mouth once a week. Take with a full glass of water on an empty stomach.  Marland Kitchen amiodarone (PACERONE) 200 MG tablet Take 1 tablet (200 mg total) by mouth daily.  Marland Kitchen apixaban (ELIQUIS) 2.5 MG TABS tablet Take 1 tablet (2.5 mg total) by mouth 2 (two) times daily.  . carvedilol (COREG) 6.25 MG tablet TAKES 6.25 mg TAB BY MOUTH TWICE DAILY  . Cholecalciferol (VITAMIN D3) 2000 units TABS Take 1 tablet by mouth daily.  . cholestyramine (QUESTRAN) 4 g packet Take 4 g by  mouth 3 (three) times daily with meals.  . clonazePAM (KLONOPIN) 0.5 MG tablet TK 1 T PO BID (Patient taking differently: Take 0.5 mg by mouth 2 (two) times daily. TK 1 T PO BID)  . Ferrous Sulfate (IRON) 28 MG TABS Take 1 tablet by mouth daily.  . furosemide (LASIX) 20 MG tablet Take 1 tablet (20 mg total) by mouth 2 (two) times daily.  . hydrALAZINE (APRESOLINE) 25 MG tablet Take 25 mg by mouth 2 (two) times daily.  Marland Kitchen latanoprost (XALATAN) 0.005 % ophthalmic solution Place 1 drop into both eyes at bedtime.  Marland Kitchen loperamide (IMODIUM) 2 MG capsule Take 4 mg by mouth 4 (four) times daily as needed for diarrhea or loose stools.  . mirtazapine (REMERON) 15 MG tablet Take 15 mg by mouth at bedtime.   . Multiple Vitamins-Iron (ONE-TABLET-DAILY/IRON PO) Take 1 tablet by mouth daily.  . potassium chloride (K-DUR) 10 MEQ tablet Take 1 tablet (10 mEq total) by mouth daily.  . ranitidine (ZANTAC) 150 MG tablet Take 150 mg by mouth daily as needed for heartburn.  . venlafaxine XR (EFFEXOR-XR) 150 MG 24 hr capsule Take 150 mg by mouth 2 (two) times daily.   Allergies  Allergen Reactions  . Penicillins Swelling    Swelling of mouth and tongue   Past Medical History:  Diagnosis Date  . A-fib ([place])   . Congestive heart failure (CHF) ([place]) 05/2016  . Crohn's disease ([place])   .  Dysrhythmia    afib dx approx. 2016 per pt  . Hip fracture ([place])   . Hypertension    Family History  Problem Relation Age of Onset  . Lung cancer Mother   . Alzheimer's disease Father   . Diabetes Maternal Grandmother    Past Surgical History:  Procedure Laterality Date  . FEMUR IM NAIL Right 07/26/2016   Procedure: INTRAMEDULLARY (IM) NAIL FEMORAL;  Surgeon: Renette Butters, MD;  Location: [facility];  Service: Orthopedics;  Laterality: Right;  . FRACTURE SURGERY    . large intestine - partial removal  approx 1993   due to crohns   Social History   Socioeconomic History  . Marital status: Married    Spouse name: Not on  file  . Number of children: 3  . Years of education: Not on file  . Highest education level: Not on file  Social Needs  . Financial resource strain: Not on file  . Food insecurity - worry: Not on file  . Food insecurity - inability: Not on file  . Transportation needs - medical: Not on file  . Transportation needs - non-medical: Not on file  Occupational History  . Occupation: Worked at [employer]  . Smoking status: Former Smoker    Packs/day: 0.50    Years: 30.00    Pack years: 15.00    Types: Cigarettes    Last attempt to quit: 09/15/1975    Years since quitting: 41.9  . Smokeless tobacco: Never Used  Substance and Sexual Activity  . Alcohol use: No  . Drug use: No  . Sexual activity: Not on file  Other Topics Concern  . Not on file  Social History Narrative  . Not on file     Review of Systems: General: negative for chills, fever, night sweats or weight changes.  Cardiovascular: negative for chest pain, dyspnea on exertion, edema, orthopnea, palpitations, paroxysmal nocturnal dyspnea or shortness of breath Dermatological: negative for rash Respiratory: negative for cough or wheezing Urologic: negative for hematuria Abdominal: negative for nausea, vomiting, diarrhea, bright red blood per rectum, melena, or hematemesis Neurologic: negative for visual changes, syncope, or dizziness All other systems reviewed and are otherwise negative except as noted above.   Physical Exam:  Blood pressure (!) 122/54, pulse 72, height 5' (1.524 m), weight 106 lb 12.8 oz (48.4 kg).  General appearance: alert, cooperative and no distress Neck: no carotid bruit and no JVD Lungs: clear to auscultation bilaterally Heart: regular rate and rhythm, S1, S2 normal, no murmur, click, rub or gallop Extremities: extremities normal, atraumatic, no cyanosis or edema Pulses: 2+ and symmetric Skin: Skin color, texture, turgor normal. No rashes or lesions Neurologic: Grossly  normal  EKG no performed -- personally reviewed   ASSESSMENT AND PLAN:   1. Chronic Diastolic CHF: Significant improvement with resolution of symptoms after increase in Lasix to 20 mg daily.  She denies any recurrent exertional dyspnea.  She is able to perform all of her ADLs without any limitations.  She appears euvolemic on physical exam today.  No lower extremity edema and lung exam is clear to auscultation bilaterally.  Blood pressure stable.  We will recheck a basic metabolic panel today to reassess renal function and potassium.  If stable we will elect to continue her on her current dose of 20 mg twice daily.  However if there is any evidence of worsening renal insufficiency then we may decrease the dose of her Lasix and instruct her to increase  back to twice daily only if significant weight gain.  Patient was given instructions to check weights daily and to call our office if she gains more than 3 pounds in a 24-hour period or more than 5 pounds in a week.  We also discussed the importance of maintaining a low-sodium diet to reduce risk of recurrent fluid retention.  We will continue beta-blocker for diastolic dysfunction.   2.  CAD: She had a cardiac cath in 2002 showing 30% ostial RCA stenosis. Last stress test 2012 did not show ischemia.  She denies chest pain.  Continue medical therapy.  3.  Persistent atrial fibrillation: Rate is controlled with carvedilol. patient is on Eliquis for stroke prophylaxis.  She is on low-dose 2.5 mg twice daily given age greater than 60 and serum creatinine 1.5.    4.  Hypertension: Well-controlled on current regimen.   Follow-Up: Keep yearly follow-up with Dr. Angelena Form in March  Melissa Mcdaniel, M[facility] 08/29/2017 11:41 AM

## 2017-08-29 NOTE — Patient Instructions (Addendum)
Medication Instructions:  Your physician recommends that you continue on your current medications as directed. Please refer to the Current Medication list given to you today.  Labwork: Today for basic metabolic panel   Testing/Procedures: None ordered   Follow-Up: Keep your follow up appointment with Dr. Angelena Form on 09/24/17  Any Other Special Instructions Will Be Listed Below (If Applicable).  Heart Failure Heart failure means your heart has trouble pumping blood. This makes it hard for your body to work well. Heart failure is usually a long-term (chronic) condition. You must take good care of yourself and follow your doctor's treatment plan. Follow these instructions at home:  Take your heart medicine as told by your doctor. ? Do not stop taking medicine unless your doctor tells you to. ? Do not skip any dose of medicine. ? Refill your medicines before they run out. ? Take other medicines only as told by your doctor or pharmacist.  Stay active if told by your doctor. The elderly and people with severe heart failure should talk with a doctor about physical activity.  Eat heart-healthy foods. Choose foods that are without trans fat and are low in saturated fat, cholesterol, and salt (sodium). This includes fresh or frozen fruits and vegetables, fish, lean meats, fat-free or low-fat dairy foods, whole grains, and high-fiber foods. Lentils and dried peas and beans (legumes) are also good choices.  Limit salt if told by your doctor.  Cook in a healthy way. Roast, grill, broil, bake, poach, steam, or stir-fry foods.  Limit fluids as told by your doctor.  Weigh yourself every morning. Do this after you pee (urinate) and before you eat breakfast. Write down your weight to give to your doctor.  Take your blood pressure and write it down if your doctor tells you to.  Ask your doctor how to check your pulse. Check your pulse as told.  Lose weight if told by your doctor.  Stop smoking  or chewing tobacco. Do not use gum or patches that help you quit without your doctor's approval.  Schedule and go to doctor visits as told.  Nonpregnant women should have no more than 1 drink a day. Men should have no more than 2 drinks a day. Talk to your doctor about drinking alcohol.  Stop illegal drug use.  Stay current with shots (immunizations).  Manage your health conditions as told by your doctor.  Learn to manage your stress.  Rest when you are tired.  If it is really hot outside: ? Avoid intense activities. ? Use air conditioning or fans, or get in a cooler place. ? Avoid caffeine and alcohol. ? Wear loose-fitting, lightweight, and light-colored clothing.  If it is really cold outside: ? Avoid intense activities. ? Layer your clothing. ? Wear mittens or gloves, a hat, and a scarf when going outside. ? Avoid alcohol.  Learn about heart failure and get support as needed.  Get help to maintain or improve your quality of life and your ability to care for yourself as needed. Contact a doctor if:  You gain weight quickly.  You are more short of breath than usual.  You cannot do your normal activities.  You tire easily.  You cough more than normal, especially with activity.  You have any or more puffiness (swelling) in areas such as your hands, feet, ankles, or belly (abdomen).  You cannot sleep because it is hard to breathe.  You feel like your heart is beating fast (palpitations).  You get dizzy or  light-headed when you stand up. Get help right away if:  You have trouble breathing.  There is a change in mental status, such as becoming less alert or not being able to focus.  You have chest pain or discomfort.  You faint. This information is not intended to replace advice given to you by your health care provider. Make sure you discuss any questions you have with your health care provider. Document Released: 04/17/2008 Document Revised: 12/15/2015  Document Reviewed: 08/25/2012 Elsevier Interactive Patient Education  2017 [place].   Heart Failure Action Plan A heart failure action plan helps you understand what to do when you have symptoms of heart failure. Follow the plan that was created by you and your health care provider. Review your plan each time you visit your health care provider. Red zone These signs and symptoms mean you should get medical help right away:  You have trouble breathing when resting.  You have a dry cough that is getting worse.  You have swelling or pain in your legs or abdomen that is getting worse.  You suddenly gain more than 2-3 lb (0.9-1.4 kg) in a day, or more than 5 lb (2.3 kg) in one week. This amount may be more or less depending on your condition.  You have trouble staying awake or you feel confused.  You have chest pain.  You do not have an appetite.  You pass out.  If you experience any of these symptoms:  Call your local emergency services (911 in the [place] right away or seek help at the emergency department of the nearest hospital.  Yellow zone These signs and symptoms mean your condition may be getting worse and you should make some changes:  You have trouble breathing when you are active or you need to sleep with extra pillows.  You have swelling in your legs or abdomen.  You gain 2-3 lb (0.9-1.4 kg) in one day, or 5 lb (2.3 kg) in one week. This amount may be more or less depending on your condition.  You get tired easily.  You have trouble sleeping.  You have a dry cough.  If you experience any of these symptoms:  Contact your health care provider within the next day.  Your health care provider may adjust your medicines.  Green zone These signs mean you are doing well and can continue what you are doing:  You do not have shortness of breath.  You have very little swelling or no new swelling.  Your weight is stable (no gain or loss).  You have a normal  activity level.  You do not have chest pain or any other new symptoms.  Follow these instructions at home:  Take over-the-counter and prescription medicines only as told by your health care provider.  Weigh yourself daily. Your target weight is __________ lb (__________ kg). ? Call your health care provider if you gain more than __________ lb (__________ kg) in a day, or more than __________ lb (__________ kg) in one week.  Eat a heart-healthy diet. Work with a diet and nutrition specialist (dietitian) to create an eating plan that is best for you.  Keep all follow-up visits as told by your health care provider. This is important. Where to find more information:  American Heart Association: www.heart.org Summary  Follow the action plan that was created by you and your health care provider.  Get help right away if you have any symptoms in the Red zone. This information is not  intended to replace advice given to you by your health care provider. Make sure you discuss any questions you have with your health care provider. Document Released: 08/18/2016 Document Revised: 08/18/2016 Document Reviewed: 08/18/2016 Elsevier Interactive Patient Education  [employer].      If you need a refill on your cardiac medications before your next appointment, please call your pharmacy.

## 2017-08-30 ENCOUNTER — Telehealth: Payer: Self-pay | Admitting: *Deleted

## 2017-08-30 DIAGNOSIS — Z79899 Other long term (current) drug therapy: Secondary | ICD-10-CM

## 2017-08-30 LAB — BASIC METABOLIC PANEL
BUN / CREAT RATIO: 15 (ref 12–28)
BUN: 37 mg/dL — AB (ref 8–27)
CALCIUM: 8.6 mg/dL — AB (ref 8.7–10.3)
CHLORIDE: 104 mmol/L (ref 96–106)
CO2: 22 mmol/L (ref 20–29)
CREATININE: 2.44 mg/dL — AB (ref 0.57–1.00)
GFR calc non Af Amer: 18 mL/min/{1.73_m2} — ABNORMAL LOW (ref >59)
GFR, EST AFRICAN AMERICAN: 21 mL/min/{1.73_m2} — AB (ref >59)
Glucose: 86 mg/dL (ref 65–99)
Potassium: 4.9 mmol/L (ref 3.5–5.2)
Sodium: 140 mmol/L (ref 134–144)

## 2017-08-30 MED ORDER — FUROSEMIDE 20 MG PO TABS: ORAL_TABLET | ORAL | 2 refills | Status: DC

## 2017-08-30 MED ORDER — POTASSIUM CHLORIDE ER 10 MEQ PO TBCR: EXTENDED_RELEASE_TABLET | ORAL | 2 refills | Status: DC

## 2017-08-30 NOTE — Telephone Encounter (Signed)
-----   Message from [place], [place] sent at 08/30/2017 11:54 AM EST ----- Kidney numbers are abnormal. We need to back down on Lasix as this is likely stressing her kidneys. Since she is back to normal w/o any further shortness of breath, we should change back to 20 mg PRN only. However pt will need to closely monitor her weight daily. If she has any progressive weight gain, >3 lb in 24 hrs or > 5 lb in 1 week, take 20 mg lasix tablet and notify our office. Avoiding salt will help to keep her from retaining fluid. Since we are stopping daily lasix, would also stop daily potassium and only take 1 potassium pill on days that she needs to take lasix. We will need a repeat BMP to recheck kidneys and potassium in 5-7 days. Encourage her to keep her f/u with Dr. Angelena Form next month. Call us if she is having any problems.

## 2017-08-30 NOTE — Addendum Note (Signed)
Addended by: Gaetano Net on: 08/30/2017 03:03 PM   Modules accepted: Orders

## 2017-09-06 ENCOUNTER — Other Ambulatory Visit: Payer: Medicare Other | Admitting: *Deleted

## 2017-09-06 DIAGNOSIS — Z79899 Other long term (current) drug therapy: Secondary | ICD-10-CM

## 2017-09-06 LAB — BASIC METABOLIC PANEL
BUN / CREAT RATIO: 17 (ref 12–28)
BUN: 28 mg/dL — AB (ref 8–27)
CHLORIDE: 102 mmol/L (ref 96–106)
CO2: 19 mmol/L — ABNORMAL LOW (ref 20–29)
Calcium: 8.4 mg/dL — ABNORMAL LOW (ref 8.7–10.3)
Creatinine, Ser: 1.68 mg/dL — ABNORMAL HIGH (ref 0.57–1.00)
GFR calc Af Amer: 32 mL/min/{1.73_m2} — ABNORMAL LOW (ref >59)
GFR calc non Af Amer: 28 mL/min/{1.73_m2} — ABNORMAL LOW (ref >59)
GLUCOSE: 102 mg/dL — AB (ref 65–99)
Potassium: 4.6 mmol/L (ref 3.5–5.2)
SODIUM: 137 mmol/L (ref 134–144)

## 2017-09-09 ENCOUNTER — Telehealth: Payer: Self-pay | Admitting: Cardiovascular Disease

## 2017-09-09 NOTE — Progress Notes (Signed)
Pt has been made aware of normal result and verbalized understanding.  jw

## 2017-09-09 NOTE — Telephone Encounter (Signed)
Patient returning call for results 

## 2017-09-09 NOTE — Telephone Encounter (Signed)
Returned pts call and she has been made aware of her lab results. See result note.

## 2017-09-09 NOTE — Telephone Encounter (Signed)
-----   Message from Consuelo Pandy, [place] sent at 09/08/2017 12:52 PM EST ----- Kidney function is better. I recommend that we continue to only use lasix PRN based on daily weights. Only take potassium supplement on days that she needs to take Lasix. Call us if any changes in weight, > 3 lb weight gain in 24 hrs or > 5 lb in 1 week.

## 2017-10-01 DIAGNOSIS — I480 Paroxysmal atrial fibrillation: Secondary | ICD-10-CM | POA: Diagnosis not present

## 2017-10-01 DIAGNOSIS — N183 Chronic kidney disease, stage 3 (moderate): Secondary | ICD-10-CM | POA: Diagnosis not present

## 2017-10-01 DIAGNOSIS — R946 Abnormal results of thyroid function studies: Secondary | ICD-10-CM | POA: Diagnosis not present

## 2017-10-01 DIAGNOSIS — I129 Hypertensive chronic kidney disease with stage 1 through stage 4 chronic kidney disease, or unspecified chronic kidney disease: Secondary | ICD-10-CM | POA: Diagnosis not present

## 2017-10-01 DIAGNOSIS — F339 Major depressive disorder, recurrent, unspecified: Secondary | ICD-10-CM | POA: Diagnosis not present

## 2017-10-01 DIAGNOSIS — E440 Moderate protein-calorie malnutrition: Secondary | ICD-10-CM | POA: Diagnosis not present

## 2017-10-01 DIAGNOSIS — M810 Age-related osteoporosis without current pathological fracture: Secondary | ICD-10-CM | POA: Diagnosis not present

## 2017-10-03 DIAGNOSIS — D649 Anemia, unspecified: Secondary | ICD-10-CM | POA: Diagnosis not present

## 2017-10-03 DIAGNOSIS — R946 Abnormal results of thyroid function studies: Secondary | ICD-10-CM | POA: Diagnosis not present

## 2017-10-04 ENCOUNTER — Encounter: Payer: Self-pay | Admitting: Cardiovascular Disease

## 2017-10-14 ENCOUNTER — Encounter: Payer: Self-pay | Admitting: Cardiovascular Disease

## 2017-10-14 ENCOUNTER — Ambulatory Visit (INDEPENDENT_AMBULATORY_CARE_PROVIDER_SITE_OTHER): Payer: Medicare Other | Admitting: Cardiovascular Disease

## 2017-10-14 VITALS — BP 128/60 | HR 72 | Ht 60.0 in | Wt 105.0 lb

## 2017-10-14 DIAGNOSIS — I2510 Atherosclerotic heart disease of native coronary artery without angina pectoris: Secondary | ICD-10-CM

## 2017-10-14 DIAGNOSIS — I10 Essential (primary) hypertension: Secondary | ICD-10-CM | POA: Diagnosis not present

## 2017-10-14 DIAGNOSIS — I428 Other cardiomyopathies: Secondary | ICD-10-CM

## 2017-10-14 DIAGNOSIS — I5032 Chronic diastolic (congestive) heart failure: Secondary | ICD-10-CM

## 2017-10-14 DIAGNOSIS — I480 Paroxysmal atrial fibrillation: Secondary | ICD-10-CM | POA: Diagnosis not present

## 2017-10-14 MED ORDER — CARVEDILOL 3.125 MG PO TABS: 3.1250 mg | ORAL_TABLET | Freq: Two times a day (BID) | ORAL | 6 refills | Status: DC

## 2017-10-14 NOTE — Addendum Note (Signed)
Addended by: Thompson Grayer on: 10/14/2017 12:11 PM   Modules accepted: Orders

## 2017-10-14 NOTE — Patient Instructions (Signed)
Medication Instructions:  Your physician has recommended you make the following change in your medication:  Decrease carvedilol to 3.215 mg by mouth twice daily.    Labwork: none  Testing/Procedures: none  Follow-Up: Your physician recommends that you schedule a follow-up appointment in: 04/16/18 at 10:00    Any Other Special Instructions Will Be Listed Below (If Applicable).     If you need a refill on your cardiac medications before your next appointment, please call your pharmacy.

## 2017-10-14 NOTE — Progress Notes (Signed)
Chief Complaint  Patient presents with  . Follow-up    CHF     History of Present Illness: 84 yo female with history of HTN, CAD, chronic systolic CHF, persistent atrial fibrillation, Crohn's disease, colon cancer who is here today for cardiac follow up. I saw her as a new patient in February 2018. She had recently moved from [facility] to [place] to be near her daughter. She is known to have atrial fibrillation. She has been on Eliquis. Records indicate that she had DCCV in December 2015 and was started on amiodarone at that time. Her most recent echo in November 2017 at [facility] showed LVEF=35-40% with global HK, moderate MR, mild to moderate TR. She had a cardiac cath in 2002 showing 30% ostial RCA stenosis. She has had colon cancer and has had a colon resection. No evidence of carotid disease by dopplers 2010. Last stress test 2012 did not show ischemia. I saw her in December 2018 with c/o dyspnea but no cough, fever, sick contacts, LE edema. Chest x-ray was clear. Echo 07/05/17 with LVEF=50-55%. Mild AI and mild MR.  D-dimer was slightly elevated. Chest CTA without evidence of PE. Her BNP was elevated so Lasix increased. Her dyspnea resolved but renal function worsened with higher doses of Lasix. She has been using Lasix as needed since then.   She is here today for follow up. The patient denies any chest pain, dyspnea, palpitations, lower extremity edema, orthopnea, PND, dizziness, near syncope or syncope. She has daytime fatigue.     Primary Care Physician: Harlan Stains, MD  Past Medical History:  Diagnosis Date  . A-fib ([place])   . Congestive heart failure (CHF) ([place]) 05/2016  . Crohn's disease ([place])   . Dysrhythmia    afib dx approx. 2016 per pt  . Hip fracture ([place])   . Hypertension     Past Surgical History:  Procedure Laterality Date  . FEMUR IM NAIL Right 07/26/2016   Procedure: INTRAMEDULLARY (IM) NAIL FEMORAL;  Surgeon: Renette Butters, MD;  Location: [place];   Service: Orthopedics;  Laterality: Right;  . FRACTURE SURGERY    . large intestine - partial removal  approx 1993   due to crohns    Current Outpatient Medications  Medication Sig Dispense Refill  . alendronate (FOSAMAX) 70 MG tablet Take 70 mg by mouth once a week. Take with a full glass of water on an empty stomach.    Marland Kitchen amiodarone (PACERONE) 200 MG tablet Take 1 tablet (200 mg total) by mouth daily. 90 tablet 3  . apixaban (ELIQUIS) 2.5 MG TABS tablet Take 1 tablet (2.5 mg total) by mouth 2 (two) times daily. 180 tablet 1  . Cholecalciferol (VITAMIN D3) 2000 units TABS Take 1 tablet by mouth daily.    . cholestyramine (QUESTRAN) 4 g packet Take 4 g by mouth 3 (three) times daily with meals.    . clonazePAM (KLONOPIN) 0.5 MG tablet TK 1 T PO BID (Patient taking differently: Take 0.5 mg by mouth 2 (two) times daily. ) 30 tablet 0  . Ferrous Sulfate (IRON) 28 MG TABS Take 1 tablet by mouth daily.    . furosemide (LASIX) 20 MG tablet Take 1 tablet daily only as needed for weight gain 180 tablet 2  . hydrALAZINE (APRESOLINE) 25 MG tablet Take 25 mg by mouth 2 (two) times daily.    Marland Kitchen latanoprost (XALATAN) 0.005 % ophthalmic solution Place 1 drop into both eyes at bedtime.    Marland Kitchen  loperamide (IMODIUM) 2 MG capsule Take 4 mg by mouth 4 (four) times daily as needed for diarrhea or loose stools.    . mirtazapine (REMERON) 15 MG tablet Take 15 mg by mouth at bedtime.     . Multiple Vitamins-Iron (ONE-TABLET-DAILY/IRON PO) Take 1 tablet by mouth daily.    . potassium chloride (K-DUR) 10 MEQ tablet Take 1 tablet only when you take a Lasix. 90 tablet 2  . ranitidine (ZANTAC) 150 MG tablet Take 150 mg by mouth daily as needed for heartburn.    . venlafaxine XR (EFFEXOR-XR) 150 MG 24 hr capsule Take 150 mg by mouth 2 (two) times daily.    . carvedilol (COREG) 3.125 MG tablet Take 1 tablet (3.125 mg total) by mouth 2 (two) times daily. 30 tablet 6   No current facility-administered medications for this  visit.     Allergies  Allergen Reactions  . Penicillins Swelling    Swelling of mouth and tongue    Social History   Socioeconomic History  . Marital status: Married    Spouse name: Not on file  . Number of children: 3  . Years of education: Not on file  . Highest education level: Not on file  Occupational History  . Occupation: Worked at [employer]  . Financial resource strain: Not on file  . Food insecurity:    Worry: Not on file    Inability: Not on file  . Transportation needs:    Medical: Not on file    Non-medical: Not on file  Tobacco Use  . Smoking status: Former Smoker    Packs/day: 0.50    Years: 30.00    Pack years: 15.00    Types: Cigarettes    Last attempt to quit: 09/15/1975    Years since quitting: 42.1  . Smokeless tobacco: Never Used  Substance and Sexual Activity  . Alcohol use: No  . Drug use: No  . Sexual activity: Not on file  Lifestyle  . Physical activity:    Days per week: Not on file    Minutes per session: Not on file  . Stress: Not on file  Relationships  . Social connections:    Talks on phone: Not on file    Gets together: Not on file    Attends religious service: Not on file    Active member of club or organization: Not on file    Attends meetings of clubs or organizations: Not on file    Relationship status: Not on file  . Intimate partner violence:    Fear of current or ex partner: Not on file    Emotionally abused: Not on file    Physically abused: Not on file    Forced sexual activity: Not on file  Other Topics Concern  . Not on file  Social History Narrative  . Not on file    Family History  Problem Relation Age of Onset  . Lung cancer Mother   . Alzheimer's disease Father   . Diabetes Maternal Grandmother     Review of Systems:  As stated in the HPI and otherwise negative.   BP 128/60   Pulse 72   Ht 5' (1.524 m)   Wt 105 lb (47.6 kg)   SpO2 98%   BMI 20.51 kg/m   Physical  Examination:  General: Well developed, well nourished, NAD  HEENT: OP clear, mucus membranes moist  SKIN: warm, dry. No rashes. Neuro: No focal deficits  Musculoskeletal: Muscle strength  5/5 all ext  Psychiatric: Mood and affect normal  Neck: No JVD, no carotid bruits, no thyromegaly, no lymphadenopathy.  Lungs:Clear bilaterally, no wheezes, rhonci, crackles Cardiovascular: Regular rate and rhythm. No murmurs, gallops or rubs. Abdomen:Soft. Bowel sounds present. Non-tender.  Extremities: No lower extremity edema. Pulses are 2 + in the bilateral DP/PT.  Echo 07/05/17: - Left ventricle: The cavity size was normal. Wall thickness was   increased in a pattern of mild LVH. Systolic function was normal.   The estimated ejection fraction was in the range of 50% to 55%.   Features are consistent with a pseudonormal left ventricular   filling pattern, with concomitant abnormal relaxation and   increased filling pressure (grade 2 diastolic dysfunction). - Aortic valve: There was mild regurgitation. - Mitral valve: There was mild regurgitation. - Left atrium: The atrium was severely dilated. - Pulmonary arteries: Systolic pressure was mildly increased. PA   peak pressure: 35 mm Hg (S).  EKG:  EKG is not ordered today. The ekg ordered today demonstrates    Recent Labs: 07/04/2017: ALT 12; Platelets 382; TSH 5.560 07/05/2017: Hemoglobin 11.9 08/20/2017: NT-Pro BNP 3,103 09/06/2017: BUN 28; Creatinine, Ser 1.68; Potassium 4.6; Sodium 137   Lipid Panel No results found for: CHOL, TRIG, HDL, CHOLHDL, VLDL, LDLCALC, LDLDIRECT   Wt Readings from Last 3 Encounters:  10/14/17 105 lb (47.6 kg)  08/29/17 106 lb 12.8 oz (48.4 kg)  08/07/17 109 lb 1.6 oz (49.5 kg)     Other studies Reviewed: Additional studies/ records that were reviewed today include: all old records. Review of the above records demonstrates:   Assessment and Plan:   1. Atrial fibrillation,paroxysmal: She appears to be in  sinus today. Rate controlled on amiodarone and Coreg. No palpitations. Labs up to date. She has yearly eye exams.   2. Chronic combined systolic and diastolic CHF:  Her weight is stable. No dyspnea. Continue Lasix as needed.   3. HTN: BP is controlled. Given her fatigue, will try lowering Coreg to 3.125 mg po BID.   4. CAD without angina: Mild CAD by cath in 2002. No chest pain. Continue beta blocker. She is on ASA since she is on Eliquis.   5. Non-ischemic cardiomyopathy: LVEF=50-55% by echo in December 2018. Will continue beta blocker. No Aceinh/ARB due to renal insufficiency.    Current medicines are reviewed at length with the patient today.  The patient does not have concerns regarding medicines.  The following changes have been made:  no change  Labs/ tests ordered today include:   No orders of the defined types were placed in this encounter.  Disposition:   FU with me in 6 months   Signed, Lauree Chandler, MD 10/14/2017 12:02 PM    [address], [place], [place]  [address] Phone: [phone]; Fax: [phone]

## 2017-10-18 DIAGNOSIS — M25551 Pain in right hip: Secondary | ICD-10-CM | POA: Diagnosis not present

## 2017-10-23 ENCOUNTER — Other Ambulatory Visit: Payer: Self-pay | Admitting: Cardiovascular Disease

## 2017-10-23 NOTE — Telephone Encounter (Signed)
Age 84 years WT 47.6kg 10/14/2017 Saw Dr Angelena Form 10/14/2017 09/06/2017 SrCr 1.68 07/04/2017 Hgb 10.2 HCT 33.7 Refill done for Eliquis 2.71m q 12 hours as requested

## 2017-10-24 DIAGNOSIS — M6281 Muscle weakness (generalized): Secondary | ICD-10-CM | POA: Diagnosis not present

## 2017-10-24 DIAGNOSIS — R262 Difficulty in walking, not elsewhere classified: Secondary | ICD-10-CM | POA: Diagnosis not present

## 2017-10-24 DIAGNOSIS — M25551 Pain in right hip: Secondary | ICD-10-CM | POA: Diagnosis not present

## 2017-11-04 DIAGNOSIS — M6281 Muscle weakness (generalized): Secondary | ICD-10-CM | POA: Diagnosis not present

## 2017-11-04 DIAGNOSIS — R262 Difficulty in walking, not elsewhere classified: Secondary | ICD-10-CM | POA: Diagnosis not present

## 2017-11-04 DIAGNOSIS — M25551 Pain in right hip: Secondary | ICD-10-CM | POA: Diagnosis not present

## 2017-11-06 DIAGNOSIS — M25551 Pain in right hip: Secondary | ICD-10-CM | POA: Diagnosis not present

## 2017-11-06 DIAGNOSIS — M6281 Muscle weakness (generalized): Secondary | ICD-10-CM | POA: Diagnosis not present

## 2017-11-06 DIAGNOSIS — R262 Difficulty in walking, not elsewhere classified: Secondary | ICD-10-CM | POA: Diagnosis not present

## 2017-11-13 DIAGNOSIS — M25551 Pain in right hip: Secondary | ICD-10-CM | POA: Diagnosis not present

## 2017-11-13 DIAGNOSIS — R262 Difficulty in walking, not elsewhere classified: Secondary | ICD-10-CM | POA: Diagnosis not present

## 2017-11-13 DIAGNOSIS — M6281 Muscle weakness (generalized): Secondary | ICD-10-CM | POA: Diagnosis not present

## 2017-11-14 ENCOUNTER — Other Ambulatory Visit: Payer: Self-pay | Admitting: Cardiovascular Disease

## 2017-11-14 MED ORDER — APIXABAN 2.5 MG PO TABS: 2.5000 mg | ORAL_TABLET | Freq: Two times a day (BID) | ORAL | 0 refills | Status: DC

## 2017-11-14 NOTE — Telephone Encounter (Signed)
°*  STAT* If patient is at the pharmacy, call can be transferred to refill team.   1. Which medications need to be refilled? (please list name of each medication and dose if known) Eliquis 2.37m-send to her local pharmacy until her mail order comes in  2. Which pharmacy/location (including street and city if local pharmacy) is medication to be sent to?[facility] [phone] 3. Do they need a 30 day or 90 day supply?#20-enough until her mail order comes in

## 2017-11-14 NOTE — Telephone Encounter (Signed)
Pt. Is a 84 yr old female who saw Dr Angelena Form on 10/14/17. Weight was 47.6Kg at that visit. Serum creatine on 09/06/17 was 1.68. Will send Rx for Eliquis 2.80m BID.

## 2017-11-18 DIAGNOSIS — R262 Difficulty in walking, not elsewhere classified: Secondary | ICD-10-CM | POA: Diagnosis not present

## 2017-11-18 DIAGNOSIS — M6281 Muscle weakness (generalized): Secondary | ICD-10-CM | POA: Diagnosis not present

## 2017-11-18 DIAGNOSIS — M25551 Pain in right hip: Secondary | ICD-10-CM | POA: Diagnosis not present

## 2017-11-20 DIAGNOSIS — R262 Difficulty in walking, not elsewhere classified: Secondary | ICD-10-CM | POA: Diagnosis not present

## 2017-11-20 DIAGNOSIS — M6281 Muscle weakness (generalized): Secondary | ICD-10-CM | POA: Diagnosis not present

## 2017-11-20 DIAGNOSIS — M25551 Pain in right hip: Secondary | ICD-10-CM | POA: Diagnosis not present

## 2017-12-02 DIAGNOSIS — R262 Difficulty in walking, not elsewhere classified: Secondary | ICD-10-CM | POA: Diagnosis not present

## 2017-12-02 DIAGNOSIS — M25551 Pain in right hip: Secondary | ICD-10-CM | POA: Diagnosis not present

## 2017-12-02 DIAGNOSIS — M6281 Muscle weakness (generalized): Secondary | ICD-10-CM | POA: Diagnosis not present

## 2017-12-04 DIAGNOSIS — M25551 Pain in right hip: Secondary | ICD-10-CM | POA: Diagnosis not present

## 2017-12-30 ENCOUNTER — Other Ambulatory Visit: Payer: Self-pay | Admitting: Cardiovascular Disease

## 2018-01-01 DIAGNOSIS — R946 Abnormal results of thyroid function studies: Secondary | ICD-10-CM | POA: Diagnosis not present

## 2018-01-01 DIAGNOSIS — I700 Atherosclerosis of aorta: Secondary | ICD-10-CM | POA: Diagnosis not present

## 2018-01-01 DIAGNOSIS — I129 Hypertensive chronic kidney disease with stage 1 through stage 4 chronic kidney disease, or unspecified chronic kidney disease: Secondary | ICD-10-CM | POA: Diagnosis not present

## 2018-01-01 DIAGNOSIS — D638 Anemia in other chronic diseases classified elsewhere: Secondary | ICD-10-CM | POA: Diagnosis not present

## 2018-01-01 DIAGNOSIS — N183 Chronic kidney disease, stage 3 (moderate): Secondary | ICD-10-CM | POA: Diagnosis not present

## 2018-01-01 DIAGNOSIS — F339 Major depressive disorder, recurrent, unspecified: Secondary | ICD-10-CM | POA: Diagnosis not present

## 2018-01-01 DIAGNOSIS — E440 Moderate protein-calorie malnutrition: Secondary | ICD-10-CM | POA: Diagnosis not present

## 2018-01-01 DIAGNOSIS — M810 Age-related osteoporosis without current pathological fracture: Secondary | ICD-10-CM | POA: Diagnosis not present

## 2018-02-05 ENCOUNTER — Emergency Department (HOSPITAL_COMMUNITY): Payer: Medicare Other

## 2018-02-05 ENCOUNTER — Encounter (HOSPITAL_COMMUNITY): Payer: Self-pay | Admitting: *Deleted

## 2018-02-05 ENCOUNTER — Emergency Department (HOSPITAL_COMMUNITY)
Admission: EM | Admit: 2018-02-05 | Discharge: 2018-02-05 | Disposition: A | Discharge status: Home / Self Care | Payer: Medicare Other | Attending: Emergency Medicine | Admitting: Emergency Medicine

## 2018-02-05 ENCOUNTER — Other Ambulatory Visit: Payer: Self-pay

## 2018-02-05 DIAGNOSIS — Y929 Unspecified place or not applicable: Secondary | ICD-10-CM | POA: Diagnosis not present

## 2018-02-05 DIAGNOSIS — S299XXA Unspecified injury of thorax, initial encounter: Secondary | ICD-10-CM | POA: Diagnosis not present

## 2018-02-05 DIAGNOSIS — W0110XA Fall on same level from slipping, tripping and stumbling with subsequent striking against unspecified object, initial encounter: Secondary | ICD-10-CM | POA: Diagnosis not present

## 2018-02-05 DIAGNOSIS — Z87891 Personal history of nicotine dependence: Secondary | ICD-10-CM | POA: Insufficient documentation

## 2018-02-05 DIAGNOSIS — S2231XA Fracture of one rib, right side, initial encounter for closed fracture: Secondary | ICD-10-CM | POA: Diagnosis not present

## 2018-02-05 DIAGNOSIS — S41101A Unspecified open wound of right upper arm, initial encounter: Secondary | ICD-10-CM | POA: Diagnosis not present

## 2018-02-05 DIAGNOSIS — I110 Hypertensive heart disease with heart failure: Secondary | ICD-10-CM | POA: Diagnosis not present

## 2018-02-05 DIAGNOSIS — S2232XA Fracture of one rib, left side, initial encounter for closed fracture: Secondary | ICD-10-CM | POA: Diagnosis not present

## 2018-02-05 DIAGNOSIS — S2241XA Multiple fractures of ribs, right side, initial encounter for closed fracture: Secondary | ICD-10-CM | POA: Insufficient documentation

## 2018-02-05 DIAGNOSIS — Y999 Unspecified external cause status: Secondary | ICD-10-CM | POA: Diagnosis not present

## 2018-02-05 DIAGNOSIS — I509 Heart failure, unspecified: Secondary | ICD-10-CM | POA: Insufficient documentation

## 2018-02-05 DIAGNOSIS — S0990XA Unspecified injury of head, initial encounter: Secondary | ICD-10-CM | POA: Diagnosis not present

## 2018-02-05 DIAGNOSIS — S61402A Unspecified open wound of left hand, initial encounter: Secondary | ICD-10-CM | POA: Diagnosis not present

## 2018-02-05 DIAGNOSIS — Z79899 Other long term (current) drug therapy: Secondary | ICD-10-CM | POA: Diagnosis not present

## 2018-02-05 DIAGNOSIS — Y939 Activity, unspecified: Secondary | ICD-10-CM | POA: Insufficient documentation

## 2018-02-05 LAB — CBC
HCT: 31.3 % — ABNORMAL LOW (ref 36.0–46.0)
HEMOGLOBIN: 8.7 g/dL — AB (ref 12.0–15.0)
MCH: 26.8 pg (ref 26.0–34.0)
MCHC: 27.8 g/dL — ABNORMAL LOW (ref 30.0–36.0)
MCV: 96.3 fL (ref 78.0–100.0)
Platelets: 217 10*3/uL (ref 150–400)
RBC: 3.25 MIL/uL — ABNORMAL LOW (ref 3.87–5.11)
RDW: 13.9 % (ref 11.5–15.5)
WBC: 4.2 10*3/uL (ref 4.0–10.5)

## 2018-02-05 LAB — BASIC METABOLIC PANEL
ANION GAP: 10 (ref 5–15)
BUN: 28 mg/dL — ABNORMAL HIGH (ref 8–23)
CALCIUM: 8.3 mg/dL — AB (ref 8.9–10.3)
CO2: 18 mmol/L — ABNORMAL LOW (ref 22–32)
Chloride: 113 mmol/L — ABNORMAL HIGH (ref 98–111)
Creatinine, Ser: 1.6 mg/dL — ABNORMAL HIGH (ref 0.44–1.00)
GFR calc non Af Amer: 29 mL/min — ABNORMAL LOW (ref >60)
GFR, EST AFRICAN AMERICAN: 33 mL/min — AB (ref >60)
Glucose, Bld: 86 mg/dL (ref 70–99)
Potassium: 4.6 mmol/L (ref 3.5–5.1)
Sodium: 141 mmol/L (ref 135–145)

## 2018-02-05 LAB — PROTIME-INR
INR: 1.09
PROTHROMBIN TIME: 14 s (ref 11.4–15.2)

## 2018-02-05 MED ORDER — DOXYCYCLINE HYCLATE 100 MG PO CAPS: 100.0000 mg | ORAL_CAPSULE | Freq: Two times a day (BID) | ORAL | 0 refills | Status: AC

## 2018-02-05 MED ORDER — ACETAMINOPHEN 500 MG PO TABS
1000.0000 mg | ORAL_TABLET | Freq: Once | ORAL | Status: AC
  Administered 2018-02-05: 1000 mg via ORAL
  Filled 2018-02-05: qty 2

## 2018-02-05 MED ORDER — SODIUM CHLORIDE 0.9 % IV BOLUS
1000.0000 mL | Freq: Once | INTRAVENOUS | Status: AC
Start: 2018-02-05 — End: 2018-02-05
  Administered 2018-02-05: 1000 mL via INTRAVENOUS

## 2018-02-05 MED ORDER — TRAMADOL HCL 50 MG PO TABS: 50.0000 mg | ORAL_TABLET | Freq: Three times a day (TID) | ORAL | 0 refills | Status: DC | PRN

## 2018-02-05 NOTE — ED Provider Notes (Signed)
Patient placed in Quick Look pathway, seen and evaluated   Chief Complaint: sent from urgent care  HPI:   Fall 5 days ago, landing on right side/arm/ribs.  [facility] x-ray shows mildly displaced R 6th fx with pleural effusion. Pt has minimal pain with inspiration/movement.  On eloquis. No head trauma.   ROS:  No fevers, cough, frank SOB or CP with exertion.   Physical Exam:   Gen: No distress  Neuro: Awake and Alert  Skin: Warm    Focused Exam: Well appearing 84 yo.  Mild tenderness to area below right breast; no ecchymosis. Deep breathing without significant discomfort. Head atraumatic.    Initiation of care has begun. The patient has been counseled on the process, plan, and necessity for staying for the completion/evaluation, and the remainder of the medical screening examination    Arlean Hopping 02/05/18 1555    Hayden Rasmussen, MD 02/07/18 [phone]

## 2018-02-05 NOTE — Discharge Instructions (Addendum)
Use your incentive spirometer as we discussed.  It is very important that you take your Tylenol 650 mg twice a day.  I would recommend taking this scheduled, at the same time of the day, for the next 3 to 4 days.  It is important to stay on top of your pain.  If you start to have severe pain or pain with moving around, that Tylenol does not control, I have given you a stronger pain medication.  Take this with food and be careful that it does not make you overly sedated.  Follow-up with your doctor in 2 to 3 days for repeat checkup given the number of rib fractures.
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
## 2018-02-05 NOTE — ED Triage Notes (Signed)
The pt is c/o a rib fracture pain where she fell this past Saturday while she was out of town rt lower rib pain  She was sent here from urgent care   Pain when she breathes

## 2018-02-10 DIAGNOSIS — M810 Age-related osteoporosis without current pathological fracture: Secondary | ICD-10-CM | POA: Diagnosis not present

## 2018-02-10 DIAGNOSIS — S2239XA Fracture of one rib, unspecified side, initial encounter for closed fracture: Secondary | ICD-10-CM | POA: Diagnosis not present

## 2018-02-10 DIAGNOSIS — J180 Bronchopneumonia, unspecified organism: Secondary | ICD-10-CM | POA: Diagnosis not present

## 2018-02-13 ENCOUNTER — Other Ambulatory Visit: Payer: Self-pay | Admitting: Family Medicine

## 2018-02-13 ENCOUNTER — Ambulatory Visit
Admission: RE | Admit: 2018-02-13 | Discharge: 2018-02-13 | Disposition: A | Discharge status: Home / Self Care | Payer: Medicare Other | Source: Ambulatory Visit | Attending: Family Medicine | Admitting: Family Medicine

## 2018-02-13 DIAGNOSIS — S2249XA Multiple fractures of ribs, unspecified side, initial encounter for closed fracture: Secondary | ICD-10-CM

## 2018-02-24 DIAGNOSIS — Z961 Presence of intraocular lens: Secondary | ICD-10-CM | POA: Diagnosis not present

## 2018-02-24 DIAGNOSIS — H401122 Primary open-angle glaucoma, left eye, moderate stage: Secondary | ICD-10-CM | POA: Diagnosis not present

## 2018-02-24 DIAGNOSIS — H401111 Primary open-angle glaucoma, right eye, mild stage: Secondary | ICD-10-CM | POA: Diagnosis not present

## 2018-03-06 DIAGNOSIS — S81811A Laceration without foreign body, right lower leg, initial encounter: Secondary | ICD-10-CM | POA: Diagnosis not present

## 2018-03-21 DIAGNOSIS — E440 Moderate protein-calorie malnutrition: Secondary | ICD-10-CM | POA: Diagnosis not present

## 2018-03-21 DIAGNOSIS — N183 Chronic kidney disease, stage 3 (moderate): Secondary | ICD-10-CM | POA: Diagnosis not present

## 2018-03-21 DIAGNOSIS — I129 Hypertensive chronic kidney disease with stage 1 through stage 4 chronic kidney disease, or unspecified chronic kidney disease: Secondary | ICD-10-CM | POA: Diagnosis not present

## 2018-03-21 DIAGNOSIS — J209 Acute bronchitis, unspecified: Secondary | ICD-10-CM | POA: Diagnosis not present

## 2018-03-25 ENCOUNTER — Emergency Department (HOSPITAL_COMMUNITY): Payer: Medicare Other

## 2018-03-25 ENCOUNTER — Other Ambulatory Visit: Payer: Self-pay

## 2018-03-25 ENCOUNTER — Inpatient Hospital Stay (HOSPITAL_COMMUNITY)
Admission: EM | Admit: 2018-03-25 | Discharge: 2018-03-28 | DRG: 871 | Disposition: A | Discharge status: Home / Self Care | Payer: Medicare Other | Attending: Family Medicine | Admitting: Family Medicine

## 2018-03-25 ENCOUNTER — Encounter (HOSPITAL_COMMUNITY): Payer: Self-pay | Admitting: *Deleted

## 2018-03-25 DIAGNOSIS — Z7901 Long term (current) use of anticoagulants: Secondary | ICD-10-CM | POA: Diagnosis not present

## 2018-03-25 DIAGNOSIS — N183 Chronic kidney disease, stage 3 (moderate): Secondary | ICD-10-CM | POA: Diagnosis present

## 2018-03-25 DIAGNOSIS — A419 Sepsis, unspecified organism: Principal | ICD-10-CM | POA: Diagnosis present

## 2018-03-25 DIAGNOSIS — F329 Major depressive disorder, single episode, unspecified: Secondary | ICD-10-CM | POA: Diagnosis present

## 2018-03-25 DIAGNOSIS — K5090 Crohn's disease, unspecified, without complications: Secondary | ICD-10-CM | POA: Diagnosis present

## 2018-03-25 DIAGNOSIS — J181 Lobar pneumonia, unspecified organism: Secondary | ICD-10-CM | POA: Diagnosis present

## 2018-03-25 DIAGNOSIS — I130 Hypertensive heart and chronic kidney disease with heart failure and stage 1 through stage 4 chronic kidney disease, or unspecified chronic kidney disease: Secondary | ICD-10-CM | POA: Diagnosis present

## 2018-03-25 DIAGNOSIS — Z79899 Other long term (current) drug therapy: Secondary | ICD-10-CM | POA: Diagnosis not present

## 2018-03-25 DIAGNOSIS — Z85038 Personal history of other malignant neoplasm of large intestine: Secondary | ICD-10-CM | POA: Diagnosis not present

## 2018-03-25 DIAGNOSIS — I480 Paroxysmal atrial fibrillation: Secondary | ICD-10-CM | POA: Diagnosis present

## 2018-03-25 DIAGNOSIS — I5022 Chronic systolic (congestive) heart failure: Secondary | ICD-10-CM | POA: Diagnosis present

## 2018-03-25 DIAGNOSIS — J90 Pleural effusion, not elsewhere classified: Secondary | ICD-10-CM | POA: Diagnosis not present

## 2018-03-25 DIAGNOSIS — Z880 Allergy status to penicillin: Secondary | ICD-10-CM | POA: Diagnosis not present

## 2018-03-25 DIAGNOSIS — J449 Chronic obstructive pulmonary disease, unspecified: Secondary | ICD-10-CM

## 2018-03-25 DIAGNOSIS — I5033 Acute on chronic diastolic (congestive) heart failure: Secondary | ICD-10-CM | POA: Diagnosis not present

## 2018-03-25 DIAGNOSIS — R748 Abnormal levels of other serum enzymes: Secondary | ICD-10-CM | POA: Diagnosis not present

## 2018-03-25 DIAGNOSIS — I248 Other forms of acute ischemic heart disease: Secondary | ICD-10-CM | POA: Diagnosis not present

## 2018-03-25 DIAGNOSIS — J189 Pneumonia, unspecified organism: Secondary | ICD-10-CM | POA: Diagnosis not present

## 2018-03-25 DIAGNOSIS — Z7983 Long term (current) use of bisphosphonates: Secondary | ICD-10-CM

## 2018-03-25 DIAGNOSIS — I361 Nonrheumatic tricuspid (valve) insufficiency: Secondary | ICD-10-CM | POA: Diagnosis not present

## 2018-03-25 DIAGNOSIS — I5032 Chronic diastolic (congestive) heart failure: Secondary | ICD-10-CM | POA: Diagnosis not present

## 2018-03-25 DIAGNOSIS — I4891 Unspecified atrial fibrillation: Secondary | ICD-10-CM | POA: Diagnosis not present

## 2018-03-25 DIAGNOSIS — R42 Dizziness and giddiness: Secondary | ICD-10-CM | POA: Diagnosis not present

## 2018-03-25 DIAGNOSIS — I2510 Atherosclerotic heart disease of native coronary artery without angina pectoris: Secondary | ICD-10-CM | POA: Diagnosis present

## 2018-03-25 DIAGNOSIS — Z87891 Personal history of nicotine dependence: Secondary | ICD-10-CM

## 2018-03-25 DIAGNOSIS — N179 Acute kidney failure, unspecified: Secondary | ICD-10-CM | POA: Diagnosis present

## 2018-03-25 DIAGNOSIS — J9601 Acute respiratory failure with hypoxia: Secondary | ICD-10-CM | POA: Diagnosis not present

## 2018-03-25 DIAGNOSIS — I351 Nonrheumatic aortic (valve) insufficiency: Secondary | ICD-10-CM | POA: Diagnosis not present

## 2018-03-25 DIAGNOSIS — Z9049 Acquired absence of other specified parts of digestive tract: Secondary | ICD-10-CM | POA: Diagnosis not present

## 2018-03-25 DIAGNOSIS — E860 Dehydration: Secondary | ICD-10-CM | POA: Diagnosis present

## 2018-03-25 LAB — URINALYSIS, ROUTINE W REFLEX MICROSCOPIC
BILIRUBIN URINE: NEGATIVE
Glucose, UA: NEGATIVE mg/dL
Hgb urine dipstick: NEGATIVE
KETONES UR: NEGATIVE mg/dL
Nitrite: NEGATIVE
Protein, ur: NEGATIVE mg/dL
SPECIFIC GRAVITY, URINE: 1.009 (ref 1.005–1.030)
pH: 5 (ref 5.0–8.0)

## 2018-03-25 LAB — BASIC METABOLIC PANEL
Anion gap: 9 (ref 5–15)
BUN: 26 mg/dL — AB (ref 8–23)
CALCIUM: 8.5 mg/dL — AB (ref 8.9–10.3)
CO2: 22 mmol/L (ref 22–32)
CREATININE: 2.07 mg/dL — AB (ref 0.44–1.00)
Chloride: 102 mmol/L (ref 98–111)
GFR calc non Af Amer: 21 mL/min — ABNORMAL LOW (ref >60)
GFR, EST AFRICAN AMERICAN: 24 mL/min — AB (ref >60)
GLUCOSE: 123 mg/dL — AB (ref 70–99)
Potassium: 4.7 mmol/L (ref 3.5–5.1)
Sodium: 133 mmol/L — ABNORMAL LOW (ref 135–145)

## 2018-03-25 LAB — I-STAT TROPONIN, ED: TROPONIN I, POC: 0.03 ng/mL (ref 0.00–0.08)

## 2018-03-25 LAB — CBC
HCT: 30.3 % — ABNORMAL LOW (ref 36.0–46.0)
Hemoglobin: 8.9 g/dL — ABNORMAL LOW (ref 12.0–15.0)
MCH: 26.5 pg (ref 26.0–34.0)
MCHC: 29.4 g/dL — AB (ref 30.0–36.0)
MCV: 90.2 fL (ref 78.0–100.0)
PLATELETS: 246 10*3/uL (ref 150–400)
RBC: 3.36 MIL/uL — ABNORMAL LOW (ref 3.87–5.11)
RDW: 13 % (ref 11.5–15.5)
WBC: 7 10*3/uL (ref 4.0–10.5)

## 2018-03-25 LAB — I-STAT CG4 LACTIC ACID, ED: Lactic Acid, Venous: 0.81 mmol/L (ref 0.5–1.9)

## 2018-03-25 MED ORDER — ONDANSETRON HCL 4 MG PO TABS: 4.0000 mg | ORAL_TABLET | Freq: Four times a day (QID) | ORAL | Status: DC | PRN

## 2018-03-25 MED ORDER — ACETAMINOPHEN 650 MG RE SUPP: 650.0000 mg | Freq: Four times a day (QID) | RECTAL | Status: DC | PRN

## 2018-03-25 MED ORDER — MIRTAZAPINE 15 MG PO TABS
15.0000 mg | ORAL_TABLET | Freq: Every day | ORAL | Status: DC
  Administered 2018-03-25 – 2018-03-27 (×3): 15 mg via ORAL
  Filled 2018-03-25 (×3): qty 1

## 2018-03-25 MED ORDER — ONDANSETRON HCL 4 MG/2ML IJ SOLN: 4.0000 mg | Freq: Four times a day (QID) | INTRAMUSCULAR | Status: DC | PRN

## 2018-03-25 MED ORDER — ALENDRONATE SODIUM 70 MG PO TABS: 70.0000 mg | ORAL_TABLET | ORAL | Status: DC

## 2018-03-25 MED ORDER — LEVOFLOXACIN IN D5W 500 MG/100ML IV SOLN
500.0000 mg | INTRAVENOUS | Status: DC
  Administered 2018-03-27: 500 mg via INTRAVENOUS
  Filled 2018-03-25: qty 100

## 2018-03-25 MED ORDER — APIXABAN 2.5 MG PO TABS
2.5000 mg | ORAL_TABLET | Freq: Two times a day (BID) | ORAL | Status: DC
  Administered 2018-03-25 – 2018-03-28 (×6): 2.5 mg via ORAL
  Filled 2018-03-25 (×6): qty 1

## 2018-03-25 MED ORDER — SODIUM CHLORIDE 0.9 % IV BOLUS
500.0000 mL | Freq: Once | INTRAVENOUS | Status: AC
  Administered 2018-03-25: 500 mL via INTRAVENOUS

## 2018-03-25 MED ORDER — ACETAMINOPHEN 325 MG PO TABS
650.0000 mg | ORAL_TABLET | Freq: Four times a day (QID) | ORAL | Status: DC | PRN
  Administered 2018-03-27: 650 mg via ORAL
  Filled 2018-03-25: qty 2

## 2018-03-25 MED ORDER — AMIODARONE HCL 200 MG PO TABS
200.0000 mg | ORAL_TABLET | Freq: Every day | ORAL | Status: DC
  Administered 2018-03-25 – 2018-03-28 (×4): 200 mg via ORAL
  Filled 2018-03-25 (×4): qty 1

## 2018-03-25 MED ORDER — LOPERAMIDE HCL 2 MG PO CAPS: 4.0000 mg | ORAL_CAPSULE | Freq: Four times a day (QID) | ORAL | Status: DC | PRN

## 2018-03-25 MED ORDER — ENOXAPARIN SODIUM 40 MG/0.4ML ~~LOC~~ SOLN: 40.0000 mg | SUBCUTANEOUS | Status: DC

## 2018-03-25 MED ORDER — VENLAFAXINE HCL ER 150 MG PO CP24
150.0000 mg | ORAL_CAPSULE | Freq: Two times a day (BID) | ORAL | Status: DC
  Administered 2018-03-25 – 2018-03-28 (×6): 150 mg via ORAL
  Filled 2018-03-25 (×6): qty 1

## 2018-03-25 MED ORDER — FAMOTIDINE 20 MG PO TABS
20.0000 mg | ORAL_TABLET | Freq: Every day | ORAL | Status: DC
  Administered 2018-03-25 – 2018-03-28 (×4): 20 mg via ORAL
  Filled 2018-03-25 (×4): qty 1

## 2018-03-25 MED ORDER — CARVEDILOL 3.125 MG PO TABS
3.1250 mg | ORAL_TABLET | Freq: Two times a day (BID) | ORAL | Status: DC
  Administered 2018-03-25 – 2018-03-28 (×6): 3.125 mg via ORAL
  Filled 2018-03-25 (×6): qty 1

## 2018-03-25 MED ORDER — LEVOFLOXACIN IN D5W 750 MG/150ML IV SOLN
750.0000 mg | Freq: Once | INTRAVENOUS | Status: AC
  Administered 2018-03-25: 750 mg via INTRAVENOUS
  Filled 2018-03-25: qty 150

## 2018-03-25 NOTE — ED Triage Notes (Signed)
Pt in c/o lower blood pressure at home, states she normally checks her BP daily and takes medication for hypertension, this morning she didn't feel right and felt weak, took BP at home and it was in the 80s, alert and oriented, no distress, reports mild dizziness, increased urination this morning as well

## 2018-03-25 NOTE — ED Triage Notes (Signed)
Pt reports she was treated for bronchitis last Friday and started on an antibiotic, that gave her severe diarrhea so she stopped the antibiotic after one dose, pt reports improvement in bronchitis symptoms

## 2018-03-25 NOTE — H&P (Signed)
History and Physical    Lauraine Crespo BZJ:[id number] DOB: 07/13/1933 DOA: 03/25/2018  PCP: Harlan Stains, MD   Patient coming from: Home   Chief Complaint: Generalized weakenss  HPI: Melissa Mcdaniel is a 84 y.o. female with medical history significant of hypertension, coronary artery disease, chronic systolic heart failure, chronic atrial fibrillation, Crohn's disease, and history of colon cancer.  Patient has been not feeling well for the last 7 days prior to hospitalization, consistent with dyspnea, coughing, congestion, and generalized malaise.  She was seen by the outpatient clinic 4 days ago, she was diagnosed with bronchitis, she was prescribed inhalers and an oral antibiotic.  After the first dose of the oral antibiotic she experienced significant gastrointestinal symptoms consistent with diarrhea and she stopped taking further antibiotics.  Over the course of the following days she continued to feel poorly, today she felt severely weak, associated with dizziness and disbalance, no improving or worsening factors.  She took her blood pressure and he was found to be low, she was brought to the hospital by her husband.    ED Course: Patient was found hypotensive, and x-ray revealed a right lower lobe pneumonia, she was placed on IV fluids and IV antibiotics, referred for admission for evaluation.  Review of Systems:  1. General: No fevers, no chills, no weight gain or weight loss. Positive malaise as mentioned in the HPI 2. ENT: No runny nose or sore throat, no hearing disturbances 3. Pulmonary: Positive dyspnea, cough, wheezing, but no hemoptysis 4. Cardiovascular: No angina, claudication, lower extremity edema, pnd or orthopnea 5. Gastrointestinal: Positive nausea no vomiting, but diarrhea 6. Hematology: No easy bruisability or frequent infections 7. Urology: No dysuria, hematuria or increased urinary frequency 8. Dermatology: No rashes. 9. Neurology: No seizures or  paresthesias 10. Musculoskeletal: No joint pain or deformities  Past Medical History:  Diagnosis Date  . A-fib ([place])   . Congestive heart failure (CHF) ([place]) 05/2016  . Crohn's disease ([place])   . Dysrhythmia    afib dx approx. 2016 per pt  . Hip fracture ([place])   . Hypertension     Past Surgical History:  Procedure Laterality Date  . FEMUR IM NAIL Right 07/26/2016   Procedure: INTRAMEDULLARY (IM) NAIL FEMORAL;  Surgeon: Renette Butters, MD;  Location: [facility];  Service: Orthopedics;  Laterality: Right;  . FRACTURE SURGERY    . large intestine - partial removal  approx 1993   due to crohns     reports that she quit smoking about 42 years ago. Her smoking use included cigarettes. She has a 15.00 pack-year smoking history. She has never used smokeless tobacco. She reports that she does not drink alcohol or use drugs.  Allergies  Allergen Reactions  . Penicillins Swelling    Has patient had a PCN reaction causing immediate rash, facial/tongue/throat swelling, SOB or lightheadedness with hypotension: Yes Has patient had a PCN reaction causing severe rash involving mucus membranes or skin necrosis: No Has patient had a PCN reaction that required hospitalization: No Has patient had a PCN reaction occurring within the last 10 years: No If all of the above answers are "NO", then may proceed with Cephalosporin use.     Family History  Problem Relation Age of Onset  . Lung cancer Mother   . Alzheimer's disease Father   . Diabetes Maternal Grandmother      Prior to Admission medications   Medication Sig Start Date End Date Taking? Authorizing Provider  alendronate (FOSAMAX) 70 MG tablet Take  70 mg by mouth once a week. Take with a full glass of water on an empty stomach. Saturdays   Yes [provider]  amiodarone (PACERONE) 200 MG tablet TAKE 1 TABLET DAILY Patient taking differently: Take 200 mg by mouth daily.  12/31/17  Yes Burnell Blanks, MD  apixaban (ELIQUIS)  2.5 MG TABS tablet Take 1 tablet (2.5 mg total) by mouth 2 (two) times daily. 11/14/17  Yes Burnell Blanks, MD  carvedilol (COREG) 3.125 MG tablet Take 1 tablet (3.125 mg total) by mouth 2 (two) times daily. 10/14/17 03/25/26 Yes Burnell Blanks, MD  Cholecalciferol (VITAMIN D3) 2000 units TABS Take 2,000 Units by mouth daily.    Yes [provider]  clonazePAM (KLONOPIN) 0.5 MG tablet TK 1 T PO BID Patient taking differently: Take 0.5 mg by mouth 2 (two) times daily.  07/30/16  Yes Theodis Blaze, MD  Ferrous Sulfate (IRON) 28 MG TABS Take 1 tablet by mouth daily.   Yes [provider]  furosemide (LASIX) 20 MG tablet Take 1 tablet daily only as needed for weight gain Patient taking differently: Take 20 mg by mouth daily as needed for fluid or edema.  08/30/17  Yes Lyda Jester M, PA-C  hydrALAZINE (APRESOLINE) 25 MG tablet Take 25 mg by mouth 2 (two) times daily.   Yes [provider]  latanoprost (XALATAN) 0.005 % ophthalmic solution Place 1 drop into both eyes at bedtime.   Yes [provider]  loperamide (IMODIUM) 2 MG capsule Take 4 mg by mouth 4 (four) times daily as needed for diarrhea or loose stools.   Yes [provider]  mirtazapine (REMERON) 15 MG tablet Take 15 mg by mouth at bedtime.    Yes [provider]  potassium chloride (K-DUR) 10 MEQ tablet Take 1 tablet only when you take a Lasix. Patient taking differently: Take 10 mEq by mouth daily as needed (with the Lasix).  08/30/17  Yes Rosita Fire, Brittainy M, PA-C  ranitidine (ZANTAC) 150 MG tablet Take 150 mg by mouth daily as needed for heartburn.   Yes [provider]  venlafaxine XR (EFFEXOR-XR) 150 MG 24 hr capsule Take 150 mg by mouth 2 (two) times daily.   Yes [provider]  traMADol (ULTRAM) 50 MG tablet Take 1 tablet (50 mg total) by mouth every 8 (eight) hours as needed for severe pain. Patient not taking: Reported on 03/25/2018 02/05/18    Duffy Bruce, MD    Physical Exam: Vitals:   03/25/18 1715 03/25/18 1730 03/25/18 1745 03/25/18 1900  BP: (!) 131/53 (!) 131/57 (!) 111/52 (!) 141/55  Pulse: 65 68 69 71  Resp: 16 15 (!) 25 17  Temp:      TempSrc:      SpO2: 97% 97% 96% 95%  Weight:      Height:        Constitutional: deconditioned and ill looking appearing Vitals:   03/25/18 1715 03/25/18 1730 03/25/18 1745 03/25/18 1900  BP: (!) 131/53 (!) 131/57 (!) 111/52 (!) 141/55  Pulse: 65 68 69 71  Resp: 16 15 (!) 25 17  Temp:      TempSrc:      SpO2: 97% 97% 96% 95%  Weight:      Height:       Eyes: PERRL, lids and conjunctivae pale. Head normocephalic, nose and ears with no deformities ENMT: Mucous membranes are dry. Posterior pharynx clear of any exudate or lesions.Normal dentition.  Neck: normal, supple, no  masses, no thyromegaly Respiratory: positive breath sounds on auscultation bilaterally, no wheezing, right basal crackles.  No accessory muscle use.  Cardiovascular: Regular rate and rhythm, no murmurs / rubs / gallops. No extremity edema. 2+ pedal pulses. No carotid bruits.  Abdomen: no tenderness, no masses palpated. No hepatosplenomegaly. Bowel sounds positive.  Musculoskeletal: no clubbing / cyanosis. No joint deformity upper and lower extremities. Good ROM, no contractures. Normal muscle tone.  Skin: no rashes, lesions, ulcers. No induration Neurologic: CN 2-12 grossly intact. Sensation intact, DTR normal. Strength 5/5 in all 4.     Labs on Admission: I have personally reviewed following labs and imaging studies  CBC: Recent Labs  Lab 03/25/18 1437  WBC 7.0  HGB 8.9*  HCT 30.3*  MCV 90.2  PLT 482   Basic Metabolic Panel: Recent Labs  Lab 03/25/18 1437  NA 133*  K 4.7  CL 102  CO2 22  GLUCOSE 123*  BUN 26*  CREATININE 2.07*  CALCIUM 8.5*   GFR: Estimated Creatinine Clearance: 14.8 mL/min (A) (by C-G formula based on SCr of 2.07 mg/dL (H)). Liver Function Tests: No results for  input(s): AST, ALT, ALKPHOS, BILITOT, PROT, ALBUMIN in the last 168 hours. No results for input(s): LIPASE, AMYLASE in the last 168 hours. No results for input(s): AMMONIA in the last 168 hours. Coagulation Profile: No results for input(s): INR, PROTIME in the last 168 hours. Cardiac Enzymes: No results for input(s): CKTOTAL, CKMB, CKMBINDEX, TROPONINI in the last 168 hours. BNP (last 3 results) Recent Labs    07/04/17 1514 08/07/17 1036 08/20/17 1119  PROBNP 4,095* 2,903* 3,103*   HbA1C: No results for input(s): HGBA1C in the last 72 hours. CBG: No results for input(s): GLUCAP in the last 168 hours. Lipid Profile: No results for input(s): CHOL, HDL, LDLCALC, TRIG, CHOLHDL, LDLDIRECT in the last 72 hours. Thyroid Function Tests: No results for input(s): TSH, T4TOTAL, FREET4, T3FREE, THYROIDAB in the last 72 hours. Anemia Panel: No results for input(s): VITAMINB12, FOLATE, FERRITIN, TIBC, IRON, RETICCTPCT in the last 72 hours. Urine analysis:    Component Value Date/Time   COLORURINE YELLOW 08/16/2016 2257   APPEARANCEUR HAZY (A) 08/16/2016 2257   LABSPEC 1.010 08/16/2016 2257   PHURINE 5.0 08/16/2016 2257   GLUCOSEU NEGATIVE 08/16/2016 2257   HGBUR NEGATIVE 08/16/2016 2257   BILIRUBINUR NEGATIVE 08/16/2016 2257   KETONESUR NEGATIVE 08/16/2016 2257   PROTEINUR NEGATIVE 08/16/2016 2257   NITRITE NEGATIVE 08/16/2016 2257   LEUKOCYTESUR TRACE (A) 08/16/2016 2257    Radiological Exams on Admission: Dg Chest 2 View  Result Date: 03/25/2018 CLINICAL DATA:  Weakness. EXAM: CHEST - 2 VIEW COMPARISON:  Radiographs of February 13, 2018. FINDINGS: The heart size and mediastinal contours are within normal limits. Atherosclerosis of thoracic aorta is noted. No pneumothorax or significant pleural effusion is noted. Left lung is clear. Right middle lobe airspace opacity is noted consistent with pneumonia. The visualized skeletal structures are unremarkable. IMPRESSION: Right middle lobe  pneumonia. Followup PA and lateral chest X-ray is recommended in 3-4 weeks following trial of antibiotic therapy to ensure resolution and exclude underlying malignancy. Aortic Atherosclerosis (ICD10-I70.0). Electronically Signed   By: Marijo Conception, M.D.   On: 03/25/2018 16:29    EKG: Independently reviewed.  EKG sinus rhythm, normal axis, poor R wave progression, J-point elevation in V4 to V6, positive LVH per EKG criteria  Assessment/Plan Active Problems:   Sepsis [facility])  84 year old female who presented with generalized malaise, weakness, cough, congestion and hypotension.  On  the initial physical examination blood pressure 84/47, heart rate 72, respiratory rate 14, oxygen saturation 94% on room air.  She is pale with dry mucous membranes, lungs with rales on the right base, heart S1-S2 present, no S3 or S4 gallop, abdomen soft nontender, no lower extremity edema.  Sodium 133, potassium 4.7, chloride 102, bicarb 22, glucose 123, BUN 26, creatinine 2.0, white count 7.0, hemoglobin 8.9, hematocrit 30.3, platelets 246.  Urinalysis with specific gravity 1.009, white cells 6-10, RBC 0-5.  Chest x-ray with signs of hyperinflation, right lower lobe patchy infiltrate with air bronchograms and small right pleural effusion.   Patient will be admitted to the hospital with working diagnosis of sepsis due to right lower lobe community acquired pneumonia complicated by acute kidney injury.   1.  Sepsis due to right lower lobe community-acquired pneumonia, present on admission.  Patient will be admitted to the medical ward, she will be placed on a remote telemetry monitor, continue isotonic fluids with normal saline at 75 ml per hour, antibiotic therapy with intravenous levofloxacin (patient allergic to penicillins). Bronchodilator therapy with DuoNeb, oximetry monitoring and supplemental oxygen per nasal cannula.  Will hold furosemide and hydralazine to avoid hypotension.  Check Legionella and Streptococcus  pneumonia urinary antigens.   2.  Systolic heart failure/ stable with no acute exacerbation.  Last echocardiography from December 2018 showed recovered LV systolic function with ejection fraction 50-55%.  No signs of acute decompensation, continue hydration with isotonic fluids at 75 ml per hour, telemetry monitoring.  Continue beta-blockade with carvedilol.    3.  Acute kidney injury.  Likely prerenal, continue isotonic fluids intravenously, hold on furosemide and hydralazine, avoid further hypotension or nephrotoxic agents.  4.  Paroxysmal atrial fibrillation.  Continue amiodarone 200 g daily, carvedilol 3.125 g twice daily, and anticoagulation with apixaban 2.5 g daily.  5.  Depression.  No confusion or agitation, continue venlafaxine and mirtazapine.  Continue clonazepam.   DVT prophylaxis: apixaban Code Status:  full  Family Communication: no family at the bedside   Disposition Plan: home when improved  Consults called: none   Admission status: inpatient    Tawni Millers MD [facility] Pager [phone]  If 7PM-7AM, please contact night-coverage [email or web address] Password [email or web address]  03/25/2018, 7:10 PM

## 2018-03-25 NOTE — ED Notes (Signed)
IV team at bedside 

## 2018-03-25 NOTE — ED Notes (Signed)
2 RNs attempted to gain IV access without success. IV team consult placed

## 2018-03-25 NOTE — Progress Notes (Signed)
Pharmacy Antibiotic Note  Melissa Mcdaniel is a 84 y.o. female admitted on 03/25/2018 with pneumonia.  Pharmacy has been consulted for levofloxacin dosing. WBC 7.0, Scr 2.07, CrCl ~15. afebrile  Plan: Levofloxacin 775m IV x1 then 5039mIV Q48H F/u renal function, LOT/de-escalation, cultures, clinical status   Height: 5' (152.4 cm) Weight: 102 lb (46.3 kg) IBW/kg (Calculated) : 45.5  Temp (24hrs), Avg:98.2 F (36.8 C), Min:97.8 F (36.6 C), Max:98.6 F (37 C)  Recent Labs  Lab 03/25/18 1437  WBC 7.0  CREATININE 2.07*    Estimated Creatinine Clearance: 14.8 mL/min (A) (by C-G formula based on SCr of 2.07 mg/dL (H)).    Allergies  Allergen Reactions  . Penicillins Swelling    Has patient had a PCN reaction causing immediate rash, facial/tongue/throat swelling, SOB or lightheadedness with hypotension: Yes Has patient had a PCN reaction causing severe rash involving mucus membranes or skin necrosis: No Has patient had a PCN reaction that required hospitalization: No Has patient had a PCN reaction occurring within the last 10 years: No If all of the above answers are "NO", then may proceed with Cephalosporin use.     Antimicrobials this admission: levofloxacin 9/3 >>    Dose adjustments this admission:   Microbiology results:  BCx:   UCx:   Sputum:    MRSA PCR:   Thank you for allowing pharmacy to be a part of this patient's care.  CoHarrietta GuardianPharmD PGY1 Pharmacy Resident 03/25/2018    5:39 PM

## 2018-03-25 NOTE — Progress Notes (Signed)
PHARMACIST - PHYSICIAN COMMUNICATION  CONCERNING: P&T Medication Policy Regarding Oral Bisphosphonates  RECOMMENDATION: Your order for alendronate (Fosamax), ibandronate (Boniva), or risedronate (Actonel) has been discontinued at this time.  If the patient's post-hospital medical condition warrants safe use of this class of drugs, please resume the pre-hospital regimen upon discharge.  DESCRIPTION:  Alendronate (Fosamax), ibandronate (Boniva), and risedronate (Actonel) can cause severe esophageal erosions in patients who are unable to remain upright at least 30 minutes after taking this medication.   Since brief interruptions in therapy are thought to have minimal impact on bone mineral density, the [place] has established that bisphosphonate orders should be routinely discontinued during hospitalization.   To override this safety policy and permit administration of Boniva, Fosamax, or Actonel in the hospital, prescribers must write "DO NOT HOLD" in the comments section when placing the order for this class of medications.   Eddye Broxterman A. Levada Dy, PharmD, [place] Pager: [phone] Please utilize Amion for appropriate phone number to reach the unit pharmacist ([place])

## 2018-03-25 NOTE — ED Notes (Signed)
Patient transported to X-ray 

## 2018-03-25 NOTE — ED Provider Notes (Signed)
[facility] EMERGENCY DEPARTMENT Provider Note   CSN: [id number] Arrival date & time: 03/25/18  1409     History   Chief Complaint Chief Complaint  Patient presents with  . Hypotension    HPI Melissa Mcdaniel is a 84 y.o. female.  Patient is a 84 year old female with a history of hypertension, atrial fibrillation and congestive heart failure who presents with weakness and low blood pressure.  She is had a little mucus in her throat and a little bit of cough for the last several days.  She was seen in urgent care on Friday which was 4 days ago and started on antibiotic for possible bronchitis.  Reportedly she had a chest x-ray at that facility that was negative for pneumonia.  She states the next day she started having a large amount of watery diarrhea.  This lasted for 24 hours and she stopped the antibiotic because of this.  She says the diarrhea resolved after 24 hours but she has not taken any more of the antibiotic.  She woke up this morning feeling very weak and fatigued.  She also has some dizziness on standing.  She took her blood pressure and it was noted to be in the 80s at home.  She came here for further evaluation.  She denies any chest pain or shortness of breath.  No abdominal pain.  No known fevers.  No leg swelling.  No sores or rashes.  She had a little bit of increased urination.     Past Medical History:  Diagnosis Date  . A-fib ([place])   . Congestive heart failure (CHF) ([place]) 05/2016  . Crohn's disease ([place])   . Dysrhythmia    afib dx approx. 2016 per pt  . Hip fracture ([place])   . Hypertension     Patient Active Problem List   Diagnosis Date Noted  . Malnutrition of moderate degree 07/26/2016  . Closed right hip fracture, initial encounter ([place]) 07/24/2016  . Hypertension 07/24/2016  . A-fib ([place]) 07/24/2016  . Fall 07/24/2016  . Congestive heart failure (CHF) ([place]) 05/23/2016    Past Surgical History:  Procedure Laterality Date  .  FEMUR IM NAIL Right 07/26/2016   Procedure: INTRAMEDULLARY (IM) NAIL FEMORAL;  Surgeon: Renette Butters, MD;  Location: [facility];  Service: Orthopedics;  Laterality: Right;  . FRACTURE SURGERY    . large intestine - partial removal  approx 1993   due to crohns     OB History   None      Home Medications    Prior to Admission medications   Medication Sig Start Date End Date Taking? Authorizing Provider  alendronate (FOSAMAX) 70 MG tablet Take 70 mg by mouth once a week. Take with a full glass of water on an empty stomach. Saturdays   Yes [provider]  amiodarone (PACERONE) 200 MG tablet TAKE 1 TABLET DAILY Patient taking differently: Take 200 mg by mouth daily.  12/31/17  Yes Burnell Blanks, MD  apixaban (ELIQUIS) 2.5 MG TABS tablet Take 1 tablet (2.5 mg total) by mouth 2 (two) times daily. 11/14/17  Yes Burnell Blanks, MD  carvedilol (COREG) 3.125 MG tablet Take 1 tablet (3.125 mg total) by mouth 2 (two) times daily. 10/14/17 03/25/26 Yes Burnell Blanks, MD  Cholecalciferol (VITAMIN D3) 2000 units TABS Take 2,000 Units by mouth daily.    Yes [provider]  clonazePAM (KLONOPIN) 0.5 MG tablet TK 1 T PO BID Patient taking differently: Take 0.5  mg by mouth 2 (two) times daily.  07/30/16  Yes Theodis Blaze, MD  Ferrous Sulfate (IRON) 28 MG TABS Take 1 tablet by mouth daily.   Yes [provider]  furosemide (LASIX) 20 MG tablet Take 1 tablet daily only as needed for weight gain Patient taking differently: Take 20 mg by mouth daily as needed for fluid or edema.  08/30/17  Yes Lyda Jester M, PA-C  hydrALAZINE (APRESOLINE) 25 MG tablet Take 25 mg by mouth 2 (two) times daily.   Yes [provider]  latanoprost (XALATAN) 0.005 % ophthalmic solution Place 1 drop into both eyes at bedtime.   Yes [provider]  loperamide (IMODIUM) 2 MG capsule Take 4 mg by mouth 4 (four) times daily as needed for diarrhea or loose stools.    Yes [provider]  mirtazapine (REMERON) 15 MG tablet Take 15 mg by mouth at bedtime.    Yes [provider]  potassium chloride (K-DUR) 10 MEQ tablet Take 1 tablet only when you take a Lasix. Patient taking differently: Take 10 mEq by mouth daily as needed (with the Lasix).  08/30/17  Yes Rosita Fire, Brittainy M, PA-C  ranitidine (ZANTAC) 150 MG tablet Take 150 mg by mouth daily as needed for heartburn.   Yes [provider]  venlafaxine XR (EFFEXOR-XR) 150 MG 24 hr capsule Take 150 mg by mouth 2 (two) times daily.   Yes [provider]  traMADol (ULTRAM) 50 MG tablet Take 1 tablet (50 mg total) by mouth every 8 (eight) hours as needed for severe pain. Patient not taking: Reported on 03/25/2018 02/05/18   Duffy Bruce, MD    Family History Family History  Problem Relation Age of Onset  . Lung cancer Mother   . Alzheimer's disease Father   . Diabetes Maternal Grandmother     Social History Social History   Tobacco Use  . Smoking status: Former Smoker    Packs/day: 0.50    Years: 30.00    Pack years: 15.00    Types: Cigarettes    Last attempt to quit: 09/15/1975    Years since quitting: 42.5  . Smokeless tobacco: Never Used  Substance Use Topics  . Alcohol use: No  . Drug use: No     Allergies   Penicillins   Review of Systems Review of Systems  Constitutional: Positive for fatigue. Negative for chills, diaphoresis and fever.  HENT: Negative for congestion, rhinorrhea and sneezing.   Eyes: Negative.   Respiratory: Positive for cough. Negative for chest tightness and shortness of breath.   Cardiovascular: Negative for chest pain and leg swelling.  Gastrointestinal: Positive for diarrhea. Negative for abdominal pain, blood in stool, nausea and vomiting.  Genitourinary: Negative for difficulty urinating, flank pain, frequency and hematuria.  Musculoskeletal: Negative for arthralgias and back pain.  Skin: Negative for rash.  Neurological:  Positive for light-headedness. Negative for dizziness, speech difficulty, weakness, numbness and headaches.     Physical Exam Updated Vital Signs BP (!) 141/55   Pulse 71   Temp 98.6 F (37 C) (Oral)   Resp 17   Ht 5' (1.524 m)   Wt 46.3 kg   SpO2 95%   BMI 19.92 kg/m   Physical Exam  Constitutional: She is oriented to person, place, and time. She appears well-developed and well-nourished.  HENT:  Head: Normocephalic and atraumatic.  Eyes: Pupils are equal, round, and reactive to light.  Neck: Normal range of motion. Neck supple.  Cardiovascular: Normal rate,  regular rhythm and normal heart sounds.  Pulmonary/Chest: Effort normal and breath sounds normal. No respiratory distress. She has no wheezes. She has no rales. She exhibits no tenderness.  Abdominal: Soft. Bowel sounds are normal. There is no tenderness. There is no rebound and no guarding.  Musculoskeletal: Normal range of motion. She exhibits no edema.  Lymphadenopathy:    She has no cervical adenopathy.  Neurological: She is alert and oriented to person, place, and time.  Skin: Skin is warm and dry. No rash noted.  Psychiatric: She has a normal mood and affect.     ED Treatments / Results  Labs (all labs ordered are listed, but only abnormal results are displayed) Labs Reviewed  BASIC METABOLIC PANEL - Abnormal; Notable for the following components:      Result Value   Sodium 133 (*)    Glucose, Bld 123 (*)    BUN 26 (*)    Creatinine, Ser 2.07 (*)    Calcium 8.5 (*)    GFR calc non Af Amer 21 (*)    GFR calc Af Amer 24 (*)    All other components within normal limits  CBC - Abnormal; Notable for the following components:   RBC 3.36 (*)    Hemoglobin 8.9 (*)    HCT 30.3 (*)    MCHC 29.4 (*)    All other components within normal limits  CULTURE, BLOOD (ROUTINE X 2)  CULTURE, BLOOD (ROUTINE X 2)  URINALYSIS, ROUTINE W REFLEX MICROSCOPIC  I-STAT TROPONIN, ED  I-STAT CG4 LACTIC ACID, ED    EKG EKG  Interpretation  Date/Time:  Tuesday March 25 2018 14:24:06 EDT Ventricular Rate:  78 PR Interval:  144 QRS Duration: 92 QT Interval:  422 QTC Calculation: 481 R Axis:   85 Text Interpretation:  Normal sinus rhythm Left ventricular hypertrophy Abnormal ECG since last tracing no significant change Confirmed by Malvin Johns [phone]) on 03/25/2018 4:16:17 PM   Radiology Dg Chest 2 View  Result Date: 03/25/2018 CLINICAL DATA:  Weakness. EXAM: CHEST - 2 VIEW COMPARISON:  Radiographs of February 13, 2018. FINDINGS: The heart size and mediastinal contours are within normal limits. Atherosclerosis of thoracic aorta is noted. No pneumothorax or significant pleural effusion is noted. Left lung is clear. Right middle lobe airspace opacity is noted consistent with pneumonia. The visualized skeletal structures are unremarkable. IMPRESSION: Right middle lobe pneumonia. Followup PA and lateral chest X-ray is recommended in 3-4 weeks following trial of antibiotic therapy to ensure resolution and exclude underlying malignancy. Aortic Atherosclerosis (ICD10-I70.0). Electronically Signed   By: Marijo Conception, M.D.   On: 03/25/2018 16:29    Procedures Procedures (including critical care time)  Medications Ordered in ED Medications  sodium chloride 0.9 % bolus 500 mL (500 mLs Intravenous New Bag/Given 03/25/18 1857)  levofloxacin (LEVAQUIN) IVPB 750 mg (750 mg Intravenous New Bag/Given 03/25/18 1857)  levofloxacin (LEVAQUIN) IVPB 500 mg (has no administration in time range)     Initial Impression / Assessment and Plan / ED Course  I have reviewed the triage vital signs and the nursing notes.  Pertinent labs & imaging results that were available during my care of the patient were reviewed by me and considered in my medical decision making (see chart for details).     Patient is an 84 year old female who presents with generalized weakness and dizziness.  She was hypotensive on arrival but this has improved  after IV fluids.  Her chest x-ray shows evidence of pneumonia.  She  was started on IV antibiotics.  She also has evidence of acute kidney injury. I spoke with Dr. Cathlean Sauer with the hospitalist service who will admit the pt.  Final Clinical Impressions(s) / ED Diagnoses   Final diagnoses:  Community acquired pneumonia of right middle lobe of lung [facility])    ED Discharge Orders    None       Malvin Johns, MD 03/25/18 1910

## 2018-03-26 LAB — CBC
HEMATOCRIT: 31.6 % — AB (ref 36.0–46.0)
Hemoglobin: 9.5 g/dL — ABNORMAL LOW (ref 12.0–15.0)
MCH: 26.8 pg (ref 26.0–34.0)
MCHC: 30.1 g/dL (ref 30.0–36.0)
MCV: 89 fL (ref 78.0–100.0)
PLATELETS: 200 10*3/uL (ref 150–400)
RBC: 3.55 MIL/uL — ABNORMAL LOW (ref 3.87–5.11)
RDW: 12.9 % (ref 11.5–15.5)
WBC: 6 10*3/uL (ref 4.0–10.5)

## 2018-03-26 LAB — BASIC METABOLIC PANEL
Anion gap: 8 (ref 5–15)
BUN: 22 mg/dL (ref 8–23)
CO2: 21 mmol/L — ABNORMAL LOW (ref 22–32)
CREATININE: 1.67 mg/dL — AB (ref 0.44–1.00)
Calcium: 8.7 mg/dL — ABNORMAL LOW (ref 8.9–10.3)
Chloride: 104 mmol/L (ref 98–111)
GFR calc Af Amer: 32 mL/min — ABNORMAL LOW (ref >60)
GFR, EST NON AFRICAN AMERICAN: 27 mL/min — AB (ref >60)
GLUCOSE: 90 mg/dL (ref 70–99)
Potassium: 4.9 mmol/L (ref 3.5–5.1)
Sodium: 133 mmol/L — ABNORMAL LOW (ref 135–145)

## 2018-03-26 LAB — STREP PNEUMONIAE URINARY ANTIGEN: Strep Pneumo Urinary Antigen: NEGATIVE

## 2018-03-26 MED ORDER — VITAMIN D 1000 UNITS PO TABS
2000.0000 [IU] | ORAL_TABLET | Freq: Every day | ORAL | Status: DC
  Administered 2018-03-26 – 2018-03-28 (×3): 2000 [IU] via ORAL
  Filled 2018-03-26 (×3): qty 2

## 2018-03-26 MED ORDER — IPRATROPIUM-ALBUTEROL 0.5-2.5 (3) MG/3ML IN SOLN
3.0000 mL | Freq: Four times a day (QID) | RESPIRATORY_TRACT | Status: DC
  Filled 2018-03-26 (×2): qty 3

## 2018-03-26 MED ORDER — CLONAZEPAM 0.5 MG PO TABS
0.5000 mg | ORAL_TABLET | Freq: Two times a day (BID) | ORAL | Status: DC
  Administered 2018-03-26 – 2018-03-28 (×6): 0.5 mg via ORAL
  Filled 2018-03-26 (×6): qty 1

## 2018-03-26 MED ORDER — IPRATROPIUM-ALBUTEROL 0.5-2.5 (3) MG/3ML IN SOLN
3.0000 mL | RESPIRATORY_TRACT | Status: DC | PRN
  Administered 2018-03-27: 3 mL via RESPIRATORY_TRACT
  Filled 2018-03-26: qty 3

## 2018-03-26 MED ORDER — GUAIFENESIN-DM 100-10 MG/5ML PO SYRP: 5.0000 mL | ORAL_SOLUTION | ORAL | Status: DC | PRN

## 2018-03-26 MED ORDER — SODIUM CHLORIDE 0.9 % IV SOLN
INTRAVENOUS | Status: DC
  Administered 2018-03-26 (×2): via INTRAVENOUS

## 2018-03-26 MED ORDER — LATANOPROST 0.005 % OP SOLN
1.0000 [drp] | Freq: Every day | OPHTHALMIC | Status: DC
  Administered 2018-03-26 – 2018-03-27 (×2): 1 [drp] via OPHTHALMIC
  Filled 2018-03-26: qty 2.5

## 2018-03-26 MED ORDER — FERROUS SULFATE 325 (65 FE) MG PO TABS
325.0000 mg | ORAL_TABLET | Freq: Every day | ORAL | Status: DC
  Administered 2018-03-26 – 2018-03-28 (×3): 325 mg via ORAL
  Filled 2018-03-26 (×3): qty 1

## 2018-03-26 NOTE — Progress Notes (Signed)
PROGRESS NOTE  Melissa Mcdaniel  LEX:[id number] DOB: 1932/09/22 DOA: 03/25/2018 PCP: Harlan Stains, MD   Brief Narrative: Melissa Mcdaniel is an 84 y.o. female with a history of CAD, HTN, chronic HFrEF, chronic AFib, Crohn's disease and colon CA who presented to the hospital with malaise, fatigue, low blood pressure. She had taken ceftin as an outpatient for bronchitis as prescribed by her PCP, but only completed 1 dose due to diarrhea which has since resolved, though her po intake remains very poor. She appeared dehydrated, hypotensive, and CXR demonstrated right lower lobe pneumonia. Blood pressure improved with IV fluids. IV levaquin started and patient admitted.   Assessment & Plan: Active Problems:   Sepsis ([place])  Sepsis due to RLL CAP: PSI score puts her in class IV with 9.3% risk of mortality, largely due to comorbidities.  - Continue levaquin 76m IV x1, next dose (5063m due 9/5. Pt had PCN allergy and reaction to ceftin as outpatient.  - Monitor cultures, urine antigens  Chronic HFrEF: No exacerbation, remains slightly volume down and most recent echo showed improvement in EF. - Continue gentle IV fluids for now - Monitor I/O, renal function.  - Continue coreg. No ACE/ARB with renal failure. Holding lasix as below.   AKI on stage III CKD: Due to sepsis.  - Improving with IV fluids. Hold lasix, recheck in AM.  - Avoid nephrotoxins.   PAF: Currently in sinus rhythm with PVCs on monitoring.  - Continue amiodarone, coreg, eliquis  Depression:  - Continue home medications  DVT prophylaxis: Eliquis Code Status: Full Family Communication: Husband at bedside Disposition Plan: Home when improved, possibly 9/5 if taking po.  Consultants:   None  Procedures:   None  Antimicrobials:  Levaquin   Subjective: Still feels weak, no appetite. Cough has improved, no dyspnea. Husband says she looks more alert today, but not as chipper as her  baseline.  Objective: Vitals:   03/25/18 2057 03/25/18 2245 03/26/18 0542 03/26/18 0937  BP: (!) 193/81 (!) 183/97 (!) 176/79 135/60  Pulse: 77 75 92 81  Resp: 18 18 18 20   Temp: 98.1 F (36.7 C) 97.9 F (36.6 C) 98.7 F (37.1 C) 98.6 F (37 C)  TempSrc: Oral Oral Oral Oral  SpO2: 98% 99% 94% 100%  Weight:      Height:        Intake/Output Summary (Last 24 hours) at 03/26/2018 1530 Last data filed at 03/26/2018 1444 Gross per 24 hour  Intake 956.67 ml  Output 250 ml  Net 706.67 ml   Filed Weights   03/25/18 1418  Weight: 46.3 kg    Gen: Thin, elderly female in no distress  Pulm: Non-labored breathing. Decreased globally, worst at right lower zone. No wheezes or crackles.  CV: Regular rate and rhythm. No murmur, rub, or gallop. No JVD, no pedal edema. GI: Abdomen soft, non-tender, non-distended, with normoactive bowel sounds. No organomegaly or masses felt. Ext: Warm, no deformities Skin: No rashes, lesions no ulcers Neuro: Alert and oriented. No focal neurological deficits. Psych: Judgement and insight appear normal. Mood & affect appropriate.   Data Reviewed: I have personally reviewed following labs and imaging studies  CBC: Recent Labs  Lab 03/25/18 1437 03/26/18 0618  WBC 7.0 6.0  HGB 8.9* 9.5*  HCT 30.3* 31.6*  MCV 90.2 89.0  PLT 246 20449 Basic Metabolic Panel: Recent Labs  Lab 03/25/18 1437 03/26/18 0618  NA 133* 133*  K 4.7 4.9  CL 102 104  CO2 22 21*  GLUCOSE 123* 90  BUN 26* 22  CREATININE 2.07* 1.67*  CALCIUM 8.5* 8.7*   GFR: Estimated Creatinine Clearance: 18.3 mL/min (A) (by C-G formula based on SCr of 1.67 mg/dL (H)). Liver Function Tests: No results for input(s): AST, ALT, ALKPHOS, BILITOT, PROT, ALBUMIN in the last 168 hours. No results for input(s): LIPASE, AMYLASE in the last 168 hours. No results for input(s): AMMONIA in the last 168 hours. Coagulation Profile: No results for input(s): INR, PROTIME in the last 168  hours. Cardiac Enzymes: No results for input(s): CKTOTAL, CKMB, CKMBINDEX, TROPONINI in the last 168 hours. BNP (last 3 results) Recent Labs    07/04/17 1514 08/07/17 1036 08/20/17 1119  PROBNP 4,095* 2,903* 3,103*   HbA1C: No results for input(s): HGBA1C in the last 72 hours. CBG: No results for input(s): GLUCAP in the last 168 hours. Lipid Profile: No results for input(s): CHOL, HDL, LDLCALC, TRIG, CHOLHDL, LDLDIRECT in the last 72 hours. Thyroid Function Tests: No results for input(s): TSH, T4TOTAL, FREET4, T3FREE, THYROIDAB in the last 72 hours. Anemia Panel: No results for input(s): VITAMINB12, FOLATE, FERRITIN, TIBC, IRON, RETICCTPCT in the last 72 hours. Urine analysis:    Component Value Date/Time   COLORURINE YELLOW 03/25/2018 1835   APPEARANCEUR HAZY (A) 03/25/2018 1835   LABSPEC 1.009 03/25/2018 1835   PHURINE 5.0 03/25/2018 1835   GLUCOSEU NEGATIVE 03/25/2018 1835   HGBUR NEGATIVE 03/25/2018 1835   BILIRUBINUR NEGATIVE 03/25/2018 1835   KETONESUR NEGATIVE 03/25/2018 1835   PROTEINUR NEGATIVE 03/25/2018 1835   NITRITE NEGATIVE 03/25/2018 1835   LEUKOCYTESUR SMALL (A) 03/25/2018 1835   Recent Results (from the past 240 hour(s))  Blood Culture (routine x 2)     Status: None (Preliminary result)   Collection Time: 03/25/18  6:06 PM  Result Value Ref Range Status   Specimen Description BLOOD RIGHT ANTECUBITAL  Final   Special Requests   Final    BOTTLES DRAWN AEROBIC AND ANAEROBIC Blood Culture adequate volume   Culture   Final    NO GROWTH < 24 HOURS Performed at [facility], [place] [address] [place], [place] [address]    Report Status PENDING  Incomplete  Blood Culture (routine x 2)     Status: None (Preliminary result)   Collection Time: 03/25/18  6:10 PM  Result Value Ref Range Status   Specimen Description BLOOD LEFT WRIST  Final   Special Requests   Final    BOTTLES DRAWN AEROBIC AND ANAEROBIC Blood Culture results may not be optimal due to an  inadequate volume of blood received in culture bottles   Culture   Final    NO GROWTH < 24 HOURS Performed at [facility], [place] [address], [place], [place] [address]    Report Status PENDING  Incomplete      Radiology Studies: Dg Chest 2 View  Result Date: 03/25/2018 CLINICAL DATA:  Weakness. EXAM: CHEST - 2 VIEW COMPARISON:  Radiographs of February 13, 2018. FINDINGS: The heart size and mediastinal contours are within normal limits. Atherosclerosis of thoracic aorta is noted. No pneumothorax or significant pleural effusion is noted. Left lung is clear. Right middle lobe airspace opacity is noted consistent with pneumonia. The visualized skeletal structures are unremarkable. IMPRESSION: Right middle lobe pneumonia. Followup PA and lateral chest X-ray is recommended in 3-4 weeks following trial of antibiotic therapy to ensure resolution and exclude underlying malignancy. Aortic Atherosclerosis (ICD10-I70.0). Electronically Signed   By: Marijo Conception, M.D.   On: 03/25/2018  16:29    Scheduled Meds: . amiodarone  200 mg Oral Daily  . apixaban  2.5 mg Oral BID  . carvedilol  3.125 mg Oral BID  . cholecalciferol  2,000 Units Oral Daily  . clonazePAM  0.5 mg Oral BID  . famotidine  20 mg Oral Daily  . ferrous sulfate  325 mg Oral Daily  . latanoprost  1 drop Both Eyes QHS  . mirtazapine  15 mg Oral QHS  . venlafaxine XR  150 mg Oral BID   Continuous Infusions: . sodium chloride 75 mL/hr at 03/26/18 1330  . [START ON 03/27/2018] levofloxacin (LEVAQUIN) IV       LOS: 1 day   Time spent: 25 minutes.  Patrecia Pour, MD [facility] [email or web address] Password [email or web address] 03/26/2018, 3:30 PM

## 2018-03-26 NOTE — Progress Notes (Signed)
New Admission Note:  Arrival Method: Via stretcher from ED Mental Orientation: Alert & Oriented x4 Telemetry: CCMD verified.  Assessment: Completed Skin: Refer to flowsheet IV: Left Hand  Pain: 0/10 Tubes: Safety Measures: Safety Fall Prevention Plan discussed with patient. Admission: Completed [facility] [place] Orientation: Patient has been orientated to the room, unit and the staff. Family: Husband at the bedside.   Orders have been reviewed and are being implemented. Will continue to monitor the patient. Call light has been placed within reach and bed alarm has been activated.   Vassie Moselle, RN  Phone Number: [phone]

## 2018-03-27 LAB — LEGIONELLA PNEUMOPHILA SEROGP 1 UR AG: L. pneumophila Serogp 1 Ur Ag: NEGATIVE

## 2018-03-27 LAB — BASIC METABOLIC PANEL
Anion gap: 8 (ref 5–15)
BUN: 16 mg/dL (ref 8–23)
CALCIUM: 7.8 mg/dL — AB (ref 8.9–10.3)
CHLORIDE: 107 mmol/L (ref 98–111)
CO2: 20 mmol/L — ABNORMAL LOW (ref 22–32)
Creatinine, Ser: 1.49 mg/dL — ABNORMAL HIGH (ref 0.44–1.00)
GFR calc non Af Amer: 31 mL/min — ABNORMAL LOW (ref >60)
GFR, EST AFRICAN AMERICAN: 36 mL/min — AB (ref >60)
Glucose, Bld: 104 mg/dL — ABNORMAL HIGH (ref 70–99)
Potassium: 4.3 mmol/L (ref 3.5–5.1)
SODIUM: 135 mmol/L (ref 135–145)

## 2018-03-27 MED ORDER — HYDRALAZINE HCL 25 MG PO TABS
25.0000 mg | ORAL_TABLET | Freq: Two times a day (BID) | ORAL | Status: DC
  Administered 2018-03-28: 25 mg via ORAL
  Filled 2018-03-27 (×2): qty 1

## 2018-03-27 MED ORDER — ENSURE ENLIVE PO LIQD: 237.0000 mL | Freq: Two times a day (BID) | ORAL | Status: DC

## 2018-03-27 NOTE — Evaluation (Signed)
Physical Therapy Evaluation Patient Details Name: Melissa Mcdaniel MRN: [id number] DOB: 1932-11-11 Today's Date: 03/27/2018   History of Present Illness  Pt is a 84 y.o. f with significant PMH of CAD, HTN, chronic HFrEF, chronic Afib, Crohn's disease, hip fx, colon CA who presents to hospital with malaise, fatigue, low blood pressure. CXR shows right lower lobe pneumonia.   Clinical Impression  Patient evaluated by Physical Therapy with no further acute PT needs identified. Patient ambulating hallway distances without an assistive device with supervision. SpO2 85% on RA during mobility but increased to 90% SpO2 with standing rest break and pursed lip breathing instructions. Reviewed endurance conservation strategies and incentive spirometer use. All education has been completed and the patient has no further questions. No follow-up Physical Therapy or equipment needs. PT is signing off. Thank you for this referral.     Follow Up Recommendations No PT follow up    Equipment Recommendations  None recommended by PT    Recommendations for Other Services       Precautions / Restrictions Precautions Precautions: Fall Precaution Comments: watch O2 Restrictions Weight Bearing Restrictions: No      Mobility  Bed Mobility Overal bed mobility: Independent                Transfers Overall transfer level: Independent                  Ambulation/Gait Ambulation/Gait assistance: Supervision Gait Distance (Feet): 350 Feet Assistive device: None Gait Pattern/deviations: Step-through pattern;Decreased stride length;Narrow base of support     General Gait Details: Patient with kyphotic posture throughout gait and adequate gait speed, no overt LOB  Stairs            Wheelchair Mobility    Modified Rankin (Stroke Patients Only)       Balance Overall balance assessment: Mild deficits observed, not formally tested                                            Pertinent Vitals/Pain Pain Assessment: No/denies pain    Home Living Family/patient expects to be discharged to:: Private residence Living Arrangements: Spouse/significant other Available Help at Discharge: Family;Available 24 hours/day Type of Home: House Home Access: Stairs to enter Entrance Stairs-Rails: Psychiatric nurse of Steps: 2 Home Layout: Two level Home Equipment: None      Prior Function Level of Independence: Independent               Hand Dominance        Extremity/Trunk Assessment   Upper Extremity Assessment Upper Extremity Assessment: Overall WFL for tasks assessed    Lower Extremity Assessment Lower Extremity Assessment: Overall WFL for tasks assessed    Cervical / Trunk Assessment Cervical / Trunk Assessment: Kyphotic  Communication   Communication: No difficulties  Cognition Arousal/Alertness: Awake/alert Behavior During Therapy: WFL for tasks assessed/performed Overall Cognitive Status: Within Functional Limits for tasks assessed                                        General Comments      Exercises     Assessment/Plan    PT Assessment Patent does not need any further PT services  PT Problem List         PT Treatment  Interventions      PT Goals (Current goals can be found in the Care Plan section)  Acute Rehab PT Goals Patient Stated Goal: go home PT Goal Formulation: All assessment and education complete, DC therapy    Frequency     Barriers to discharge        Co-evaluation               AM-PAC PT "6 Clicks" Daily Activity  Outcome Measure Difficulty turning over in bed (including adjusting bedclothes, sheets and blankets)?: None Difficulty moving from lying on back to sitting on the side of the bed? : None Difficulty sitting down on and standing up from a chair with arms (e.g., wheelchair, bedside commode, etc,.)?: None Help needed moving to and from a bed to  chair (including a wheelchair)?: None Help needed walking in hospital room?: A Little Help needed climbing 3-5 steps with a railing? : A Little 6 Click Score: 22    End of Session Equipment Utilized During Treatment: Gait belt Activity Tolerance: Patient tolerated treatment well Patient left: in bed;with call bell/phone within reach;with family/visitor present Nurse Communication: Mobility status PT Visit Diagnosis: Difficulty in walking, not elsewhere classified (R26.2);Muscle weakness (generalized) (M62.81)    Time: 1292-9090 PT Time Calculation (min) (ACUTE ONLY): 21 min   Charges:   PT Evaluation $PT Eval Moderate Complexity: 1 Mod          Ellamae Sia, PT, DPT Acute Rehabilitation Services  Pager: [phone]  Willy Eddy 03/27/2018, 5:06 PM

## 2018-03-27 NOTE — Progress Notes (Signed)
PROGRESS NOTE  Melissa Mcdaniel  TXM:[id number] DOB: 11-05-1932 DOA: 03/25/2018 PCP: Harlan Stains, MD   Brief Narrative: Melissa Mcdaniel is an 84 y.o. female with a history of CAD, HTN, chronic HFrEF, chronic AFib, Crohn's disease and colon CA who presented to the hospital with malaise, fatigue, low blood pressure. She had taken ceftin as an outpatient for bronchitis as prescribed by her PCP, but only completed 1 dose due to diarrhea which has since resolved, though her po intake remains very poor. She appeared dehydrated, hypotensive, and CXR demonstrated right lower lobe pneumonia. Blood pressure improved with IV fluids. IV levaquin started and patient admitted.   Assessment & Plan: Active Problems:   Sepsis ([place])  Sepsis due to RLL CAP: PSI score puts her in class IV with 9.3% risk of mortality, largely due to comorbidities.  - Continue levaquin 551m q48h. Pt had PCN allergy and reaction to ceftin as outpatient.  - Monitor blood cultures (NG2D)   Chronic HFrEF: No exacerbation, remains slightly volume down and most recent echo showed improvement in EF. - Monitor I/O, renal function.  - Continue coreg. No ACE/ARB with renal failure. Holding lasix as below.   AKI on stage III CKD: Due to sepsis.  - Improving with IV fluids. Will stop fluids if po intake continues to improve today and continue holding lasix. Recheck BMP in AM. - Avoid nephrotoxins.   PAF: Currently in sinus rhythm with PVCs on monitoring.  - Continue amiodarone, coreg, eliquis - DC telemetry monitoring  Depression:  - Continue home medications  DVT prophylaxis: Eliquis Code Status: Full Family Communication: None at bedside Disposition Plan: Home when improved, most likely 9/6. PT eval ordered.  Consultants:   None  Procedures:   None  Antimicrobials:  Levaquin   Subjective: Ordered a full breakfast today and says her appetite is returning. Hasn't actually EATEN much of it yet though. Has not  been up and walking much, feels weak. Had fever this morning. Says breathing treatment helped dyspnea.  Objective: Vitals:   03/26/18 2103 03/27/18 0524 03/27/18 0653 03/27/18 0749  BP: (!) 155/59 (!) 179/61 (!) 137/55 (!) 134/55  Pulse: 82 94 93 89  Resp: 16 18 (!) 21 16  Temp: 98.9 F (37.2 C) (!) 101.6 F (38.7 C) 98.4 F (36.9 C) 98.8 F (37.1 C)  TempSrc: Oral Oral Oral Oral  SpO2: 94% 95% 92% 91%  Weight:      Height:        Intake/Output Summary (Last 24 hours) at 03/27/2018 1435 Last data filed at 03/27/2018 1344 Gross per 24 hour  Intake 3003.73 ml  Output 400 ml  Net 2603.73 ml   Filed Weights   03/25/18 1418  Weight: 46.3 kg   Gen: Elderly, pleasant female in no distress Pulm: Nonlabored breathing room air. Diminished. CV: Regular rate and rhythm. No murmur, rub, or gallop. No JVD, trace dependent edema. GI: Abdomen soft, non-tender, non-distended, with normoactive bowel sounds.  Ext: Warm, no deformities Skin: No rashes, lesions or ulcers on visualized skin.  Neuro: Alert and oriented. No focal neurological deficits. Psych: Judgement and insight appear fair. Mood euthymic & affect congruent. Behavior is appropriate.     Data Reviewed: I have personally reviewed following labs and imaging studies  CBC: Recent Labs  Lab 03/25/18 1437 03/26/18 0618  WBC 7.0 6.0  HGB 8.9* 9.5*  HCT 30.3* 31.6*  MCV 90.2 89.0  PLT 246 2482  Basic Metabolic Panel: Recent Labs  Lab 03/25/18 1437 03/26/18 0618  03/27/18 0353  NA 133* 133* 135  K 4.7 4.9 4.3  CL 102 104 107  CO2 22 21* 20*  GLUCOSE 123* 90 104*  BUN 26* 22 16  CREATININE 2.07* 1.67* 1.49*  CALCIUM 8.5* 8.7* 7.8*   GFR: Estimated Creatinine Clearance: 20.5 mL/min (A) (by C-G formula based on SCr of 1.49 mg/dL (H)). Liver Function Tests: No results for input(s): AST, ALT, ALKPHOS, BILITOT, PROT, ALBUMIN in the last 168 hours. No results for input(s): LIPASE, AMYLASE in the last 168 hours. No results  for input(s): AMMONIA in the last 168 hours. Coagulation Profile: No results for input(s): INR, PROTIME in the last 168 hours. Cardiac Enzymes: No results for input(s): CKTOTAL, CKMB, CKMBINDEX, TROPONINI in the last 168 hours. BNP (last 3 results) Recent Labs    07/04/17 1514 08/07/17 1036 08/20/17 1119  PROBNP 4,095* 2,903* 3,103*   HbA1C: No results for input(s): HGBA1C in the last 72 hours. CBG: No results for input(s): GLUCAP in the last 168 hours. Lipid Profile: No results for input(s): CHOL, HDL, LDLCALC, TRIG, CHOLHDL, LDLDIRECT in the last 72 hours. Thyroid Function Tests: No results for input(s): TSH, T4TOTAL, FREET4, T3FREE, THYROIDAB in the last 72 hours. Anemia Panel: No results for input(s): VITAMINB12, FOLATE, FERRITIN, TIBC, IRON, RETICCTPCT in the last 72 hours. Urine analysis:    Component Value Date/Time   COLORURINE YELLOW 03/25/2018 1835   APPEARANCEUR HAZY (A) 03/25/2018 1835   LABSPEC 1.009 03/25/2018 1835   PHURINE 5.0 03/25/2018 1835   GLUCOSEU NEGATIVE 03/25/2018 1835   HGBUR NEGATIVE 03/25/2018 1835   BILIRUBINUR NEGATIVE 03/25/2018 1835   KETONESUR NEGATIVE 03/25/2018 1835   PROTEINUR NEGATIVE 03/25/2018 1835   NITRITE NEGATIVE 03/25/2018 1835   LEUKOCYTESUR SMALL (A) 03/25/2018 1835   Recent Results (from the past 240 hour(s))  Blood Culture (routine x 2)     Status: None (Preliminary result)   Collection Time: 03/25/18  6:06 PM  Result Value Ref Range Status   Specimen Description BLOOD RIGHT ANTECUBITAL  Final   Special Requests   Final    BOTTLES DRAWN AEROBIC AND ANAEROBIC Blood Culture adequate volume   Culture   Final    NO GROWTH 2 DAYS Performed at [facility], [place] [address], [place], [place] [address]    Report Status PENDING  Incomplete  Blood Culture (routine x 2)     Status: None (Preliminary result)   Collection Time: 03/25/18  6:10 PM  Result Value Ref Range Status   Specimen Description BLOOD LEFT WRIST  Final    Special Requests   Final    BOTTLES DRAWN AEROBIC AND ANAEROBIC Blood Culture results may not be optimal due to an inadequate volume of blood received in culture bottles   Culture   Final    NO GROWTH 2 DAYS Performed at [facility], [place] [address], [place], [place] [address]    Report Status PENDING  Incomplete      Radiology Studies: Dg Chest 2 View  Result Date: 03/25/2018 CLINICAL DATA:  Weakness. EXAM: CHEST - 2 VIEW COMPARISON:  Radiographs of February 13, 2018. FINDINGS: The heart size and mediastinal contours are within normal limits. Atherosclerosis of thoracic aorta is noted. No pneumothorax or significant pleural effusion is noted. Left lung is clear. Right middle lobe airspace opacity is noted consistent with pneumonia. The visualized skeletal structures are unremarkable. IMPRESSION: Right middle lobe pneumonia. Followup PA and lateral chest X-ray is recommended in 3-4 weeks following trial of antibiotic  therapy to ensure resolution and exclude underlying malignancy. Aortic Atherosclerosis (ICD10-I70.0). Electronically Signed   By: Marijo Conception, M.D.   On: 03/25/2018 16:29    Scheduled Meds: . amiodarone  200 mg Oral Daily  . apixaban  2.5 mg Oral BID  . carvedilol  3.125 mg Oral BID  . cholecalciferol  2,000 Units Oral Daily  . clonazePAM  0.5 mg Oral BID  . famotidine  20 mg Oral Daily  . ferrous sulfate  325 mg Oral Daily  . latanoprost  1 drop Both Eyes QHS  . mirtazapine  15 mg Oral QHS  . venlafaxine XR  150 mg Oral BID   Continuous Infusions: . levofloxacin (LEVAQUIN) IV       LOS: 2 days   Time spent: 25 minutes.  Patrecia Pour, MD [facility] [email or web address] Password [email or web address] 03/27/2018, 2:35 PM

## 2018-03-27 NOTE — Progress Notes (Signed)
Initial Nutrition Assessment  DOCUMENTATION CODES:      INTERVENTION:   Ensure Enlive po BID, each supplement provides 350 kcal and 20 grams of protein   NUTRITION DIAGNOSIS:   Inadequate oral intake related to acute illness as evidenced by per patient/family report.  GOAL:   Patient will meet greater than or equal to 90% of their needs  MONITOR:   PO intake, Supplement acceptance, Labs, Weight trends  REASON FOR ASSESSMENT:   Malnutrition Screening Tool    ASSESSMENT:    84 yo female admitted with sepsis due to RLL CAP, AKI on CKD III. Pt with complaints of weakness/malaise, no appetite on admission. PMH includes CAD, HTN, chronic CHF, chronic Afib, Crohn's Disease and colon cancer  Recorded po intake 75% at breakfast this AM. Pt ate 25% at dinner last night.  Pt reports appetite improving. Pt indicates poor appetite for 4 days PTA. Pt indicates not eating anything, drinking minimally. Pt reports prior to this, eating well, 3 meals per day, no snacks, no supplements  No significant weight changes per last 10 weight encounters reaching back to March 2018. Pt indicates 4 pound wt loss in 4 days. Reports UBW around 106 pounds, currently 101 pounds.   Labs: Creatinine 1.49, BUN wdl Meds: reviewed  Diet Order:   Diet Order            Diet Heart Room service appropriate? Yes; Fluid consistency: Thin  Diet effective now              EDUCATION NEEDS:   No education needs have been identified at this time  Skin:  Skin Assessment: Reviewed RN Assessment  Last BM:  9/3  Height:   Ht Readings from Last 1 Encounters:  03/25/18 5' (1.524 m)    Weight:   Wt Readings from Last 1 Encounters:  03/25/18 46.3 kg    Ideal Body Weight:     BMI:  Body mass index is 19.92 kg/m.  Estimated Nutritional Needs:   Kcal:  1400-1600 kcals   Protein:  70-80 g of protein  Fluid:  >/= 1.5 L   Kerman Passey MS, RD, LDN, CNSC [phone] Pager  [phone]  Weekend/On-Call Pager

## 2018-03-28 LAB — BASIC METABOLIC PANEL
Anion gap: 7 (ref 5–15)
BUN: 12 mg/dL (ref 8–23)
CHLORIDE: 107 mmol/L (ref 98–111)
CO2: 21 mmol/L — ABNORMAL LOW (ref 22–32)
Calcium: 7.9 mg/dL — ABNORMAL LOW (ref 8.9–10.3)
Creatinine, Ser: 1.41 mg/dL — ABNORMAL HIGH (ref 0.44–1.00)
GFR calc Af Amer: 39 mL/min — ABNORMAL LOW (ref >60)
GFR calc non Af Amer: 33 mL/min — ABNORMAL LOW (ref >60)
Glucose, Bld: 87 mg/dL (ref 70–99)
POTASSIUM: 4.4 mmol/L (ref 3.5–5.1)
SODIUM: 135 mmol/L (ref 135–145)

## 2018-03-28 MED ORDER — LEVOFLOXACIN 500 MG PO TABS: 500.0000 mg | ORAL_TABLET | ORAL | 0 refills | Status: DC

## 2018-03-28 MED ORDER — ALBUTEROL SULFATE 1.25 MG/3ML IN NEBU: 1.0000 | INHALATION_SOLUTION | Freq: Four times a day (QID) | RESPIRATORY_TRACT | 0 refills | Status: DC | PRN

## 2018-03-28 NOTE — Progress Notes (Signed)
Patient discharged to home with husband. After visit Summary reviewed. Patient capable of reverbalizing medications and follow up visits. No signs and symptoms of distress noted. Patient educated to return to the ED in the case of an emergency. Dillon Bjork RN

## 2018-03-28 NOTE — Discharge Instructions (Signed)

## 2018-03-28 NOTE — Discharge Summary (Signed)
Physician Discharge Summary  Melissa Mcdaniel:[id number] DOB: August 16, 1932 DOA: 03/25/2018  PCP: Harlan Stains, MD  Admit date: 03/25/2018 Discharge date: 03/28/2018  Admitted From: Home Disposition: Home   Recommendations for Outpatient Follow-up:  1. Follow up with PCP next week as scheduled. Consider qualification for ambulatory oxygen and pulmonology referral for formal PFTs. 2. Follow up CXR suggested after completion of therapy to ensure resolution of infiltrate. 3. Please obtain BMP/CBC in one week  Home Health: None Equipment/Devices: Nebulizer, declined oxygen Discharge Condition: Stable CODE STATUS: Full Diet recommendation: Heart healthy  Brief/Interim Summary: Melissa Mcdaniel is an 84 y.o. female with a history of CAD, HTN, chronic HFrEF, chronic AFib, Crohn's disease and colon CA who presented to the hospital with malaise, fatigue, low blood pressure. She had taken ceftin as an outpatient for bronchitis as prescribed by her PCP, but only completed 1 dose due to diarrhea which has since resolved, though her po intake remains very poor. She appeared dehydrated, hypotensive, and CXR demonstrated right middle lobe pneumonia. Blood pressure improved with IV fluids. IV levaquin started and patient admitted. Creatinine improved and blood pressure has become normo-to-hypertensive with IV fluids. She has been able to walk in the hallways and appetite picked up a bit. Nebulizer therapy was provided with additional improvement in respiratory status so this will be continued at home.  Discharge Diagnoses:  Active Problems:   Sepsis ([place])  Sepsis due to RML CAP: PSI score puts her in class IV with 9.3% risk of mortality, largely due to comorbidities.  - Continue levaquin 513m q48h. Pt had PCN allergy and reaction to ceftin as outpatient.  - Monitor blood cultures (no growth at discharge)  Chronic HFrEF: No exacerbation, remains slightly volume down and most recent echo showed  improvement in EF. - Monitor renal function.  - Continue coreg, hydralazine. - Has cardiology follow up with Dr. MJulianne Handler9/25.  AKI on stage III CKD: Due to sepsis.  - Improved with IV fluids and continued improvement after stopping IVF. - Recheck BMP at follow up. - Avoid nephrotoxins.   PAF: Currently in sinus rhythm with PVCs on monitoring.  - Continue amiodarone, coreg, eliquis  Depression:  - Continue home medications  Discharge Instructions Discharge Instructions    DME Nebulizer machine   Complete by:  As directed    Patient needs a nebulizer to treat with the following condition:  COPD (chronic obstructive pulmonary disease) (HCC)   Diet - low sodium heart healthy   Complete by:  As directed    Discharge instructions   Complete by:  As directed    - Continue antibiotics by taking levaquin: take a dose tomorrow morning and the 2nd dose Monday morning.  - Use the nebulizer machine to give albuterol as needed.  - Continue taking medications as you were. If your blood pressure goes low, hold the hydralazine. If it remains low, call your doctor. If it is low and you have changes in mental status/confusion, seek medical attention right away.  - It's critical that you follow up with your primary doctor next week.   Increase activity slowly   Complete by:  As directed      Allergies as of 03/28/2018      Reactions   Penicillins Swelling   Has patient had a PCN reaction causing immediate rash, facial/tongue/throat swelling, SOB or lightheadedness with hypotension: Yes Has patient had a PCN reaction causing severe rash involving mucus membranes or skin necrosis: No Has patient had a PCN reaction that  required hospitalization: No Has patient had a PCN reaction occurring within the last 10 years: No If all of the above answers are "NO", then may proceed with Cephalosporin use.      Medication List    STOP taking these medications   traMADol 50 MG tablet Commonly known  as:  ULTRAM     TAKE these medications   albuterol 1.25 MG/3ML nebulizer solution Commonly known as:  ACCUNEB Take 3 mLs (1.25 mg total) by nebulization every 6 (six) hours as needed for wheezing or shortness of breath.   alendronate 70 MG tablet Commonly known as:  FOSAMAX Take 70 mg by mouth once a week. Take with a full glass of water on an empty stomach. Saturdays   amiodarone 200 MG tablet Commonly known as:  PACERONE TAKE 1 TABLET DAILY   apixaban 2.5 MG Tabs tablet Commonly known as:  ELIQUIS Take 1 tablet (2.5 mg total) by mouth 2 (two) times daily.   carvedilol 3.125 MG tablet Commonly known as:  COREG Take 1 tablet (3.125 mg total) by mouth 2 (two) times daily.   clonazePAM 0.5 MG tablet Commonly known as:  KLONOPIN TK 1 T PO BID What changed:    how much to take  how to take this  when to take this  additional instructions   furosemide 20 MG tablet Commonly known as:  LASIX Take 1 tablet daily only as needed for weight gain What changed:    how much to take  how to take this  when to take this  reasons to take this  additional instructions   hydrALAZINE 25 MG tablet Commonly known as:  APRESOLINE Take 25 mg by mouth 2 (two) times daily.   Iron 28 MG Tabs Take 1 tablet by mouth daily.   latanoprost 0.005 % ophthalmic solution Commonly known as:  XALATAN Place 1 drop into both eyes at bedtime.   levofloxacin 500 MG tablet Commonly known as:  LEVAQUIN Take 1 tablet (500 mg total) by mouth every other day for 4 days. Start taking on:  03/29/2018   loperamide 2 MG capsule Commonly known as:  IMODIUM Take 4 mg by mouth 4 (four) times daily as needed for diarrhea or loose stools.   mirtazapine 15 MG tablet Commonly known as:  REMERON Take 15 mg by mouth at bedtime.   potassium chloride 10 MEQ tablet Commonly known as:  K-DUR Take 1 tablet only when you take a Lasix. What changed:    how much to take  how to take this  when to take  this  reasons to take this  additional instructions   ranitidine 150 MG tablet Commonly known as:  ZANTAC Take 150 mg by mouth daily as needed for heartburn.   venlafaxine XR 150 MG 24 hr capsule Commonly known as:  EFFEXOR-XR Take 150 mg by mouth 2 (two) times daily.   Vitamin D3 2000 units Tabs Take 2,000 Units by mouth daily.            Durable Medical Equipment  (From admission, onward)         Start     Ordered   03/28/18 0000  DME Nebulizer machine    Question:  Patient needs a nebulizer to treat with the following condition  Answer:  COPD (chronic obstructive pulmonary disease) ([place])   03/28/18 1123         Follow-up Information    Harlan Stains, MD Follow up on 04/03/2018.   Specialty:  Family  Medicine Contact information: [place] [address] [phone]          Allergies  Allergen Reactions  . Penicillins Swelling    Has patient had a PCN reaction causing immediate rash, facial/tongue/throat swelling, SOB or lightheadedness with hypotension: Yes Has patient had a PCN reaction causing severe rash involving mucus membranes or skin necrosis: No Has patient had a PCN reaction that required hospitalization: No Has patient had a PCN reaction occurring within the last 10 years: No If all of the above answers are "NO", then may proceed with Cephalosporin use.     Consultations:  None  Procedures/Studies: Dg Chest 2 View  Result Date: 03/25/2018 CLINICAL DATA:  Weakness. EXAM: CHEST - 2 VIEW COMPARISON:  Radiographs of February 13, 2018. FINDINGS: The heart size and mediastinal contours are within normal limits. Atherosclerosis of thoracic aorta is noted. No pneumothorax or significant pleural effusion is noted. Left lung is clear. Right middle lobe airspace opacity is noted consistent with pneumonia. The visualized skeletal structures are unremarkable. IMPRESSION: Right middle lobe pneumonia. Followup PA and lateral chest  X-ray is recommended in 3-4 weeks following trial of antibiotic therapy to ensure resolution and exclude underlying malignancy. Aortic Atherosclerosis (ICD10-I70.0). Electronically Signed   By: Marijo Conception, M.D.   On: 03/25/2018 16:29    Subjective: Walking in the halls, better exercise tolerance than PTA. Breathing treatments continue to help a lot, wants nebulizer at home. No chest pain, cough, fever.   Discharge Exam: Vitals:   03/28/18 0458 03/28/18 0925  BP: 139/67 (!) 125/97  Pulse: 89 98  Resp: 18 20  Temp: 99.5 F (37.5 C) 98.9 F (37.2 C)  SpO2: 91% 96%   General: Pt is alert, awake, not in acute distress Cardiovascular: RRR, S1/S2 +, no rubs, no gallops Respiratory: CTA bilaterally, no wheezing, no rhonchi Abdominal: Soft, NT, ND, bowel sounds + Extremities: No edema, no cyanosis  Labs: BNP (last 3 results) No results for input(s): BNP in the last 8760 hours. Basic Metabolic Panel: Recent Labs  Lab 03/25/18 1437 03/26/18 0618 03/27/18 0353 03/28/18 0635  NA 133* 133* 135 135  K 4.7 4.9 4.3 4.4  CL 102 104 107 107  CO2 22 21* 20* 21*  GLUCOSE 123* 90 104* 87  BUN 26* 22 16 12   CREATININE 2.07* 1.67* 1.49* 1.41*  CALCIUM 8.5* 8.7* 7.8* 7.9*   Liver Function Tests: No results for input(s): AST, ALT, ALKPHOS, BILITOT, PROT, ALBUMIN in the last 168 hours. No results for input(s): LIPASE, AMYLASE in the last 168 hours. No results for input(s): AMMONIA in the last 168 hours. CBC: Recent Labs  Lab 03/25/18 1437 03/26/18 0618  WBC 7.0 6.0  HGB 8.9* 9.5*  HCT 30.3* 31.6*  MCV 90.2 89.0  PLT 246 200   Cardiac Enzymes: No results for input(s): CKTOTAL, CKMB, CKMBINDEX, TROPONINI in the last 168 hours. BNP: Invalid input(s): POCBNP CBG: No results for input(s): GLUCAP in the last 168 hours. D-Dimer No results for input(s): DDIMER in the last 72 hours. Hgb A1c No results for input(s): HGBA1C in the last 72 hours. Lipid Profile No results for  input(s): CHOL, HDL, LDLCALC, TRIG, CHOLHDL, LDLDIRECT in the last 72 hours. Thyroid function studies No results for input(s): TSH, T4TOTAL, T3FREE, THYROIDAB in the last 72 hours.  Invalid input(s): FREET3 Anemia work up No results for input(s): VITAMINB12, FOLATE, FERRITIN, TIBC, IRON, RETICCTPCT in the last 72 hours. Urinalysis    Component Value Date/Time  COLORURINE YELLOW 03/25/2018 1835   APPEARANCEUR HAZY (A) 03/25/2018 1835   LABSPEC 1.009 03/25/2018 1835   PHURINE 5.0 03/25/2018 1835   GLUCOSEU NEGATIVE 03/25/2018 [place] NEGATIVE 03/25/2018 [place] 03/25/2018 [place] 03/25/2018 [place] 03/25/2018 1835   NITRITE NEGATIVE 03/25/2018 [place] (A) 03/25/2018 1835    Microbiology Recent Results (from the past 240 hour(s))  Blood Culture (routine x 2)     Status: None (Preliminary result)   Collection Time: 03/25/18  6:06 PM  Result Value Ref Range Status   Specimen Description BLOOD RIGHT ANTECUBITAL  Final   Special Requests   Final    BOTTLES DRAWN AEROBIC AND ANAEROBIC Blood Culture adequate volume   Culture   Final    NO GROWTH 2 DAYS Performed at [facility], [place] [address] [place], [place] [address]    Report Status PENDING  Incomplete  Blood Culture (routine x 2)     Status: None (Preliminary result)   Collection Time: 03/25/18  6:10 PM  Result Value Ref Range Status   Specimen Description BLOOD LEFT WRIST  Final   Special Requests   Final    BOTTLES DRAWN AEROBIC AND ANAEROBIC Blood Culture results may not be optimal due to an inadequate volume of blood received in culture bottles   Culture   Final    NO GROWTH 2 DAYS Performed at [facility], [place] [address] [place], [place] [address]    Report Status PENDING  Incomplete    Time coordinating discharge: Approximately 40 minutes  Patrecia Pour, MD  [facility] 03/28/2018, 11:23 AM Pager [phone]

## 2018-03-28 NOTE — Progress Notes (Signed)
Post discharge call received from RN on 5MW regarding nebulizer need- pt has already discharged home- order was placed for home nebulizer. Call made to pt's husband at home, discussed need for nebulizer. Gave husband option to go pick machine up at [facility] store on [address] or have machine shipped to home. Husband states he would like to pick machine up at [facility] store. Address and phone # provided to husband for [facility] store on [address]. Call made to [facility] with [facility] regarding nebulizer- order will be sent to store for processing to have ready for husband to pick up this afternoon before 5 pm.

## 2018-03-29 ENCOUNTER — Other Ambulatory Visit: Payer: Self-pay

## 2018-03-29 ENCOUNTER — Inpatient Hospital Stay (HOSPITAL_COMMUNITY)
Admission: EM | Admit: 2018-03-29 | Discharge: 2018-04-01 | DRG: 291 | Disposition: A | Discharge status: Home / Self Care | Payer: Medicare Other | Attending: Family Medicine | Admitting: Family Medicine

## 2018-03-29 ENCOUNTER — Encounter (HOSPITAL_COMMUNITY): Payer: Self-pay | Admitting: Emergency Medicine

## 2018-03-29 ENCOUNTER — Emergency Department (HOSPITAL_COMMUNITY): Payer: Medicare Other

## 2018-03-29 DIAGNOSIS — R0603 Acute respiratory distress: Secondary | ICD-10-CM | POA: Diagnosis not present

## 2018-03-29 DIAGNOSIS — N179 Acute kidney failure, unspecified: Secondary | ICD-10-CM | POA: Diagnosis present

## 2018-03-29 DIAGNOSIS — N183 Chronic kidney disease, stage 3 unspecified: Secondary | ICD-10-CM

## 2018-03-29 DIAGNOSIS — K5090 Crohn's disease, unspecified, without complications: Secondary | ICD-10-CM | POA: Diagnosis present

## 2018-03-29 DIAGNOSIS — Z7901 Long term (current) use of anticoagulants: Secondary | ICD-10-CM

## 2018-03-29 DIAGNOSIS — D649 Anemia, unspecified: Secondary | ICD-10-CM | POA: Diagnosis present

## 2018-03-29 DIAGNOSIS — J181 Lobar pneumonia, unspecified organism: Secondary | ICD-10-CM | POA: Diagnosis present

## 2018-03-29 DIAGNOSIS — I5042 Chronic combined systolic (congestive) and diastolic (congestive) heart failure: Secondary | ICD-10-CM

## 2018-03-29 DIAGNOSIS — N289 Disorder of kidney and ureter, unspecified: Secondary | ICD-10-CM

## 2018-03-29 DIAGNOSIS — R0602 Shortness of breath: Secondary | ICD-10-CM | POA: Diagnosis not present

## 2018-03-29 DIAGNOSIS — R748 Abnormal levels of other serum enzymes: Secondary | ICD-10-CM

## 2018-03-29 DIAGNOSIS — I4891 Unspecified atrial fibrillation: Secondary | ICD-10-CM | POA: Diagnosis present

## 2018-03-29 DIAGNOSIS — R000 Tachycardia, unspecified: Secondary | ICD-10-CM | POA: Diagnosis not present

## 2018-03-29 DIAGNOSIS — Z880 Allergy status to penicillin: Secondary | ICD-10-CM

## 2018-03-29 DIAGNOSIS — I5033 Acute on chronic diastolic (congestive) heart failure: Secondary | ICD-10-CM | POA: Diagnosis present

## 2018-03-29 DIAGNOSIS — J9601 Acute respiratory failure with hypoxia: Secondary | ICD-10-CM

## 2018-03-29 DIAGNOSIS — I130 Hypertensive heart and chronic kidney disease with heart failure and stage 1 through stage 4 chronic kidney disease, or unspecified chronic kidney disease: Principal | ICD-10-CM | POA: Diagnosis present

## 2018-03-29 DIAGNOSIS — J810 Acute pulmonary edema: Secondary | ICD-10-CM | POA: Diagnosis not present

## 2018-03-29 DIAGNOSIS — R0689 Other abnormalities of breathing: Secondary | ICD-10-CM | POA: Diagnosis not present

## 2018-03-29 DIAGNOSIS — I248 Other forms of acute ischemic heart disease: Secondary | ICD-10-CM | POA: Diagnosis present

## 2018-03-29 DIAGNOSIS — J90 Pleural effusion, not elsewhere classified: Secondary | ICD-10-CM | POA: Diagnosis not present

## 2018-03-29 DIAGNOSIS — R069 Unspecified abnormalities of breathing: Secondary | ICD-10-CM | POA: Diagnosis not present

## 2018-03-29 DIAGNOSIS — Z79899 Other long term (current) drug therapy: Secondary | ICD-10-CM

## 2018-03-29 DIAGNOSIS — J189 Pneumonia, unspecified organism: Secondary | ICD-10-CM | POA: Diagnosis not present

## 2018-03-29 DIAGNOSIS — I351 Nonrheumatic aortic (valve) insufficiency: Secondary | ICD-10-CM | POA: Diagnosis not present

## 2018-03-29 DIAGNOSIS — R0902 Hypoxemia: Secondary | ICD-10-CM | POA: Diagnosis not present

## 2018-03-29 DIAGNOSIS — R7989 Other specified abnormal findings of blood chemistry: Secondary | ICD-10-CM

## 2018-03-29 DIAGNOSIS — R778 Other specified abnormalities of plasma proteins: Secondary | ICD-10-CM

## 2018-03-29 DIAGNOSIS — I361 Nonrheumatic tricuspid (valve) insufficiency: Secondary | ICD-10-CM | POA: Diagnosis not present

## 2018-03-29 DIAGNOSIS — I4819 Other persistent atrial fibrillation: Secondary | ICD-10-CM | POA: Diagnosis present

## 2018-03-29 LAB — CBC WITH DIFFERENTIAL/PLATELET
Abs Immature Granulocytes: 0.1 10*3/uL (ref 0.0–0.1)
BASOS ABS: 0 10*3/uL (ref 0.0–0.1)
Basophils Relative: 0 %
EOS PCT: 1 %
Eosinophils Absolute: 0.1 10*3/uL (ref 0.0–0.7)
HCT: 30.5 % — ABNORMAL LOW (ref 36.0–46.0)
HEMOGLOBIN: 8.9 g/dL — AB (ref 12.0–15.0)
Immature Granulocytes: 0 %
LYMPHS PCT: 4 %
Lymphs Abs: 0.5 10*3/uL — ABNORMAL LOW (ref 0.7–4.0)
MCH: 26.6 pg (ref 26.0–34.0)
MCHC: 29.2 g/dL — ABNORMAL LOW (ref 30.0–36.0)
MCV: 91.3 fL (ref 78.0–100.0)
Monocytes Absolute: 0.6 10*3/uL (ref 0.1–1.0)
Monocytes Relative: 6 %
NEUTROS ABS: 9.8 10*3/uL — AB (ref 1.7–7.7)
Neutrophils Relative %: 89 %
Platelets: 282 10*3/uL (ref 150–400)
RBC: 3.34 MIL/uL — AB (ref 3.87–5.11)
RDW: 13.1 % (ref 11.5–15.5)
WBC: 11.1 10*3/uL — AB (ref 4.0–10.5)

## 2018-03-29 LAB — IRON AND TIBC
IRON: 11 ug/dL — AB (ref 28–170)
SATURATION RATIOS: 3 % — AB (ref 10.4–31.8)
TIBC: 378 ug/dL (ref 250–450)
UIBC: 367 ug/dL

## 2018-03-29 LAB — RETICULOCYTES
RBC.: 3.26 MIL/uL — ABNORMAL LOW (ref 3.87–5.11)
Retic Count, Absolute: 39.1 10*3/uL (ref 19.0–186.0)
Retic Ct Pct: 1.2 % (ref 0.4–3.1)

## 2018-03-29 LAB — FERRITIN: Ferritin: 44 ng/mL (ref 11–307)

## 2018-03-29 LAB — VITAMIN B12: VITAMIN B 12: 248 pg/mL (ref 180–914)

## 2018-03-29 LAB — FOLATE: FOLATE: 52.5 ng/mL (ref >5.9)

## 2018-03-29 LAB — BASIC METABOLIC PANEL
Anion gap: 10 (ref 5–15)
BUN: 17 mg/dL (ref 8–23)
CO2: 21 mmol/L — ABNORMAL LOW (ref 22–32)
CREATININE: 1.57 mg/dL — AB (ref 0.44–1.00)
Calcium: 8.2 mg/dL — ABNORMAL LOW (ref 8.9–10.3)
Chloride: 105 mmol/L (ref 98–111)
GFR calc non Af Amer: 29 mL/min — ABNORMAL LOW (ref >60)
GFR, EST AFRICAN AMERICAN: 34 mL/min — AB (ref >60)
Glucose, Bld: 172 mg/dL — ABNORMAL HIGH (ref 70–99)
POTASSIUM: 4.3 mmol/L (ref 3.5–5.1)
SODIUM: 136 mmol/L (ref 135–145)

## 2018-03-29 LAB — TROPONIN I
TROPONIN I: 0.16 ng/mL — AB (ref <0.03)
TROPONIN I: 0.27 ng/mL — AB (ref <0.03)
Troponin I: 0.46 ng/mL (ref <0.03)

## 2018-03-29 LAB — BRAIN NATRIURETIC PEPTIDE: B NATRIURETIC PEPTIDE 5: 367.9 pg/mL — AB (ref 0.0–100.0)

## 2018-03-29 MED ORDER — ASPIRIN EC 325 MG PO TBEC
325.0000 mg | DELAYED_RELEASE_TABLET | Freq: Once | ORAL | Status: DC
  Filled 2018-03-29 (×2): qty 1

## 2018-03-29 MED ORDER — CARVEDILOL 3.125 MG PO TABS
3.1250 mg | ORAL_TABLET | Freq: Two times a day (BID) | ORAL | Status: DC
  Administered 2018-03-29 – 2018-04-01 (×7): 3.125 mg via ORAL
  Filled 2018-03-29 (×8): qty 1

## 2018-03-29 MED ORDER — DEXTROSE 5 % IV SOLN
0.5000 g | Freq: Three times a day (TID) | INTRAVENOUS | Status: DC
  Administered 2018-03-30 – 2018-04-01 (×6): 0.5 g via INTRAVENOUS
  Filled 2018-03-29 (×12): qty 0.5

## 2018-03-29 MED ORDER — POLYETHYLENE GLYCOL 3350 17 G PO PACK: 17.0000 g | PACK | Freq: Every day | ORAL | Status: DC | PRN

## 2018-03-29 MED ORDER — SODIUM CHLORIDE 0.9 % IV SOLN: 250.0000 mL | INTRAVENOUS | Status: DC | PRN

## 2018-03-29 MED ORDER — SODIUM CHLORIDE 0.9 % IV SOLN: 1.0000 g | Freq: Three times a day (TID) | INTRAVENOUS | Status: DC

## 2018-03-29 MED ORDER — FUROSEMIDE 10 MG/ML IJ SOLN
40.0000 mg | Freq: Once | INTRAMUSCULAR | Status: AC
  Administered 2018-03-29: 40 mg via INTRAVENOUS
  Filled 2018-03-29: qty 4

## 2018-03-29 MED ORDER — FUROSEMIDE 10 MG/ML IJ SOLN
40.0000 mg | Freq: Two times a day (BID) | INTRAMUSCULAR | Status: DC
  Administered 2018-03-29 – 2018-03-30 (×2): 40 mg via INTRAVENOUS
  Filled 2018-03-29 (×2): qty 4

## 2018-03-29 MED ORDER — ALBUTEROL SULFATE (2.5 MG/3ML) 0.083% IN NEBU: 2.5000 mg | INHALATION_SOLUTION | RESPIRATORY_TRACT | Status: DC | PRN

## 2018-03-29 MED ORDER — VANCOMYCIN HCL IN DEXTROSE 1-5 GM/200ML-% IV SOLN
1000.0000 mg | Freq: Once | INTRAVENOUS | Status: AC
  Administered 2018-03-29: 1000 mg via INTRAVENOUS
  Filled 2018-03-29: qty 200

## 2018-03-29 MED ORDER — MIRTAZAPINE 15 MG PO TABS
15.0000 mg | ORAL_TABLET | Freq: Every day | ORAL | Status: DC
  Administered 2018-03-29 – 2018-03-31 (×3): 15 mg via ORAL
  Filled 2018-03-29 (×3): qty 1

## 2018-03-29 MED ORDER — LATANOPROST 0.005 % OP SOLN
1.0000 [drp] | Freq: Every day | OPHTHALMIC | Status: DC
  Administered 2018-03-30 – 2018-03-31 (×3): 1 [drp] via OPHTHALMIC
  Filled 2018-03-29 (×3): qty 2.5

## 2018-03-29 MED ORDER — ACETAMINOPHEN 650 MG RE SUPP: 650.0000 mg | Freq: Four times a day (QID) | RECTAL | Status: DC | PRN

## 2018-03-29 MED ORDER — SODIUM CHLORIDE 0.9% FLUSH
3.0000 mL | Freq: Two times a day (BID) | INTRAVENOUS | Status: DC
  Administered 2018-03-29 – 2018-04-01 (×6): 3 mL via INTRAVENOUS

## 2018-03-29 MED ORDER — CLONAZEPAM 0.5 MG PO TABS
0.5000 mg | ORAL_TABLET | Freq: Two times a day (BID) | ORAL | Status: DC
  Administered 2018-03-29 – 2018-04-01 (×7): 0.5 mg via ORAL
  Filled 2018-03-29 (×7): qty 1

## 2018-03-29 MED ORDER — VENLAFAXINE HCL ER 75 MG PO CP24
150.0000 mg | ORAL_CAPSULE | Freq: Two times a day (BID) | ORAL | Status: DC
  Administered 2018-03-29 – 2018-04-01 (×7): 150 mg via ORAL
  Filled 2018-03-29 (×4): qty 2
  Filled 2018-03-29: qty 1
  Filled 2018-03-29 (×3): qty 2

## 2018-03-29 MED ORDER — SODIUM CHLORIDE 0.9 % IV SOLN: 2.0000 g | Freq: Three times a day (TID) | INTRAVENOUS | Status: DC

## 2018-03-29 MED ORDER — ACETAMINOPHEN 325 MG PO TABS: 650.0000 mg | ORAL_TABLET | Freq: Four times a day (QID) | ORAL | Status: DC | PRN

## 2018-03-29 MED ORDER — VANCOMYCIN HCL 500 MG IV SOLR
500.0000 mg | INTRAVENOUS | Status: DC
  Administered 2018-03-30: 500 mg via INTRAVENOUS
  Filled 2018-03-29: qty 500

## 2018-03-29 MED ORDER — ONDANSETRON HCL 4 MG PO TABS: 4.0000 mg | ORAL_TABLET | Freq: Four times a day (QID) | ORAL | Status: DC | PRN

## 2018-03-29 MED ORDER — ONDANSETRON HCL 4 MG/2ML IJ SOLN: 4.0000 mg | Freq: Four times a day (QID) | INTRAMUSCULAR | Status: DC | PRN

## 2018-03-29 MED ORDER — SODIUM CHLORIDE 0.9% FLUSH: 3.0000 mL | INTRAVENOUS | Status: DC | PRN

## 2018-03-29 MED ORDER — SODIUM CHLORIDE 0.9 % IV SOLN
1.0000 g | Freq: Once | INTRAVENOUS | Status: AC
  Administered 2018-03-29: 1 g via INTRAVENOUS
  Filled 2018-03-29: qty 1

## 2018-03-29 MED ORDER — FUROSEMIDE 20 MG PO TABS: 20.0000 mg | ORAL_TABLET | Freq: Every day | ORAL | Status: DC | PRN

## 2018-03-29 MED ORDER — SODIUM CHLORIDE 0.9% FLUSH
3.0000 mL | Freq: Two times a day (BID) | INTRAVENOUS | Status: DC
  Administered 2018-03-29 – 2018-04-01 (×5): 3 mL via INTRAVENOUS

## 2018-03-29 MED ORDER — APIXABAN 2.5 MG PO TABS
2.5000 mg | ORAL_TABLET | Freq: Two times a day (BID) | ORAL | Status: DC
  Administered 2018-03-29 – 2018-04-01 (×7): 2.5 mg via ORAL
  Filled 2018-03-29 (×8): qty 1

## 2018-03-29 MED ORDER — AMIODARONE HCL 200 MG PO TABS
200.0000 mg | ORAL_TABLET | Freq: Every day | ORAL | Status: DC
  Administered 2018-03-29 – 2018-04-01 (×4): 200 mg via ORAL
  Filled 2018-03-29 (×4): qty 1

## 2018-03-29 MED ORDER — FAMOTIDINE 20 MG PO TABS: 20.0000 mg | ORAL_TABLET | Freq: Every evening | ORAL | Status: DC | PRN

## 2018-03-29 NOTE — H&P (Addendum)
History and Physical  Melissa Mcdaniel ASU:[id number] DOB: 12/23/1932 DOA: 03/29/2018  PCP: Harlan Stains, MD   Chief Complaint: sudden SOB  HPI:  61yow PMH afib, diastolic CHF, Crohn's disease just d/c 9/6 on Levaquin tx for sepsis, pneumonia PRESENTED with sudden SOB at 3 AM. Admitted for acute resp failure, RLL pneumonia, acute diastolic CHF.  Patient reports feeling well upon discharge, no issues last evening, went to sleep without difficulty.  Was able to sleep on her side in the bed without any shortness of breath or pain.  She woke up at 3 AM today, went to the restroom, which time she became acutely short of breath.  No chest pain, no fever, no systemic symptoms, no specific aggravating or alleviating factors.  Severity of shortness of breath was severe.  EMS treated with bronchodilator, methylprednisolone, magnesium, CPAP with improvement.  ED Course: Treated with BiPAP and Lasix.  Review of Systems:  Negative for fever, visual changes, sore throat, rash, new muscle aches, chest pain,dysuria, bleeding, n/v/abdominal pain.  Past Medical History:  Diagnosis Date  . A-fib ([place])   . Congestive heart failure (CHF) ([place]) 05/2016  . Crohn's disease ([place])   . Dysrhythmia    afib dx approx. 2016 per pt  . Hip fracture ([place])   . Hypertension     Past Surgical History:  Procedure Laterality Date  . FEMUR IM NAIL Right 07/26/2016   Procedure: INTRAMEDULLARY (IM) NAIL FEMORAL;  Surgeon: Renette Butters, MD;  Location: [facility];  Service: Orthopedics;  Laterality: Right;  . FRACTURE SURGERY    . large intestine - partial removal  approx 1993   due to crohns     reports that she quit smoking about 42 years ago. Her smoking use included cigarettes. She has a 15.00 pack-year smoking history. She has never used smokeless tobacco. She reports that she does not drink alcohol or use drugs. Mobility: ambulatory   Allergies  Allergen Reactions  . Penicillins Swelling    Has patient had a  PCN reaction causing immediate rash, facial/tongue/throat swelling, SOB or lightheadedness with hypotension: Yes Has patient had a PCN reaction causing severe rash involving mucus membranes or skin necrosis: No Has patient had a PCN reaction that required hospitalization: No Has patient had a PCN reaction occurring within the last 10 years: No If all of the above answers are "NO", then may proceed with Cephalosporin use.     Family History  Problem Relation Age of Onset  . Lung cancer Mother   . Alzheimer's disease Father   . Diabetes Maternal Grandmother      Prior to Admission medications   Medication Sig Start Date End Date Taking? Authorizing Provider  albuterol (ACCUNEB) 1.25 MG/3ML nebulizer solution Take 3 mLs (1.25 mg total) by nebulization every 6 (six) hours as needed for wheezing or shortness of breath. 03/28/18  Yes Patrecia Pour, MD  alendronate (FOSAMAX) 70 MG tablet Take 70 mg by mouth once a week. Take with a full glass of water on an empty stomach. Saturdays   Yes [provider]  amiodarone (PACERONE) 200 MG tablet TAKE 1 TABLET DAILY Patient taking differently: Take 200 mg by mouth daily.  12/31/17  Yes Burnell Blanks, MD  apixaban (ELIQUIS) 2.5 MG TABS tablet Take 1 tablet (2.5 mg total) by mouth 2 (two) times daily. 11/14/17  Yes Burnell Blanks, MD  carvedilol (COREG) 3.125 MG tablet Take 1 tablet (3.125 mg total) by mouth 2 (two) times daily. 10/14/17 03/25/26 Yes  Burnell Blanks, MD  Cholecalciferol (VITAMIN D3) 2000 units TABS Take 2,000 Units by mouth daily.    Yes [provider]  clonazePAM (KLONOPIN) 0.5 MG tablet TK 1 T PO BID Patient taking differently: Take 0.5 mg by mouth 2 (two) times daily.  07/30/16  Yes Theodis Blaze, MD  Ferrous Sulfate (IRON) 28 MG TABS Take 1 tablet by mouth daily.   Yes [provider]  furosemide (LASIX) 20 MG tablet Take 1 tablet daily only as needed for weight gain Patient taking  differently: Take 20 mg by mouth daily as needed for fluid or edema.  08/30/17  Yes Lyda Jester M, PA-C  hydrALAZINE (APRESOLINE) 25 MG tablet Take 25 mg by mouth 2 (two) times daily.   Yes [provider]  latanoprost (XALATAN) 0.005 % ophthalmic solution Place 1 drop into both eyes at bedtime.   Yes [provider]  loperamide (IMODIUM) 2 MG capsule Take 4 mg by mouth 4 (four) times daily as needed for diarrhea or loose stools.   Yes [provider]  mirtazapine (REMERON) 15 MG tablet Take 15 mg by mouth at bedtime.    Yes [provider]  potassium chloride (K-DUR) 10 MEQ tablet Take 1 tablet only when you take a Lasix. Patient taking differently: Take 10 mEq by mouth daily as needed (with the Lasix).  08/30/17  Yes Rosita Fire, Brittainy M, PA-C  ranitidine (ZANTAC) 150 MG tablet Take 150 mg by mouth daily as needed for heartburn.   Yes [provider]  venlafaxine XR (EFFEXOR-XR) 150 MG 24 hr capsule Take 150 mg by mouth 2 (two) times daily.   Yes [provider]  levofloxacin (LEVAQUIN) 500 MG tablet Take 1 tablet (500 mg total) by mouth every other day for 4 days. 03/29/18 04/02/18  Patrecia Pour, MD    Physical Exam: Vitals:   03/29/18 0800 03/29/18 0814  BP: 122/65   Pulse: 87   Resp: 19   Temp:    SpO2: 98% 99%    Constitutional:   . Appears calm and comfortable on BiPAP Eyes:  . pupils and irises appear normal . Normal lids ENMT:  . grossly normal hearing  . Lips appear normal Neck:  . neck appears normal, no masses . no thyromegaly Respiratory:  . Fair air movement, no frank wheezes, rales or rhonchi. . Moderate increased respiratory effort on BiPAP.  Able to speak in short sentences. Cardiovascular:  . RRR, no m/r/g . No LE extremity edema   . Normal bilateral pedal pulses Abdomen:  . Abdomen appears normal; no tenderness or masses . No hepatomegaly Musculoskeletal:  . Digits/nails BUE: no clubbing, cyanosis,  petechiae, infection.  Acrylic nails. . RUE, LUE, RLE, LLE   o strength and tone normal, no atrophy, no abnormal movements o No tenderness, masses Skin:  . No rashes, lesions, ulcers . palpation of skin: no induration or nodules Psychiatric:  . Mental status o Mood, affect appropriate . judgment and insight appear intact    I have personally reviewed following labs and imaging studies  Labs:   Creatinine stable, 1.57, remainder BMP unremarkable  BNP 367.9  Troponin 0.16  Hemoglobin stable 8.9, WBC 11.1.  Remainder CBC unremarkable.  Imaging studies:   Chest x-ray independently reviewed showed right pleural effusion, significantly worse right base infiltrate  Medical tests:   EKG independently reviewed: Sinus rhythm, LVH, no significant change compared to previous study 03/25/2018  Test discussed with performing physician:  Decision to obtain old records:      Review and summation of old records:   As summarized in HPI  Echocardiogram 08/2016 LVEF 50-55%.  LVH.  Grade 2 diastolic dysfunction.  Principal Problem:   Acute respiratory failure with hypoxia (HCC) Active Problems:   A-fib (HCC)   Lobar pneumonia (HCC)   Acute on chronic diastolic CHF (congestive heart failure) (HCC)   Elevated troponin   CKD (chronic kidney disease), stage III (HCC)   Anemia   Assessment/Plan Acute hypoxic respiratory failure secondary to right lower lobe pneumonia, suspect component of acute diastolic CHF, doubt ACS --BiPAP, wean as tolerated.  Supplemental oxygen as needed.  Bronchodilators. --Broad-spectrum antibiotics.  At this point given significantly worse infiltrate on the right, I am suspicious for worsening pneumonia.  Patient denies choking episodes or difficulty swallowing.  Acute on chronic diastolic CHF.  LVEF 50-55% with grade 2 diastolic dysfunction by echo February 2018. --IV diuretics.  Daily BMP.  Strict I/O.  Elevated troponin.  Suspect demand ischemia.   No chest pain.  EKG nonacute.  Doubt ACS. --Trend troponin.  No further treatment unless troponin significantly elevates.  ADDENDUM. Second troponin 0.46. I was not notified. Patient seen 1800. No pain, no SOB. Off BiPAP. Eating dinner. Still suspect demand ischemia rather than ACS. Already on apixaban and carvedilol. Refused ASA. Will continue to trend troponin. If elevates further will check echo.  Atrial fibrillation.  Rate controlled. --Continue amiodarone, beta-blocker.  Suspected CKD stage III compared to previous laboratory studies in 2018.  Suspect creatinine is at baseline.   --Monitor daily BMP while on diuretics.  Chronic normocytic anemia.  Long-standing.  Etiology unclear.  Currently at baseline. --Check anemia panel.  Suggest outpatient evaluation  Severity of Illness: The appropriate patient status for this patient is INPATIENT. Inpatient status is judged to be reasonable and necessary in order to provide the required intensity of service to ensure the patient's safety. The patient's presenting symptoms, physical exam findings, and initial radiographic and laboratory data in the context of their chronic comorbidities is felt to place them at high risk for further clinical deterioration. Furthermore, it is not anticipated that the patient will be medically stable for discharge from the hospital within 2 midnights of admission. The following factors support the patient status of inpatient.   " The patient's presenting symptoms include shortness of breath. " The worrisome physical exam findings include moderate increased respiratory effort requiring BiPAP. " The initial radiographic and laboratory data are worrisome because of worsening right base pneumonia, elevated troponin, elevated BNP. " The chronic co-morbidities include atrial fibrillation, diastolic CHF, remote tobacco use.   * I certify that at the point of admission it is my clinical judgment that the patient will require  inpatient hospital care spanning beyond 2 midnights from the point of admission due to high intensity of service, high risk for further deterioration and high frequency of surveillance required.*  DVT prophylaxis: apixaban Code Status: Full Family Communication: husband at bedside Consults called: none    Time spent: 60 minutes  Murray Hodgkins, MD  [facility] Direct contact: [phone] --[place]  --[email or web address]; password [email or web address]  7PM-7AM contact night coverage as above  03/29/2018, 9:18 AM

## 2018-03-29 NOTE — Progress Notes (Signed)
Pharmacy Antibiotic Note  Melissa Mcdaniel is a 84 y.o. female admitted on 03/29/2018 with SOB.  Patient was recently treated fore sepsis/PNA and discharged home on Levaquin.  Pharmacy has been consulted for vancomycin dosing for RLL PNA.  She is also started on Azactam (PCN allergy of swelling).  SCr 1.57, CrCL 20 ml/min, afebrile, WBC 11.1.   Plan: Vanc 1gm IV x 1, then 5103m IV Q24H x8 days total (borderline CrCL) Change Azactam to 5042mIV Q8H Monitor renal fxn, clinical progress, vanc trough as indicated   Height: 5' (152.4 cm) Weight: 101 lb 6.6 oz (46 kg) IBW/kg (Calculated) : 45.5  Temp (24hrs), Avg:98.6 F (37 C), Min:98.6 F (37 C), Max:98.6 F (37 C)  Recent Labs  Lab 03/25/18 1437 03/25/18 1817 03/26/18 0618 03/27/18 0353 03/28/18 0635 03/29/18 0622  WBC 7.0  --  6.0  --   --  11.1*  CREATININE 2.07*  --  1.67* 1.49* 1.41* 1.57*  LATICACIDVEN  --  0.81  --   --   --   --     Estimated Creatinine Clearance: 19.5 mL/min (A) (by C-G formula based on SCr of 1.57 mg/dL (H)).    Allergies  Allergen Reactions  . Penicillins Swelling    Has patient had a PCN reaction causing immediate rash, facial/tongue/throat swelling, SOB or lightheadedness with hypotension: Yes Has patient had a PCN reaction causing severe rash involving mucus membranes or skin necrosis: No Has patient had a PCN reaction that required hospitalization: No Has patient had a PCN reaction occurring within the last 10 years: No If all of the above answers are "NO", then may proceed with Cephalosporin use.      Melissa Ellery D. DaMina MarblePharmD, BCPS, BCKennewick/01/2018, 9:44 AM

## 2018-03-29 NOTE — ED Notes (Signed)
Delay in lab draw, xray at bedside.

## 2018-03-29 NOTE — ED Provider Notes (Signed)
[facility] EMERGENCY DEPARTMENT Provider Note   CSN: [id number] Arrival date & time: 03/29/18  9323     History   Chief Complaint Chief Complaint  Patient presents with  . Respiratory Distress    HPI Melissa Mcdaniel is a 84 y.o. female.  The history is provided by the patient and the EMS personnel.  She has history of hypertension, atrial fibrillation, congestive heart failure and was discharged from the hospital yesterday after being admitted for pneumonia.  She had been doing well until she woke up and walked to the bathroom and got very dyspneic.  She denies chest pain, heaviness, tightness, pressure.  She denies nausea, vomiting, diaphoresis.  She did try using her home nebulizer without any benefit and EMS was called.  EMS noted patient was hypoxic and in respiratory distress and treated her with albuterol via nebulizer, methylprednisolone, magnesium sulfate, CPAP with significant improvement.  Patient states she is feeling better at this point.  Of note, she is on chronic anticoagulation because of history of atrial fibrillation.  Past Medical History:  Diagnosis Date  . A-fib ([place])   . Congestive heart failure (CHF) ([place]) 05/2016  . Crohn's disease ([place])   . Dysrhythmia    afib dx approx. 2016 per pt  . Hip fracture ([place])   . Hypertension     Patient Active Problem List   Diagnosis Date Noted  . Sepsis ([place]) 03/25/2018  . Malnutrition of moderate degree 07/26/2016  . Closed right hip fracture, initial encounter ([place]) 07/24/2016  . Hypertension 07/24/2016  . A-fib ([place]) 07/24/2016  . Fall 07/24/2016  . Congestive heart failure (CHF) ([place]) 05/23/2016    Past Surgical History:  Procedure Laterality Date  . FEMUR IM NAIL Right 07/26/2016   Procedure: INTRAMEDULLARY (IM) NAIL FEMORAL;  Surgeon: Renette Butters, MD;  Location: [facility];  Service: Orthopedics;  Laterality: Right;  . FRACTURE SURGERY    . large intestine - partial removal  approx 1993    due to crohns     OB History   None      Home Medications    Prior to Admission medications   Medication Sig Start Date End Date Taking? Authorizing Provider  albuterol (ACCUNEB) 1.25 MG/3ML nebulizer solution Take 3 mLs (1.25 mg total) by nebulization every 6 (six) hours as needed for wheezing or shortness of breath. 03/28/18   Patrecia Pour, MD  alendronate (FOSAMAX) 70 MG tablet Take 70 mg by mouth once a week. Take with a full glass of water on an empty stomach. Saturdays    [provider]  amiodarone (PACERONE) 200 MG tablet TAKE 1 TABLET DAILY Patient taking differently: Take 200 mg by mouth daily.  12/31/17   Burnell Blanks, MD  apixaban (ELIQUIS) 2.5 MG TABS tablet Take 1 tablet (2.5 mg total) by mouth 2 (two) times daily. 11/14/17   Burnell Blanks, MD  carvedilol (COREG) 3.125 MG tablet Take 1 tablet (3.125 mg total) by mouth 2 (two) times daily. 10/14/17 03/25/26  Burnell Blanks, MD  Cholecalciferol (VITAMIN D3) 2000 units TABS Take 2,000 Units by mouth daily.     [provider]  clonazePAM (KLONOPIN) 0.5 MG tablet TK 1 T PO BID Patient taking differently: Take 0.5 mg by mouth 2 (two) times daily.  07/30/16   Theodis Blaze, MD  Ferrous Sulfate (IRON) 28 MG TABS Take 1 tablet by mouth daily.    [provider]  furosemide (LASIX) 20 MG tablet Take  1 tablet daily only as needed for weight gain Patient taking differently: Take 20 mg by mouth daily as needed for fluid or edema.  08/30/17   Lyda Jester M, PA-C  hydrALAZINE (APRESOLINE) 25 MG tablet Take 25 mg by mouth 2 (two) times daily.    [provider]  latanoprost (XALATAN) 0.005 % ophthalmic solution Place 1 drop into both eyes at bedtime.    [provider]  levofloxacin (LEVAQUIN) 500 MG tablet Take 1 tablet (500 mg total) by mouth every other day for 4 days. 03/29/18 04/02/18  Patrecia Pour, MD  loperamide (IMODIUM) 2 MG capsule Take 4 mg by mouth 4  (four) times daily as needed for diarrhea or loose stools.    [provider]  mirtazapine (REMERON) 15 MG tablet Take 15 mg by mouth at bedtime.     [provider]  potassium chloride (K-DUR) 10 MEQ tablet Take 1 tablet only when you take a Lasix. Patient taking differently: Take 10 mEq by mouth daily as needed (with the Lasix).  08/30/17   Lyda Jester M, PA-C  ranitidine (ZANTAC) 150 MG tablet Take 150 mg by mouth daily as needed for heartburn.    [provider]  venlafaxine XR (EFFEXOR-XR) 150 MG 24 hr capsule Take 150 mg by mouth 2 (two) times daily.    [provider]    Family History Family History  Problem Relation Age of Onset  . Lung cancer Mother   . Alzheimer's disease Father   . Diabetes Maternal Grandmother     Social History Social History   Tobacco Use  . Smoking status: Former Smoker    Packs/day: 0.50    Years: 30.00    Pack years: 15.00    Types: Cigarettes    Last attempt to quit: 09/15/1975    Years since quitting: 42.5  . Smokeless tobacco: Never Used  Substance Use Topics  . Alcohol use: No  . Drug use: No     Allergies   Penicillins   Review of Systems Review of Systems  All other systems reviewed and are negative.    Physical Exam Updated Vital Signs BP 121/64   Pulse 93   Temp 98.6 F (37 C) (Oral)   Resp (!) 24   Ht 5' (1.524 m)   Wt 46 kg   SpO2 98%   BMI 19.81 kg/m   Physical Exam  Nursing note and vitals reviewed.  84 year old female, on BiPAP, resting comfortably and in no acute distress. Vital signs are significant for rapid respiratory rate. Oxygen saturation is 98%, which is normal. Head is normocephalic and atraumatic. PERRLA, EOMI. Oropharynx is clear. Neck is nontender and supple without adenopathy or JVD. Back is nontender and there is no CVA tenderness. Lungs have few bibasilar rales going about one third of the way up.  No wheezes or rhonchi appreciated. Chest is  nontender. Heart has regular rate and rhythm without murmur. Abdomen is soft, flat, nontender without masses or hepatosplenomegaly and peristalsis is normoactive. Extremities have no cyanosis or edema, full range of motion is present. Skin is warm and dry without rash. Neurologic: Mental status is normal, cranial nerves are intact, there are no motor or sensory deficits.  ED Treatments / Results  Labs (all labs ordered are listed, but only abnormal results are displayed) Labs Reviewed  BASIC METABOLIC PANEL - Abnormal; Notable for the following components:      Result Value   CO2 21 (*)  Glucose, Bld 172 (*)    Creatinine, Ser 1.57 (*)    Calcium 8.2 (*)    GFR calc non Af Amer 29 (*)    GFR calc Af Amer 34 (*)    All other components within normal limits  BRAIN NATRIURETIC PEPTIDE - Abnormal; Notable for the following components:   B Natriuretic Peptide 367.9 (*)    All other components within normal limits  CBC WITH DIFFERENTIAL/PLATELET - Abnormal; Notable for the following components:   WBC 11.1 (*)    RBC 3.34 (*)    Hemoglobin 8.9 (*)    HCT 30.5 (*)    MCHC 29.2 (*)    Neutro Abs 9.8 (*)    Lymphs Abs 0.5 (*)    All other components within normal limits  TROPONIN I - Abnormal; Notable for the following components:   Troponin I 0.16 (*)    All other components within normal limits    EKG EKG Interpretation  Date/Time:  Saturday March 29 2018 06:05:56 EDT Ventricular Rate:  94 PR Interval:    QRS Duration: 88 QT Interval:  380 QTC Calculation: 476 R Axis:   82 Text Interpretation:  Sinus rhythm Borderline right axis deviation Left ventricular hypertrophy Anterior Q waves, possibly due to LVH When compared with ECG of 03/25/2017, T wave amplitude has decreased in multiple leads Confirmed by Delora Fuel ([id number]) on 03/29/2018 6:16:17 AM   Radiology Dg Chest Port 1 View  Result Date: 03/29/2018 CLINICAL DATA:  Shortness of breath EXAM: PORTABLE CHEST 1 VIEW  COMPARISON:  Chest radiograph 03/25/2018 FINDINGS: Shallow lung inflation with diffuse interstitial opacity and consolidation at the right lung base. Small right pleural effusion. Normal cardiomediastinal contours. Calcific aortic atherosclerosis. IMPRESSION: Worsening right basilar pneumonia with superimposed interstitial pulmonary edema and new right-sided pleural effusion. Electronically Signed   By: Ulyses Jarred M.D.   On: 03/29/2018 06:36    Procedures Procedures  CRITICAL CARE Performed by: Delora Fuel Total critical care time: 60 minutes Critical care time was exclusive of separately billable procedures and treating other patients. Critical care was necessary to treat or prevent imminent or life-threatening deterioration. Critical care was time spent personally by me on the following activities: development of treatment plan with patient and/or surrogate as well as nursing, discussions with consultants, evaluation of patient's response to treatment, examination of patient, obtaining history from patient or surrogate, ordering and performing treatments and interventions, ordering and review of laboratory studies, ordering and review of radiographic studies, pulse oximetry and re-evaluation of patient's condition.  Medications Ordered in ED Medications  furosemide (LASIX) injection 40 mg (has no administration in time range)   Initial Impression / Assessment and Plan / ED Course  I have reviewed the triage vital signs and the nursing notes.  Pertinent labs & imaging results that were available during my care of the patient were reviewed by me and considered in my medical decision making (see chart for details).  Sudden onset of dyspnea and patient recently discharged from the hospital because of pneumonia.  Old records are reviewed confirming discharge yesterday with diagnosis of pneumonia, sent home to complete course of antibiotics.  ECG shows no acute changes.  Will check chest x-ray to  see if there has been any interval change, also check BNP.  Chest x-ray shows development of pulmonary edema.  Worsening appearance of the infiltrate is probably related to fluid status and not actual change in the pneumonia.  BNP has come back moderately elevated.  Troponin is  mildly elevated and felt to represent demand ischemia.  Anemia and renal insufficiency are present and not significantly changed from baseline.  She is given a dose of furosemide intravenously.  She is resting comfortably on the BiPAP and using accessory muscles of respiration.  Case is discussed with Dr. Sarajane Jews of [facility], who agrees to admit the patient.  Final Clinical Impressions(s) / ED Diagnoses   Final diagnoses:  Respiratory distress  Acute pulmonary edema ([place])  Community acquired pneumonia of right lower lobe of lung (HCC)  Elevated troponin I level  Renal insufficiency  Normochromic normocytic anemia    ED Discharge Orders    None       Delora Fuel, MD 98/02/21 [phone]

## 2018-03-29 NOTE — Progress Notes (Signed)
RT called for BIPAP coming in via [facility] in respiratory distress for C25. RT placed pt on BIPAP upon arrival. Setting:12/6, rate of 12, FIO2 of 40%. Pt is resting comfortably on BIPAP. RT will continue to monitor.

## 2018-03-29 NOTE — ED Triage Notes (Signed)
Pt arrives via EMS from home with respiratory distress started at 0300 after using the bathroom. EMS called and gave 2 duonebs and applied cpap. Pt becomes distressed with minimal exertion. Recent diagnosis of PNA. EMS gave 2 SL nitro, 125 MG solumedrol, 2grams magnesium.

## 2018-03-30 DIAGNOSIS — N183 Chronic kidney disease, stage 3 (moderate): Secondary | ICD-10-CM

## 2018-03-30 DIAGNOSIS — I248 Other forms of acute ischemic heart disease: Secondary | ICD-10-CM

## 2018-03-30 DIAGNOSIS — J90 Pleural effusion, not elsewhere classified: Secondary | ICD-10-CM

## 2018-03-30 LAB — TROPONIN I: TROPONIN I: 0.14 ng/mL — AB (ref <0.03)

## 2018-03-30 LAB — CULTURE, BLOOD (ROUTINE X 2)
Culture: NO GROWTH
Culture: NO GROWTH
SPECIAL REQUESTS: ADEQUATE

## 2018-03-30 LAB — BASIC METABOLIC PANEL
ANION GAP: 12 (ref 5–15)
BUN: 25 mg/dL — ABNORMAL HIGH (ref 8–23)
CO2: 20 mmol/L — ABNORMAL LOW (ref 22–32)
CREATININE: 2.05 mg/dL — AB (ref 0.44–1.00)
Calcium: 7.9 mg/dL — ABNORMAL LOW (ref 8.9–10.3)
Chloride: 101 mmol/L (ref 98–111)
GFR calc non Af Amer: 21 mL/min — ABNORMAL LOW (ref >60)
GFR, EST AFRICAN AMERICAN: 25 mL/min — AB (ref >60)
Glucose, Bld: 177 mg/dL — ABNORMAL HIGH (ref 70–99)
POTASSIUM: 3.5 mmol/L (ref 3.5–5.1)
Sodium: 133 mmol/L — ABNORMAL LOW (ref 135–145)

## 2018-03-30 MED ORDER — VANCOMYCIN HCL 500 MG IV SOLR: 500.0000 mg | INTRAVENOUS | Status: DC

## 2018-03-30 NOTE — Progress Notes (Addendum)
PROGRESS NOTE  Melissa Mcdaniel WLN:[id number] DOB: 1933/04/19 DOA: 03/29/2018 PCP: Harlan Stains, MD  Brief Narrative: 63yow PMH afib, diastolic CHF, Crohn's disease just d/c 9/6 on Levaquin tx for sepsis, pneumonia PRESENTED with sudden SOB at 3 AM. Admitted for acute resp failure, RLL pneumonia, acute diastolic CHF.  Assessment/Plan Acute hypoxic respiratory failure secondary to right lower lobe pneumonia, suspect component of acute diastolic CHF, doubt ACS --Hypoxia resolved.  No further need for BiPAP.  We will continue antibiotics and repeat chest x-ray in the morning to reassess effusion and infiltrate.    Acute on chronic diastolic CHF.  LVEF 50-55% with grade 2 diastolic dysfunction by echo February 2018. --Appears euvolemic.  Stop IV Lasix.  Creatinine modestly elevated likely secondary to diuresis.  Check BMP in a.m. --Beta-blocker.  Demand ischemia. --Troponin has peaked, trending down.  EKG nonacute.  Completely asymptomatic.  No evidence of ACS.  Follow-up echocardiogram.  If unremarkable, no further evaluation suggested.  Atrial fibrillation.  Rate controlled. --Stable.  Continue amiodarone and beta-blocker  Suspected CKD stage III compared to previous laboratory studies in 2018.  Suspect creatinine is at baseline.   --Repeat BMP in a.m.  Chronic normocytic anemia.  Long-standing.  Etiology   Much improved.  Hold diuresis today. Continue antibiotics. Check echocardiogram.  Repeat chest x-ray tomorrow.  If continues to improve, likely home in 48 hours.  DVT prophylaxis: apixaban Code Status: Full Family Communication: none Disposition Plan: home    Murray Hodgkins, MD  [facility] Direct contact: [phone] --Via amion app OR  --[email or web address]; password [email or web address]  7PM-7AM contact night coverage as above 03/30/2018, 10:05 AM  LOS: 1 day   Consultants:    Procedures:    Antimicrobials:  Vancomycin 9/7 >  Aztreonam 9/7 >  Interval  history/Subjective: Feels better, no CP, no SOB  Objective: Vitals:  Vitals:   03/30/18 0506 03/30/18 0745  BP: (!) 93/47 119/72  Pulse: 67 77  Resp: 18 20  Temp: 97.6 F (36.4 C) 97.8 F (36.6 C)  SpO2: 96% 96%    Exam:  Constitutional:  . Appears calm and comfortable Respiratory:  . CTA bilaterally, no w/r/r.  . Respiratory effort normal.  Cardiovascular:  . RRR, no m/r/g Musculoskeletal:  . Ambulates without difficulty Psychiatric:  . Mental status o Mood, affect appropriate  I have personally reviewed the following:   Data: . Troponin 0.46, 0.27, 0.14 . Creatinine up to 2.05, BUN 25.  Potassium within normal limits. . Blood cultures pending  Scheduled Meds: . amiodarone  200 mg Oral Daily  . apixaban  2.5 mg Oral BID  . aspirin EC  325 mg Oral Once  . carvedilol  3.125 mg Oral BID  . clonazePAM  0.5 mg Oral BID  . latanoprost  1 drop Both Eyes QHS  . mirtazapine  15 mg Oral QHS  . sodium chloride flush  3 mL Intravenous Q12H  . sodium chloride flush  3 mL Intravenous Q12H  . venlafaxine XR  150 mg Oral BID   Continuous Infusions: . sodium chloride    . aztreonam Stopped (03/30/18 0700)  . vancomycin      Principal Problem:   Acute respiratory failure with hypoxia (HCC) Active Problems:   A-fib (HCC)   Lobar pneumonia (HCC)   Acute on chronic diastolic CHF (congestive heart failure) (HCC)   Elevated troponin   CKD (chronic kidney disease), stage III (HCC)   Anemia   LOS: 1 day

## 2018-03-30 NOTE — Plan of Care (Signed)
Reviewed disease process and medications with pt and husband who asked appropriate questions and indicated understanding verbally.

## 2018-03-30 NOTE — Progress Notes (Signed)
Pharmacy Antibiotic Note  Melissa Mcdaniel is a 84 y.o. female admitted on 03/29/2018 with SOB.  Patient was recently treated fore sepsis/PNA and discharged home on Levaquin.  Pharmacy has been consulted for vancomycin dosing for RLL PNA.  She is also started on Azactam (PCN allergy of swelling).  Renal function worse today with SCr 2.05, CrCL ~15 ml/min, afebrile, WBC 11.1 Will increase dosing interval of vancomycin given the patient's decreased renal function today and continue to monitor her clinical improvement.  Plan: Vanc 557m IV Q48H x8 days total (borderline CrCL) Continue Azactam 5033mIV Q8H Monitor renal fxn, clinical progress, vanc trough as indicated   Height: 5' (152.4 cm) Weight: 104 lb 4.4 oz (47.3 kg) IBW/kg (Calculated) : 45.5  Temp (24hrs), Avg:97.7 F (36.5 C), Min:97.5 F (36.4 C), Max:97.9 F (36.6 C)  Recent Labs  Lab 03/25/18 1437 03/25/18 1817 03/26/18 0618 03/27/18 0353 03/28/18 0635 03/29/18 0622 03/29/18 2352  WBC 7.0  --  6.0  --   --  11.1*  --   CREATININE 2.07*  --  1.67* 1.49* 1.41* 1.57* 2.05*  LATICACIDVEN  --  0.81  --   --   --   --   --     Estimated Creatinine Clearance: 14.9 mL/min (A) (by C-G formula based on SCr of 2.05 mg/dL (H)).    Allergies  Allergen Reactions  . Penicillins Swelling    Has patient had a PCN reaction causing immediate rash, facial/tongue/throat swelling, SOB or lightheadedness with hypotension: Yes Has patient had a PCN reaction causing severe rash involving mucus membranes or skin necrosis: No Has patient had a PCN reaction that required hospitalization: No Has patient had a PCN reaction occurring within the last 10 years: No If all of the above answers are "NO", then may proceed with Cephalosporin use.      AmBrendolyn PattyPharmD PGY1 Pharmacy Resident Phone [phone]/02/2018   2:45 PM

## 2018-03-31 ENCOUNTER — Inpatient Hospital Stay (HOSPITAL_COMMUNITY): Payer: Medicare Other

## 2018-03-31 DIAGNOSIS — I351 Nonrheumatic aortic (valve) insufficiency: Secondary | ICD-10-CM

## 2018-03-31 DIAGNOSIS — I361 Nonrheumatic tricuspid (valve) insufficiency: Secondary | ICD-10-CM

## 2018-03-31 LAB — ECHOCARDIOGRAM COMPLETE
HEIGHTINCHES: 60 in
WEIGHTICAEL: 1689.6054 [oz_av]

## 2018-03-31 LAB — BASIC METABOLIC PANEL
Anion gap: 11 (ref 5–15)
BUN: 34 mg/dL — AB (ref 8–23)
CO2: 24 mmol/L (ref 22–32)
CREATININE: 2.18 mg/dL — AB (ref 0.44–1.00)
Calcium: 8.2 mg/dL — ABNORMAL LOW (ref 8.9–10.3)
Chloride: 102 mmol/L (ref 98–111)
GFR calc Af Amer: 23 mL/min — ABNORMAL LOW (ref >60)
GFR, EST NON AFRICAN AMERICAN: 20 mL/min — AB (ref >60)
GLUCOSE: 83 mg/dL (ref 70–99)
POTASSIUM: 4.6 mmol/L (ref 3.5–5.1)
Sodium: 137 mmol/L (ref 135–145)

## 2018-03-31 MED ORDER — DOXYCYCLINE HYCLATE 100 MG PO TABS
100.0000 mg | ORAL_TABLET | Freq: Two times a day (BID) | ORAL | Status: DC
  Administered 2018-03-31 – 2018-04-01 (×3): 100 mg via ORAL
  Filled 2018-03-31 (×3): qty 1

## 2018-03-31 NOTE — Progress Notes (Signed)
PROGRESS NOTE  Melissa Mcdaniel AUQ:[id number] DOB: 30-Dec-1932 DOA: 03/29/2018 PCP: Harlan Stains, MD  Brief Narrative: 24yow PMH afib, diastolic CHF, Crohn's disease just d/c 9/6 on Levaquin tx for sepsis, pneumonia PRESENTED with sudden SOB at 3 AM. Admitted for acute resp failure, RLL pneumonia, acute diastolic CHF.  Assessment/Plan Acute hypoxic respiratory failure secondary to right lower lobe pneumonia, suspect component of acute diastolic CHF, doubt ACS --Hypoxia resolved.  No fever.  Will continue empiric antibiotics, can likely change to oral therapy 9/9.     Acute on chronic diastolic CHF.  LVEF 50-55% with grade 2 diastolic dysfunction by echo February 2018. --Appears euvolemic.  Lasix discontinued.  Creatinine modestly elevated further, likely related to diuresis, doubt vancomycin related.   --Continue beta-blocker.  Demand ischemia. --Troponin peaked.  EKG nonacute.  Completely asymptomatic.  No evidence of ACS.  Echocardiogram pending.  Unless significant abnormalities noted, no further evaluation would be suggested.  Atrial fibrillation.  Rate controlled. --Stable.  Continue amiodarone and beta-blocker  AKI superimposed on CKD stage III suspected compared to previous laboratory studies in 2018.    --Creatinine trending up, plan as above, discontinue vancomycin, repeat BMP in a.m.  Chronic normocytic anemia.  Long-standing.  Etiology   Overall appears well but creatinine continues to rise despite withholding of diuretic.  Expected peak today, if creatinine better tomorrow would anticipate discharge home on oral antibiotics, pending results of echocardiogram.    DVT prophylaxis: apixaban Code Status: Full Family Communication: Husband at bedside Disposition Plan: home    Murray Hodgkins, MD  [facility] Direct contact: [phone] --Via [place]  --[email or web address]; password [email or web address]  7PM-7AM contact night coverage as above 03/31/2018, 12:34 PM  LOS: 2  days   Consultants:    Procedures:    Antimicrobials:  Vancomycin 9/7 > 9/9  Aztreonam 9/7 >  doxycycline 9/9   Interval history/Subjective: Feels fine, poor sleep, nervous; breathing ok. No pain.   Objective: Vitals:  Vitals:   03/30/18 2249 03/31/18 0818  BP: (!) 125/58 (!) 162/76  Pulse: 71 82  Resp: 16 18  Temp: 97.9 F (36.6 C) 98.3 F (36.8 C)  SpO2: 100% 94%    Exam: Constitutional:   . Appears calm and comfortable Respiratory:  . CTA bilaterally, no w/r/r.  . Respiratory effort normal.  Cardiovascular:  . RRR, no m/r/g . No LE extremity edema   Psychiatric:  . Mental status o Mood, affect appropriate  I have personally reviewed the following:   Data: Creatinine up a bit, 34.  Creatinine up a bit, 2.18.  Chest x-ray independently reviewed, no significant change.  Right lower lobe infiltrate and mild pulmonary effusion seen.   Scheduled Meds: . amiodarone  200 mg Oral Daily  . apixaban  2.5 mg Oral BID  . carvedilol  3.125 mg Oral BID  . clonazePAM  0.5 mg Oral BID  . doxycycline  100 mg Oral Q12H  . latanoprost  1 drop Both Eyes QHS  . mirtazapine  15 mg Oral QHS  . sodium chloride flush  3 mL Intravenous Q12H  . sodium chloride flush  3 mL Intravenous Q12H  . venlafaxine XR  150 mg Oral BID   Continuous Infusions: . sodium chloride    . aztreonam Stopped (03/31/18 0700)    Principal Problem:   Acute respiratory failure with hypoxia (HCC) Active Problems:   A-fib (HCC)   Lobar pneumonia (HCC)   Acute on chronic diastolic CHF (congestive heart failure) (HCC)   Elevated troponin  CKD (chronic kidney disease), stage III (HCC)   Anemia   LOS: 2 days

## 2018-03-31 NOTE — Progress Notes (Signed)
  Echocardiogram 2D Echocardiogram has been performed.  Melissa Mcdaniel L Androw 03/31/2018, 11:02 AM

## 2018-04-01 LAB — BASIC METABOLIC PANEL
Anion gap: 11 (ref 5–15)
BUN: 23 mg/dL (ref 8–23)
CO2: 23 mmol/L (ref 22–32)
Calcium: 8 mg/dL — ABNORMAL LOW (ref 8.9–10.3)
Chloride: 102 mmol/L (ref 98–111)
Creatinine, Ser: 1.59 mg/dL — ABNORMAL HIGH (ref 0.44–1.00)
GFR calc Af Amer: 34 mL/min — ABNORMAL LOW (ref >60)
GFR calc non Af Amer: 29 mL/min — ABNORMAL LOW (ref >60)
GLUCOSE: 85 mg/dL (ref 70–99)
POTASSIUM: 3.7 mmol/L (ref 3.5–5.1)
Sodium: 136 mmol/L (ref 135–145)

## 2018-04-01 MED ORDER — DOXYCYCLINE HYCLATE 100 MG PO TABS: 100.0000 mg | ORAL_TABLET | Freq: Two times a day (BID) | ORAL | 0 refills | Status: DC

## 2018-04-01 MED ORDER — LEVOFLOXACIN 500 MG PO TABS
500.0000 mg | ORAL_TABLET | ORAL | Status: DC
  Administered 2018-04-01: 500 mg via ORAL
  Filled 2018-04-01: qty 1

## 2018-04-01 NOTE — Progress Notes (Addendum)
ANTIBIOTIC CONSULT NOTE - INITIAL  Pharmacy Consult for Levaquin Indication: HCAP  Allergies  Allergen Reactions  . Penicillins Swelling    Has patient had a PCN reaction causing immediate rash, facial/tongue/throat swelling, SOB or lightheadedness with hypotension: Yes Has patient had a PCN reaction causing severe rash involving mucus membranes or skin necrosis: No Has patient had a PCN reaction that required hospitalization: No Has patient had a PCN reaction occurring within the last 10 years: No If all of the above answers are "NO", then may proceed with Cephalosporin use.     Patient Measurements: Height: 5' (152.4 cm) Weight: 105 lb 9.6 oz (47.9 kg) IBW/kg (Calculated) : 45.5 Adjusted Body Weight:   Vital Signs: Temp: 98.9 F (37.2 C) (09/09 2300) Temp Source: Oral (09/09 2300) BP: 156/62 (09/09 2300) Pulse Rate: 88 (09/09 2300) Intake/Output from previous day: 09/09 0701 - 09/10 0700 In: 770 [P.O.:720; IV Piggyback:50] Out: 480 [Urine:480] Intake/Output from this shift: No intake/output data recorded.  Labs: Recent Labs    03/29/18 2352 03/31/18 0251 04/01/18 0456  CREATININE 2.05* 2.18* 1.59*   Estimated Creatinine Clearance: 19.3 mL/min (A) (by C-G formula based on SCr of 1.59 mg/dL (H)). No results for input(s): VANCOTROUGH, VANCOPEAK, VANCORANDOM, GENTTROUGH, GENTPEAK, GENTRANDOM, TOBRATROUGH, TOBRAPEAK, TOBRARND, AMIKACINPEAK, AMIKACINTROU, AMIKACIN in the last 72 hours.   Microbiology: Recent Results (from the past 720 hour(s))  Blood Culture (routine x 2)     Status: None   Collection Time: 03/25/18  6:06 PM  Result Value Ref Range Status   Specimen Description BLOOD RIGHT ANTECUBITAL  Final   Special Requests   Final    BOTTLES DRAWN AEROBIC AND ANAEROBIC Blood Culture adequate volume   Culture   Final    NO GROWTH 5 DAYS Performed at [facility], [address], [place], [place] [address]    Report Status 03/30/2018 FINAL  Final  Blood  Culture (routine x 2)     Status: None   Collection Time: 03/25/18  6:10 PM  Result Value Ref Range Status   Specimen Description BLOOD LEFT WRIST  Final   Special Requests   Final    BOTTLES DRAWN AEROBIC AND ANAEROBIC Blood Culture results may not be optimal due to an inadequate volume of blood received in culture bottles   Culture   Final    NO GROWTH 5 DAYS Performed at [facility], [place] [address] [place], [place] [address]    Report Status 03/30/2018 FINAL  Final  Culture, blood (routine x 2) Call MD if unable to obtain prior to antibiotics being given     Status: None (Preliminary result)   Collection Time: 03/29/18  1:23 PM  Result Value Ref Range Status   Specimen Description BLOOD RIGHT HAND  Final   Special Requests   Final    BOTTLES DRAWN AEROBIC AND ANAEROBIC Blood Culture results may not be optimal due to an inadequate volume of blood received in culture bottles   Culture   Final    NO GROWTH 3 DAYS Performed at [facility], [place] [address] [place], [place] [address]    Report Status PENDING  Incomplete  Culture, blood (routine x 2) Call MD if unable to obtain prior to antibiotics being given     Status: None (Preliminary result)   Collection Time: 03/29/18  2:46 PM  Result Value Ref Range Status   Specimen Description BLOOD RIGHT ANTECUBITAL  Final   Special Requests   Final  BOTTLES DRAWN AEROBIC AND ANAEROBIC Blood Culture adequate volume   Culture   Final    NO GROWTH 3 DAYS Performed at [facility], [place] [address], [place], [place] [address]    Report Status PENDING  Incomplete    Medical History: Past Medical History:  Diagnosis Date  . A-fib ([place])   . Congestive heart failure (CHF) ([place]) 05/2016  . Crohn's disease ([place])   . Dysrhythmia    afib dx approx. 2016 per pt  . Hip fracture ([place])   . Hypertension     Assessment: D#4 for RLL PNA Recently treated for sepsis/PNA, discharged on LVQ but never started tx Afebrile, WBC  11.1  Vanc 9/7 >> 9/10 Azactam 9/7 >> 9/10 Levofloxacin 9/10 >>   9/4 Strep pneumo / Legionella - negative 9/7 bcx ngtd 9/7 MRSA PCR pending  Goal of Therapy:  Eradication of infection   Plan:  Discontinue Vanc and Azactam, initiate Levofloxacin 500 mg PO q48h Monitor renal fxn, clinical progress, f/u GI side effects  Latanya Maudlin, PharmD Student 04/01/2018,8:19 AM   I discussed / reviewed the pharmacy note by Dr. Larena Glassman and I agree with the resident's findings and plans as documented. MD note says pt experience diarrhea with previous dose of Levaquin after last admission. Will need to monitor closely, but pt should not need more than 1-2 more doses to complete 7d course of therapy for PNA.  Virda Betters S. Alford Highland, PharmD, [place] Clinical Staff Pharmacist [phone]

## 2018-04-01 NOTE — Discharge Summary (Signed)
Physician Discharge Summary  Melissa Mcdaniel POE:[id number] DOB: Dec 17, 1932 DOA: 03/29/2018  PCP: Harlan Stains, MD  Admit date: 03/29/2018 Discharge date: 04/01/2018  Recommendations for Outpatient Follow-up:   Right lower lobe pneumonia, suspect component of acute diastolic CHF, doubt ACS --Consider repeat film in 4 weeks to ensure resolution of pneumonia and pleural effusion.   Follow-up Information    Harlan Stains, MD. Schedule an appointment as soon as possible for a visit in 1 week(s).   Specialty:  Family Medicine Contact information: [place] [address] [phone]        Burnell Blanks, MD .   Specialty:  Cardiology Contact information: [address] [place] [address] [phone]            Discharge Diagnoses:  1. Acute hypoxic respiratory failure secondary to right lower lobe pneumonia 2. Acute on chronic diastolic CHF 3. Demand ischemia. 4. Atrial fibrillation 5. AKI superimposed on CKD stage III suspected  Discharge Condition: improved Disposition: home  Diet recommendation: heart healthy low sodium  Filed Weights   03/30/18 0700 03/31/18 0500 04/01/18 0300  Weight: 47.3 kg 47.9 kg 47.9 kg    History of present illness:  69yow PMH afib, diastolic CHF, Crohn's disease just d/c 9/6 on Levaquin tx for sepsis, pneumonia PRESENTED with sudden SOB at 3 AM. Admitted for acute resp failure, RLL pneumonia, acute diastolic CHF.  Hospital Course:  Patient rapidly was weaned off BiPAP over few hours and did well with subsequent resolution of hypoxia.  Follow-up chest x-ray was without significant change she was treated with IV antibiotics with transition to doxycycline on discharge.  Most likely her symptoms were more related to acute on chronic diastolic CHF rather than worsening of pneumonia given her quick improvement.  Demand ischemia was noted but reassuring echocardiogram noted.  No further  evaluation suggested.  Other issues as below.  Acute hypoxic respiratory failure secondary to right lower lobe pneumonia, suspect component of acute diastolic CHF, doubt ACS --Hypoxia resolved.  No fever.    Repeat chest x-ray without significant change.  Expect resolution as an outpatient.  Consider repeat film in 4 weeks to ensure resolution.  Acute on chronic diastolic CHF. LVEF 50-55% with grade 2 diastolic dysfunction by echo February 2018.  Demand ischemia. --Troponin peaked.  EKG nonacute.  Completely asymptomatic.  No evidence of ACS.  Echocardiogram reassuring   Atrial fibrillation. Rate controlled. --Stable.  Continue amiodarone and beta-blocker  AKI superimposed on CKD stage III suspected compared to previous laboratory studies in 2018.  --AKI resolved.  Chronic normocytic anemia. Long-standing. Etiology  Antimicrobials:  Vancomycin 9/7 > 9/9  Aztreonam 9/7 >  doxycycline 9/9   Today's assessment: S: Slept well, feels well. Breathing fine. O: Vitals:  Vitals:   03/31/18 2300 04/01/18 0834  BP: (!) 156/62 130/65  Pulse: 88 82  Resp: 20 20  Temp: 98.9 F (37.2 C) 97.9 F (36.6 C)  SpO2: 95% 96%    Constitutional:  . Appears calm and comfortable Respiratory:  . CTA bilaterally except some crackles right anterior chest . Respiratory effort normal.  Cardiovascular:  . RRR, no m/r/g . No LE extremity edema   Psychiatric:  . judgement and insight appear normal . Mental status o Mood, affect appropriate  Creatinine better, 1.59.  Discharge Instructions  Discharge Instructions    Diet - low sodium heart healthy   Complete by:  As directed    Discharge instructions   Complete by:  As directed    Call your physician or seek immediate medical attention for fever, shortness of breath, pain, weakness or worsening of condition.   Increase activity slowly   Complete by:  As directed      Allergies as of 04/01/2018      Reactions   Penicillins  Swelling   Has patient had a PCN reaction causing immediate rash, facial/tongue/throat swelling, SOB or lightheadedness with hypotension: Yes Has patient had a PCN reaction causing severe rash involving mucus membranes or skin necrosis: No Has patient had a PCN reaction that required hospitalization: No Has patient had a PCN reaction occurring within the last 10 years: No If all of the above answers are "NO", then may proceed with Cephalosporin use.      Medication List    STOP taking these medications   levofloxacin 500 MG tablet Commonly known as:  LEVAQUIN     TAKE these medications   albuterol 1.25 MG/3ML nebulizer solution Commonly known as:  ACCUNEB Take 3 mLs (1.25 mg total) by nebulization every 6 (six) hours as needed for wheezing or shortness of breath.   alendronate 70 MG tablet Commonly known as:  FOSAMAX Take 70 mg by mouth once a week. Take with a full glass of water on an empty stomach. Saturdays   amiodarone 200 MG tablet Commonly known as:  PACERONE TAKE 1 TABLET DAILY   apixaban 2.5 MG Tabs tablet Commonly known as:  ELIQUIS Take 1 tablet (2.5 mg total) by mouth 2 (two) times daily.   carvedilol 3.125 MG tablet Commonly known as:  COREG Take 1 tablet (3.125 mg total) by mouth 2 (two) times daily.   clonazePAM 0.5 MG tablet Commonly known as:  KLONOPIN TK 1 T PO BID What changed:    how much to take  how to take this  when to take this  additional instructions   doxycycline 100 MG tablet Commonly known as:  VIBRA-TABS Take 1 tablet (100 mg total) by mouth every 12 (twelve) hours.   furosemide 20 MG tablet Commonly known as:  LASIX Take 1 tablet daily only as needed for weight gain What changed:    how much to take  how to take this  when to take this  reasons to take this  additional instructions   hydrALAZINE 25 MG tablet Commonly known as:  APRESOLINE Take 25 mg by mouth 2 (two) times daily.   Iron 28 MG Tabs Take 1 tablet by  mouth daily.   latanoprost 0.005 % ophthalmic solution Commonly known as:  XALATAN Place 1 drop into both eyes at bedtime.   loperamide 2 MG capsule Commonly known as:  IMODIUM Take 4 mg by mouth 4 (four) times daily as needed for diarrhea or loose stools.   mirtazapine 15 MG tablet Commonly known as:  REMERON Take 15 mg by mouth at bedtime.   potassium chloride 10 MEQ tablet Commonly known as:  K-DUR Take 1 tablet only when you take a Lasix. What changed:    how much to take  how to take this  when to take this  reasons to take this  additional instructions   ranitidine 150 MG tablet Commonly known as:  ZANTAC Take 150 mg by mouth daily as needed for heartburn.   venlafaxine XR 150 MG 24 hr capsule Commonly known as:  EFFEXOR-XR Take 150 mg by mouth 2 (two) times daily.   Vitamin D3 2000 units Tabs Take 2,000 Units by mouth daily.  Allergies  Allergen Reactions  . Penicillins Swelling    Has patient had a PCN reaction causing immediate rash, facial/tongue/throat swelling, SOB or lightheadedness with hypotension: Yes Has patient had a PCN reaction causing severe rash involving mucus membranes or skin necrosis: No Has patient had a PCN reaction that required hospitalization: No Has patient had a PCN reaction occurring within the last 10 years: No If all of the above answers are "NO", then may proceed with Cephalosporin use.     The results of significant diagnostics from this hospitalization (including imaging, microbiology, ancillary and laboratory) are listed below for reference.    Significant Diagnostic Studies: Dg Chest 2 View  Result Date: 03/31/2018 CLINICAL DATA:  Follow-up pneumonia EXAM: CHEST - 2 VIEW COMPARISON:  03/29/2018, 03/25/2018 chest x-ray, chest CT dated 02/05/2018 FINDINGS: Cardiac shadow is stable. Aortic calcifications are again seen. Lungs are well aerated with persistent right basilar infiltrate projecting in the right lower and  right middle lobes with small effusion. Multilevel compression deformities are seen in the thoracic spine stable from prior exams. IMPRESSION: Basilar infiltrate in the right middle and right lower lobes. The infiltrate is increased somewhat in the right lower lobe when compared with a prior chest x-ray from 03/25/2018 Electronically Signed   By: Inez Catalina M.D.   On: 03/31/2018 09:24   Dg Chest 2 View  Result Date: 03/25/2018 CLINICAL DATA:  Weakness. EXAM: CHEST - 2 VIEW COMPARISON:  Radiographs of February 13, 2018. FINDINGS: The heart size and mediastinal contours are within normal limits. Atherosclerosis of thoracic aorta is noted. No pneumothorax or significant pleural effusion is noted. Left lung is clear. Right middle lobe airspace opacity is noted consistent with pneumonia. The visualized skeletal structures are unremarkable. IMPRESSION: Right middle lobe pneumonia. Followup PA and lateral chest X-ray is recommended in 3-4 weeks following trial of antibiotic therapy to ensure resolution and exclude underlying malignancy. Aortic Atherosclerosis (ICD10-I70.0). Electronically Signed   By: Marijo Conception, M.D.   On: 03/25/2018 16:29   Dg Chest Port 1 View  Result Date: 03/29/2018 CLINICAL DATA:  Shortness of breath EXAM: PORTABLE CHEST 1 VIEW COMPARISON:  Chest radiograph 03/25/2018 FINDINGS: Shallow lung inflation with diffuse interstitial opacity and consolidation at the right lung base. Small right pleural effusion. Normal cardiomediastinal contours. Calcific aortic atherosclerosis. IMPRESSION: Worsening right basilar pneumonia with superimposed interstitial pulmonary edema and new right-sided pleural effusion. Electronically Signed   By: Ulyses Jarred M.D.   On: 03/29/2018 06:36    Microbiology: Recent Results (from the past 240 hour(s))  Blood Culture (routine x 2)     Status: None   Collection Time: 03/25/18  6:06 PM  Result Value Ref Range Status   Specimen Description BLOOD RIGHT  ANTECUBITAL  Final   Special Requests   Final    BOTTLES DRAWN AEROBIC AND ANAEROBIC Blood Culture adequate volume   Culture   Final    NO GROWTH 5 DAYS Performed at [facility], [address], [place], [place] [address]    Report Status 03/30/2018 FINAL  Final  Blood Culture (routine x 2)     Status: None   Collection Time: 03/25/18  6:10 PM  Result Value Ref Range Status   Specimen Description BLOOD LEFT WRIST  Final   Special Requests   Final    BOTTLES DRAWN AEROBIC AND ANAEROBIC Blood Culture results may not be optimal due to an inadequate volume of blood received in culture bottles   Culture   Final  NO GROWTH 5 DAYS Performed at [facility], [place] [address] [place], [place] [address]    Report Status 03/30/2018 FINAL  Final  Culture, blood (routine x 2) Call MD if unable to obtain prior to antibiotics being given     Status: None (Preliminary result)   Collection Time: 03/29/18  1:23 PM  Result Value Ref Range Status   Specimen Description BLOOD RIGHT HAND  Final   Special Requests   Final    BOTTLES DRAWN AEROBIC AND ANAEROBIC Blood Culture results may not be optimal due to an inadequate volume of blood received in culture bottles   Culture   Final    NO GROWTH 3 DAYS Performed at [facility], [place] [address], [place], [place] [address]    Report Status PENDING  Incomplete  Culture, blood (routine x 2) Call MD if unable to obtain prior to antibiotics being given     Status: None (Preliminary result)   Collection Time: 03/29/18  2:46 PM  Result Value Ref Range Status   Specimen Description BLOOD RIGHT ANTECUBITAL  Final   Special Requests   Final    BOTTLES DRAWN AEROBIC AND ANAEROBIC Blood Culture adequate volume   Culture   Final    NO GROWTH 3 DAYS Performed at [facility], [place] [address], [place], [place] [address]    Report Status PENDING  Incomplete     Labs: Basic Metabolic Panel: Recent Labs  Lab 03/28/18 0635 03/29/18 0622  03/29/18 2352 03/31/18 0251 04/01/18 0456  NA 135 136 133* 137 136  K 4.4 4.3 3.5 4.6 3.7  CL 107 105 101 102 102  CO2 21* 21* 20* 24 23  GLUCOSE 87 172* 177* 83 85  BUN 12 17 25* 34* 23  CREATININE 1.41* 1.57* 2.05* 2.18* 1.59*  CALCIUM 7.9* 8.2* 7.9* 8.2* 8.0*   CBC: Recent Labs  Lab 03/26/18 0618 03/29/18 0622  WBC 6.0 11.1*  NEUTROABS  --  9.8*  HGB 9.5* 8.9*  HCT 31.6* 30.5*  MCV 89.0 91.3  PLT 200 282   Cardiac Enzymes: Recent Labs  Lab 03/29/18 0622 03/29/18 1327 03/29/18 1831 03/29/18 2352  TROPONINI 0.16* 0.46* 0.27* 0.14*    Recent Labs    03/29/18 0627  BNP 367.9*     Principal Problem:   Acute respiratory failure with hypoxia (HCC) Active Problems:   A-fib (HCC)   Lobar pneumonia (HCC)   Acute on chronic diastolic CHF (congestive heart failure) (HCC)   Elevated troponin   CKD (chronic kidney disease), stage III (HCC)   Anemia   Time coordinating discharge: 25 minutes  Signed:  Murray Hodgkins, MD [facility] 04/01/2018, 5:08 PM

## 2018-04-01 NOTE — Care Management Important Message (Signed)
Important Message  Patient Details  Name: Melissa Mcdaniel MRN: [id number] Date of Birth: June 14, 1933   Medicare Important Message Given:  Yes    [name] 04/01/2018, 1:46 PM

## 2018-04-03 DIAGNOSIS — I5023 Acute on chronic systolic (congestive) heart failure: Secondary | ICD-10-CM | POA: Diagnosis not present

## 2018-04-03 DIAGNOSIS — J181 Lobar pneumonia, unspecified organism: Secondary | ICD-10-CM | POA: Diagnosis not present

## 2018-04-03 DIAGNOSIS — F419 Anxiety disorder, unspecified: Secondary | ICD-10-CM | POA: Diagnosis not present

## 2018-04-03 DIAGNOSIS — I959 Hypotension, unspecified: Secondary | ICD-10-CM | POA: Diagnosis not present

## 2018-04-03 DIAGNOSIS — K120 Recurrent oral aphthae: Secondary | ICD-10-CM | POA: Diagnosis not present

## 2018-04-03 DIAGNOSIS — N179 Acute kidney failure, unspecified: Secondary | ICD-10-CM | POA: Diagnosis not present

## 2018-04-03 LAB — CULTURE, BLOOD (ROUTINE X 2)
CULTURE: NO GROWTH
Culture: NO GROWTH
SPECIAL REQUESTS: ADEQUATE

## 2018-04-16 ENCOUNTER — Ambulatory Visit (INDEPENDENT_AMBULATORY_CARE_PROVIDER_SITE_OTHER): Payer: Medicare Other | Admitting: Cardiovascular Disease

## 2018-04-16 ENCOUNTER — Ambulatory Visit
Admission: RE | Admit: 2018-04-16 | Discharge: 2018-04-16 | Disposition: A | Discharge status: Home / Self Care | Payer: Medicare Other | Source: Ambulatory Visit | Attending: Family Medicine | Admitting: Family Medicine

## 2018-04-16 ENCOUNTER — Encounter: Payer: Self-pay | Admitting: Cardiovascular Disease

## 2018-04-16 ENCOUNTER — Other Ambulatory Visit: Payer: Self-pay | Admitting: Family Medicine

## 2018-04-16 VITALS — BP 132/60 | HR 90 | Ht 60.0 in | Wt 99.1 lb

## 2018-04-16 DIAGNOSIS — J189 Pneumonia, unspecified organism: Secondary | ICD-10-CM

## 2018-04-16 DIAGNOSIS — I480 Paroxysmal atrial fibrillation: Secondary | ICD-10-CM

## 2018-04-16 DIAGNOSIS — I428 Other cardiomyopathies: Secondary | ICD-10-CM

## 2018-04-16 DIAGNOSIS — I10 Essential (primary) hypertension: Secondary | ICD-10-CM | POA: Diagnosis not present

## 2018-04-16 DIAGNOSIS — I2510 Atherosclerotic heart disease of native coronary artery without angina pectoris: Secondary | ICD-10-CM | POA: Diagnosis not present

## 2018-04-16 DIAGNOSIS — I5042 Chronic combined systolic (congestive) and diastolic (congestive) heart failure: Secondary | ICD-10-CM

## 2018-04-16 DIAGNOSIS — J181 Lobar pneumonia, unspecified organism: Principal | ICD-10-CM

## 2018-04-16 NOTE — Patient Instructions (Signed)

## 2018-04-16 NOTE — Progress Notes (Signed)
Chief Complaint  Patient presents with  . Follow-up    Diastolic CHF     History of Present Illness: 84 yo female with history of HTN, CAD, chronic combined diastolic and systolic CHF, persistent atrial fibrillation, Crohn's disease, colon cancer who is here today for cardiac follow up. I saw her as a new patient in February 2018. She had recently moved from [facility] to [place] to be near her daughter. She is known to have atrial fibrillation. She has been on Eliquis. Records indicate that she had DCCV in December 2015 and was started on amiodarone at that time. Echo in November 2017 at [facility] showed LVEF=35-40% with global HK, moderate MR, mild to moderate TR. She had a cardiac cath in 2002 showing 30% ostial RCA stenosis. She has had colon cancer and has had a colon resection. No evidence of carotid disease by dopplers 2010. Stress test in 2012 did not show ischemia. I saw her in December 2018 with c/o dyspnea but no cough, fever, sick contacts or LE edema. Chest x-ray was clear. Echo 07/05/17 with LVEF=50-55%. Mild AI and mild MR.  D-dimer was slightly elevated. Chest CTA without evidence of PE. Her BNP was elevated so Lasix increased. Her dyspnea resolved but renal function worsened with higher doses of Lasix. She has been using Lasix as needed since then. She was admitted to [facility] 03/29/18 with respiratory distress with likely acute diastolic CHF and possible pneumonia. She was diuresed and improved rapidly. Mild troponin elevation but no chest pain. Echo 03/31/18 with LVEF=55-60% with no wall motion abnormalilties, grade 2 diastolic dysfunction. There was mild AI  She is here today for follow up. The patient denies any chest pain, dyspnea, palpitations, lower extremity edema, orthopnea, PND, dizziness, near syncope or syncope. She has no energy. She feels tired all day. She has been weighing every morning and weight has been stable. Creatinine 1.9 at Dr. Orest Dikes office 04/04/18. She is using  Lasix if her weight goes up. She has no appetite. She is trying to use supplemental shakes.    Primary Care Physician: Harlan Stains, MD  Past Medical History:  Diagnosis Date  . A-fib ([place])   . Congestive heart failure (CHF) ([place]) 05/2016  . Crohn's disease ([place])   . Dysrhythmia    afib dx approx. 2016 per pt  . Hip fracture ([place])   . Hypertension     Past Surgical History:  Procedure Laterality Date  . FEMUR IM NAIL Right 07/26/2016   Procedure: INTRAMEDULLARY (IM) NAIL FEMORAL;  Surgeon: Renette Butters, MD;  Location: [facility];  Service: Orthopedics;  Laterality: Right;  . FRACTURE SURGERY    . large intestine - partial removal  approx 1993   due to crohns    Current Outpatient Medications  Medication Sig Dispense Refill  . albuterol (ACCUNEB) 1.25 MG/3ML nebulizer solution Take 3 mLs (1.25 mg total) by nebulization every 6 (six) hours as needed for wheezing or shortness of breath. 75 mL 0  . alendronate (FOSAMAX) 70 MG tablet Take 70 mg by mouth once a week. Take with a full glass of water on an empty stomach. Saturdays    . amiodarone (PACERONE) 200 MG tablet Take 200 mg by mouth daily.    Marland Kitchen apixaban (ELIQUIS) 2.5 MG TABS tablet Take 1 tablet (2.5 mg total) by mouth 2 (two) times daily. 20 tablet 0  . carvedilol (COREG) 3.125 MG tablet Take 1 tablet (3.125 mg total) by mouth 2 (two) times daily. Godfrey  tablet 6  . Cholecalciferol (VITAMIN D3) 2000 units TABS Take 2,000 Units by mouth daily.     . clonazePAM (KLONOPIN) 1 MG tablet Take 0.5 mg by mouth 2 (two) times daily. .5 mg twice a day as needed    . Ferrous Sulfate (IRON) 28 MG TABS Take 1 tablet by mouth daily.    . furosemide (LASIX) 20 MG tablet Take 20 mg by mouth daily as needed.    . hydrALAZINE (APRESOLINE) 25 MG tablet Take 12.5 mg by mouth 2 (two) times daily.     Marland Kitchen latanoprost (XALATAN) 0.005 % ophthalmic solution Place 1 drop into both eyes at bedtime.    Marland Kitchen loperamide (IMODIUM) 2 MG capsule Take 4 mg by mouth 4  (four) times daily as needed for diarrhea or loose stools.    . mirtazapine (REMERON) 15 MG tablet Take 15 mg by mouth at bedtime.     . potassium chloride (K-DUR) 10 MEQ tablet Take 10 mEq by mouth daily as needed. Take with lasix as needed for weight gain.    . ranitidine (ZANTAC) 150 MG tablet Take 150 mg by mouth daily as needed for heartburn.    . venlafaxine XR (EFFEXOR-XR) 150 MG 24 hr capsule Take 150 mg by mouth 2 (two) times daily.     No current facility-administered medications for this visit.     Allergies  Allergen Reactions  . Penicillins Swelling    Has patient had a PCN reaction causing immediate rash, facial/tongue/throat swelling, SOB or lightheadedness with hypotension: Yes Has patient had a PCN reaction causing severe rash involving mucus membranes or skin necrosis: No Has patient had a PCN reaction that required hospitalization: No Has patient had a PCN reaction occurring within the last 10 years: No If all of the above answers are "NO", then may proceed with Cephalosporin use.     Social History   Socioeconomic History  . Marital status: Married    Spouse name: Not on file  . Number of children: 3  . Years of education: Not on file  . Highest education level: Not on file  Occupational History  . Occupation: Worked at [employer]  . Financial resource strain: Not on file  . Food insecurity:    Worry: Not on file    Inability: Not on file  . Transportation needs:    Medical: Not on file    Non-medical: Not on file  Tobacco Use  . Smoking status: Former Smoker    Packs/day: 0.50    Years: 30.00    Pack years: 15.00    Types: Cigarettes    Last attempt to quit: 09/15/1975    Years since quitting: 42.6  . Smokeless tobacco: Never Used  Substance and Sexual Activity  . Alcohol use: No  . Drug use: No  . Sexual activity: Not on file  Lifestyle  . Physical activity:    Days per week: Not on file    Minutes per session: Not on file    . Stress: Not on file  Relationships  . Social connections:    Talks on phone: Not on file    Gets together: Not on file    Attends religious service: Not on file    Active member of club or organization: Not on file    Attends meetings of clubs or organizations: Not on file    Relationship status: Not on file  . Intimate partner violence:    Fear of current or ex  partner: Not on file    Emotionally abused: Not on file    Physically abused: Not on file    Forced sexual activity: Not on file  Other Topics Concern  . Not on file  Social History Narrative  . Not on file    Family History  Problem Relation Age of Onset  . Lung cancer Mother   . Alzheimer's disease Father   . Diabetes Maternal Grandmother     Review of Systems:  As stated in the HPI and otherwise negative.   BP 132/60   Pulse 90   Ht 5' (1.524 m)   Wt 99 lb 1.9 oz (45 kg)   SpO2 94%   BMI 19.36 kg/m   Physical Examination:  General: Well developed, well nourished, NAD  HEENT: OP clear, mucus membranes moist  SKIN: warm, dry. No rashes. Neuro: No focal deficits  Musculoskeletal: Muscle strength 5/5 all ext  Psychiatric: Mood and affect normal  Neck: No JVD, no carotid bruits, no thyromegaly, no lymphadenopathy.  Lungs:Clear bilaterally, no wheezes, rhonci, crackles Cardiovascular: Regular rate and rhythm. No murmurs, gallops or rubs. Abdomen:Soft. Bowel sounds present. Non-tender.  Extremities: No lower extremity edema. Pulses are 2 + in the bilateral DP/PT.  Echo 03/31/18: Left ventricle: The cavity size was normal. There was mild   concentric hypertrophy. Systolic function was normal. The   estimated ejection fraction was in the range of 55% to 60%. Wall   motion was normal; there were no regional wall motion   abnormalities. Features are consistent with a pseudonormal left   ventricular filling pattern, with concomitant abnormal relaxation   and increased filling pressure (grade 2 diastolic  dysfunction).   Doppler parameters are consistent with high ventricular filling   pressure. - Aortic valve: Transvalvular velocity was within the normal range.   There was no stenosis. There was mild regurgitation. Valve area   (VTI): 1.45 cm^2. Valve area (Vmax): 1.56 cm^2. Valve area   (Vmean): 1.53 cm^2. - Mitral valve: Transvalvular velocity was within the normal range.   There was no evidence for stenosis. There was trivial   regurgitation. - Left atrium: The atrium was severely dilated. - Right ventricle: The cavity size was normal. Wall thickness was   normal. Systolic function was normal. - Atrial septum: No defect or patent foramen ovale was identified   by color flow Doppler. - Tricuspid valve: There was mild regurgitation. - Pulmonary arteries: Systolic pressure was within the normal   range. PA peak pressure: 35 mm Hg (S).  EKG:  EKG is not ordered today. The ekg ordered today demonstrates    Recent Labs: 07/04/2017: ALT 12; TSH 5.560 08/20/2017: NT-Pro BNP 3,103 03/29/2018: B Natriuretic Peptide 367.9; Hemoglobin 8.9; Platelets 282 04/01/2018: BUN 23; Creatinine, Ser 1.59; Potassium 3.7; Sodium 136   Lipid Panel No results found for: CHOL, TRIG, HDL, CHOLHDL, VLDL, LDLCALC, LDLDIRECT   Wt Readings from Last 3 Encounters:  04/16/18 99 lb 1.9 oz (45 kg)  04/01/18 105 lb 9.6 oz (47.9 kg)  03/27/18 101 lb 6.6 oz (46 kg)     Other studies Reviewed: Additional studies/ records that were reviewed today include: all old records. Review of the above records demonstrates:   Assessment and Plan:   1. Atrial fibrillation,paroxysmal: Sinus today. Will continue Coreg, amiodarone and Eliquis. She has yearly eye exams. Labs up to date.   2. Chronic combined systolic and diastolic CHF:  Weight is stable. No evidence of volume overload. Will continue to use Lasix  as needed. She has been weighing at home every morning and weight has been stable since discharge.   3. HTN: BP is  controlled. No changes.   4. CAD without angina: Mild CAD by cath in 2002. She has no chest pain worrisome for angina. Will continue the beta blocker. NO ASA since she is on Eliquis.  She has never taken a statin.   5. Non-ischemic cardiomyopathy: LVEF=55-60% by echo September 2019. Continue beta blocker and hydralazine. No Aceinh/ARB due to renal insufficiency.    Current medicines are reviewed at length with the patient today.  The patient does not have concerns regarding medicines.  The following changes have been made:  no change  Labs/ tests ordered today include:   No orders of the defined types were placed in this encounter.  Disposition:   FU with me or my care team APP in 6 months   Signed, Lauree Chandler, MD 04/16/2018 10:44 AM    [address], [place], [place]  [address] Phone: [phone]; Fax: [phone]

## 2018-05-08 DIAGNOSIS — N183 Chronic kidney disease, stage 3 (moderate): Secondary | ICD-10-CM | POA: Diagnosis not present

## 2018-05-08 DIAGNOSIS — F339 Major depressive disorder, recurrent, unspecified: Secondary | ICD-10-CM | POA: Diagnosis not present

## 2018-05-08 DIAGNOSIS — D638 Anemia in other chronic diseases classified elsewhere: Secondary | ICD-10-CM | POA: Diagnosis not present

## 2018-05-08 DIAGNOSIS — M810 Age-related osteoporosis without current pathological fracture: Secondary | ICD-10-CM | POA: Diagnosis not present

## 2018-05-08 DIAGNOSIS — E441 Mild protein-calorie malnutrition: Secondary | ICD-10-CM | POA: Diagnosis not present

## 2018-05-08 DIAGNOSIS — R946 Abnormal results of thyroid function studies: Secondary | ICD-10-CM | POA: Diagnosis not present

## 2018-05-08 DIAGNOSIS — I129 Hypertensive chronic kidney disease with stage 1 through stage 4 chronic kidney disease, or unspecified chronic kidney disease: Secondary | ICD-10-CM | POA: Diagnosis not present

## 2018-05-08 DIAGNOSIS — Z0000 Encounter for general adult medical examination without abnormal findings: Secondary | ICD-10-CM | POA: Diagnosis not present

## 2018-05-15 ENCOUNTER — Other Ambulatory Visit: Payer: Self-pay | Admitting: Cardiovascular Disease

## 2018-05-15 NOTE — Telephone Encounter (Signed)
Eliquis 2.22m refill request received; pt is 84yrs old, wt-45kg, Crea-1.59 on 04/01/18, last seen by Dr. MAngelena Formon 04/16/18; will send in refill to requested pharmacy.

## 2018-05-21 ENCOUNTER — Ambulatory Visit
Admission: RE | Admit: 2018-05-21 | Discharge: 2018-05-21 | Disposition: A | Discharge status: Home / Self Care | Payer: Medicare Other | Source: Ambulatory Visit | Attending: Family Medicine | Admitting: Family Medicine

## 2018-05-21 ENCOUNTER — Other Ambulatory Visit: Payer: Self-pay | Admitting: Family Medicine

## 2018-05-21 DIAGNOSIS — J189 Pneumonia, unspecified organism: Secondary | ICD-10-CM

## 2018-05-21 DIAGNOSIS — J181 Lobar pneumonia, unspecified organism: Principal | ICD-10-CM

## 2018-06-20 DIAGNOSIS — I10 Essential (primary) hypertension: Secondary | ICD-10-CM | POA: Diagnosis not present

## 2018-06-20 DIAGNOSIS — Y9389 Activity, other specified: Secondary | ICD-10-CM | POA: Diagnosis not present

## 2018-06-20 DIAGNOSIS — W19XXXA Unspecified fall, initial encounter: Secondary | ICD-10-CM | POA: Diagnosis not present

## 2018-06-20 DIAGNOSIS — S8001XA Contusion of right knee, initial encounter: Secondary | ICD-10-CM | POA: Diagnosis not present

## 2018-06-20 DIAGNOSIS — M25561 Pain in right knee: Secondary | ICD-10-CM | POA: Diagnosis not present

## 2018-06-20 DIAGNOSIS — Y9289 Other specified places as the place of occurrence of the external cause: Secondary | ICD-10-CM | POA: Diagnosis not present

## 2018-06-20 DIAGNOSIS — T1490XA Injury, unspecified, initial encounter: Secondary | ICD-10-CM | POA: Diagnosis not present

## 2018-06-30 DIAGNOSIS — T23251A Burn of second degree of right palm, initial encounter: Secondary | ICD-10-CM | POA: Diagnosis not present

## 2018-07-03 ENCOUNTER — Encounter: Payer: Self-pay | Admitting: Pulmonary Disease

## 2018-07-03 ENCOUNTER — Ambulatory Visit (INDEPENDENT_AMBULATORY_CARE_PROVIDER_SITE_OTHER): Payer: Medicare Other | Admitting: Pulmonary Disease

## 2018-07-03 ENCOUNTER — Telehealth: Payer: Self-pay

## 2018-07-03 VITALS — BP 140/50 | HR 65 | Ht <= 58 in | Wt 100.6 lb

## 2018-07-03 DIAGNOSIS — R9389 Abnormal findings on diagnostic imaging of other specified body structures: Secondary | ICD-10-CM | POA: Diagnosis not present

## 2018-07-03 DIAGNOSIS — N189 Chronic kidney disease, unspecified: Secondary | ICD-10-CM

## 2018-07-03 LAB — BASIC METABOLIC PANEL
BUN: 26 mg/dL — ABNORMAL HIGH (ref 6–23)
CO2: 28 meq/L (ref 19–32)
Calcium: 8.8 mg/dL (ref 8.4–10.5)
Chloride: 105 mEq/L (ref 96–112)
Creatinine, Ser: 1.59 mg/dL — ABNORMAL HIGH (ref 0.40–1.20)
GFR: 32.93 mL/min — ABNORMAL LOW (ref >60.00)
GLUCOSE: 92 mg/dL (ref 70–99)
POTASSIUM: 5.9 meq/L — AB (ref 3.5–5.1)
SODIUM: 137 meq/L (ref 135–145)

## 2018-07-03 NOTE — Progress Notes (Signed)
Synopsis: Referred in 06/2018 for abnormal CXR  Subjective:   PATIENT ID: Melissa Mcdaniel GENDER: female DOB: 04-25-1933, MRN: [id number]   HPI  Chief Complaint  Patient presents with  . consult    spot on lung after recent pneumonia Sept 2019.  remains a little more SOB than prior to the pneumonia    Melissa Mcdaniel is a 84 year old female former smoker (15 pack years) with history of chronic diastolic heart failure, atrial fibrillation, hypertension, CKD, depression and recent pneumonia who presents as a new consult for abnormal CXR.  Records including PCP note from [facility] on 05/08/18 and 04/03/2018 reviewed and summarized: Patient hospitalized from 9/3 to 9/7 and 9/7 to 9/10.  Failed outpatient treatment for bronchitis.  Presented to ED with shortness of breath and diagnosed with right middle lobe pneumonia and treated with IV fluids, IV Levaquin.  Discharge summary on 04/01/2018 reviewed: Patient admitted for respiratory failure secondary to pneumonia and acute on chronic diastolic heart failure.  Treated with BiPAP and IV antibiotics which were de-escalated to doxycycline.  Since discharge, she reports her bronchitis has resolved. Denies any shortness of breath, chest pain, cough, leg swelling. Denies recent fevers, chills. Reports 7 lbs weight loss that she attributes to poor appetite. Her primary concern is the abnormal CXR demonstrating a spot on her right lung.  Former smoker.  15 pack years.  Quit in 1977 Environmental exposures: None  I have personally reviewed patient's past medical/family/social history, allergies, current medications.  Past Medical History:  Diagnosis Date  . A-fib ([place])   . Chronic kidney disease   . Congestive heart failure (CHF) ([place]) 05/2016  . Crohn's disease ([place])   . Dysrhythmia    afib dx approx. 2016 per pt  . Hip fracture ([place])   . Hypertension      Family History  Problem Relation Age of Onset  . Lung cancer Mother    . Alzheimer's disease Father   . Diabetes Maternal Grandmother      Social History   Occupational History  . Occupation: Worked at [employer]  . Smoking status: Former Smoker    Packs/day: 0.50    Years: 30.00    Pack years: 15.00    Types: Cigarettes    Last attempt to quit: 09/15/1975    Years since quitting: 42.8  . Smokeless tobacco: Never Used  Substance and Sexual Activity  . Alcohol use: No  . Drug use: No  . Sexual activity: Not on file    Allergies  Allergen Reactions  . Penicillins Swelling    Has patient had a PCN reaction causing immediate rash, facial/tongue/throat swelling, SOB or lightheadedness with hypotension: Yes Has patient had a PCN reaction causing severe rash involving mucus membranes or skin necrosis: No Has patient had a PCN reaction that required hospitalization: No Has patient had a PCN reaction occurring within the last 10 years: No If all of the above answers are "NO", then may proceed with Cephalosporin use.      Outpatient Medications Prior to Visit  Medication Sig Dispense Refill  . alendronate (FOSAMAX) 70 MG tablet Take 70 mg by mouth once a week. Take with a full glass of water on an empty stomach. Saturdays    . amiodarone (PACERONE) 200 MG tablet Take 200 mg by mouth daily.    . carvedilol (COREG) 3.125 MG tablet Take 1 tablet (3.125 mg total) by mouth 2 (two) times daily. (Patient taking differently: Take 6.25 mg by  mouth 2 (two) times daily. ) 60 tablet 6  . Cholecalciferol (VITAMIN D3) 2000 units TABS Take 2,000 Units by mouth daily.     . clonazePAM (KLONOPIN) 1 MG tablet Take 0.5 mg by mouth 2 (two) times daily. .5 mg twice a day as needed    . ELIQUIS 2.5 MG TABS tablet TAKE 1 TABLET TWICE DAILY 180 tablet 2  . Ferrous Sulfate (IRON) 28 MG TABS Take 1 tablet by mouth daily.    . furosemide (LASIX) 20 MG tablet Take 20 mg by mouth daily as needed.    . hydrALAZINE (APRESOLINE) 25 MG tablet Take 12.5 mg by mouth 2  (two) times daily.     Marland Kitchen latanoprost (XALATAN) 0.005 % ophthalmic solution Place 1 drop into both eyes at bedtime.    Marland Kitchen loperamide (IMODIUM) 2 MG capsule Take 4 mg by mouth 4 (four) times daily as needed for diarrhea or loose stools.    . mirtazapine (REMERON) 15 MG tablet Take 15 mg by mouth at bedtime.     . potassium chloride (K-DUR) 10 MEQ tablet Take 10 mEq by mouth daily as needed. Take with lasix as needed for weight gain.    . ranitidine (ZANTAC) 150 MG tablet Take 150 mg by mouth daily as needed for heartburn.    . venlafaxine XR (EFFEXOR-XR) 150 MG 24 hr capsule Take 150 mg by mouth 2 (two) times daily.    Marland Kitchen albuterol (ACCUNEB) 1.25 MG/3ML nebulizer solution Take 3 mLs (1.25 mg total) by nebulization every 6 (six) hours as needed for wheezing or shortness of breath. (Patient not taking: Reported on 07/03/2018) 75 mL 0   No facility-administered medications prior to visit.     Review of Systems  Constitutional: Negative for chills, diaphoresis, fever, malaise/fatigue and weight loss.  HENT: Negative for congestion and sore throat.   Respiratory: Negative for cough, hemoptysis, sputum production, shortness of breath and wheezing.   Cardiovascular: Negative for chest pain, orthopnea, leg swelling and PND.  Gastrointestinal: Negative for abdominal pain, heartburn and nausea.  Genitourinary: Negative for frequency.  Musculoskeletal: Negative for myalgias.  Skin: Negative for rash.  Neurological: Negative for dizziness, weakness and headaches.  Endo/Heme/Allergies: Does not bruise/bleed easily.  Psychiatric/Behavioral: The patient is nervous/anxious.      Objective:  Physical Exam   Vitals:   07/03/18 1120  BP: (!) 140/50  Pulse: 65  SpO2: 97%  Weight: 100 lb 9.6 oz (45.6 kg)  Height: 4' 9.5" (1.461 m)   SpO2: 97 % O2 Device: None (Room air)  Physical Exam: General: Well-appearing, no acute distress HENT: [place], AT, OP clear, MMM Eyes: EOMI, no scleral  icterus Respiratory: Clear to auscultation bilaterally.  No crackles, wheezing or rales Cardiovascular: RRR, -M/R/G, no JVD GI: BS+, soft, nontender Extremities:-Edema,-tenderness Neuro: AAO x4, CNII-XII grossly intact Skin: Intact, no rashes or bruising Psych: Anxious, normal affect   Chest imaging: CXR imaging since 07/2016 demonstrates persistent RML opacity seen on recent imaging 05/21/18.  PFT: None on file  I have personally reviewed the above labs, images and tests noted above.    Assessment & Plan:    Discussion: 84 year old female former smoker (15 pack years) with HTN, atrial fibrillation, chronic diastolic heart failure and CKD who presents as a new consult for persistent RML opacity on CXR. This may represent chronic process as abnormality is noted on chest imaging since 07/2016. Will further evaluate with CT Chest with contrast. Discussed risks and benefits of imaging in the setting  of known CKD. Patient expressed understanding and wishes to pursue imaging.  #Abnormal Chest X-Ray --ORDER Chest CT with contrast to evaluate persistent right middle lobe abnormality  Follow-up with me in 1 month  Orders Placed This Encounter  Procedures  . CT Chest W Contrast    Patient has history of CKD  BMP ordered    Standing Status:   Future    Standing Expiration Date:   09/04/2019    Order Specific Question:   ** REASON FOR EXAM (FREE TEXT)    Answer:   abnormal CXR - right middle lobe abnormality    Order Specific Question:   If indicated for the ordered procedure, I authorize the administration of contrast media per Radiology protocol    Answer:   Yes    Order Specific Question:   Preferred imaging location?    Answer:   [facility]    Order Specific Question:   Radiology Contrast Protocol - do NOT remove file path    Answer:   \\[email or web address].pdf  . Basic Metabolic Panel (BMET)    Standing Status:   Future    Number of Occurrences:   1     Standing Expiration Date:   07/03/2019  No orders of the defined types were placed in this encounter.   Return in about 1 month (around 08/03/2018).   Thank you for choosing [facility] for your health needs!   Mahmoud Blazejewski Rodman Pickle, MD [facility] 07/03/2018 9:35 PM  Personal pager: [phone] If unanswered, please page CCM On-call: [phone]

## 2018-07-03 NOTE — Patient Instructions (Signed)
#  Abnormal Chest X-Ray --ORDER Chest CT with contrast to evaluate persistent right middle lobe abnormality  Follow-up with me in 1 month

## 2018-07-03 NOTE — Telephone Encounter (Signed)
Patient requested we call her re:BMP result today.  No results available at this time and we call her tomorrow when we get the report.  Patient agreed.  Nothing further needed until results are in.

## 2018-07-03 NOTE — Progress Notes (Signed)
BMP returned with elevated potassium. Called patient at home and spoke with husband. Patient is currently asymptomatic. I advised repeat BMP in the morning. Also advised patient to be urgently evaluated in ED if she developed symptoms of chest pain, palpitations or shortness of breath. Repeat BMP has been ordered and patient and husband aware.

## 2018-07-03 NOTE — Addendum Note (Signed)
Addended by: Rodman Pickle on: 07/03/2018 09:58 PM   Modules accepted: Orders

## 2018-07-04 ENCOUNTER — Other Ambulatory Visit: Payer: Self-pay

## 2018-07-04 DIAGNOSIS — M25561 Pain in right knee: Secondary | ICD-10-CM | POA: Diagnosis not present

## 2018-07-04 DIAGNOSIS — N189 Chronic kidney disease, unspecified: Secondary | ICD-10-CM

## 2018-07-04 NOTE — Progress Notes (Signed)
B

## 2018-07-22 ENCOUNTER — Telehealth: Payer: Self-pay | Admitting: Pulmonary Disease

## 2018-07-22 NOTE — Telephone Encounter (Signed)
We will have to reschedule CT. I will discuss it with Dr. Loanne Drilling.

## 2018-07-22 NOTE — Telephone Encounter (Signed)
Spoke with Erline Levine at [facility]. Patient is currently scheduled to have a CT w/contrast on 07/24/17. Patient recently had labwork done and her creatinine was at 30. Patient has a history of chronic kidney disease. She wants to know if it is safe to proceed with the CT scan with contrast.   Kenney Houseman, please advise since Dr. Loanne Drilling is not here today. Thanks!

## 2018-07-22 NOTE — Telephone Encounter (Signed)
OK for CT Chest without contrast.

## 2018-07-22 NOTE — Telephone Encounter (Signed)
Spoke with Erline Levine. She is aware of TN's recommendations. She wants to know if Dr. Loanne Drilling would like to have a CT without contrast on Thursday instead. She doesn't want to cancel patient's appt and inconvenience her. Advised her that Dr. Loanne Drilling is working in the hospital this week and I would send this message to her, she verbalized understanding.    Dr. Loanne Drilling, please advise on the CT. Thanks!

## 2018-07-24 ENCOUNTER — Ambulatory Visit (INDEPENDENT_AMBULATORY_CARE_PROVIDER_SITE_OTHER)
Admission: RE | Admit: 2018-07-24 | Discharge: 2018-07-24 | Disposition: A | Discharge status: Home / Self Care | Payer: Medicare Other | Source: Ambulatory Visit | Attending: Pulmonary Disease | Admitting: Pulmonary Disease

## 2018-07-24 DIAGNOSIS — R9389 Abnormal findings on diagnostic imaging of other specified body structures: Secondary | ICD-10-CM | POA: Diagnosis not present

## 2018-07-24 DIAGNOSIS — J984 Other disorders of lung: Secondary | ICD-10-CM | POA: Diagnosis not present

## 2018-07-24 NOTE — Telephone Encounter (Signed)
Spoke with Erline Levine. She is aware of the order change. A new order is not needed. She will call the patient to let her know. Nothing further needed at time of call.

## 2018-07-29 ENCOUNTER — Telehealth: Payer: Self-pay | Admitting: Pulmonary Disease

## 2018-07-29 NOTE — Telephone Encounter (Signed)
Called and spoke with patient, advised that we do not have those results. Advised I would send a message over to the physician. Dr. Loanne Drilling please advise on results. Thank you.

## 2018-07-29 NOTE — Telephone Encounter (Signed)
Please contact patient that CT Chest from 1/2 has been reviewed. Right side of her lung shows evidence of scarring from a prior infection. It also shows active inflammation in the right lower part of her lung that may be represent a possible chronic infection. Please advise her to keep follow-up appointment with me as we will need to discuss additional testing (eg, bronchoscopy vs further imaging).

## 2018-07-30 ENCOUNTER — Telehealth: Payer: Self-pay | Admitting: Pulmonary Disease

## 2018-07-30 NOTE — Telephone Encounter (Signed)
LMTCB.   Per JE: Please contact patient that CT Chest from 1/2 has been reviewed. Right side of her lung shows evidence of scarring from a prior infection. It also shows active inflammation in the right lower part of her lung that may be represent a possible chronic infection. Please advise her to keep follow-up appointment with me as we will need to discuss additional testing (eg, bronchoscopy vs further imaging).

## 2018-07-30 NOTE — Telephone Encounter (Signed)
Melissa Mcdaniel, [place]       12/23[birth year]76 58:85 AM  Note    Call made to patient, made aware of results. Voiced understanding. Confirmed appt 01/16 at 145pm.   Per OY:DXAJOI contact patient that CT Chest from 1/2 has been reviewed. Right side of her lung shows evidence of scarring from a prior infection. It also shows active inflammation in the right lower part of her lung that may be represent a possible chronic infection. Please advise her to keep follow-up appointment with me as we will need to discuss additional testing (eg, bronchoscopy vs further imaging).  Nothing further needed at this time.      __________________________________________________________________________________________  Hulen Skains and spoke with patient, went over results again. She is aware and verbalized understanding. Nothing further needed.

## 2018-07-30 NOTE — Telephone Encounter (Signed)
Called patient, unable to reach. Left message to give [place] a call back.

## 2018-07-30 NOTE — Telephone Encounter (Signed)
Call made to patient, made aware of results. Voiced understanding. Confirmed appt 01/16 at 145pm.   Per JE: Please contact patient that CT Chest from 1/2 has been reviewed. Right side of her lung shows evidence of scarring from a prior infection. It also shows active inflammation in the right lower part of her lung that may be represent a possible chronic infection. Please advise her to keep follow-up appointment with me as we will need to discuss additional testing (eg, bronchoscopy vs further imaging).  Nothing further needed at this time.

## 2018-08-01 DIAGNOSIS — M25561 Pain in right knee: Secondary | ICD-10-CM | POA: Diagnosis not present

## 2018-08-07 ENCOUNTER — Ambulatory Visit (INDEPENDENT_AMBULATORY_CARE_PROVIDER_SITE_OTHER): Payer: Medicare Other | Admitting: Pulmonary Disease

## 2018-08-07 ENCOUNTER — Encounter: Payer: Self-pay | Admitting: Pulmonary Disease

## 2018-08-07 VITALS — BP 168/66 | HR 74 | Wt 101.8 lb

## 2018-08-07 DIAGNOSIS — R9389 Abnormal findings on diagnostic imaging of other specified body structures: Secondary | ICD-10-CM

## 2018-08-07 NOTE — Patient Instructions (Addendum)
Referred for Abnormal CXR After reviewing you chest imaging, the abnormalities seen on your CXR and Chest CT are consistent with scarring from prior infection. There are some changes related to bronchitis but I would not recommend treating this unless you are having symptoms. There are a few lung nodules <68m in size. I would recommend repeat CT in 6 months (July) monitor for changes. This can be ordered by your primary care physician.  Follow-up with me as needed

## 2018-08-07 NOTE — Progress Notes (Signed)
Synopsis: Referred in 06/2018 for abnormal CXR  Subjective:   PATIENT ID: Melissa Mcdaniel GENDER: female DOB: 1932/10/18, MRN: [id number]   HPI  Chief Complaint  Patient presents with  . Follow-up    improved -able to climb stairs and do home activitiew without noticeable SOB    Ms. Melissa Mcdaniel is a 85 year old female former smoker (15 pack years) with history of chronic diastolic heart failure, atrial fibrillation, hypertension, CKD, depression and recent pneumonia who presents for follow-up.  Of note, patient previously hospitalized from 9/3 to 9/7 and 9/7 to 9/10 for right middle lobe pneumonia and treated with IV fluids, IV Levaquin. She is currently asymptomatic and denies any fevers, chills, chest pain, shortness of breath, cough or wheezing. She expresses worry about her CT scan and would like to review it again. On initial review of scan, there was discussion in our telephone encounters concerning need for bronchoscopy however she is currently asymptomatic. Denies fever, chills, chest pain, shortness of breath, cough or wheezing.  Former smoker.  15 pack years.  Quit in 1977 Environmental exposures: None  I have personally reviewed patient's past medical/family/social history, allergies, current medications.  Past Medical History:  Diagnosis Date  . A-fib ([place])   . Chronic kidney disease   . Congestive heart failure (CHF) ([place]) 05/2016  . Crohn's disease ([place])   . Dysrhythmia    afib dx approx. 2016 per pt  . Hip fracture ([place])   . Hypertension      Family History  Problem Relation Age of Onset  . Lung cancer Mother   . Alzheimer's disease Father   . Diabetes Maternal Grandmother      Social History   Occupational History  . Occupation: Worked at [employer]  . Smoking status: Former Smoker    Packs/day: 0.50    Years: 30.00    Pack years: 15.00    Types: Cigarettes    Last attempt to quit: 09/15/1975    Years since quitting:  42.9  . Smokeless tobacco: Never Used  Substance and Sexual Activity  . Alcohol use: No  . Drug use: No  . Sexual activity: Not on file    Allergies  Allergen Reactions  . Penicillins Swelling    Has patient had a PCN reaction causing immediate rash, facial/tongue/throat swelling, SOB or lightheadedness with hypotension: Yes Has patient had a PCN reaction causing severe rash involving mucus membranes or skin necrosis: No Has patient had a PCN reaction that required hospitalization: No Has patient had a PCN reaction occurring within the last 10 years: No If all of the above answers are "NO", then may proceed with Cephalosporin use.      Outpatient Medications Prior to Visit  Medication Sig Dispense Refill  . albuterol (ACCUNEB) 1.25 MG/3ML nebulizer solution Take 3 mLs (1.25 mg total) by nebulization every 6 (six) hours as needed for wheezing or shortness of breath. 75 mL 0  . alendronate (FOSAMAX) 70 MG tablet Take 70 mg by mouth once a week. Take with a full glass of water on an empty stomach. Saturdays    . amiodarone (PACERONE) 200 MG tablet Take 200 mg by mouth daily.    . carvedilol (COREG) 3.125 MG tablet Take 1 tablet (3.125 mg total) by mouth 2 (two) times daily. (Patient taking differently: Take 6.25 mg by mouth 2 (two) times daily. ) 60 tablet 6  . Cholecalciferol (VITAMIN D3) 2000 units TABS Take 2,000 Units by mouth daily.     Marland Kitchen  clonazePAM (KLONOPIN) 1 MG tablet Take 0.5 mg by mouth 2 (two) times daily. .5 mg twice a day as needed    . ELIQUIS 2.5 MG TABS tablet TAKE 1 TABLET TWICE DAILY 180 tablet 2  . Ferrous Sulfate (IRON) 28 MG TABS Take 1 tablet by mouth daily.    . furosemide (LASIX) 20 MG tablet Take 20 mg by mouth daily as needed.    . hydrALAZINE (APRESOLINE) 25 MG tablet Take 12.5 mg by mouth 2 (two) times daily.     Marland Kitchen latanoprost (XALATAN) 0.005 % ophthalmic solution Place 1 drop into both eyes at bedtime.    Marland Kitchen loperamide (IMODIUM) 2 MG capsule Take 4 mg by  mouth 4 (four) times daily as needed for diarrhea or loose stools.    . mirtazapine (REMERON) 15 MG tablet Take 15 mg by mouth at bedtime.     . potassium chloride (K-DUR) 10 MEQ tablet Take 10 mEq by mouth daily as needed. Take with lasix as needed for weight gain.    . ranitidine (ZANTAC) 150 MG tablet Take 150 mg by mouth daily as needed for heartburn.    . venlafaxine XR (EFFEXOR-XR) 150 MG 24 hr capsule Take 150 mg by mouth 2 (two) times daily.     No facility-administered medications prior to visit.     Review of Systems  Constitutional: Negative for chills, diaphoresis, fever, malaise/fatigue and weight loss.  HENT: Negative for congestion and sore throat.   Respiratory: Negative for cough, hemoptysis, sputum production, shortness of breath and wheezing.   Cardiovascular: Negative for chest pain, orthopnea, leg swelling and PND.  Gastrointestinal: Negative for abdominal pain, heartburn and nausea.  Genitourinary: Negative for frequency.  Musculoskeletal: Negative for myalgias.  Skin: Negative for rash.  Neurological: Negative for dizziness, weakness and headaches.  Endo/Heme/Allergies: Does not bruise/bleed easily.  Psychiatric/Behavioral: The patient is nervous/anxious.      Objective:    Vitals:   08/07/18 1347  BP: (!) 168/66  Pulse: 74  SpO2: 100%  Weight: 101 lb 12.8 oz (46.2 kg)   SpO2: 100 % O2 Device: None (Room air)  Physical Exam: General: Well-appearing, no acute distress HENT: [place], AT, OP clear, MMM Eyes: EOMI, no scleral icterus Respiratory: Clear to auscultation bilaterally.  No crackles, wheezing or rales Cardiovascular: RRR, -M/R/G, no JVD GI: BS+, soft, nontender Extremities:-Edema,-tenderness Neuro: AAO x4, CNII-XII grossly intact Skin: Intact, no rashes or bruising Psych: Normal mood, normal affect  Chest imaging: CXR imaging since 07/2016 demonstrates persistent RML opacity seen on recent imaging 05/21/18.  CT Chest 07/24/18 - RLL  reticulonodular densities that suggest inflammatory changes that suggests bronchitis, reticular density in RUL and RML that likely represents scarring from prior infection  PFT: None on file  I have personally reviewed the above labs, images and tests noted above.    Assessment & Plan:   Abnormal CXR Pulmonary Nodules  Discussion: 85 year old female former smoker (15 pack years) with HTN, atrial fibrillation, chronic diastolic heart failure and CKD who present for follow-up of persistent RML opacity on CXR. After review of Chest CT, this represents chronic scarring that has been present since 2018. CT does show RLL reticulonodular densities that suggest inflammatory changes that suggests bronchitis however patient is now asymptomatic and does not warrant any further evaluation or treatment. No bronchoscopic evaluation indicated now.  #Abnormal Chest X-Ray After reviewing you chest imaging, the abnormalities seen on your CXR and Chest CT are consistent with scarring from prior infection. There are  some changes related to bronchitis but I would not recommend treating this unless you are having symptoms. There are a few lung nodules <52m in size. I would recommend repeat CT in 6 months (July) monitor for changes. This can be ordered by your primary care physician. I will route message to Dr. WDema Severinafter this visit. --No bronchoscopy warranted  #Pulmonary lung nodules 666mnodule in RUL and stable 6 LUL nodule --Recommend repeat CT Chest in 6 months to be ordered by PCP  Follow-up with me as needed  No orders of the defined types were placed in this encounter. No orders of the defined types were placed in this encounter.  Return if symptoms worsen or fail to improve.  Greater than 50% of this patient 30-minute office visit was spent face-to-face in counseling with the patient/family. We discussed medical diagnosis and treatment plan as noted.  Joelee Snoke JaRodman PickleMD [facility] 08/10/2018 3:50 PM  Personal pager: [phone] unanswered, please page CCM On-call: #[phone]

## 2018-08-27 DIAGNOSIS — H401122 Primary open-angle glaucoma, left eye, moderate stage: Secondary | ICD-10-CM | POA: Diagnosis not present

## 2018-08-27 DIAGNOSIS — H401111 Primary open-angle glaucoma, right eye, mild stage: Secondary | ICD-10-CM | POA: Diagnosis not present

## 2018-08-27 DIAGNOSIS — Z961 Presence of intraocular lens: Secondary | ICD-10-CM | POA: Diagnosis not present

## 2018-09-01 DIAGNOSIS — D638 Anemia in other chronic diseases classified elsewhere: Secondary | ICD-10-CM | POA: Diagnosis not present

## 2018-09-01 DIAGNOSIS — R05 Cough: Secondary | ICD-10-CM | POA: Diagnosis not present

## 2018-09-01 DIAGNOSIS — I480 Paroxysmal atrial fibrillation: Secondary | ICD-10-CM | POA: Diagnosis not present

## 2018-09-01 DIAGNOSIS — I129 Hypertensive chronic kidney disease with stage 1 through stage 4 chronic kidney disease, or unspecified chronic kidney disease: Secondary | ICD-10-CM | POA: Diagnosis not present

## 2018-09-01 DIAGNOSIS — N183 Chronic kidney disease, stage 3 (moderate): Secondary | ICD-10-CM | POA: Diagnosis not present

## 2018-09-01 DIAGNOSIS — F419 Anxiety disorder, unspecified: Secondary | ICD-10-CM | POA: Diagnosis not present

## 2018-09-01 DIAGNOSIS — M25511 Pain in right shoulder: Secondary | ICD-10-CM | POA: Diagnosis not present

## 2018-09-01 DIAGNOSIS — E441 Mild protein-calorie malnutrition: Secondary | ICD-10-CM | POA: Diagnosis not present

## 2018-09-01 DIAGNOSIS — F339 Major depressive disorder, recurrent, unspecified: Secondary | ICD-10-CM | POA: Diagnosis not present

## 2018-09-10 DIAGNOSIS — N189 Chronic kidney disease, unspecified: Secondary | ICD-10-CM | POA: Diagnosis not present

## 2018-09-10 DIAGNOSIS — I129 Hypertensive chronic kidney disease with stage 1 through stage 4 chronic kidney disease, or unspecified chronic kidney disease: Secondary | ICD-10-CM | POA: Diagnosis not present

## 2018-09-10 DIAGNOSIS — I5022 Chronic systolic (congestive) heart failure: Secondary | ICD-10-CM | POA: Diagnosis not present

## 2018-09-10 DIAGNOSIS — I480 Paroxysmal atrial fibrillation: Secondary | ICD-10-CM | POA: Diagnosis not present

## 2018-09-10 DIAGNOSIS — N2581 Secondary hyperparathyroidism of renal origin: Secondary | ICD-10-CM | POA: Diagnosis not present

## 2018-09-10 DIAGNOSIS — R918 Other nonspecific abnormal finding of lung field: Secondary | ICD-10-CM | POA: Diagnosis not present

## 2018-09-10 DIAGNOSIS — N183 Chronic kidney disease, stage 3 (moderate): Secondary | ICD-10-CM | POA: Diagnosis not present

## 2018-09-10 DIAGNOSIS — K5090 Crohn's disease, unspecified, without complications: Secondary | ICD-10-CM | POA: Diagnosis not present

## 2018-09-10 DIAGNOSIS — C189 Malignant neoplasm of colon, unspecified: Secondary | ICD-10-CM | POA: Diagnosis not present

## 2018-09-10 DIAGNOSIS — D631 Anemia in chronic kidney disease: Secondary | ICD-10-CM | POA: Diagnosis not present

## 2018-09-10 DIAGNOSIS — K219 Gastro-esophageal reflux disease without esophagitis: Secondary | ICD-10-CM | POA: Diagnosis not present

## 2018-09-22 ENCOUNTER — Other Ambulatory Visit: Payer: Self-pay | Admitting: Cardiovascular Disease

## 2018-11-12 ENCOUNTER — Encounter: Payer: Self-pay | Admitting: Cardiovascular Disease

## 2018-11-12 ENCOUNTER — Other Ambulatory Visit: Payer: Self-pay

## 2018-11-12 ENCOUNTER — Telehealth: Payer: Self-pay | Admitting: Cardiovascular Disease

## 2018-11-12 ENCOUNTER — Telehealth (INDEPENDENT_AMBULATORY_CARE_PROVIDER_SITE_OTHER): Payer: Medicare Other | Admitting: Cardiovascular Disease

## 2018-11-12 VITALS — BP 179/85 | HR 80 | Ht <= 58 in | Wt 98.8 lb

## 2018-11-12 DIAGNOSIS — I110 Hypertensive heart disease with heart failure: Secondary | ICD-10-CM

## 2018-11-12 DIAGNOSIS — I480 Paroxysmal atrial fibrillation: Secondary | ICD-10-CM

## 2018-11-12 DIAGNOSIS — I2510 Atherosclerotic heart disease of native coronary artery without angina pectoris: Secondary | ICD-10-CM

## 2018-11-12 DIAGNOSIS — I5042 Chronic combined systolic (congestive) and diastolic (congestive) heart failure: Secondary | ICD-10-CM | POA: Diagnosis not present

## 2018-11-12 DIAGNOSIS — I428 Other cardiomyopathies: Secondary | ICD-10-CM | POA: Diagnosis not present

## 2018-11-12 DIAGNOSIS — I10 Essential (primary) hypertension: Secondary | ICD-10-CM

## 2018-11-12 NOTE — Telephone Encounter (Signed)
VIDEO/Doximity visit on 11/12/2018    Phone Call to obtain consent   -  11/12/2018        Virtual Visit Pre-Appointment Phone Call  "(Name), I am calling you today to discuss your upcoming appointment. We are currently trying to limit exposure to the virus that causes COVID-19 by seeing patients at home rather than in the office."  1. "What is the BEST phone number to call the day of the visit?" - include this in appointment notes  2. Do you have or have access to (through a family member/friend) a smartphone with video capability that we can use for your visit?" a. If yes - list this number in appt notes as cell (if different from BEST phone #) and list the appointment type as a VIDEO visit in appointment notes b. If no - list the appointment type as a PHONE visit in appointment notes  3. Confirm consent - "In the setting of the current Covid19 crisis, you are scheduled for a (phone or video) visit with your provider on (date) at (time).  Just as we do with many in-office visits, in order for you to participate in this visit, we must obtain consent.  If you'd like, I can send this to your mychart (if signed up) or email for you to review.  Otherwise, I can obtain your verbal consent now.  All virtual visits are billed to your insurance company just like a normal visit would be.  By agreeing to a virtual visit, we'd like you to understand that the technology does not allow for your provider to perform an examination, and thus may limit your provider's ability to fully assess your condition. If your provider identifies any concerns that need to be evaluated in person, we will make arrangements to do so.  Finally, though the technology is pretty good, we cannot assure that it will always work on either your or our end, and in the setting of a video visit, we may have to convert it to a phone-only visit.  In either situation, we cannot ensure that we have a secure connection.  Are you willing  to proceed?" STAFF: Did the patient verbally acknowledge consent to telehealth visit? Document YES/NO here: YES  4. Advise patient to be prepared - "Two hours prior to your appointment, go ahead and check your blood pressure, pulse, oxygen saturation, and your weight (if you have the equipment to check those) and write them all down. When your visit starts, your provider will ask you for this information. If you have an Apple Watch or Kardia device, please plan to have heart rate information ready on the day of your appointment. Please have a pen and paper handy nearby the day of the visit as well."  5. Give patient instructions for MyChart download to smartphone OR Doximity/Doxy.me as below if video visit (depending on what platform provider is using)  6. Inform patient they will receive a phone call 15 minutes prior to their appointment time (may be from unknown caller ID) so they should be prepared to answer    TELEPHONE CALL NOTE  Keyerra Lamere has been deemed a candidate for a follow-up tele-health visit to limit community exposure during the Covid-19 pandemic. I spoke with the patient via phone to ensure availability of phone/video source, confirm preferred email & phone number, and discuss instructions and expectations.  I reminded Nori Winegar to be prepared with any vital sign and/or heart rhythm information that could potentially be obtained  via home monitoring, at the time of her visit. I reminded Jamillah Camilo to expect a phone call prior to her visit.  Thayer Headings 11/12/2018 8:25 AM   INSTRUCTIONS FOR DOWNLOADING THE MYCHART APP TO SMARTPHONE  - The patient must first make sure to have activated MyChart and know their login information - If Apple, go to [employer] and type in MyChart in the search bar and download the app. If Android, ask patient to go to [employer] and type in [place] in the search bar and download the app. The app is free but as with any  other app downloads, their phone may require them to verify saved payment information or Apple/Android password.  - The patient will need to then log into the app with their MyChart username and password, and select [facility] as their healthcare provider to link the account. When it is time for your visit, go to the MyChart app, find appointments, and click Begin Video Visit. Be sure to Select Allow for your device to access the Microphone and Camera for your visit. You will then be connected, and your provider will be with you shortly.  **If they have any issues connecting, or need assistance please contact MyChart service desk [phone])**  **If using a computer, in order to ensure the best quality for their visit they will need to use either of the following Internet Browsers: [employer], or Google Chrome**  IF USING DOXIMITY or DOXY.ME - The patient will receive a link just prior to their visit by text.     FULL LENGTH CONSENT FOR TELE-HEALTH VISIT   I hereby voluntarily request, consent and authorize [facility] and its employed or contracted physicians, physician assistants, nurse practitioners or other licensed health care professionals (the Practitioner), to provide me with telemedicine health care services (the Services") as deemed necessary by the treating Practitioner. I acknowledge and consent to receive the Services by the Practitioner via telemedicine. I understand that the telemedicine visit will involve communicating with the Practitioner through live audiovisual communication technology and the disclosure of certain medical information by electronic transmission. I acknowledge that I have been given the opportunity to request an in-person assessment or other available alternative prior to the telemedicine visit and am voluntarily participating in the telemedicine visit.  I understand that I have the right to withhold or withdraw my consent to the use of  telemedicine in the course of my care at any time, without affecting my right to future care or treatment, and that the Practitioner or I may terminate the telemedicine visit at any time. I understand that I have the right to inspect all information obtained and/or recorded in the course of the telemedicine visit and may receive copies of available information for a reasonable fee.  I understand that some of the potential risks of receiving the Services via telemedicine include:   Delay or interruption in medical evaluation due to technological equipment failure or disruption;  Information transmitted may not be sufficient (e.g. poor resolution of images) to allow for appropriate medical decision making by the Practitioner; and/or   In rare instances, security protocols could fail, causing a breach of personal health information.  Furthermore, I acknowledge that it is my responsibility to provide information about my medical history, conditions and care that is complete and accurate to the best of my ability. I acknowledge that Practitioner's advice, recommendations, and/or decision may be based on factors not within their control, such as incomplete or  inaccurate data provided by me or distortions of diagnostic images or specimens that may result from electronic transmissions. I understand that the practice of medicine is not an exact science and that Practitioner makes no warranties or guarantees regarding treatment outcomes. I acknowledge that I will receive a copy of this consent concurrently upon execution via email to the email address I last provided but may also request a printed copy by calling the office of [facility].    I understand that my insurance will be billed for this visit.   I have read or had this consent read to me.  I understand the contents of this consent, which adequately explains the benefits and risks of the Services being provided via telemedicine.   I have been  provided ample opportunity to ask questions regarding this consent and the Services and have had my questions answered to my satisfaction.  I give my informed consent for the services to be provided through the use of telemedicine in my medical care  By participating in this telemedicine visit I agree to the above.

## 2018-11-12 NOTE — Progress Notes (Signed)
Virtual Visit via Video Note   This visit type was conducted due to national recommendations for restrictions regarding the COVID-19 Pandemic (e.g. social distancing) in an effort to limit this patient's exposure and mitigate transmission in our community.  Due to her co-morbid illnesses, this patient is at least at moderate risk for complications without adequate follow up.  This format is felt to be most appropriate for this patient at this time.  All issues noted in this document were discussed and addressed.  A limited physical exam was performed with this format.  Please refer to the patient's chart for her consent to telehealth for [facility].   Evaluation Performed:  Follow-up visit  Date:  11/12/2018   ID:  Melissa Mcdaniel, DOB 06-Aug-1932, MRN [id number]  Patient Location: Home Provider Location: Home  PCP:  Harlan Stains, MD  Cardiologist:  Lauree Chandler, MD  Electrophysiologist:  None   Chief Complaint:  Follow up-atrial fibrillation  History of Present Illness:    Melissa Mcdaniel is a 85 y.o. female with history of HTN, CAD, chronic combined diastolic/systolic CHF, persistent atrial fibrillation, Crohn's disease and colon cancer who is being seen today by virtual e-visit due to the Covid19 pandemic. I saw her as a new patient in February 2018. She had recently moved from [facility] to [place] to be near her daughter. She is known to have atrial fibrillation. She has been on Eliquis. She had a cardioversion in December 2015 and was started on amiodarone at that time. Echo in November 2017 at [facility] showed LVEF=35-40% with global HK, moderate MR, mild to moderate TR. She had a cardiac cath in 2002 showing 30% ostial RCA stenosis. She has had colon cancer and has had a colon resection. No evidence of carotid disease by dopplers 2010. Stress test in 2012 did not show ischemia. I saw her in December 2018 with c/o dyspnea but no cough, fever, sick contacts  or LE edema. Chest x-ray was clear. Echo 07/05/17 with LVEF=50-55%. Mild AI and mild MR.  D-dimer was slightly elevated. Chest CTA without evidence of PE. Her BNP was elevated so Lasix was increased. Her dyspnea resolved but renal function worsened with higher doses of Lasix. She has been using Lasix as needed since then. She was [facility] to [facility]/29/18 with respiratory distress with likely acute diastolic CHF and possible pneumonia. She was diuresed and improved rapidly. Mild troponin elevation but no chest pain. Echo 03/31/18 with LVEF=55-60% with no wall motion abnormalilties, grade 2 diastolic dysfunction. There was mild AI.   She tells me today that she is feeling well. No chest pain or dyspnea. No LE edema. Weight is stable.   The patient does not have symptoms concerning for COVID-19 infection (fever, chills, cough, or new shortness of breath).    Past Medical History:  Diagnosis Date   A-fib [facility])    Chronic kidney disease    Congestive heart failure (CHF) ([place]) 05/2016   Crohn's disease ([place])    Dysrhythmia    afib dx approx. 2016 per pt   Hip fracture ([place])    Hypertension    Past Surgical History:  Procedure Laterality Date   FEMUR IM NAIL Right 07/26/2016   Procedure: INTRAMEDULLARY (IM) NAIL FEMORAL;  Surgeon: Renette Butters, MD;  Location: [facility];  Service: Orthopedics;  Laterality: Right;   FRACTURE SURGERY     large intestine - partial removal  approx 1993   due to crohns  Current Meds  Medication Sig   albuterol (ACCUNEB) 1.25 MG/3ML nebulizer solution Take 3 mLs (1.25 mg total) by nebulization every 6 (six) hours as needed for wheezing or shortness of breath.   alendronate (FOSAMAX) 70 MG tablet Take 70 mg by mouth once a week. Take with a full glass of water on an empty stomach. Saturdays   amiodarone (PACERONE) 200 MG tablet TAKE 1 TABLET EVERY DAY   carvedilol (COREG) 6.25 MG tablet Take 1 tablet by mouth 2 (two) times a day.   Cholecalciferol  (VITAMIN D3) 2000 units TABS Take 2,000 Units by mouth daily.    clonazePAM (KLONOPIN) 1 MG tablet Take 0.5 mg by mouth 2 (two) times daily. .5 mg twice a day as needed   ELIQUIS 2.5 MG TABS tablet TAKE 1 TABLET TWICE DAILY   Ferrous Sulfate (IRON) 28 MG TABS Take 1 tablet by mouth daily.   furosemide (LASIX) 20 MG tablet Take 20 mg by mouth daily as needed.   hydrALAZINE (APRESOLINE) 25 MG tablet Take 25 mg by mouth 2 (two) times daily.    latanoprost (XALATAN) 0.005 % ophthalmic solution Place 1 drop into both eyes at bedtime.   loperamide (IMODIUM) 2 MG capsule Take 4 mg by mouth 4 (four) times daily as needed for diarrhea or loose stools.   mirtazapine (REMERON) 15 MG tablet Take 15 mg by mouth at bedtime.    potassium chloride (K-DUR) 10 MEQ tablet Take 10 mEq by mouth daily as needed. Take with lasix as needed for weight gain.   ranitidine (ZANTAC) 150 MG tablet Take 150 mg by mouth daily as needed for heartburn.   venlafaxine XR (EFFEXOR-XR) 150 MG 24 hr capsule Take 150 mg by mouth 2 (two) times daily.   [DISCONTINUED] carvedilol (COREG) 3.125 MG tablet Take 1 tablet (3.125 mg total) by mouth 2 (two) times daily. (Patient taking differently: Take 6.25 mg by mouth 2 (two) times daily. )     Allergies:   Penicillins   Social History   Tobacco Use   Smoking status: Former Smoker    Packs/day: 0.50    Years: 30.00    Pack years: 15.00    Types: Cigarettes    Last attempt to quit: 09/15/1975    Years since quitting: 43.1   Smokeless tobacco: Never Used  Substance Use Topics   Alcohol use: No   Drug use: No     Family Hx: The patient's family history includes Alzheimer's disease in her father; Diabetes in her maternal grandmother; Lung cancer in her mother.  ROS:   Please see the history of present illness.    All other systems reviewed and are negative.   Prior CV studies:   The following studies were reviewed today:  Echo 03/31/18: Left ventricle: The  cavity size was normal. There was mild concentric hypertrophy. Systolic function was normal. The estimated ejection fraction was in the range of 55% to 60%. Wall motion was normal; there were no regional wall motion abnormalities. Features are consistent with a pseudonormal left ventricular filling pattern, with concomitant abnormal relaxation and increased filling pressure (grade 2 diastolic dysfunction). Doppler parameters are consistent with high ventricular filling pressure. - Aortic valve: Transvalvular velocity was within the normal range. There was no stenosis. There was mild regurgitation. Valve area (VTI): 1.45 cm^2. Valve area (Vmax): 1.56 cm^2. Valve area (Vmean): 1.53 cm^2. - Mitral valve: Transvalvular velocity was within the normal range. There was no evidence for stenosis. There was trivial regurgitation. - Left  atrium: The atrium was severely dilated. - Right ventricle: The cavity size was normal. Wall thickness was normal. Systolic function was normal. - Atrial septum: No defect or patent foramen ovale was identified by color flow Doppler. - Tricuspid valve: There was mild regurgitation. - Pulmonary arteries: Systolic pressure was within the normal range. PA peak pressure: 35 mm Hg (S).  Labs/Other Tests and Data Reviewed:    EKG:  No ECG reviewed.  Recent Labs: 03/29/2018: B Natriuretic Peptide 367.9; Hemoglobin 8.9; Platelets 282 07/03/2018: BUN 26; Creatinine, Ser 1.59; Potassium 5.9; Sodium 137   Recent Lipid Panel No results found for: CHOL, TRIG, HDL, CHOLHDL, LDLCALC, LDLDIRECT  Wt Readings from Last 3 Encounters:  11/12/18 98 lb 12.8 oz (44.8 kg)  08/07/18 101 lb 12.8 oz (46.2 kg)  07/03/18 100 lb 9.6 oz (45.6 kg)     Objective:    Vital Signs:  BP (!) 179/85    Pulse 80    Ht 4' 9.5" (1.461 m)    Wt 98 lb 12.8 oz (44.8 kg)    BMI 21.01 kg/m    Converted to telephone visit due to pt's problem with webcam. No exam  performed.   ASSESSMENT & PLAN:    1. Atrial fibrillation,paroxysmal: No palpitations. Continue amiodarone, Coreg and Eliquis. She has yearly eye exams. Labs up to date.   2. Chronic combined systolic and diastolic CHF:  Weight is stable at home per pt. She is using Lasix as needed.    3. HTN: BP is controlled at home.   4. CAD without angina: Mild CAD by cath in 2002. No chest pain. Continue beta blocker. She has not been on a statin as she has not wished to take one in the past. She has not been on ASA since she has been on Eliquis.    5. Non-ischemic cardiomyopathy: LVEF=55-60% by echo September 2019. Will continue beta blocker and hydralazine. No Aceinh/ARB due to renal insufficiency  COVID-19 Education: The signs and symptoms of COVID-19 were discussed with the patient and how to seek care for testing (follow up with PCP or arrange E-visit).  The importance of social distancing was discussed today.  Time:   Today, I have spent 15 minutes with the patient with telehealth technology discussing the above problems.     Medication Adjustments/Labs and Tests Ordered: Current medicines are reviewed at length with the patient today.  Concerns regarding medicines are outlined above.   Tests Ordered: No orders of the defined types were placed in this encounter.   Medication Changes: No orders of the defined types were placed in this encounter.   Disposition:  Follow up in 6 month(s)  Signed, Lauree Chandler, MD  11/12/2018 10:01 AM    [facility]

## 2018-11-12 NOTE — Patient Instructions (Signed)
Medication Instructions:  No changes If you need a refill on your cardiac medications before your next appointment, please call your pharmacy.   Lab work: none  Testing/Procedures: none  Follow-Up: At [facility], you and your health needs are our priority.  As part of our continuing mission to provide you with exceptional heart care, we have created designated Provider Care Teams.  These Care Teams include your primary Cardiologist (physician) and Advanced Practice Providers (APPs -  Physician Assistants and Nurse Practitioners) who all work together to provide you with the care you need, when you need it. You will need a follow up appointment in 6 months.  Please call our office 2 months in advance to schedule this appointment.  You may see Lauree Chandler, MD or one of the following Advanced Practice Providers on your designated Care Team:  [place], PA-C Melina Copa, PA-C . Ermalinda Barrios, PA-C  Any Other Special Instructions Will Be Listed Below (If Applicable).

## 2018-11-13 ENCOUNTER — Ambulatory Visit: Payer: Medicare Other | Admitting: Cardiovascular Disease

## 2018-12-01 DIAGNOSIS — I129 Hypertensive chronic kidney disease with stage 1 through stage 4 chronic kidney disease, or unspecified chronic kidney disease: Secondary | ICD-10-CM | POA: Diagnosis not present

## 2018-12-01 DIAGNOSIS — N183 Chronic kidney disease, stage 3 (moderate): Secondary | ICD-10-CM | POA: Diagnosis not present

## 2018-12-01 DIAGNOSIS — F339 Major depressive disorder, recurrent, unspecified: Secondary | ICD-10-CM | POA: Diagnosis not present

## 2018-12-01 DIAGNOSIS — K5010 Crohn's disease of large intestine without complications: Secondary | ICD-10-CM | POA: Diagnosis not present

## 2018-12-01 DIAGNOSIS — F419 Anxiety disorder, unspecified: Secondary | ICD-10-CM | POA: Diagnosis not present

## 2018-12-01 DIAGNOSIS — E440 Moderate protein-calorie malnutrition: Secondary | ICD-10-CM | POA: Diagnosis not present

## 2018-12-01 DIAGNOSIS — I480 Paroxysmal atrial fibrillation: Secondary | ICD-10-CM | POA: Diagnosis not present

## 2018-12-19 ENCOUNTER — Telehealth: Payer: Self-pay | Admitting: Cardiovascular Disease

## 2018-12-19 NOTE — Telephone Encounter (Signed)
I spoke with pt and gave her information from Dr. Angelena Form.  She will let us know if heart rate remains elevated.

## 2018-12-19 NOTE — Telephone Encounter (Signed)
I spoke with pt. She felt dizzy yesterday AM so she checked her heart rate and it was 108 BP was 111/73. Took tylenol which helped the dizziness a little. Since yesterday her heart rate has continued to stay above 100.  Today pulse was 106 and BP 106/65.  Rechecked later it was 112/75 and pulse was 111.  She does not feel any irregular or skipped beats. Just some dizziness throughout the day.  She is concerned about heart rate as it is usually not this high.  Is taking amiodrone, coreg and eliquis as listed.

## 2018-12-19 NOTE — Telephone Encounter (Signed)
New Message    STAT if HR is under 50 or over 120 (normal HR is 60-100 beats per minute)  1) What is your heart rate? Hr 106   2) Do you have a log of your heart rate readings (document readings)? Yes   3) Do you have any other symptoms? The past couple of days the pt says her resting HR has been in the 100's, she says she did have some dizziness and lightheadedness in the mornings

## 2018-12-19 NOTE — Telephone Encounter (Signed)
She has chronic atrial fib. Not sure why her heart rate is up now. She takes Coreg 6.25 mg twice daily. Would be hesitant to increase the Coreg due to soft BP. Would encourage her to push oral intake of fluids over the weekend and follow heart rate. If still elevated next week, call back. May need office appt.   Gerald Stabs

## 2018-12-22 ENCOUNTER — Telehealth: Payer: Self-pay | Admitting: Cardiovascular Disease

## 2018-12-22 NOTE — Telephone Encounter (Signed)
Thanks

## 2018-12-22 NOTE — Telephone Encounter (Signed)
Patient would like to be seen by Dr. Angelena Form about he HR.  She states it still has not gone down.

## 2018-12-22 NOTE — Telephone Encounter (Signed)
    COVID-19 Pre-Screening Questions:  . In the past 7 to 10 days have you had a cough,  shortness of breath, headache, congestion, fever (100 or greater) body aches, chills, sore throat, or sudden loss of taste or sense of smell? no . Have you been around anyone with known Covid 19. no . Have you been around anyone who is awaiting Covid 19 test results in the past 7 to 10 days? no . Have you been around anyone who has been exposed to Covid 19, or has mentioned symptoms of Covid 19 within the past 7 to 10 days? No  Pt aware to wear mask to this appointment.   I spoke with pt. She reports heart rate is still staying 100-110 range.  Only had dizziness in the morning. BP OK per pt report. She is very nervous about her heart rate.  Per last telephone note if heart rate remains elevated would need office visit.  Appointment made for pt to see Daune Perch, [place] on June 2,2020 at 8:15.

## 2018-12-22 NOTE — Progress Notes (Signed)
Cardiology Office Note:    Date:  12/23/2018   ID:  Melissa Mcdaniel, DOB 24-Feb-1933, MRN [id number]  PCP:  Harlan Stains, MD  Cardiologist:  Lauree Chandler, MD  Referring MD: Harlan Stains, MD   Chief Complaint  Patient presents with  . Tachycardia    History of Present Illness:    Melissa Mcdaniel is a 85 y.o. female with a past medical history significant for HTN, CAD, chronic combined diastolic/systolic CHF, persistent atrial fibrillation on Eliquis, Crohn's disease and colon cancer. She had cardioversion in 06/2014 and started on amiodarone at that time. Echo in November 2017 at [facility] showed LVEF=35-40% with global HK, moderate MR, mild to moderate TR. She had a cardiac cath in 2002 showing 30% ostial RCA stenosis. She has had colon cancer and has had a colon resection. No evidence of carotid disease by dopplers 2010.Stress test in2012 did not show ischemia. I saw Melissa Mcdaniel in December 2018 with c/o dyspnea but no cough, fever, sick contacts orLE edema. Chest x-ray was clear. Echo 07/05/17 with LVEF=50-55%. Mild AI and mild MR. D-dimer was slightly elevated. Chest CTA without evidence of PE. Melissa Mcdaniel BNP was elevated so Lasix was increased. Melissa Mcdaniel dyspnea resolved but renal function worsened with higher doses of Lasix. She has been using Lasix as needed since then. She was admitted to [facility] 03/29/18 with respiratory distress with likely acute diastolic CHF and possible pneumonia. She was diuresed and improved rapidly. Mild troponin elevation but no chest pain. Echo 03/31/18 with LVEF=55-60% with no wall motion abnormalilties, grade 2 diastolic dysfunction. There was mild AI.   Melissa Mcdaniel was seen by virtual visit on 11/12/2018 by Dr. Angelena Form at which time she was doing well. She called the office on 12/19/18 with complaints of increased HR of 106. Dr. Angelena Form was reluctant to increase coreg due to soft BP. It was not clear what was driving Melissa Mcdaniel increased HR and it apparently did not  improve with increasing oral fluid intake so she is being seen in the office today.   Today she is being seen in the office for continued increased heart rate. Melissa Mcdaniel EKG shows atrial fibrillation in the low 100's. Pt says that she notices Melissa Mcdaniel HR has been up to the low 100's when she check Melissa Mcdaniel BP daily. She deneis any increase in Melissa Mcdaniel usual exertional dyspnea with walking up stairs, doing launderya nd making the bed. She denies chest pain or pressure, orthopnea, PND, edema. Has occ mild, brief dizziness when she first gets up in the morning.   She checks daily wts and has been stable around 99 lbs.  Melissa Mcdaniel BP has been running 120's-140/60-70.    Past Medical History:  Diagnosis Date  . A-fib ([place])   . Chronic kidney disease   . Congestive heart failure (CHF) ([place]) 05/2016  . Crohn's disease ([place])   . Dysrhythmia    afib dx approx. 2016 per pt  . Hip fracture ([place])   . Hypertension     Past Surgical History:  Procedure Laterality Date  . FEMUR IM NAIL Right 07/26/2016   Procedure: INTRAMEDULLARY (IM) NAIL FEMORAL;  Surgeon: Renette Butters, MD;  Location: [facility];  Service: Orthopedics;  Laterality: Right;  . FRACTURE SURGERY    . large intestine - partial removal  approx 1993   due to crohns    Current Medications: Current Meds  Medication Sig  . albuterol (ACCUNEB) 1.25 MG/3ML nebulizer solution Take 3 mLs (1.25 mg total) by nebulization every 6 (six) hours as  needed for wheezing or shortness of breath.  Marland Kitchen alendronate (FOSAMAX) 70 MG tablet Take 70 mg by mouth once a week. Take with a full glass of water on an empty stomach. Saturdays  . amiodarone (PACERONE) 200 MG tablet TAKE 1 TABLET EVERY DAY  . Cholecalciferol (VITAMIN D3) 2000 units TABS Take 2,000 Units by mouth daily.   . clonazePAM (KLONOPIN) 1 MG tablet Take 0.5 mg by mouth 2 (two) times daily. .5 mg twice a day as needed  . ELIQUIS 2.5 MG TABS tablet TAKE 1 TABLET TWICE DAILY  . Ferrous Sulfate (IRON) 28 MG TABS Take 1 tablet  by mouth daily.  . furosemide (LASIX) 20 MG tablet Take 20 mg by mouth daily as needed.  . hydrALAZINE (APRESOLINE) 25 MG tablet Take 25 mg by mouth 2 (two) times daily.   Marland Kitchen latanoprost (XALATAN) 0.005 % ophthalmic solution Place 1 drop into both eyes at bedtime.  Marland Kitchen loperamide (IMODIUM) 2 MG capsule Take 4 mg by mouth 4 (four) times daily as needed for diarrhea or loose stools.  . mirtazapine (REMERON) 15 MG tablet Take 15 mg by mouth at bedtime.   . potassium chloride (K-DUR) 10 MEQ tablet Take 10 mEq by mouth daily as needed. Take with lasix as needed for weight gain.  Marland Kitchen venlafaxine XR (EFFEXOR-XR) 150 MG 24 hr capsule Take 150 mg by mouth 2 (two) times daily.  . [DISCONTINUED] carvedilol (COREG) 6.25 MG tablet Take 1 tablet by mouth 2 (two) times a day.     Allergies:   Penicillins   Social History   Socioeconomic History  . Marital status: Married    Spouse name: Not on file  . Number of children: 3  . Years of education: Not on file  . Highest education level: Not on file  Occupational History  . Occupation: Worked at [employer]  . Financial resource strain: Not on file  . Food insecurity:    Worry: Not on file    Inability: Not on file  . Transportation needs:    Medical: Not on file    Non-medical: Not on file  Tobacco Use  . Smoking status: Former Smoker    Packs/day: 0.50    Years: 30.00    Pack years: 15.00    Types: Cigarettes    Last attempt to quit: 09/15/1975    Years since quitting: 43.3  . Smokeless tobacco: Never Used  Substance and Sexual Activity  . Alcohol use: No  . Drug use: No  . Sexual activity: Not on file  Lifestyle  . Physical activity:    Days per week: Not on file    Minutes per session: Not on file  . Stress: Not on file  Relationships  . Social connections:    Talks on phone: Not on file    Gets together: Not on file    Attends religious service: Not on file    Active member of club or organization: Not on file     Attends meetings of clubs or organizations: Not on file    Relationship status: Not on file  Other Topics Concern  . Not on file  Social History Narrative  . Not on file     Family History: The patient's family history includes Alzheimer's disease in Melissa Mcdaniel father; Diabetes in Melissa Mcdaniel maternal grandmother; Lung cancer in Melissa Mcdaniel mother. ROS:   Please see the history of present illness.     All other systems reviewed and are negative.  EKGs/Labs/Other  Studies Reviewed:    The following studies were reviewed today:   Echo9/9/19: Left ventricle: The cavity size was normal. There was mild concentric hypertrophy. Systolic function was normal. The estimated ejection fraction was in the range of 55% to 60%. Wall motion was normal; there were no regional wall motion abnormalities. Features are consistent with a pseudonormal left ventricular filling pattern, with concomitant abnormal relaxation and increased filling pressure (grade 2 diastolic dysfunction). Doppler parameters are consistent with high ventricular filling pressure. - Aortic valve: Transvalvular velocity was within the normal range. There was no stenosis. There was mild regurgitation. Valve area (VTI): 1.45 cm^2. Valve area (Vmax): 1.56 cm^2. Valve area (Vmean): 1.53 cm^2. - Mitral valve: Transvalvular velocity was within the normal range. There was no evidence for stenosis. There was trivial regurgitation. - Left atrium: The atrium was severely dilated. - Right ventricle: The cavity size was normal. Wall thickness was normal. Systolic function was normal. - Atrial septum: No defect or patent foramen ovale was identified by color flow Doppler. - Tricuspid valve: There was mild regurgitation. - Pulmonary arteries: Systolic pressure was within the normal range. PA peak pressure: 35 mm Hg (S).  EKG:  EKG is  ordered today.  The ekg ordered today demonstrates atrial fib at 103 bpm.   Recent Labs:  03/29/2018: B Natriuretic Peptide 367.9; Hemoglobin 8.9; Platelets 282 07/03/2018: BUN 26; Creatinine, Ser 1.59; Potassium 5.9; Sodium 137   Recent Lipid Panel No results found for: CHOL, TRIG, HDL, CHOLHDL, VLDL, LDLCALC, LDLDIRECT  Physical Exam:    VS:  BP 128/70   Pulse (!) 103   Ht 4' 9.5" (1.461 m)   Wt 101 lb 12.8 oz (46.2 kg)   SpO2 97%   BMI 21.65 kg/m     Wt Readings from Last 3 Encounters:  12/23/18 101 lb 12.8 oz (46.2 kg)  11/12/18 98 lb 12.8 oz (44.8 kg)  08/07/18 101 lb 12.8 oz (46.2 kg)     Physical Exam  Constitutional: She is oriented to person, place, and time. No distress.  Very thin, elderly female  HENT:  Head: Normocephalic and atraumatic.  Neck: Normal range of motion. Neck supple. No JVD present.  Cardiovascular: Normal rate and normal heart sounds. An irregularly irregular rhythm present. Exam reveals no gallop and no friction rub.  No murmur heard. Pulmonary/Chest: Effort normal and breath sounds normal. No respiratory distress. She has no wheezes. She has no rales.  Abdominal: Soft. Bowel sounds are normal.  Musculoskeletal: Normal range of motion.        General: No deformity or edema.  Neurological: She is alert and oriented to person, place, and time.  Skin: Skin is warm and dry.  Psychiatric: She has a normal mood and affect. Melissa Mcdaniel behavior is normal. Judgment and thought content normal.     ASSESSMENT:    1. Atrial fibrillation, unspecified type ([place])    PLAN:    In order of problems listed above:  1. Atrial fibrillation, paroxysmal -Managed with amiodarone and coreg. Apparently had been in sinus rhythm in 2019. -On Eliquis for stroke risk reduction.  -pt with c/o increased HR, but no symptoms. -Will increase carvedilol for better rate control and see if decreasing rate gets Melissa Mcdaniel back into SR. Will follow up EKG in 1 week. May need to consider either increasind amiodarone for a week to 400 mg daily or converting to rate control  startegy. Could consider DCCV if still in afib. Pt states that she is compliant with Melissa Mcdaniel anticoagulation,  not missing any doses. Will likely refer to afib clinic if still in afib with follow up EKG.   2. Chronic combined systolic and diastolic CHF -On lasix as needed. Appears euvolemic and not having any heart failure symptoms.  3. Hypertension -BP well controlled and stable. Hopefully she will tolerate the increase in carvedilol. We discussed watching for low BP with SBP less than 100. Pt will call us if BP too low.   Medication Adjustments/Labs and Tests Ordered: Current medicines are reviewed at length with the patient today.  Concerns regarding medicines are outlined above. Labs and tests ordered and medication changes are outlined in the patient instructions below:  Patient Instructions  Medication Instructions:  INCREASE: Carvedilol to 12.5 mg twice a day   If you need a refill on your cardiac medications before your next appointment, please call your pharmacy.   Lab work: None  If you have labs (blood work) drawn today and your tests are completely normal, you will receive your results only by: Marland Kitchen MyChart Message (if you have MyChart) OR . A paper copy in the mail If you have any lab test that is abnormal or we need to change your treatment, we will call you to review the results.  Testing/Procedures: None  Follow-Up: At [facility], you and your health needs are our priority.  As part of our continuing mission to provide you with exceptional heart care, we have created designated Provider Care Teams.  These Care Teams include your primary Cardiologist (physician) and Advanced Practice Providers (APPs -  Physician Assistants and Nurse Practitioners) who all work together to provide you with the care you need, when you need it. You will need a follow up appointment in 3 months.  Please call our office 2 months in advance to schedule this appointment.  You may see Lauree Chandler, MD or one of the following Advanced Practice Providers on your designated Care Team:   [place], PA-C Melina Copa, PA-C . Ermalinda Barrios, PA-C  Return in 1 week for a EKG  Any Other Special Instructions Will Be Listed Below (If Applicable).       Signed, Daune Perch, NP  12/23/2018 12:19 PM    [place]

## 2018-12-23 ENCOUNTER — Telehealth: Payer: Self-pay | Admitting: Cardiology

## 2018-12-23 ENCOUNTER — Encounter: Payer: Self-pay | Admitting: Cardiology

## 2018-12-23 ENCOUNTER — Other Ambulatory Visit: Payer: Self-pay

## 2018-12-23 ENCOUNTER — Ambulatory Visit (INDEPENDENT_AMBULATORY_CARE_PROVIDER_SITE_OTHER): Payer: Medicare Other | Admitting: Cardiology

## 2018-12-23 VITALS — BP 128/70 | HR 103 | Ht <= 58 in | Wt 101.8 lb

## 2018-12-23 DIAGNOSIS — I4891 Unspecified atrial fibrillation: Secondary | ICD-10-CM

## 2018-12-23 DIAGNOSIS — I10 Essential (primary) hypertension: Secondary | ICD-10-CM | POA: Diagnosis not present

## 2018-12-23 DIAGNOSIS — I2510 Atherosclerotic heart disease of native coronary artery without angina pectoris: Secondary | ICD-10-CM | POA: Diagnosis not present

## 2018-12-23 DIAGNOSIS — I5042 Chronic combined systolic (congestive) and diastolic (congestive) heart failure: Secondary | ICD-10-CM | POA: Diagnosis not present

## 2018-12-23 MED ORDER — CARVEDILOL 12.5 MG PO TABS: 12.5000 mg | ORAL_TABLET | Freq: Two times a day (BID) | ORAL | 3 refills | Status: DC

## 2018-12-23 NOTE — Patient Instructions (Signed)
Medication Instructions:  INCREASE: Carvedilol to 12.5 mg twice a day   If you need a refill on your cardiac medications before your next appointment, please call your pharmacy.   Lab work: None  If you have labs (blood work) drawn today and your tests are completely normal, you will receive your results only by: Marland Kitchen MyChart Message (if you have MyChart) OR . A paper copy in the mail If you have any lab test that is abnormal or we need to change your treatment, we will call you to review the results.  Testing/Procedures: None  Follow-Up: At [facility], you and your health needs are our priority.  As part of our continuing mission to provide you with exceptional heart care, we have created designated Provider Care Teams.  These Care Teams include your primary Cardiologist (physician) and Advanced Practice Providers (APPs -  Physician Assistants and Nurse Practitioners) who all work together to provide you with the care you need, when you need it. You will need a follow up appointment in 3 months.  Please call our office 2 months in advance to schedule this appointment.  You may see Lauree Chandler, MD or one of the following Advanced Practice Providers on your designated Care Team:   [place], PA-C Melina Copa, PA-C . Ermalinda Barrios, PA-C  Return in 1 week for a EKG  Any Other Special Instructions Will Be Listed Below (If Applicable).

## 2018-12-23 NOTE — Telephone Encounter (Signed)
New Message   Patient is calling because she was told to come back in a week for a EKG. Please contact patient to schedule.

## 2018-12-24 ENCOUNTER — Ambulatory Visit: Payer: Medicare Other

## 2018-12-30 ENCOUNTER — Telehealth: Payer: Self-pay | Admitting: Cardiovascular Disease

## 2018-12-30 ENCOUNTER — Ambulatory Visit (INDEPENDENT_AMBULATORY_CARE_PROVIDER_SITE_OTHER): Payer: Medicare Other | Admitting: *Deleted

## 2018-12-30 ENCOUNTER — Other Ambulatory Visit: Payer: Self-pay

## 2018-12-30 DIAGNOSIS — I4891 Unspecified atrial fibrillation: Secondary | ICD-10-CM

## 2018-12-30 MED ORDER — CARVEDILOL 25 MG PO TABS: 25.0000 mg | ORAL_TABLET | Freq: Two times a day (BID) | ORAL | 3 refills | Status: DC

## 2018-12-30 NOTE — Telephone Encounter (Signed)
  Patient came in today for an EKG and wants to know if anyone will call her with the results and she is wondering if she will need to come in sooner than her 6 month in October

## 2018-12-30 NOTE — Progress Notes (Addendum)
1.) Reason for visit: EKG  2.) Name of MD requesting visit: Daune Perch, NP  3.) ROS related to problem: Pt in today for EKG to see Afib burden.  Pt came with BP and HR readings.  BP range was 123-155/61-103.  HR range from 82-109.  Pt c/o SOB with exertion that is unchanged since last visit.  4.) Assessment and plan per MD: EKG performed, showing Afib with rate of 111.  Reviewed with DOD, Dr. Curt Bears and he said to have pt increase Coreg to 85m BID.  Advised pt of recommendation and that information would be sent to NPecolia Ades NP for further recommendations.

## 2018-12-30 NOTE — Telephone Encounter (Signed)
Spoke with the pt and she agrees that she will increase as advised her Coreg and will receive her letter end of the summer to make her Oct appt with Dr. Angelena Form.. she will call early next week and let us know how she is feeling.

## 2018-12-30 NOTE — Addendum Note (Signed)
Addended by: Loren Racer on: 12/30/2018 04:09 PM   Modules accepted: Orders

## 2018-12-31 ENCOUNTER — Telehealth: Payer: Self-pay | Admitting: Cardiovascular Disease

## 2018-12-31 NOTE — Telephone Encounter (Signed)
EKG findings reviewed w/ pt. Pt thought she would get a call today.  Informed that Melissa Ades, NP is off today and pt should hear from [place] by end of the week w/ recommendation/s. Patient verbalized understanding and agreeable to plan.

## 2018-12-31 NOTE — Addendum Note (Signed)
Addended by: Mendel Ryder on: 12/31/2018 01:30 PM   Modules accepted: Orders

## 2018-12-31 NOTE — Telephone Encounter (Signed)
New Message             Patient is calling about her EKG results and when she needs to come in again.

## 2019-01-01 NOTE — Telephone Encounter (Signed)
EKG still with afib at 111 bpm. I had increased carvedilol to 12.5 mg with little improvement and then carvedilol was increased again to 25 mg. Husband reports SBP was 80 this morning. No lightheadedness, CP, SOB. She does feel fatigued. I advised to decrease back to 12.66m and I will refer to afib clinic. Pt and husband are in agreement. They will expect a call to arrange  For an appointment.

## 2019-01-06 ENCOUNTER — Other Ambulatory Visit: Payer: Self-pay

## 2019-01-06 ENCOUNTER — Ambulatory Visit (HOSPITAL_COMMUNITY)
Admission: RE | Admit: 2019-01-06 | Discharge: 2019-01-06 | Disposition: A | Discharge status: Home / Self Care | Payer: Medicare Other | Source: Ambulatory Visit | Attending: Physician Assistant | Admitting: Physician Assistant

## 2019-01-06 ENCOUNTER — Encounter (HOSPITAL_COMMUNITY): Payer: Self-pay | Admitting: Physician Assistant

## 2019-01-06 VITALS — BP 148/76 | HR 101 | Ht <= 58 in | Wt 103.0 lb

## 2019-01-06 DIAGNOSIS — Z87891 Personal history of nicotine dependence: Secondary | ICD-10-CM | POA: Diagnosis not present

## 2019-01-06 DIAGNOSIS — K5090 Crohn's disease, unspecified, without complications: Secondary | ICD-10-CM | POA: Insufficient documentation

## 2019-01-06 DIAGNOSIS — N189 Chronic kidney disease, unspecified: Secondary | ICD-10-CM | POA: Diagnosis not present

## 2019-01-06 DIAGNOSIS — Z801 Family history of malignant neoplasm of trachea, bronchus and lung: Secondary | ICD-10-CM | POA: Diagnosis not present

## 2019-01-06 DIAGNOSIS — Z85038 Personal history of other malignant neoplasm of large intestine: Secondary | ICD-10-CM | POA: Insufficient documentation

## 2019-01-06 DIAGNOSIS — I2510 Atherosclerotic heart disease of native coronary artery without angina pectoris: Secondary | ICD-10-CM | POA: Insufficient documentation

## 2019-01-06 DIAGNOSIS — I5042 Chronic combined systolic (congestive) and diastolic (congestive) heart failure: Secondary | ICD-10-CM | POA: Insufficient documentation

## 2019-01-06 DIAGNOSIS — Z833 Family history of diabetes mellitus: Secondary | ICD-10-CM | POA: Diagnosis not present

## 2019-01-06 DIAGNOSIS — Z7901 Long term (current) use of anticoagulants: Secondary | ICD-10-CM | POA: Insufficient documentation

## 2019-01-06 DIAGNOSIS — Z79899 Other long term (current) drug therapy: Secondary | ICD-10-CM | POA: Insufficient documentation

## 2019-01-06 DIAGNOSIS — R9431 Abnormal electrocardiogram [ECG] [EKG]: Secondary | ICD-10-CM | POA: Insufficient documentation

## 2019-01-06 DIAGNOSIS — I130 Hypertensive heart and chronic kidney disease with heart failure and stage 1 through stage 4 chronic kidney disease, or unspecified chronic kidney disease: Secondary | ICD-10-CM | POA: Insufficient documentation

## 2019-01-06 DIAGNOSIS — Z880 Allergy status to penicillin: Secondary | ICD-10-CM | POA: Insufficient documentation

## 2019-01-06 DIAGNOSIS — I4819 Other persistent atrial fibrillation: Secondary | ICD-10-CM | POA: Diagnosis not present

## 2019-01-06 MED ORDER — AMIODARONE HCL 200 MG PO TABS: 200.0000 mg | ORAL_TABLET | Freq: Two times a day (BID) | ORAL | 2 refills | Status: DC

## 2019-01-06 NOTE — Progress Notes (Signed)
Primary Care Physician: Harlan Stains, MD Primary Cardiologist: Dr Angelena Form Referring Physician: Daune Perch NP   Melissa Mcdaniel is a 85 y.o. female with a history of persistent atrial fibrillation, HTN, CAD, chronic combined diastolic/systolic CHF, Crohn's diseaseandcolon cancer who presents for consultation in the [facility].  The patient was initially diagnosed with atrial fibrillation years ago and has done well on amiodarone. However, recently she has had elevated HR in the low 100's associated with fatigue and dyspnea. On follow up with Daune Perch, she was found to be in persistent atrial fibrillation. She is in afib today.  Today, she denies symptoms of chest pain, orthopnea, PND, lower extremity edema, dizziness, presyncope, syncope, snoring, daytime somnolence, bleeding, or neurologic sequela. The patient is tolerating medications without difficulties and is otherwise without complaint today.    Atrial Fibrillation Risk Factors:  she does not have symptoms or diagnosis of sleep apnea.  she has a BMI of Body mass index is 21.9 kg/m.Marland Kitchen Filed Weights   01/06/19 1348  Weight: 46.7 kg    Family History  Problem Relation Age of Onset  . Lung cancer Mother   . Alzheimer's disease Father   . Diabetes Maternal Grandmother      Atrial Fibrillation Management history:  Previous antiarrhythmic drugs: amiodarone Previous cardioversions: 2015 Previous ablations: none CHADS2VASC score: 5 Anticoagulation history: Eliquis   Past Medical History:  Diagnosis Date  . A-fib ([place])   . Chronic kidney disease   . Congestive heart failure (CHF) ([place]) 05/2016  . Crohn's disease ([place])   . Dysrhythmia    afib dx approx. 2016 per pt  . Hip fracture ([place])   . Hypertension    Past Surgical History:  Procedure Laterality Date  . FEMUR IM NAIL Right 07/26/2016   Procedure: INTRAMEDULLARY (IM) NAIL FEMORAL;  Surgeon: Renette Butters, MD;   Location: [facility];  Service: Orthopedics;  Laterality: Right;  . FRACTURE SURGERY    . large intestine - partial removal  approx 1993   due to crohns    Current Outpatient Medications  Medication Sig Dispense Refill  . albuterol (ACCUNEB) 1.25 MG/3ML nebulizer solution Take 3 mLs (1.25 mg total) by nebulization every 6 (six) hours as needed for wheezing or shortness of breath. 75 mL 0  . alendronate (FOSAMAX) 70 MG tablet Take 70 mg by mouth once a week. Take with a full glass of water on an empty stomach. Saturdays    . amiodarone (PACERONE) 200 MG tablet Take 1 tablet (200 mg total) by mouth 2 (two) times daily. 90 tablet 2  . carvedilol (COREG) 25 MG tablet Take 1 tablet (25 mg total) by mouth 2 (two) times daily. (Patient taking differently: Take 12.5 mg by mouth 2 (two) times daily. ) 180 tablet 3  . Cholecalciferol (VITAMIN D3) 2000 units TABS Take 2,000 Units by mouth daily.     . clonazePAM (KLONOPIN) 1 MG tablet Take 0.5 mg by mouth 2 (two) times daily. .5 mg twice a day as needed    . ELIQUIS 2.5 MG TABS tablet TAKE 1 TABLET TWICE DAILY 180 tablet 2  . Ferrous Sulfate (IRON) 28 MG TABS Take 1 tablet by mouth daily.    . furosemide (LASIX) 20 MG tablet Take 20 mg by mouth daily as needed.    . hydrALAZINE (APRESOLINE) 25 MG tablet Take 25 mg by mouth 2 (two) times daily.     Marland Kitchen latanoprost (XALATAN) 0.005 % ophthalmic solution Place 1 drop  into both eyes at bedtime.    Marland Kitchen loperamide (IMODIUM) 2 MG capsule Take 4 mg by mouth 4 (four) times daily as needed for diarrhea or loose stools.    . mirtazapine (REMERON) 15 MG tablet Take 15 mg by mouth at bedtime.     . potassium chloride (K-DUR) 10 MEQ tablet Take 10 mEq by mouth daily as needed. Take with lasix as needed for weight gain.    Marland Kitchen venlafaxine XR (EFFEXOR-XR) 150 MG 24 hr capsule Take 150 mg by mouth 2 (two) times daily.     No current facility-administered medications for this encounter.     Allergies  Allergen Reactions  .  Penicillins Swelling    Has patient had a PCN reaction causing immediate rash, facial/tongue/throat swelling, SOB or lightheadedness with hypotension: Yes Has patient had a PCN reaction causing severe rash involving mucus membranes or skin necrosis: No Has patient had a PCN reaction that required hospitalization: No Has patient had a PCN reaction occurring within the last 10 years: No If all of the above answers are "NO", then may proceed with Cephalosporin use.     Social History   Socioeconomic History  . Marital status: Married    Spouse name: Not on file  . Number of children: 3  . Years of education: Not on file  . Highest education level: Not on file  Occupational History  . Occupation: Worked at [employer]  . Financial resource strain: Not on file  . Food insecurity    Worry: Not on file    Inability: Not on file  . Transportation needs    Medical: Not on file    Non-medical: Not on file  Tobacco Use  . Smoking status: Former Smoker    Packs/day: 0.50    Years: 30.00    Pack years: 15.00    Types: Cigarettes    Quit date: 09/15/1975    Years since quitting: 43.3  . Smokeless tobacco: Never Used  Substance and Sexual Activity  . Alcohol use: No  . Drug use: No  . Sexual activity: Not on file  Lifestyle  . Physical activity    Days per week: Not on file    Minutes per session: Not on file  . Stress: Not on file  Relationships  . Social Herbalist on phone: Not on file    Gets together: Not on file    Attends religious service: Not on file    Active member of club or organization: Not on file    Attends meetings of clubs or organizations: Not on file    Relationship status: Not on file  . Intimate partner violence    Fear of current or ex partner: Not on file    Emotionally abused: Not on file    Physically abused: Not on file    Forced sexual activity: Not on file  Other Topics Concern  . Not on file  Social History Narrative   . Not on file     ROS- All systems are reviewed and negative except as per the HPI above.  Physical Exam: Vitals:   01/06/19 1348  BP: (!) 148/76  Pulse: (!) 101  Weight: 46.7 kg  Height: 4' 9.5" (1.461 m)    GEN- The patient is well appearing elderly female, alert and oriented x 3 today.   Head- normocephalic, atraumatic Eyes-  Sclera clear, conjunctiva pink Ears- hearing intact Oropharynx- clear Neck- supple  Lungs- Clear to  ausculation bilaterally, normal work of breathing Heart- irregular rate and rhythm, no murmurs, rubs or gallops  GI- soft, NT, ND, + BS Extremities- no clubbing, cyanosis, or edema MS- no significant deformity or atrophy Skin- no rash or lesion Psych- euthymic mood, full affect Neuro- strength and sensation are intact  Wt Readings from Last 3 Encounters:  01/06/19 46.7 kg  12/23/18 46.2 kg  11/12/18 44.8 kg    EKG today demonstrates afib HR 101, LVH, QRS 96, QTc 487.  Echo 03/31/2018 demonstrated  - Left ventricle: The cavity size was normal. There was mild   concentric hypertrophy. Systolic function was normal. The   estimated ejection fraction was in the range of 55% to 60%. Wall   motion was normal; there were no regional wall motion   abnormalities. Features are consistent with a pseudonormal left   ventricular filling pattern, with concomitant abnormal relaxation   and increased filling pressure (grade 2 diastolic dysfunction).   Doppler parameters are consistent with high ventricular filling   pressure. - Aortic valve: Transvalvular velocity was within the normal range.   There was no stenosis. There was mild regurgitation. Valve area   (VTI): 1.45 cm^2. Valve area (Vmax): 1.56 cm^2. Valve area   (Vmean): 1.53 cm^2. - Mitral valve: Transvalvular velocity was within the normal range.   There was no evidence for stenosis. There was trivial   regurgitation. - Left atrium: The atrium was severely dilated. - Right ventricle: The cavity  size was normal. Wall thickness was   normal. Systolic function was normal. - Atrial septum: No defect or patent foramen ovale was identified   by color flow Doppler. - Tricuspid valve: There was mild regurgitation. - Pulmonary arteries: Systolic pressure was within the normal   range. PA peak pressure: 35 mm Hg (S).  Epic records are reviewed at length today  Assessment and Plan:  1. Persistent atrial fibrillation The patient has persistent atrial fibrillation with symptoms of fatigue and SOB. Discussed therapeutic options including increasing amiodarone vs DCCV. Patient would like to try increasing amiodarone for now. Will increase amiodarone to 200 mg BID. If patient is still in afib on f/u, will consider DCCV. Continue Coreg 12.5 mg BID. No room to increase 2/2 hypotension in the past. Continue Eliquis 2.5 mg BID. Patient reports no missed doses.  This patients CHA2DS2-VASc Score and unadjusted Ischemic Stroke Rate (% per year) is equal to 7.2 % stroke rate/year from a score of 5  Above score calculated as 1 point each if present [CHF, HTN, DM, Vascular=MI/PAD/Aortic Plaque, Age if 65-74, or Female] Above score calculated as 2 points each if present [Age > 75, or Stroke/TIA/TE]   2. Combined chronic systolic and diastolic CHF No signs of fluid overload. Continue present therapy.  3. HTN Stable, no changes today.  4. CAD No anginal symptoms. Continue present therapy.    Follow up in AF clinic in 1-2 weeks.   [facility] [address] [place], [place] [address] [phone] 01/06/2019 5:03 PM

## 2019-01-08 ENCOUNTER — Telehealth (HOSPITAL_COMMUNITY): Payer: Self-pay | Admitting: *Deleted

## 2019-01-08 MED ORDER — DILTIAZEM HCL 30 MG PO TABS: ORAL_TABLET | ORAL | 2 refills | Status: DC

## 2019-01-08 NOTE — Telephone Encounter (Signed)
Patient called in stating since increasing her amiodarone she is actually having higher HRs at home - The last 2 days her HRs have been in the 120s. Her BP is 134/82 she denies edema. She feels no different but the HRs are concerning to her. Will discuss with Adline Peals PA and be back in touch with pt if medication additions/changes are recommended.

## 2019-01-08 NOTE — Telephone Encounter (Signed)
Per Adline Peals, PA can try adding PRN cardizem 35m for elevated HR as long as SBP over 100. Pt verbalized understanding. Will call if further issues before follow up.

## 2019-01-15 ENCOUNTER — Encounter (HOSPITAL_COMMUNITY): Payer: Self-pay | Admitting: Physician Assistant

## 2019-01-15 ENCOUNTER — Ambulatory Visit (HOSPITAL_COMMUNITY)
Admission: RE | Admit: 2019-01-15 | Discharge: 2019-01-15 | Disposition: A | Discharge status: Home / Self Care | Payer: Medicare Other | Source: Ambulatory Visit | Attending: Nurse Practitioner | Admitting: Nurse Practitioner

## 2019-01-15 ENCOUNTER — Other Ambulatory Visit: Payer: Self-pay

## 2019-01-15 VITALS — BP 120/64 | HR 84 | Ht <= 58 in | Wt 103.0 lb

## 2019-01-15 DIAGNOSIS — Z79899 Other long term (current) drug therapy: Secondary | ICD-10-CM | POA: Insufficient documentation

## 2019-01-15 DIAGNOSIS — Z7901 Long term (current) use of anticoagulants: Secondary | ICD-10-CM | POA: Insufficient documentation

## 2019-01-15 DIAGNOSIS — Z87891 Personal history of nicotine dependence: Secondary | ICD-10-CM | POA: Diagnosis not present

## 2019-01-15 DIAGNOSIS — Z880 Allergy status to penicillin: Secondary | ICD-10-CM | POA: Diagnosis not present

## 2019-01-15 DIAGNOSIS — I130 Hypertensive heart and chronic kidney disease with heart failure and stage 1 through stage 4 chronic kidney disease, or unspecified chronic kidney disease: Secondary | ICD-10-CM | POA: Insufficient documentation

## 2019-01-15 DIAGNOSIS — N189 Chronic kidney disease, unspecified: Secondary | ICD-10-CM | POA: Diagnosis not present

## 2019-01-15 DIAGNOSIS — I4819 Other persistent atrial fibrillation: Secondary | ICD-10-CM | POA: Insufficient documentation

## 2019-01-15 DIAGNOSIS — I2510 Atherosclerotic heart disease of native coronary artery without angina pectoris: Secondary | ICD-10-CM | POA: Insufficient documentation

## 2019-01-15 DIAGNOSIS — K5090 Crohn's disease, unspecified, without complications: Secondary | ICD-10-CM | POA: Diagnosis not present

## 2019-01-15 DIAGNOSIS — I5042 Chronic combined systolic (congestive) and diastolic (congestive) heart failure: Secondary | ICD-10-CM | POA: Insufficient documentation

## 2019-01-15 NOTE — H&P (View-Only) (Signed)
Primary Care Physician: Harlan Stains, MD Primary Cardiologist: Dr Angelena Form Referring Physician: Daune Perch NP   Melissa Mcdaniel is a 85 y.o. female with a history of persistent atrial fibrillation, HTN, CAD, chronic combined diastolic/systolic CHF, Crohn's diseaseandcolon cancer who presents for consultation in the [facility].  The patient was initially diagnosed with atrial fibrillation years ago and has done well on amiodarone. However, recently she has had elevated HR in the low 100's associated with fatigue and dyspnea. On follow up with Daune Perch, she was found to be in persistent atrial fibrillation.   On follow up today, patient reports that her heart rate has been better controlled at home with readings in the 90s. She continues to have SOB with activity although this has also improved with better rate control.   Today, she denies symptoms of chest pain, orthopnea, PND, lower extremity edema, dizziness, presyncope, syncope, snoring, daytime somnolence, bleeding, or neurologic sequela. The patient is tolerating medications without difficulties and is otherwise without complaint today.    Atrial Fibrillation Risk Factors:  she does not have symptoms or diagnosis of sleep apnea.  she has a BMI of There is no height or weight on file to calculate BMI.. There were no vitals filed for this visit.  Family History  Problem Relation Age of Onset  . Lung cancer Mother   . Alzheimer's disease Father   . Diabetes Maternal Grandmother      Atrial Fibrillation Management history:  Previous antiarrhythmic drugs: amiodarone Previous cardioversions: 2015 Previous ablations: none CHADS2VASC score: 5 Anticoagulation history: Eliquis   Past Medical History:  Diagnosis Date  . A-fib ([place])   . Chronic kidney disease   . Congestive heart failure (CHF) ([place]) 05/2016  . Crohn's disease ([place])   . Dysrhythmia    afib dx approx. 2016 per pt   . Hip fracture ([place])   . Hypertension    Past Surgical History:  Procedure Laterality Date  . FEMUR IM NAIL Right 07/26/2016   Procedure: INTRAMEDULLARY (IM) NAIL FEMORAL;  Surgeon: Renette Butters, MD;  Location: [facility];  Service: Orthopedics;  Laterality: Right;  . FRACTURE SURGERY    . large intestine - partial removal  approx 1993   due to crohns    Current Outpatient Medications  Medication Sig Dispense Refill  . albuterol (ACCUNEB) 1.25 MG/3ML nebulizer solution Take 3 mLs (1.25 mg total) by nebulization every 6 (six) hours as needed for wheezing or shortness of breath. 75 mL 0  . alendronate (FOSAMAX) 70 MG tablet Take 70 mg by mouth once a week. Take with a full glass of water on an empty stomach. Saturdays    . amiodarone (PACERONE) 200 MG tablet Take 1 tablet (200 mg total) by mouth 2 (two) times daily. 90 tablet 2  . carvedilol (COREG) 25 MG tablet Take 1 tablet (25 mg total) by mouth 2 (two) times daily. (Patient taking differently: Take 12.5 mg by mouth 2 (two) times daily. ) 180 tablet 3  . Cholecalciferol (VITAMIN D3) 2000 units TABS Take 2,000 Units by mouth daily.     . clonazePAM (KLONOPIN) 1 MG tablet Take 0.5 mg by mouth 2 (two) times daily. .5 mg twice a day as needed    . diltiazem (CARDIZEM) 30 MG tablet Take 1 tablet every 4 hours AS NEEDED for heart rate >110 as long as top blood pressure >100. 45 tablet 2  . ELIQUIS 2.5 MG TABS tablet TAKE 1 TABLET TWICE DAILY 180  tablet 2  . Ferrous Sulfate (IRON) 28 MG TABS Take 1 tablet by mouth daily.    . furosemide (LASIX) 20 MG tablet Take 20 mg by mouth daily as needed.    . hydrALAZINE (APRESOLINE) 25 MG tablet Take 25 mg by mouth 2 (two) times daily.     Marland Kitchen latanoprost (XALATAN) 0.005 % ophthalmic solution Place 1 drop into both eyes at bedtime.    Marland Kitchen loperamide (IMODIUM) 2 MG capsule Take 4 mg by mouth 4 (four) times daily as needed for diarrhea or loose stools.    . mirtazapine (REMERON) 15 MG tablet Take 15 mg by mouth  at bedtime.     . potassium chloride (K-DUR) 10 MEQ tablet Take 10 mEq by mouth daily as needed. Take with lasix as needed for weight gain.    Marland Kitchen venlafaxine XR (EFFEXOR-XR) 150 MG 24 hr capsule Take 150 mg by mouth 2 (two) times daily.     No current facility-administered medications for this visit.     Allergies  Allergen Reactions  . Penicillins Swelling    Has patient had a PCN reaction causing immediate rash, facial/tongue/throat swelling, SOB or lightheadedness with hypotension: Yes Has patient had a PCN reaction causing severe rash involving mucus membranes or skin necrosis: No Has patient had a PCN reaction that required hospitalization: No Has patient had a PCN reaction occurring within the last 10 years: No If all of the above answers are "NO", then may proceed with Cephalosporin use.     Social History   Socioeconomic History  . Marital status: Married    Spouse name: Not on file  . Number of children: 3  . Years of education: Not on file  . Highest education level: Not on file  Occupational History  . Occupation: Worked at [employer]  . Financial resource strain: Not on file  . Food insecurity    Worry: Not on file    Inability: Not on file  . Transportation needs    Medical: Not on file    Non-medical: Not on file  Tobacco Use  . Smoking status: Former Smoker    Packs/day: 0.50    Years: 30.00    Pack years: 15.00    Types: Cigarettes    Quit date: 09/15/1975    Years since quitting: 43.3  . Smokeless tobacco: Never Used  Substance and Sexual Activity  . Alcohol use: No  . Drug use: No  . Sexual activity: Not on file  Lifestyle  . Physical activity    Days per week: Not on file    Minutes per session: Not on file  . Stress: Not on file  Relationships  . Social Herbalist on phone: Not on file    Gets together: Not on file    Attends religious service: Not on file    Active member of club or organization: Not on file     Attends meetings of clubs or organizations: Not on file    Relationship status: Not on file  . Intimate partner violence    Fear of current or ex partner: Not on file    Emotionally abused: Not on file    Physically abused: Not on file    Forced sexual activity: Not on file  Other Topics Concern  . Not on file  Social History Narrative  . Not on file     ROS- All systems are reviewed and negative except as per the HPI above.  Physical Exam: There were no vitals filed for this visit.  GEN- The patient is well appearing elderly female, alert and oriented x 3 today.   HEENT-head normocephalic, atraumatic, sclera clear, conjunctiva pink, hearing intact, trachea midline. Lungs- Clear to ausculation bilaterally, normal work of breathing Heart- irregular rate and rhythm, no murmurs, rubs or gallops  GI- soft, NT, ND, + BS Extremities- no clubbing, cyanosis, or edema MS- no significant deformity or atrophy Skin- no rash or lesion Psych- euthymic mood, full affect Neuro- strength and sensation are intact   Wt Readings from Last 3 Encounters:  01/06/19 103 lb (46.7 kg)  12/23/18 101 lb 12.8 oz (46.2 kg)  11/12/18 98 lb 12.8 oz (44.8 kg)    EKG today demonstrates afib HR 84, LVH, QRS 92, QTc 467  Echo 03/31/2018 demonstrated  - Left ventricle: The cavity size was normal. There was mild   concentric hypertrophy. Systolic function was normal. The   estimated ejection fraction was in the range of 55% to 60%. Wall   motion was normal; there were no regional wall motion   abnormalities. Features are consistent with a pseudonormal left   ventricular filling pattern, with concomitant abnormal relaxation   and increased filling pressure (grade 2 diastolic dysfunction).   Doppler parameters are consistent with high ventricular filling   pressure. - Aortic valve: Transvalvular velocity was within the normal range.   There was no stenosis. There was mild regurgitation. Valve area   (VTI):  1.45 cm^2. Valve area (Vmax): 1.56 cm^2. Valve area   (Vmean): 1.53 cm^2. - Mitral valve: Transvalvular velocity was within the normal range.   There was no evidence for stenosis. There was trivial   regurgitation. - Left atrium: The atrium was severely dilated. - Right ventricle: The cavity size was normal. Wall thickness was   normal. Systolic function was normal. - Atrial septum: No defect or patent foramen ovale was identified   by color flow Doppler. - Tricuspid valve: There was mild regurgitation. - Pulmonary arteries: Systolic pressure was within the normal   range. PA peak pressure: 35 mm Hg (S).  Epic records are reviewed at length today  Assessment and Plan:  1. Persistent atrial fibrillation The patient has persistent atrial fibrillation with symptoms of fatigue and SOB. Discussed therapeutic options including DCCV vs rate control again today. Patient and her husband would like to take a few days to consider their options.  Continue amiodarone to 200 mg BID. Continue Coreg 12.5 mg BID. No room to increase 2/2 hypotension in the past. Continue Eliquis 2.5 mg BID. Patient reports no missed doses.  This patients CHA2DS2-VASc Score and unadjusted Ischemic Stroke Rate (% per year) is equal to 7.2 % stroke rate/year from a score of 5  Above score calculated as 1 point each if present [CHF, HTN, DM, Vascular=MI/PAD/Aortic Plaque, Age if 65-74, or Female] Above score calculated as 2 points each if present [Age > 75, or Stroke/TIA/TE]   2. Combined chronic systolic and diastolic CHF No signs or symptoms of fluid overload. Continue present therapy.  3. HTN Stable, no changes today.  4. CAD No anginal symptoms. Continue present therapy.   Follow up with AF clinic in 3 weeks or one week post DCCV if patient decides to pursue that route.    Addendum: Patient called clinic with decision to proceed with DCCV. Will arrange.   [facility] [address] [place], [place] [address] [phone] 01/15/2019 8:29 AM

## 2019-01-15 NOTE — Progress Notes (Addendum)
Primary Care Physician: Harlan Stains, MD Primary Cardiologist: Dr Angelena Form Referring Physician: Daune Perch NP   Melissa Mcdaniel is a 85 y.o. female with a history of persistent atrial fibrillation, HTN, CAD, chronic combined diastolic/systolic CHF, Crohn's diseaseandcolon cancer who presents for consultation in the [facility].  The patient was initially diagnosed with atrial fibrillation years ago and has done well on amiodarone. However, recently she has had elevated HR in the low 100's associated with fatigue and dyspnea. On follow up with Daune Perch, she was found to be in persistent atrial fibrillation.   On follow up today, patient reports that her heart rate has been better controlled at home with readings in the 90s. She continues to have SOB with activity although this has also improved with better rate control.   Today, she denies symptoms of chest pain, orthopnea, PND, lower extremity edema, dizziness, presyncope, syncope, snoring, daytime somnolence, bleeding, or neurologic sequela. The patient is tolerating medications without difficulties and is otherwise without complaint today.    Atrial Fibrillation Risk Factors:  she does not have symptoms or diagnosis of sleep apnea.  she has a BMI of There is no height or weight on file to calculate BMI.. There were no vitals filed for this visit.  Family History  Problem Relation Age of Onset  . Lung cancer Mother   . Alzheimer's disease Father   . Diabetes Maternal Grandmother      Atrial Fibrillation Management history:  Previous antiarrhythmic drugs: amiodarone Previous cardioversions: 2015 Previous ablations: none CHADS2VASC score: 5 Anticoagulation history: Eliquis   Past Medical History:  Diagnosis Date  . A-fib ([place])   . Chronic kidney disease   . Congestive heart failure (CHF) ([place]) 05/2016  . Crohn's disease ([place])   . Dysrhythmia    afib dx approx. 2016 per pt   . Hip fracture ([place])   . Hypertension    Past Surgical History:  Procedure Laterality Date  . FEMUR IM NAIL Right 07/26/2016   Procedure: INTRAMEDULLARY (IM) NAIL FEMORAL;  Surgeon: Renette Butters, MD;  Location: [facility];  Service: Orthopedics;  Laterality: Right;  . FRACTURE SURGERY    . large intestine - partial removal  approx 1993   due to crohns    Current Outpatient Medications  Medication Sig Dispense Refill  . albuterol (ACCUNEB) 1.25 MG/3ML nebulizer solution Take 3 mLs (1.25 mg total) by nebulization every 6 (six) hours as needed for wheezing or shortness of breath. 75 mL 0  . alendronate (FOSAMAX) 70 MG tablet Take 70 mg by mouth once a week. Take with a full glass of water on an empty stomach. Saturdays    . amiodarone (PACERONE) 200 MG tablet Take 1 tablet (200 mg total) by mouth 2 (two) times daily. 90 tablet 2  . carvedilol (COREG) 25 MG tablet Take 1 tablet (25 mg total) by mouth 2 (two) times daily. (Patient taking differently: Take 12.5 mg by mouth 2 (two) times daily. ) 180 tablet 3  . Cholecalciferol (VITAMIN D3) 2000 units TABS Take 2,000 Units by mouth daily.     . clonazePAM (KLONOPIN) 1 MG tablet Take 0.5 mg by mouth 2 (two) times daily. .5 mg twice a day as needed    . diltiazem (CARDIZEM) 30 MG tablet Take 1 tablet every 4 hours AS NEEDED for heart rate >110 as long as top blood pressure >100. 45 tablet 2  . ELIQUIS 2.5 MG TABS tablet TAKE 1 TABLET TWICE DAILY 180  tablet 2  . Ferrous Sulfate (IRON) 28 MG TABS Take 1 tablet by mouth daily.    . furosemide (LASIX) 20 MG tablet Take 20 mg by mouth daily as needed.    . hydrALAZINE (APRESOLINE) 25 MG tablet Take 25 mg by mouth 2 (two) times daily.     Marland Kitchen latanoprost (XALATAN) 0.005 % ophthalmic solution Place 1 drop into both eyes at bedtime.    Marland Kitchen loperamide (IMODIUM) 2 MG capsule Take 4 mg by mouth 4 (four) times daily as needed for diarrhea or loose stools.    . mirtazapine (REMERON) 15 MG tablet Take 15 mg by mouth  at bedtime.     . potassium chloride (K-DUR) 10 MEQ tablet Take 10 mEq by mouth daily as needed. Take with lasix as needed for weight gain.    Marland Kitchen venlafaxine XR (EFFEXOR-XR) 150 MG 24 hr capsule Take 150 mg by mouth 2 (two) times daily.     No current facility-administered medications for this visit.     Allergies  Allergen Reactions  . Penicillins Swelling    Has patient had a PCN reaction causing immediate rash, facial/tongue/throat swelling, SOB or lightheadedness with hypotension: Yes Has patient had a PCN reaction causing severe rash involving mucus membranes or skin necrosis: No Has patient had a PCN reaction that required hospitalization: No Has patient had a PCN reaction occurring within the last 10 years: No If all of the above answers are "NO", then may proceed with Cephalosporin use.     Social History   Socioeconomic History  . Marital status: Married    Spouse name: Not on file  . Number of children: 3  . Years of education: Not on file  . Highest education level: Not on file  Occupational History  . Occupation: Worked at [employer]  . Financial resource strain: Not on file  . Food insecurity    Worry: Not on file    Inability: Not on file  . Transportation needs    Medical: Not on file    Non-medical: Not on file  Tobacco Use  . Smoking status: Former Smoker    Packs/day: 0.50    Years: 30.00    Pack years: 15.00    Types: Cigarettes    Quit date: 09/15/1975    Years since quitting: 43.3  . Smokeless tobacco: Never Used  Substance and Sexual Activity  . Alcohol use: No  . Drug use: No  . Sexual activity: Not on file  Lifestyle  . Physical activity    Days per week: Not on file    Minutes per session: Not on file  . Stress: Not on file  Relationships  . Social Herbalist on phone: Not on file    Gets together: Not on file    Attends religious service: Not on file    Active member of club or organization: Not on file     Attends meetings of clubs or organizations: Not on file    Relationship status: Not on file  . Intimate partner violence    Fear of current or ex partner: Not on file    Emotionally abused: Not on file    Physically abused: Not on file    Forced sexual activity: Not on file  Other Topics Concern  . Not on file  Social History Narrative  . Not on file     ROS- All systems are reviewed and negative except as per the HPI above.  Physical Exam: There were no vitals filed for this visit.  GEN- The patient is well appearing elderly female, alert and oriented x 3 today.   HEENT-head normocephalic, atraumatic, sclera clear, conjunctiva pink, hearing intact, trachea midline. Lungs- Clear to ausculation bilaterally, normal work of breathing Heart- irregular rate and rhythm, no murmurs, rubs or gallops  GI- soft, NT, ND, + BS Extremities- no clubbing, cyanosis, or edema MS- no significant deformity or atrophy Skin- no rash or lesion Psych- euthymic mood, full affect Neuro- strength and sensation are intact   Wt Readings from Last 3 Encounters:  01/06/19 103 lb (46.7 kg)  12/23/18 101 lb 12.8 oz (46.2 kg)  11/12/18 98 lb 12.8 oz (44.8 kg)    EKG today demonstrates afib HR 84, LVH, QRS 92, QTc 467  Echo 03/31/2018 demonstrated  - Left ventricle: The cavity size was normal. There was mild   concentric hypertrophy. Systolic function was normal. The   estimated ejection fraction was in the range of 55% to 60%. Wall   motion was normal; there were no regional wall motion   abnormalities. Features are consistent with a pseudonormal left   ventricular filling pattern, with concomitant abnormal relaxation   and increased filling pressure (grade 2 diastolic dysfunction).   Doppler parameters are consistent with high ventricular filling   pressure. - Aortic valve: Transvalvular velocity was within the normal range.   There was no stenosis. There was mild regurgitation. Valve area   (VTI):  1.45 cm^2. Valve area (Vmax): 1.56 cm^2. Valve area   (Vmean): 1.53 cm^2. - Mitral valve: Transvalvular velocity was within the normal range.   There was no evidence for stenosis. There was trivial   regurgitation. - Left atrium: The atrium was severely dilated. - Right ventricle: The cavity size was normal. Wall thickness was   normal. Systolic function was normal. - Atrial septum: No defect or patent foramen ovale was identified   by color flow Doppler. - Tricuspid valve: There was mild regurgitation. - Pulmonary arteries: Systolic pressure was within the normal   range. PA peak pressure: 35 mm Hg (S).  Epic records are reviewed at length today  Assessment and Plan:  1. Persistent atrial fibrillation The patient has persistent atrial fibrillation with symptoms of fatigue and SOB. Discussed therapeutic options including DCCV vs rate control again today. Patient and her husband would like to take a few days to consider their options.  Continue amiodarone to 200 mg BID. Continue Coreg 12.5 mg BID. No room to increase 2/2 hypotension in the past. Continue Eliquis 2.5 mg BID. Patient reports no missed doses.  This patients CHA2DS2-VASc Score and unadjusted Ischemic Stroke Rate (% per year) is equal to 7.2 % stroke rate/year from a score of 5  Above score calculated as 1 point each if present [CHF, HTN, DM, Vascular=MI/PAD/Aortic Plaque, Age if 65-74, or Female] Above score calculated as 2 points each if present [Age > 75, or Stroke/TIA/TE]   2. Combined chronic systolic and diastolic CHF No signs or symptoms of fluid overload. Continue present therapy.  3. HTN Stable, no changes today.  4. CAD No anginal symptoms. Continue present therapy.   Follow up with AF clinic in 3 weeks or one week post DCCV if patient decides to pursue that route.    Addendum: Patient called clinic with decision to proceed with DCCV. Will arrange.   [facility] [address] [place], [address] [address] [phone] 01/15/2019 8:29 AM

## 2019-01-19 ENCOUNTER — Other Ambulatory Visit (HOSPITAL_COMMUNITY): Payer: Self-pay | Admitting: *Deleted

## 2019-01-19 ENCOUNTER — Encounter (HOSPITAL_COMMUNITY): Payer: Self-pay | Admitting: *Deleted

## 2019-01-19 DIAGNOSIS — I4819 Other persistent atrial fibrillation: Secondary | ICD-10-CM

## 2019-01-19 NOTE — Addendum Note (Signed)
Encounter addended by: Oliver Barre, PA on: 01/19/2019 2:05 PM  Actions taken: Clinical Note Signed

## 2019-01-29 ENCOUNTER — Other Ambulatory Visit: Payer: Self-pay | Admitting: Family Medicine

## 2019-01-29 DIAGNOSIS — R911 Solitary pulmonary nodule: Secondary | ICD-10-CM

## 2019-01-30 ENCOUNTER — Other Ambulatory Visit (HOSPITAL_COMMUNITY): Payer: Self-pay | Admitting: Physician Assistant

## 2019-01-30 ENCOUNTER — Other Ambulatory Visit (HOSPITAL_COMMUNITY): Payer: Medicare Other

## 2019-01-30 DIAGNOSIS — I4819 Other persistent atrial fibrillation: Secondary | ICD-10-CM | POA: Diagnosis not present

## 2019-01-31 ENCOUNTER — Other Ambulatory Visit (HOSPITAL_COMMUNITY)
Admission: RE | Admit: 2019-01-31 | Discharge: 2019-01-31 | Disposition: A | Discharge status: Home / Self Care | Payer: Medicare Other | Source: Ambulatory Visit | Attending: Cardiology | Admitting: Cardiology

## 2019-01-31 DIAGNOSIS — Z01812 Encounter for preprocedural laboratory examination: Secondary | ICD-10-CM | POA: Insufficient documentation

## 2019-01-31 DIAGNOSIS — Z1159 Encounter for screening for other viral diseases: Secondary | ICD-10-CM | POA: Diagnosis not present

## 2019-01-31 LAB — BASIC METABOLIC PANEL
BUN/Creatinine Ratio: 13 (ref 12–28)
BUN: 23 mg/dL (ref 8–27)
CO2: 22 mmol/L (ref 20–29)
Calcium: 8.8 mg/dL (ref 8.7–10.3)
Chloride: 103 mmol/L (ref 96–106)
Creatinine, Ser: 1.73 mg/dL — ABNORMAL HIGH (ref 0.57–1.00)
GFR calc Af Amer: 31 mL/min/{1.73_m2} — ABNORMAL LOW (ref >59)
GFR calc non Af Amer: 27 mL/min/{1.73_m2} — ABNORMAL LOW (ref >59)
Glucose: 76 mg/dL (ref 65–99)
Potassium: 5.6 mmol/L — ABNORMAL HIGH (ref 3.5–5.2)
Sodium: 137 mmol/L (ref 134–144)

## 2019-01-31 LAB — SARS CORONAVIRUS 2 (TAT 6-24 HRS): SARS Coronavirus 2: NEGATIVE

## 2019-01-31 LAB — CBC WITH DIFFERENTIAL/PLATELET
Basophils Absolute: 0.1 10*3/uL (ref 0.0–0.2)
Basos: 1 %
EOS (ABSOLUTE): 0.1 10*3/uL (ref 0.0–0.4)
Eos: 2 %
Hematocrit: 31.5 % — ABNORMAL LOW (ref 34.0–46.6)
Hemoglobin: 10 g/dL — ABNORMAL LOW (ref 11.1–15.9)
Immature Grans (Abs): 0 10*3/uL (ref 0.0–0.1)
Immature Granulocytes: 0 %
Lymphocytes Absolute: 0.8 10*3/uL (ref 0.7–3.1)
Lymphs: 14 %
MCH: 27.4 pg (ref 26.6–33.0)
MCHC: 31.7 g/dL (ref 31.5–35.7)
MCV: 86 fL (ref 79–97)
Monocytes Absolute: 0.5 10*3/uL (ref 0.1–0.9)
Monocytes: 9 %
Neutrophils Absolute: 4.4 10*3/uL (ref 1.4–7.0)
Neutrophils: 74 %
Platelets: 243 10*3/uL (ref 150–450)
RBC: 3.65 x10E6/uL — ABNORMAL LOW (ref 3.77–5.28)
RDW: 13 % (ref 11.7–15.4)
WBC: 5.9 10*3/uL (ref 3.4–10.8)

## 2019-02-02 ENCOUNTER — Other Ambulatory Visit: Payer: Self-pay | Admitting: Cardiovascular Disease

## 2019-02-02 NOTE — Telephone Encounter (Signed)
Pt last saw Roderic Palau, NP on 01/15/19, last labs 01/30/19 Creat 1.73, age 85, weight 46.2, based on specified criteria pt is on appropriate dosage of Eliquis 2.67m BID.  Will refill rx.

## 2019-02-03 ENCOUNTER — Ambulatory Visit (HOSPITAL_COMMUNITY): Payer: Medicare Other | Admitting: Physician Assistant

## 2019-02-04 ENCOUNTER — Encounter (HOSPITAL_COMMUNITY)
Admission: RE | Disposition: A | Discharge status: Home / Self Care | Payer: Self-pay | Source: Home / Self Care | Attending: Cardiology

## 2019-02-04 ENCOUNTER — Ambulatory Visit (HOSPITAL_COMMUNITY)
Admission: RE | Admit: 2019-02-04 | Discharge: 2019-02-04 | Disposition: A | Discharge status: Home / Self Care | Payer: Medicare Other | Attending: Cardiology | Admitting: Cardiology

## 2019-02-04 ENCOUNTER — Ambulatory Visit (HOSPITAL_COMMUNITY): Payer: Medicare Other | Admitting: Certified Registered Nurse Anesthetist

## 2019-02-04 ENCOUNTER — Encounter (HOSPITAL_COMMUNITY): Payer: Self-pay | Admitting: *Deleted

## 2019-02-04 DIAGNOSIS — I2510 Atherosclerotic heart disease of native coronary artery without angina pectoris: Secondary | ICD-10-CM | POA: Diagnosis not present

## 2019-02-04 DIAGNOSIS — I110 Hypertensive heart disease with heart failure: Secondary | ICD-10-CM | POA: Diagnosis not present

## 2019-02-04 DIAGNOSIS — I130 Hypertensive heart and chronic kidney disease with heart failure and stage 1 through stage 4 chronic kidney disease, or unspecified chronic kidney disease: Secondary | ICD-10-CM | POA: Diagnosis not present

## 2019-02-04 DIAGNOSIS — N189 Chronic kidney disease, unspecified: Secondary | ICD-10-CM | POA: Diagnosis not present

## 2019-02-04 DIAGNOSIS — I4891 Unspecified atrial fibrillation: Secondary | ICD-10-CM | POA: Diagnosis not present

## 2019-02-04 DIAGNOSIS — K5090 Crohn's disease, unspecified, without complications: Secondary | ICD-10-CM | POA: Insufficient documentation

## 2019-02-04 DIAGNOSIS — D649 Anemia, unspecified: Secondary | ICD-10-CM | POA: Diagnosis not present

## 2019-02-04 DIAGNOSIS — Z79899 Other long term (current) drug therapy: Secondary | ICD-10-CM | POA: Diagnosis not present

## 2019-02-04 DIAGNOSIS — I4819 Other persistent atrial fibrillation: Secondary | ICD-10-CM | POA: Diagnosis not present

## 2019-02-04 DIAGNOSIS — Z87891 Personal history of nicotine dependence: Secondary | ICD-10-CM | POA: Insufficient documentation

## 2019-02-04 DIAGNOSIS — Z7901 Long term (current) use of anticoagulants: Secondary | ICD-10-CM | POA: Insufficient documentation

## 2019-02-04 DIAGNOSIS — I5042 Chronic combined systolic (congestive) and diastolic (congestive) heart failure: Secondary | ICD-10-CM | POA: Diagnosis not present

## 2019-02-04 HISTORY — PX: CARDIOVERSION: SHX1299

## 2019-02-04 SURGERY — CARDIOVERSION
Anesthesia: General

## 2019-02-04 MED ORDER — SODIUM CHLORIDE 0.9 % IV SOLN
INTRAVENOUS | Status: DC
  Administered 2019-02-04: 11:00:00 via INTRAVENOUS

## 2019-02-04 MED ORDER — PROPOFOL 10 MG/ML IV BOLUS
INTRAVENOUS | Status: DC | PRN
  Administered 2019-02-04: 50 mg via INTRAVENOUS

## 2019-02-04 MED ORDER — LIDOCAINE 2% (20 MG/ML) 5 ML SYRINGE
INTRAMUSCULAR | Status: DC | PRN
  Administered 2019-02-04: 60 mg via INTRAVENOUS

## 2019-02-04 NOTE — Discharge Instructions (Signed)

## 2019-02-04 NOTE — Transfer of Care (Signed)
Immediate Anesthesia Transfer of Care Note  Patient: Melissa Mcdaniel  Procedure(s) Performed: CARDIOVERSION (N/A )  Patient Location: Endoscopy Unit  Anesthesia Type:MAC  Level of Consciousness: drowsy  Airway & Oxygen Therapy: Patient Spontanous Breathing  Post-op Assessment: Report given to RN and Post -op Vital signs reviewed and stable  Post vital signs: Reviewed and stable  Last Vitals:  Vitals Value Taken Time  BP 151/39 (81)   Temp    Pulse 56   Resp 16   SpO2 97     Last Pain:  Vitals:   02/04/19 1015  TempSrc: Temporal  PainSc: 0-No pain         Complications: No apparent anesthesia complications

## 2019-02-04 NOTE — Interval H&P Note (Signed)
History and Physical Interval Note:  02/04/2019 10:24 AM  Melissa Mcdaniel  has presented today for surgery, with the diagnosis of AFIB.  The various methods of treatment have been discussed with the patient and family. After consideration of risks, benefits and other options for treatment, the patient has consented to  Procedure(s): CARDIOVERSION (N/A) as a surgical intervention.  The patient's history has been reviewed, patient examined, no change in status, stable for surgery.  I have reviewed the patient's chart and labs.  Questions were answered to the patient's satisfaction.     Majesta Leichter Harrell Gave

## 2019-02-04 NOTE — Anesthesia Preprocedure Evaluation (Signed)
Anesthesia Evaluation  Patient identified by MRN, date of birth, ID band Patient awake    Reviewed: Allergy & Precautions, H&P , NPO status , Patient's Chart, lab work & pertinent test results  Airway Mallampati: II   Neck ROM: full    Dental   Pulmonary former smoker,    breath sounds clear to auscultation       Cardiovascular hypertension, +CHF  + dysrhythmias Atrial Fibrillation  Rhythm:irregular Rate:Normal     Neuro/Psych    GI/Hepatic Crohn's dz   Endo/Other    Renal/GU Renal InsufficiencyRenal disease     Musculoskeletal   Abdominal   Peds  Hematology  (+) Blood dyscrasia, anemia ,   Anesthesia Other Findings   Reproductive/Obstetrics                             Anesthesia Physical Anesthesia Plan  ASA: III  Anesthesia Plan: General   Post-op Pain Management:    Induction: Intravenous  PONV Risk Score and Plan: 3 and Propofol infusion and Treatment may vary due to age or medical condition  Airway Management Planned: Mask  Additional Equipment:   Intra-op Plan:   Post-operative Plan:   Informed Consent: I have reviewed the patients History and Physical, chart, labs and discussed the procedure including the risks, benefits and alternatives for the proposed anesthesia with the patient or authorized representative who has indicated his/her understanding and acceptance.       Plan Discussed with: CRNA, Anesthesiologist and Surgeon  Anesthesia Plan Comments:         Anesthesia Quick Evaluation

## 2019-02-04 NOTE — CV Procedure (Signed)
Procedure:   DCCV  Indication:  Symptomatic atrial fibrillation  Procedure Note:  The patient signed informed consent.  They have had had therapeutic anticoagulation with apixaban greater than 3 weeks.  Anesthesia was administered by Dr. Marcie Bal.  Adequate airway was maintained throughout and vital followed per protocol.  They were cardioverted x 1 with 120J of biphasic synchronized energy.  She received 60 mg lidocaine and 50 mg propofol. They converted to NSR.  There were no apparent complications.  The patient had normal neuro status and respiratory status post procedure with vitals stable as recorded elsewhere.    Follow up:  They will continue on current medical therapy and follow up with cardiology as scheduled.  Buford Dresser, MD PhD 02/04/2019 10:49 AM

## 2019-02-05 ENCOUNTER — Encounter (HOSPITAL_COMMUNITY): Payer: Self-pay | Admitting: Cardiology

## 2019-02-05 NOTE — Anesthesia Postprocedure Evaluation (Signed)
Anesthesia Post Note  Patient: [name]  Procedure(s) Performed: CARDIOVERSION (N/A )     Patient location during evaluation: Endoscopy Anesthesia Type: General Level of consciousness: awake and alert Pain management: pain level controlled Vital Signs Assessment: post-procedure vital signs reviewed and stable Respiratory status: spontaneous breathing, nonlabored ventilation, respiratory function stable and patient connected to nasal cannula oxygen Cardiovascular status: blood pressure returned to baseline and stable Postop Assessment: no apparent nausea or vomiting Anesthetic complications: no    Last Vitals:  Vitals:   02/04/19 1100 02/04/19 1110  BP: (!) 119/36 (!) 165/42  Pulse: (!) 56 (!) 56  Resp: 17 14  Temp:    SpO2: 98% 98%    Last Pain:  Vitals:   02/04/19 1110  TempSrc:   PainSc: 0-No pain                 Noel Rodier S

## 2019-02-06 ENCOUNTER — Other Ambulatory Visit (HOSPITAL_COMMUNITY): Payer: Self-pay | Admitting: *Deleted

## 2019-02-06 MED ORDER — AMIODARONE HCL 200 MG PO TABS: 200.0000 mg | ORAL_TABLET | Freq: Two times a day (BID) | ORAL | 1 refills | Status: DC

## 2019-02-09 ENCOUNTER — Other Ambulatory Visit: Payer: Self-pay

## 2019-02-09 ENCOUNTER — Ambulatory Visit
Admission: RE | Admit: 2019-02-09 | Discharge: 2019-02-09 | Disposition: A | Discharge status: Home / Self Care | Payer: Medicare Other | Source: Ambulatory Visit | Attending: Family Medicine | Admitting: Family Medicine

## 2019-02-09 DIAGNOSIS — R911 Solitary pulmonary nodule: Secondary | ICD-10-CM

## 2019-02-09 DIAGNOSIS — R918 Other nonspecific abnormal finding of lung field: Secondary | ICD-10-CM | POA: Diagnosis not present

## 2019-02-10 ENCOUNTER — Other Ambulatory Visit: Payer: Self-pay

## 2019-02-10 ENCOUNTER — Ambulatory Visit (HOSPITAL_COMMUNITY)
Admission: RE | Admit: 2019-02-10 | Discharge: 2019-02-10 | Disposition: A | Discharge status: Home / Self Care | Payer: Medicare Other | Source: Ambulatory Visit | Attending: Physician Assistant | Admitting: Physician Assistant

## 2019-02-10 ENCOUNTER — Other Ambulatory Visit (HOSPITAL_COMMUNITY): Payer: Self-pay | Admitting: *Deleted

## 2019-02-10 VITALS — BP 136/60 | HR 59 | Ht <= 58 in | Wt 103.0 lb

## 2019-02-10 DIAGNOSIS — Z833 Family history of diabetes mellitus: Secondary | ICD-10-CM | POA: Diagnosis not present

## 2019-02-10 DIAGNOSIS — Z801 Family history of malignant neoplasm of trachea, bronchus and lung: Secondary | ICD-10-CM | POA: Diagnosis not present

## 2019-02-10 DIAGNOSIS — I4819 Other persistent atrial fibrillation: Secondary | ICD-10-CM | POA: Diagnosis not present

## 2019-02-10 DIAGNOSIS — Z87891 Personal history of nicotine dependence: Secondary | ICD-10-CM | POA: Insufficient documentation

## 2019-02-10 DIAGNOSIS — Z880 Allergy status to penicillin: Secondary | ICD-10-CM | POA: Insufficient documentation

## 2019-02-10 DIAGNOSIS — I5042 Chronic combined systolic (congestive) and diastolic (congestive) heart failure: Secondary | ICD-10-CM | POA: Insufficient documentation

## 2019-02-10 DIAGNOSIS — I2510 Atherosclerotic heart disease of native coronary artery without angina pectoris: Secondary | ICD-10-CM | POA: Insufficient documentation

## 2019-02-10 DIAGNOSIS — N189 Chronic kidney disease, unspecified: Secondary | ICD-10-CM | POA: Diagnosis not present

## 2019-02-10 DIAGNOSIS — I130 Hypertensive heart and chronic kidney disease with heart failure and stage 1 through stage 4 chronic kidney disease, or unspecified chronic kidney disease: Secondary | ICD-10-CM | POA: Insufficient documentation

## 2019-02-10 DIAGNOSIS — K5090 Crohn's disease, unspecified, without complications: Secondary | ICD-10-CM | POA: Insufficient documentation

## 2019-02-10 DIAGNOSIS — Z7901 Long term (current) use of anticoagulants: Secondary | ICD-10-CM | POA: Diagnosis not present

## 2019-02-10 DIAGNOSIS — Z79899 Other long term (current) drug therapy: Secondary | ICD-10-CM | POA: Insufficient documentation

## 2019-02-10 MED ORDER — AMIODARONE HCL 200 MG PO TABS: 200.0000 mg | ORAL_TABLET | Freq: Every day | ORAL | 1 refills | Status: AC

## 2019-02-10 NOTE — Progress Notes (Signed)
Primary Care Physician: Harlan Stains, MD Primary Cardiologist: Dr Angelena Form Referring Physician: Daune Perch NP   Melissa Mcdaniel is a 85 y.o. female with a history of persistent atrial fibrillation, HTN, CAD, chronic combined diastolic/systolic CHF, Crohn's diseaseandcolon cancer who presents for consultation in the [facility].  The patient was initially diagnosed with atrial fibrillation years ago and has done well on amiodarone. However, recently she has had elevated HR in the low 100's associated with fatigue and dyspnea. On follow up with Daune Perch, she was found to be in persistent atrial fibrillation.   Patient is s/p DCCV 02/04/19. Patient reports that since her cardioversion, she has had some increased energy and is able to make the bed without needed to rest. However, her SOB has remained the same. She had a chest CT yesterday and is waiting for the results. She is in SR today.  Today, she denies symptoms of chest pain, orthopnea, PND, lower extremity edema, dizziness, presyncope, syncope, snoring, daytime somnolence, bleeding, or neurologic sequela. The patient is tolerating medications without difficulties and is otherwise without complaint today.    Atrial Fibrillation Risk Factors:  she does not have symptoms or diagnosis of sleep apnea.  she has a BMI of Body mass index is 21.9 kg/m.Marland Kitchen Filed Weights   02/10/19 1403  Weight: 46.7 kg    Family History  Problem Relation Age of Onset  . Lung cancer Mother   . Alzheimer's disease Father   . Diabetes Maternal Grandmother      Atrial Fibrillation Management history:  Previous antiarrhythmic drugs: amiodarone Previous cardioversions: 2015, 02/04/19 Previous ablations: none CHADS2VASC score: 5 Anticoagulation history: Eliquis   Past Medical History:  Diagnosis Date  . A-fib ([place])   . Chronic kidney disease   . Congestive heart failure (CHF) ([place]) 05/2016  . Crohn's  disease ([place])   . Dysrhythmia    afib dx approx. 2016 per pt  . Hip fracture ([place])   . Hypertension    Past Surgical History:  Procedure Laterality Date  . CARDIOVERSION N/A 02/04/2019   Procedure: CARDIOVERSION;  Surgeon: Buford Dresser, MD;  [facility] ENDOSCOPY;  Service: Cardiovascular;  Laterality: N/A;  . FEMUR IM NAIL Right 07/26/2016   Procedure: INTRAMEDULLARY (IM) NAIL FEMORAL;  Surgeon: Renette Butters, MD;  Location: [facility];  Service: Orthopedics;  Laterality: Right;  . FRACTURE SURGERY    . large intestine - partial removal  approx 1993   due to crohns    Current Outpatient Medications  Medication Sig Dispense Refill  . acetaminophen (TYLENOL) 500 MG tablet Take 1,000 mg by mouth daily as needed for moderate pain or headache.    . alendronate (FOSAMAX) 70 MG tablet Take 70 mg by mouth every Saturday. Take with a full glass of water on an empty stomach.    Marland Kitchen amiodarone (PACERONE) 200 MG tablet Take 1 tablet (200 mg total) by mouth 2 (two) times daily. 180 tablet 1  . carvedilol (COREG) 12.5 MG tablet Take 12.5 mg by mouth 2 (two) times daily with a meal.    . Cholecalciferol (VITAMIN D3) 2000 units TABS Take 2,000 Units by mouth daily.     . clonazePAM (KLONOPIN) 0.5 MG tablet Take 0.5 mg by mouth 2 (two) times daily.    Marland Kitchen ELIQUIS 2.5 MG TABS tablet TAKE 1 TABLET TWICE DAILY 180 tablet 1  . Ferrous Sulfate (IRON) 28 MG TABS Take 28 mg by mouth daily.     . furosemide (LASIX)  20 MG tablet Take 20 mg by mouth daily as needed for edema.     . hydrALAZINE (APRESOLINE) 25 MG tablet Take 25 mg by mouth 2 (two) times daily.     Marland Kitchen latanoprost (XALATAN) 0.005 % ophthalmic solution Place 1 drop into both eyes at bedtime.    Marland Kitchen loperamide (IMODIUM) 2 MG capsule Take 4 mg by mouth 4 (four) times daily as needed for diarrhea or loose stools.    . mirtazapine (REMERON) 15 MG tablet Take 15 mg by mouth at bedtime.     . potassium chloride (K-DUR) 10 MEQ tablet Take 10 mEq by mouth  daily as needed (when taking lasix).     Marland Kitchen venlafaxine XR (EFFEXOR-XR) 150 MG 24 hr capsule Take 150 mg by mouth 2 (two) times daily.     No current facility-administered medications for this encounter.     Allergies  Allergen Reactions  . Penicillins Swelling    Did it involve swelling of the face/tongue/throat, SOB, or low BP? Yes Did it involve sudden or severe rash/hives, skin peeling, or any reaction on the inside of your mouth or nose? No Did you need to seek medical attention at a hospital or doctor's office? No When did it last happen?childhood allergy If all above answers are "NO", may proceed with cephalosporin use.     Social History   Socioeconomic History  . Marital status: Married    Spouse name: Not on file  . Number of children: 3  . Years of education: Not on file  . Highest education level: Not on file  Occupational History  . Occupation: Worked at [employer]  . Financial resource strain: Not on file  . Food insecurity    Worry: Not on file    Inability: Not on file  . Transportation needs    Medical: Not on file    Non-medical: Not on file  Tobacco Use  . Smoking status: Former Smoker    Packs/day: 0.50    Years: 30.00    Pack years: 15.00    Types: Cigarettes    Quit date: 09/15/1975    Years since quitting: 43.4  . Smokeless tobacco: Never Used  Substance and Sexual Activity  . Alcohol use: No  . Drug use: No  . Sexual activity: Not on file  Lifestyle  . Physical activity    Days per week: Not on file    Minutes per session: Not on file  . Stress: Not on file  Relationships  . Social Herbalist on phone: Not on file    Gets together: Not on file    Attends religious service: Not on file    Active member of club or organization: Not on file    Attends meetings of clubs or organizations: Not on file    Relationship status: Not on file  . Intimate partner violence    Fear of current or ex partner: Not on  file    Emotionally abused: Not on file    Physically abused: Not on file    Forced sexual activity: Not on file  Other Topics Concern  . Not on file  Social History Narrative  . Not on file     ROS- All systems are reviewed and negative except as per the HPI above.  Physical Exam: Vitals:   02/10/19 1403  BP: 136/60  Pulse: (!) 59  Weight: 46.7 kg  Height: 4' 9.5" (1.461 m)    GEN-  The patient is well appearing elderly female, alert and oriented x 3 today.   HEENT-head normocephalic, atraumatic, sclera clear, conjunctiva pink, hearing intact, trachea midline. Lungs- Clear to ausculation bilaterally, normal work of breathing Heart- Regular rate and rhythm, no murmurs, rubs or gallops  GI- soft, NT, ND, + BS Extremities- no clubbing, cyanosis, or edema MS- no significant deformity or atrophy Skin- no rash or lesion Psych- euthymic mood, full affect Neuro- strength and sensation are intact    Wt Readings from Last 3 Encounters:  02/10/19 46.7 kg  02/04/19 46.7 kg  01/15/19 46.7 kg    EKG today demonstrates SR HR 59, LVH, PR 158, QRS 94, QTc 455  Echo 03/31/2018 demonstrated  - Left ventricle: The cavity size was normal. There was mild   concentric hypertrophy. Systolic function was normal. The   estimated ejection fraction was in the range of 55% to 60%. Wall   motion was normal; there were no regional wall motion   abnormalities. Features are consistent with a pseudonormal left   ventricular filling pattern, with concomitant abnormal relaxation   and increased filling pressure (grade 2 diastolic dysfunction).   Doppler parameters are consistent with high ventricular filling   pressure. - Aortic valve: Transvalvular velocity was within the normal range.   There was no stenosis. There was mild regurgitation. Valve area   (VTI): 1.45 cm^2. Valve area (Vmax): 1.56 cm^2. Valve area   (Vmean): 1.53 cm^2. - Mitral valve: Transvalvular velocity was within the normal  range.   There was no evidence for stenosis. There was trivial   regurgitation. - Left atrium: The atrium was severely dilated. - Right ventricle: The cavity size was normal. Wall thickness was   normal. Systolic function was normal. - Atrial septum: No defect or patent foramen ovale was identified   by color flow Doppler. - Tricuspid valve: There was mild regurgitation. - Pulmonary arteries: Systolic pressure was within the normal   range. PA peak pressure: 35 mm Hg (S).  Epic records are reviewed at length today  Assessment and Plan:  1. Persistent atrial fibrillation S/p DCCV 02/04/19. She appears to be maintaining SR. Decrease amiodarone to 200 mg daily. Continue Coreg 12.5 mg BID.  Continue Eliquis 2.5 mg BID. Patient declined Cmet/TSH today. Would recommend recheck on follow up.  This patients CHA2DS2-VASc Score and unadjusted Ischemic Stroke Rate (% per year) is equal to 7.2 % stroke rate/year from a score of 5  Above score calculated as 1 point each if present [CHF, HTN, DM, Vascular=MI/PAD/Aortic Plaque, Age if 65-74, or Female] Above score calculated as 2 points each if present [Age > 75, or Stroke/TIA/TE]   2. Combined chronic systolic and diastolic CHF No symptoms of fluid overload. Continue present therapy.   3. HTN Stable, no changes today.  4. CAD No anginal symptoms. Continue present therapy.   Follow up with Dr Angelena Form as scheduled. AF clinic 6 months.   [facility] [address] [place], [place] [address] [phone] 02/10/2019 2:41 PM

## 2019-02-16 ENCOUNTER — Ambulatory Visit (INDEPENDENT_AMBULATORY_CARE_PROVIDER_SITE_OTHER): Payer: Medicare Other | Admitting: Pulmonary Disease

## 2019-02-16 ENCOUNTER — Other Ambulatory Visit: Payer: Self-pay

## 2019-02-16 ENCOUNTER — Encounter: Payer: Self-pay | Admitting: Pulmonary Disease

## 2019-02-16 VITALS — BP 110/48 | HR 67 | Temp 98.1°F | Ht 60.0 in | Wt 103.6 lb

## 2019-02-16 DIAGNOSIS — J432 Centrilobular emphysema: Secondary | ICD-10-CM

## 2019-02-16 DIAGNOSIS — J479 Bronchiectasis, uncomplicated: Secondary | ICD-10-CM | POA: Insufficient documentation

## 2019-02-16 DIAGNOSIS — R918 Other nonspecific abnormal finding of lung field: Secondary | ICD-10-CM | POA: Diagnosis not present

## 2019-02-16 DIAGNOSIS — R0602 Shortness of breath: Secondary | ICD-10-CM | POA: Diagnosis not present

## 2019-02-16 MED ORDER — BREO ELLIPTA 100-25 MCG/INH IN AEPB: 1.0000 | INHALATION_SPRAY | Freq: Every day | RESPIRATORY_TRACT | 0 refills | Status: DC

## 2019-02-16 MED ORDER — BREO ELLIPTA 100-25 MCG/INH IN AEPB: 1.0000 | INHALATION_SPRAY | Freq: Every day | RESPIRATORY_TRACT | 6 refills | Status: DC

## 2019-02-16 NOTE — Progress Notes (Signed)
Synopsis: Referred in 06/2018 for abnormal CXR  Subjective:   PATIENT ID: Melissa Mcdaniel GENDER: female DOB: 20-Nov-1932, MRN: [id number]   HPI  Chief Complaint  Patient presents with  . Follow-up    CT 02/09/19 Shortness of breath has improved    Ms. Melissa Mcdaniel is a 85 year old female former smoker (15 pack years) with history of chronic diastolic heart failure, atrial fibrillation, hypertension, CKD, depression and recent pneumonia who presents for follow-up.  Since our last visit, she has had close follow-up with her Cardiologist for her atrial fibrillation. On review of Cardiology clinic notes from 6/2- 7/21, she has had increased heart rate in the 110s with associated fatigue and decreased energy refractory to medical management. On 7/15 she was admitted to the hospital for an elective cardioversion to NSR. On her follow-up appointment last week, she has had improved energy.  For the last 6 months, she reports shortness of breath in the last year that she initially attributed to her heart issues but now she still notices it on moderate exertion. Able to walk short distances however >200 ft becomes difficult. Exacerbated by activity. Improves with rest. Associated with fatigue. Denies wheezing or cough.  Social History: Former smoker.  15 pack years.  Quit in 1977  Environmental exposures: None  I have personally reviewed patient's past medical/family/social history/allergies/current medications.  Past Medical History:  Diagnosis Date  . A-fib ([place])   . Chronic kidney disease   . Congestive heart failure (CHF) ([place]) 05/2016  . Crohn's disease ([place])   . Dysrhythmia    afib dx approx. 2016 per pt  . Hip fracture ([place])   . Hypertension      Family History  Problem Relation Age of Onset  . Lung cancer Mother   . Alzheimer's disease Father   . Diabetes Maternal Grandmother      Social History   Occupational History  . Occupation: Worked at [employer]  . Smoking status: Former Smoker    Packs/day: 0.50    Years: 30.00    Pack years: 15.00    Types: Cigarettes    Start date: 59    Quit date: 1980    Years since quitting: 40.5  . Smokeless tobacco: Never Used  Substance and Sexual Activity  . Alcohol use: No  . Drug use: No  . Sexual activity: Not on file    Allergies  Allergen Reactions  . Penicillins Swelling    Did it involve swelling of the face/tongue/throat, SOB, or low BP? Yes Did it involve sudden or severe rash/hives, skin peeling, or any reaction on the inside of your mouth or nose? No Did you need to seek medical attention at a hospital or doctor's office? No When did it last happen?childhood allergy If all above answers are "NO", may proceed with cephalosporin use.      Outpatient Medications Prior to Visit  Medication Sig Dispense Refill  . acetaminophen (TYLENOL) 500 MG tablet Take 1,000 mg by mouth daily as needed for moderate pain or headache.    . alendronate (FOSAMAX) 70 MG tablet Take 70 mg by mouth every Saturday. Take with a full glass of water on an empty stomach.    Marland Kitchen amiodarone (PACERONE) 200 MG tablet Take 1 tablet (200 mg total) by mouth daily. 180 tablet 1  . carvedilol (COREG) 12.5 MG tablet Take 12.5 mg by mouth 2 (two) times daily with a meal.    . Cholecalciferol (VITAMIN D3) 2000 units TABS Take  2,000 Units by mouth daily.     . clonazePAM (KLONOPIN) 0.5 MG tablet Take 0.5 mg by mouth 2 (two) times daily.    Marland Kitchen ELIQUIS 2.5 MG TABS tablet TAKE 1 TABLET TWICE DAILY 180 tablet 1  . Ferrous Sulfate (IRON) 28 MG TABS Take 28 mg by mouth daily.     . furosemide (LASIX) 20 MG tablet Take 20 mg by mouth daily as needed for edema.     . hydrALAZINE (APRESOLINE) 25 MG tablet Take 25 mg by mouth 2 (two) times daily.     Marland Kitchen latanoprost (XALATAN) 0.005 % ophthalmic solution Place 1 drop into both eyes at bedtime.    Marland Kitchen loperamide (IMODIUM) 2 MG capsule Take 4 mg by mouth 4 (four) times  daily as needed for diarrhea or loose stools.    . mirtazapine (REMERON) 15 MG tablet Take 15 mg by mouth at bedtime.     . potassium chloride (K-DUR) 10 MEQ tablet Take 10 mEq by mouth daily as needed (when taking lasix).     Marland Kitchen venlafaxine XR (EFFEXOR-XR) 150 MG 24 hr capsule Take 150 mg by mouth 2 (two) times daily.     No facility-administered medications prior to visit.     Review of Systems  Constitutional: Positive for malaise/fatigue. Negative for chills, diaphoresis, fever and weight loss.  HENT: Negative for congestion, ear pain and sore throat.   Respiratory: Positive for shortness of breath. Negative for cough, hemoptysis, sputum production and wheezing.   Cardiovascular: Negative for chest pain, palpitations and leg swelling.  Gastrointestinal: Negative for abdominal pain, heartburn and nausea.  Genitourinary: Negative for frequency.  Musculoskeletal: Negative for joint pain and myalgias.  Skin: Negative for itching and rash.  Neurological: Negative for dizziness, weakness and headaches.  Endo/Heme/Allergies: Does not bruise/bleed easily.  Psychiatric/Behavioral: Negative for depression. The patient is not nervous/anxious.      Objective:    Vitals:   02/16/19 1051 02/16/19 1052  BP:  (!) 110/48  Pulse:  67  Temp: 98.1 F (36.7 C)   TempSrc: Oral   SpO2:  90%  Weight: 103 lb 9.6 oz (47 kg)   Height: 5' (1.524 m)    SpO2: 90 % O2 Device: None (Room air)  Physical Exam: General: Well-appearing, no acute distress HENT: [place], AT Eyes: EOMI, no scleral icterus Respiratory: Clear to auscultation bilaterally.  No crackles, wheezing or rales Cardiovascular: RRR, -M/R/G, no JVD GI: BS+, soft, nontender Extremities:-Edema,-tenderness Neuro: AAO x4, CNII-XII grossly intact Skin: Intact, no rashes or bruising Psych: Normal mood, normal affect  Chest imaging: CXR imaging since 07/2016 demonstrates persistent RML opacity seen on recent imaging 05/21/18.  CT Chest  07/24/18 - RLL reticulonodular densities that suggest inflammatory changes that suggests bronchitis that likely represents scarring from prior infection CT Chest 02/09/19 - bilateral lower lobe reticular nodular densities with associated bronchiectasis and architectural distortion involving the RML and RLL>LLL  PFT: None on file  Labs: CBC    Component Value Date/Time   WBC 5.9 01/30/2019 1351   WBC 11.1 (H) 03/29/2018 0622   RBC 3.65 (L) 01/30/2019 1351   RBC 3.26 (L) 03/29/2018 1327   RBC 3.34 (L) 03/29/2018 0622   HGB 10.0 (L) 01/30/2019 1351   HCT 31.5 (L) 01/30/2019 1351   PLT 243 01/30/2019 1351   MCV 86 01/30/2019 1351   MCH 27.4 01/30/2019 1351   MCH 26.6 03/29/2018 0622   MCHC 31.7 01/30/2019 1351   MCHC 29.2 (L) 03/29/2018 0622   RDW  13.0 01/30/2019 1351   LYMPHSABS 0.8 01/30/2019 1351   MONOABS 0.6 03/29/2018 0622   EOSABS 0.1 01/30/2019 1351   BASOSABS 0.1 01/30/2019 1351   BMET    Component Value Date/Time   NA 137 01/30/2019 1351   K 5.6 (H) 01/30/2019 1351   CL 103 01/30/2019 1351   CO2 22 01/30/2019 1351   GLUCOSE 76 01/30/2019 1351   GLUCOSE 92 07/03/2018 1249   BUN 23 01/30/2019 1351   CREATININE 1.73 (H) 01/30/2019 1351   CALCIUM 8.8 01/30/2019 1351   GFRNONAA 27 (L) 01/30/2019 1351   GFRAA 31 (L) 01/30/2019 1351   Imaging, labs and test noted above have been reviewed independently by me.    Assessment & Plan:   Discussion: 85 year old female former smoker with hypertension, atrial fibrillation, chronic diastolic heart failure and CKD who presents for follow-up of CT chest.  Patient has known right lower lobe reticular nodular densities with associated bronchiectasis and architectural distortion and on review of CT imaging, findings are unchanged. Lung nodules also evaluated and are similar in size with slight improvement in RUL nodule.  Ambulatory O2 with no evidence of desaturation. Nadir O2 89%. No indication for supplemental O2.  #New onset  shortness of breath #Emphysema #Bronchiectasis Reviewed chest imaging with patient and addressed her questions. Abnormalities seen on your CXR and Chest CT are consistent with scarring from prior infection or chronic inflammation. Due to her new respiratory symptoms, would have low threshold to treat with bronchodilators if she becomes symptomatic --START Breo  #Pulmonary lung nodules - likely benign Similar 51m nodule in RUL and stable 6 LUL nodule --No further imaging indicated  Greater than 50% of this patient 25-minute office visit was spent face-to-face in counseling with the patient/family. We discussed medical diagnosis and treatment plan as noted.  Follow-up 3 months   Orders Placed This Encounter  Procedures  . Pulmonary function test    Standing Status:   Future    Standing Expiration Date:   02/16/2020    Order Specific Question:   Where should this test be performed?    Answer:    Pulmonary    Order Specific Question:   Full PFT: includes the following: basic spirometry, spirometry pre & post bronchodilator, diffusion capacity (DLCO), lung volumes    Answer:   Full PFT   Meds ordered this encounter  Medications  . fluticasone furoate-vilanterol (BREO ELLIPTA) 100-25 MCG/INH AEPB    Sig: Inhale 1 puff into the lungs daily.    Dispense:  1 each    Refill:  6  . fluticasone furoate-vilanterol (BREO ELLIPTA) 100-25 MCG/INH AEPB    Sig: Inhale 1 puff into the lungs daily.    Dispense:  1 each    Refill:  0    Order Specific Question:   Lot Number?    Answer:   [id number]    Order Specific Question:   Expiration Date?    Answer:   09/21/2019    Order Specific Question:   Manufacturer?    Answer:   [employer] [[id number]]    Order Specific Question:   Quantity    Answer:   2   Return in about 3 months (around 05/19/2019).  Jeffre Enriques JRodman Pickle MD [facility] 02/16/2019 12:29 PM  Personal pager: #[phone] unanswered, please page CCM On-call:  #[phone]

## 2019-02-16 NOTE — Patient Instructions (Addendum)
#  Abnormal CT: Emphysema, bronchiectasis, chronic reticular nodular densities Similar to your scan in January. However due to your shortness of breath, will recommend starting inhalers. --Take Breo 1 puff daily  #Pulmonary lung nodules - likely benign Similar 22m nodule in RUL and stable 6 LUL nodule --No further imaging indicated  Follow up in 3 months will PFT with Dr. ELoanne Drilling

## 2019-02-16 NOTE — Progress Notes (Signed)
Patient seen in the office today and instructed on use of breo inhaler.  Patient expressed understanding and demonstrated technique.

## 2019-02-24 ENCOUNTER — Telehealth: Payer: Self-pay | Admitting: Cardiology

## 2019-02-24 NOTE — Telephone Encounter (Signed)
  Husband is calling because he needs to come in with patient to her appt due to health issues. Her appt is 02/27/19 @ 3:45 pm

## 2019-02-25 ENCOUNTER — Telehealth: Payer: Self-pay | Admitting: Pulmonary Disease

## 2019-02-25 NOTE — Telephone Encounter (Signed)
Attempted to call pt but unable to reach. Left message for pt to return call. 

## 2019-02-25 NOTE — Telephone Encounter (Signed)
Spoke with pt. States that she thinks she is having a reaction to [place]. Reports that about an hour after taking Breo she becomes dizzy. States that the dizziness lasts all day and around dinner time it goes away. She also reports having some issues with her gait as well after she takes the medication. Stated, "I'm staggering all over the place." She isn't sure if this is coming from [place] or if it's something else.  Tonya - please advise. Thanks.

## 2019-02-25 NOTE — Telephone Encounter (Signed)
Patient can trial stopping inhaler to see if symptoms improve. Please schedule follow visit with APP early next week.

## 2019-02-26 NOTE — Telephone Encounter (Signed)
Attempted to call pt but unable to reach. Left message for pt to return call. 

## 2019-02-27 ENCOUNTER — Encounter: Payer: Self-pay | Admitting: Cardiology

## 2019-02-27 ENCOUNTER — Ambulatory Visit (INDEPENDENT_AMBULATORY_CARE_PROVIDER_SITE_OTHER): Payer: Medicare Other | Admitting: Cardiology

## 2019-02-27 ENCOUNTER — Other Ambulatory Visit: Payer: Self-pay

## 2019-02-27 VITALS — BP 178/64 | HR 69 | Ht 60.0 in | Wt 102.8 lb

## 2019-02-27 DIAGNOSIS — I5042 Chronic combined systolic (congestive) and diastolic (congestive) heart failure: Secondary | ICD-10-CM

## 2019-02-27 DIAGNOSIS — I10 Essential (primary) hypertension: Secondary | ICD-10-CM

## 2019-02-27 DIAGNOSIS — I4819 Other persistent atrial fibrillation: Secondary | ICD-10-CM | POA: Diagnosis not present

## 2019-02-27 DIAGNOSIS — I2510 Atherosclerotic heart disease of native coronary artery without angina pectoris: Secondary | ICD-10-CM | POA: Diagnosis not present

## 2019-02-27 NOTE — Telephone Encounter (Signed)
Patient is returning phone call.  Patient phone number is [phone] h.

## 2019-02-27 NOTE — Progress Notes (Signed)
Cardiology Office Note:    Date:  02/27/2019   ID:  Melissa Mcdaniel, DOB 05/18/1933, MRN [id number]  PCP:  Harlan Stains, MD  Cardiologist:  Lauree Chandler, MD  Referring MD: Harlan Stains, MD   Chief Complaint  Patient presents with  . Follow-up  . Atrial Fibrillation  . Congestive Heart Failure    History of Present Illness:    Melissa Mcdaniel is a 85 y.o. female with a past medical history significant for persistent atrial fibrillation, hypertension, CAD, chronic combined diastolic and systolic CHF, Crohn's disease and colon cancer.  The patient was initially diagnosed with atrial fibrillation years ago and had been doing well on amiodarone until May of this year when she was noted to have slightly increased heart rate.  I increased her carvedilol on 12/23/2018 and referred her to the A. fib clinic.  She ended up having a cardioversion on 02/04/2019 and at follow-up with the A. fib clinic on 02/10/2019 she reported increased energy.  She was maintaining sinus rhythm.  Ms. Raglin is here today with her husband for follow-up.  She says that her breathing is much better. She is now able to make a bed without having to sit down and rest.  She is now seen by pulmonology. In process of getting the right inhaler regimen. Breo was causing her nausea and lightheadedness and has now been stopped with improvement in symptoms.   Home BPs 110's-140's/50's-80. Yesterday 127/58.   Past Medical History:  Diagnosis Date  . A-fib ([place])   . Chronic kidney disease   . Congestive heart failure (CHF) ([place]) 05/2016  . Crohn's disease ([place])   . Dysrhythmia    afib dx approx. 2016 per pt  . Hip fracture ([place])   . Hypertension     Past Surgical History:  Procedure Laterality Date  . CARDIOVERSION N/A 02/04/2019   Procedure: CARDIOVERSION;  Surgeon: Buford Dresser, MD;  Location: [facility] ENDOSCOPY;  Service: Cardiovascular;  Laterality: N/A;  . FEMUR IM NAIL Right 07/26/2016   Procedure: INTRAMEDULLARY (IM) NAIL FEMORAL;  Surgeon: Renette Butters, MD;  Location: [place];  Service: Orthopedics;  Laterality: Right;  . FRACTURE SURGERY    . large intestine - partial removal  approx 1993   due to crohns    Current Medications: Current Meds  Medication Sig  . acetaminophen (TYLENOL) 500 MG tablet Take 1,000 mg by mouth daily as needed for moderate pain or headache.  . alendronate (FOSAMAX) 70 MG tablet Take 70 mg by mouth every Saturday. Take with a full glass of water on an empty stomach.  Marland Kitchen amiodarone (PACERONE) 200 MG tablet Take 1 tablet (200 mg total) by mouth daily.  . carvedilol (COREG) 12.5 MG tablet Take 12.5 mg by mouth 2 (two) times daily with a meal.  . Cholecalciferol (VITAMIN D3) 2000 units TABS Take 2,000 Units by mouth daily.   . clonazePAM (KLONOPIN) 0.5 MG tablet Take 0.5 mg by mouth 2 (two) times daily.  Marland Kitchen ELIQUIS 2.5 MG TABS tablet TAKE 1 TABLET TWICE DAILY  . Ferrous Sulfate (IRON) 28 MG TABS Take 28 mg by mouth daily.   . fluticasone furoate-vilanterol (BREO ELLIPTA) 100-25 MCG/INH AEPB Inhale 1 puff into the lungs daily.  . furosemide (LASIX) 20 MG tablet Take 20 mg by mouth daily as needed for edema.   . hydrALAZINE (APRESOLINE) 25 MG tablet Take 25 mg by mouth 2 (two) times daily.   Marland Kitchen latanoprost (XALATAN) 0.005 % ophthalmic solution Place 1 drop into both eyes  at bedtime.  Marland Kitchen loperamide (IMODIUM) 2 MG capsule Take 4 mg by mouth 4 (four) times daily as needed for diarrhea or loose stools.  . mirtazapine (REMERON) 15 MG tablet Take 15 mg by mouth at bedtime.   . potassium chloride (K-DUR) 10 MEQ tablet Take 10 mEq by mouth daily as needed (when taking lasix).   Marland Kitchen venlafaxine XR (EFFEXOR-XR) 150 MG 24 hr capsule Take 150 mg by mouth 2 (two) times daily.     Allergies:   Penicillins   Social History   Socioeconomic History  . Marital status: Married    Spouse name: Not on file  . Number of children: 3  . Years of education: Not on file  .  Highest education level: Not on file  Occupational History  . Occupation: Worked at [employer]  . Financial resource strain: Not on file  . Food insecurity    Worry: Not on file    Inability: Not on file  . Transportation needs    Medical: Not on file    Non-medical: Not on file  Tobacco Use  . Smoking status: Former Smoker    Packs/day: 0.50    Years: 30.00    Pack years: 15.00    Types: Cigarettes    Start date: 18    Quit date: 1980    Years since quitting: 40.6  . Smokeless tobacco: Never Used  Substance and Sexual Activity  . Alcohol use: No  . Drug use: No  . Sexual activity: Not on file  Lifestyle  . Physical activity    Days per week: Not on file    Minutes per session: Not on file  . Stress: Not on file  Relationships  . Social Herbalist on phone: Not on file    Gets together: Not on file    Attends religious service: Not on file    Active member of club or organization: Not on file    Attends meetings of clubs or organizations: Not on file    Relationship status: Not on file  Other Topics Concern  . Not on file  Social History Narrative  . Not on file     Family History: The patient's family history includes Alzheimer's disease in her father; Diabetes in her maternal grandmother; Lung cancer in her mother. ROS:   Please see the history of present illness.     All other systems reviewed and are negative.  EKGs/Labs/Other Studies Reviewed:    The following studies were reviewed today:  Echocardiogram 03/31/2018  Study Conclusions  - Left ventricle: The cavity size was normal. There was mild   concentric hypertrophy. Systolic function was normal. The   estimated ejection fraction was in the range of 55% to 60%. Wall   motion was normal; there were no regional wall motion   abnormalities. Features are consistent with a pseudonormal left   ventricular filling pattern, with concomitant abnormal relaxation   and  increased filling pressure (grade 2 diastolic dysfunction).   Doppler parameters are consistent with high ventricular filling   pressure. - Aortic valve: Transvalvular velocity was within the normal range.   There was no stenosis. There was mild regurgitation. Valve area   (VTI): 1.45 cm^2. Valve area (Vmax): 1.56 cm^2. Valve area   (Vmean): 1.53 cm^2. - Mitral valve: Transvalvular velocity was within the normal range.   There was no evidence for stenosis. There was trivial   regurgitation. - Left atrium: The atrium was  severely dilated. - Right ventricle: The cavity size was normal. Wall thickness was   normal. Systolic function was normal. - Atrial septum: No defect or patent foramen ovale was identified   by color flow Doppler. - Tricuspid valve: There was mild regurgitation. - Pulmonary arteries: Systolic pressure was within the normal   range. PA peak pressure: 35 mm Hg (S).  EKG:  EKG is not ordered today.   Recent Labs: 03/29/2018: B Natriuretic Peptide 367.9 01/30/2019: BUN 23; Creatinine, Ser 1.73; Hemoglobin 10.0; Platelets 243; Potassium 5.6; Sodium 137   Recent Lipid Panel No results found for: CHOL, TRIG, HDL, CHOLHDL, VLDL, LDLCALC, LDLDIRECT  Physical Exam:    VS:  BP (!) 178/64   Pulse 69   Ht 5' (1.524 m)   Wt 102 lb 12.8 oz (46.6 kg)   SpO2 99%   BMI 20.08 kg/m     Wt Readings from Last 3 Encounters:  02/27/19 102 lb 12.8 oz (46.6 kg)  02/16/19 103 lb 9.6 oz (47 kg)  02/10/19 103 lb (46.7 kg)     Physical Exam  Constitutional: She is oriented to person, place, and time.  Thin, frail elderly female  HENT:  Head: Normocephalic and atraumatic.  Neck: Normal range of motion. Neck supple. No JVD present.  Cardiovascular: Normal rate, regular rhythm and intact distal pulses. Exam reveals no gallop and no friction rub.  No murmur heard. Pulmonary/Chest: Effort normal and breath sounds normal. No respiratory distress. She has no wheezes. She has no rales.   Breath sounds mildly diminished throughout  Abdominal: Soft. Bowel sounds are normal.  Musculoskeletal: Normal range of motion.        General: No edema.  Neurological: She is alert and oriented to person, place, and time.  Skin: Skin is warm and dry.  Psychiatric: She has a normal mood and affect. Her behavior is normal. Judgment and thought content normal.  Vitals reviewed.    ASSESSMENT:    1. Persistent atrial fibrillation   2. Chronic combined systolic and diastolic heart failure ([place])   3. Essential (primary) hypertension   4. Coronary artery disease involving native coronary artery of native heart without angina pectoris    PLAN:    In order of problems listed above:  Persistent atrial fibrillation -Status post DCCV 02/04/2019. -She is maintaining sinus rhythm on amiodarone 200 mg daily and carvedilol 12.5 mg twice daily. -She is anticoagulated with Eliquis 2.5 mg twice daily (reduced dose for age and weight).   Combined chronic systolic and diastolic heart failure -Currently no symptoms of fluid overload.  Continue current therapy.  Hypertension -Blood pressure is elevated today in the office but patient and her husband report that she is well controlled at home and was well controlled at last visit.  I advised them to continue to monitor her blood pressure and report to [place] if systolic blood pressure consistently greater than 140.  They verbalized understanding.  CAD -No anginal symptoms.   Medication Adjustments/Labs and Tests Ordered: Current medicines are reviewed at length with the patient today.  Concerns regarding medicines are outlined above. Labs and tests ordered and medication changes are outlined in the patient instructions below:  Patient Instructions  Medication Instructions:  Your physician recommends that you continue on your current medications as directed. Please refer to the Current Medication list given to you today.  If you need a refill on your  cardiac medications before your next appointment, please call your pharmacy.   Lab work: None  If you have labs (blood work) drawn today and your tests are completely normal, you will receive your results only by: Marland Kitchen MyChart Message (if you have MyChart) OR . A paper copy in the mail If you have any lab test that is abnormal or we need to change your treatment, we will call you to review the results.  Testing/Procedures: None   Follow-Up: You are scheduled to see Dr. Angelena Form on 06/26/2019 @ 10:00 AM  Any Other Special Instructions Will Be Listed Below (If Applicable).  Please monitor your blood pressure at home. Let us know if the top number of your blood pressure is consistently running above 140    Signed, Daune Perch, NP  02/27/2019 [place]

## 2019-02-27 NOTE — Telephone Encounter (Signed)
Spoke with pt. She is aware of Tonya's response. Pt has been scheduled to see [name] on 03/06/2019 at 1100. Nothing further was needed at this time.

## 2019-02-27 NOTE — Telephone Encounter (Signed)
Attempted to call pt but unable to reach. Left message for pt to return call. 

## 2019-02-27 NOTE — Patient Instructions (Addendum)
Medication Instructions:  Your physician recommends that you continue on your current medications as directed. Please refer to the Current Medication list given to you today.  If you need a refill on your cardiac medications before your next appointment, please call your pharmacy.   Lab work: None   If you have labs (blood work) drawn today and your tests are completely normal, you will receive your results only by: Marland Kitchen MyChart Message (if you have MyChart) OR . A paper copy in the mail If you have any lab test that is abnormal or we need to change your treatment, we will call you to review the results.  Testing/Procedures: None   Follow-Up: You are scheduled to see Dr. Angelena Form on 06/26/2019 @ 10:00 AM  Any Other Special Instructions Will Be Listed Below (If Applicable).  Please monitor your blood pressure at home. Let us know if the top number of your blood pressure is consistently running above 140

## 2019-03-06 ENCOUNTER — Telehealth: Payer: Self-pay | Admitting: Pulmonary Disease

## 2019-03-06 ENCOUNTER — Encounter: Payer: Self-pay | Admitting: Pulmonary Disease

## 2019-03-06 ENCOUNTER — Ambulatory Visit (INDEPENDENT_AMBULATORY_CARE_PROVIDER_SITE_OTHER): Payer: Medicare Other | Admitting: Pulmonary Disease

## 2019-03-06 ENCOUNTER — Other Ambulatory Visit: Payer: Self-pay

## 2019-03-06 VITALS — BP 120/62 | HR 62 | Temp 98.0°F | Ht 60.0 in | Wt 101.4 lb

## 2019-03-06 DIAGNOSIS — Z79899 Other long term (current) drug therapy: Secondary | ICD-10-CM

## 2019-03-06 DIAGNOSIS — J432 Centrilobular emphysema: Secondary | ICD-10-CM | POA: Diagnosis not present

## 2019-03-06 MED ORDER — ANORO ELLIPTA 62.5-25 MCG/INH IN AEPB: 1.0000 | INHALATION_SPRAY | Freq: Every day | RESPIRATORY_TRACT | 0 refills | Status: DC

## 2019-03-06 NOTE — Progress Notes (Signed)
@Patient  ID: Melissa Mcdaniel, female    DOB: [birth year], 85 y.o.   MRN: [id number]  Chief Complaint  Patient presents with   Follow-up    breathing worse since stopping breo, ?allergic reaction to breo    Referring provider: Harlan Stains, MD  HPI:  85 year old female former smoker followed in our office for emphysema  PMH:  chronic diastolic heart failure, atrial fibrillation, hypertension, CKD, depression and recent pneumonia who presents for follow-up. Smoker/ Smoking History: Former smoker.  15-pack-year smoking history. Maintenance:  None  Pt of: Dr. Loanne Drilling  03/06/2019  - Visit   85 year old female former smoker followed in our office for emphysema.  Patient was last seen by Dr. Loanne Drilling and started on [facility].  Patient reports that she felt that the Brio Ellipta did help her with her breathing but then she started to have symptoms of dizziness.  This caused her to stop using her Adair Patter under our suggestions when she contacted our office.  Patient reports that the dizziness did go away but she feels that the shortness of breath and labored breathing has worsened since being off the inhaler.  She does not have a rescue inhaler at home.  She does not have pulmonary function testing on file.   Tests:   07/24/2018-CT chest without contrast- Increased reticular nodular densities are noted in the right lower lobe most consistent with atypical inflammation or infection some bronchitis and mucous plugging may be present as well, stable reticular densities are noted in the right middle lobe and left lower lobe which may represent scarring or sequela from previous atypical inflammation, 6 mm nodules noted in the right upper lobe which is significantly enlarged compared to prior exam, also noted a stable 6 mm nodule in left upper lobe, noncontrast chest CT at 3 to 6 months is recommended  02/09/2019-CT chest without contrast- no acute cardiopulmonary disease, chronic bilateral  patchy reticular nodular pattern of opacification with areas of discrete nodularity and mild bronchiectatic change over the right lower lobe, findings not significantly changed from 02/05/2018, slightly improved overall compared to 07/24/2018, findings likely due to chronic inflammatory or atypical infectious process,  03/31/2018-echocardiogram- LV ejection fraction 55 to 60%, PA pressure 35  SIX MIN WALK 03/06/2019 02/16/2019  Supplimental Oxygen during Test? (L/min) No No  Tech Comments: Patient was able to complete walk. She walked at a slow pace due to having hip pain. Declined having any chest pain. No O2 was not needed during or after walk. patient stopped during lap 2 to catch breath. sats dropped to 90 but came back up with some breathing. Patient's legs were hurting only completed 2 laps. patient was slightly dizzy and short of breath toward end of lap 2. patient walked and a moderate pace    FENO:  No results found for: NITRICOXIDE  PFT: No flowsheet data found.  Imaging: Ct Chest Wo Contrast  Result Date: 02/09/2019 CLINICAL DATA:  Six-month follow-up for multiple lung nodules on previous CT. EXAM: CT CHEST WITHOUT CONTRAST TECHNIQUE: Multidetector CT imaging of the chest was performed following the standard protocol without IV contrast. COMPARISON:  07/24/2018, 02/05/2018 and 07/05/2017 FINDINGS: Cardiovascular: Heart is normal size. Tiny amount of pericardial fluid is present. There is calcified plaque over the 3 vessel coronary arteries. Calcified plaque over the thoracic aorta. Remaining vascular structures are unremarkable. Mediastinum/Nodes: There are a few shotty mediastinal lymph nodes. No evidence of mediastinal or hilar adenopathy. Remaining mediastinal structures are unremarkable. Lungs/Pleura: Lungs are normally inflated demonstrate  centrilobular emphysematous disease. Examination demonstrates continued evidence of reticulonodular opacification bilaterally most notable over the  right middle lobe and lower lobes right worse than left. There are scattered years of discrete nodules. Findings are slightly worse overall compared to 2018 although not significantly changed from 02/05/2018 and demonstrates solid overall improvement compared to 07/24/2018. Subpleural opacification over the medial right lower lobe unchanged with associated bronchiectasis. No effusion. Central airways are unremarkable. Upper Abdomen: Moderate calcified plaque over the abdominal aorta. No acute findings. Atrophic left kidney. Musculoskeletal: Degenerative changes of the spine with multiple stable spinal compression fractures. IMPRESSION: 1.  No acute cardiopulmonary disease. 2. Chronic bilateral patchy reticulonodular pattern of opacification with areas of discrete nodularity and mild bronchiectatic change over the right lower lobe. Findings not significantly changed from 02/05/2018 although slightly improved overall compared to 07/24/2018. Findings likely due to a chronic inflammatory or atypical infectious process. 3. Aortic Atherosclerosis (ICD10-I70.0) and Emphysema (ICD10-J43.9). Atherosclerotic coronary artery disease. 4.  Atrophic left kidney. 5.  Multiple stable spinal compression fractures. Electronically Signed   By: Marin Olp M.D.   On: 02/09/2019 17:44      Specialty Problems      Pulmonary Problems   Bronchiectasis without complication (HCC)   Centrilobular emphysema (HCC)   Lung nodule, multiple      Allergies  Allergen Reactions   Penicillins Swelling    Did it involve swelling of the face/tongue/throat, SOB, or low BP? Yes Did it involve sudden or severe rash/hives, skin peeling, or any reaction on the inside of your mouth or nose? No Did you need to seek medical attention at a hospital or doctor's office? No When did it last happen?childhood allergy If all above answers are NO, may proceed with cephalosporin use.      There is no immunization history on file for  this patient.  Doesn't take flu vaccines   Past Medical History:  Diagnosis Date   A-fib [facility])    Chronic kidney disease    Congestive heart failure (CHF) ([place]) 05/2016   Crohn's disease ([place])    Dysrhythmia    afib dx approx. 2016 per pt   Hip fracture ([place])    Hypertension     Tobacco History: Social History   Tobacco Use  Smoking Status Former Smoker   Packs/day: 0.50   Years: 30.00   Pack years: 15.00   Types: Cigarettes   Start date: 31   Quit date: 1980   Years since quitting: 40.6  Smokeless Tobacco Never Used   Counseling given: Yes   Continue to not smoke  Outpatient Encounter Medications as of 03/06/2019  Medication Sig   acetaminophen (TYLENOL) 500 MG tablet Take 1,000 mg by mouth daily as needed for moderate pain or headache.   alendronate (FOSAMAX) 70 MG tablet Take 70 mg by mouth every Saturday. Take with a full glass of water on an empty stomach.   amiodarone (PACERONE) 200 MG tablet Take 1 tablet (200 mg total) by mouth daily.   carvedilol (COREG) 12.5 MG tablet Take 12.5 mg by mouth 2 (two) times daily with a meal.   Cholecalciferol (VITAMIN D3) 2000 units TABS Take 2,000 Units by mouth daily.    clonazePAM (KLONOPIN) 0.5 MG tablet Take 0.5 mg by mouth 2 (two) times daily.   ELIQUIS 2.5 MG TABS tablet TAKE 1 TABLET TWICE DAILY   Ferrous Sulfate (IRON) 28 MG TABS Take 28 mg by mouth daily.    furosemide (LASIX) 20 MG tablet Take 20 mg by  mouth daily as needed for edema.    hydrALAZINE (APRESOLINE) 25 MG tablet Take 25 mg by mouth 2 (two) times daily.    latanoprost (XALATAN) 0.005 % ophthalmic solution Place 1 drop into both eyes at bedtime.   loperamide (IMODIUM) 2 MG capsule Take 4 mg by mouth 4 (four) times daily as needed for diarrhea or loose stools.   mirtazapine (REMERON) 15 MG tablet Take 15 mg by mouth at bedtime.    potassium chloride (K-DUR) 10 MEQ tablet Take 10 mEq by mouth daily as needed (when taking lasix).     venlafaxine XR (EFFEXOR-XR) 150 MG 24 hr capsule Take 150 mg by mouth 2 (two) times daily.   umeclidinium-vilanterol (ANORO ELLIPTA) 62.5-25 MCG/INH AEPB Inhale 1 puff into the lungs daily.   [DISCONTINUED] fluticasone furoate-vilanterol (BREO ELLIPTA) 100-25 MCG/INH AEPB Inhale 1 puff into the lungs daily.   No facility-administered encounter medications on file as of 03/06/2019.      Review of Systems  Review of Systems  Constitutional: Negative for activity change, fatigue and fever.  HENT: Negative for sinus pressure, sinus pain and sore throat.   Respiratory: Positive for shortness of breath (Labored breathing). Negative for cough and wheezing.   Cardiovascular: Negative for chest pain and palpitations.  Gastrointestinal: Negative for diarrhea, nausea and vomiting.  Musculoskeletal: Negative for arthralgias.  Neurological: Negative for dizziness.  Psychiatric/Behavioral: Negative for sleep disturbance. The patient is not nervous/anxious.      Physical Exam  BP 120/62 (BP Location: Left Arm, Patient Position: Sitting, Cuff Size: Normal)    Pulse 62    Temp 98 F (36.7 C) (Oral)    Ht 5' (1.524 m)    Wt 101 lb 6.4 oz (46 kg)    SpO2 98%    BMI 19.80 kg/m   Wt Readings from Last 5 Encounters:  03/06/19 101 lb 6.4 oz (46 kg)  02/27/19 102 lb 12.8 oz (46.6 kg)  02/16/19 103 lb 9.6 oz (47 kg)  02/10/19 103 lb (46.7 kg)  02/04/19 103 lb (46.7 kg)     Physical Exam Vitals signs and nursing note reviewed.  Constitutional:      General: She is not in acute distress.    Appearance: Normal appearance. She is normal weight.  HENT:     Head: Normocephalic and atraumatic.     Right Ear: Tympanic membrane, ear canal and external ear normal. There is no impacted cerumen.     Left Ear: Tympanic membrane, ear canal and external ear normal. There is no impacted cerumen.     Nose: Nose normal. No congestion.     Mouth/Throat:     Mouth: Mucous membranes are moist.     Dentition:  Abnormal dentition. Has dentures.     Pharynx: Oropharynx is clear.  Eyes:     Pupils: Pupils are equal, round, and reactive to light.  Neck:     Musculoskeletal: Normal range of motion.  Cardiovascular:     Rate and Rhythm: Normal rate and regular rhythm.     Pulses: Normal pulses.     Heart sounds: Normal heart sounds. No murmur.  Pulmonary:     Effort: Pulmonary effort is normal. No respiratory distress.     Breath sounds: Normal breath sounds. No decreased air movement. No decreased breath sounds, wheezing or rales.     Comments: Slight barrel chest Skin:    General: Skin is warm and dry.     Capillary Refill: Capillary refill takes less than 2 seconds.  Neurological:     General: No focal deficit present.     Mental Status: She is alert and oriented to person, place, and time. Mental status is at baseline.     Gait: Gait normal.  Psychiatric:        Mood and Affect: Mood normal.        Behavior: Behavior normal.        Thought Content: Thought content normal.        Judgment: Judgment normal.      Lab Results:  CBC    Component Value Date/Time   WBC 5.9 01/30/2019 1351   WBC 11.1 (H) 03/29/2018 0622   RBC 3.65 (L) 01/30/2019 1351   RBC 3.26 (L) 03/29/2018 1327   RBC 3.34 (L) 03/29/2018 0622   HGB 10.0 (L) 01/30/2019 1351   HCT 31.5 (L) 01/30/2019 1351   PLT 243 01/30/2019 1351   MCV 86 01/30/2019 1351   MCH 27.4 01/30/2019 1351   MCH 26.6 03/29/2018 0622   MCHC 31.7 01/30/2019 1351   MCHC 29.2 (L) 03/29/2018 0622   RDW 13.0 01/30/2019 1351   LYMPHSABS 0.8 01/30/2019 1351   MONOABS 0.6 03/29/2018 0622   EOSABS 0.1 01/30/2019 1351   BASOSABS 0.1 01/30/2019 1351    BMET    Component Value Date/Time   NA 137 01/30/2019 1351   K 5.6 (H) 01/30/2019 1351   CL 103 01/30/2019 1351   CO2 22 01/30/2019 1351   GLUCOSE 76 01/30/2019 1351   GLUCOSE 92 07/03/2018 1249   BUN 23 01/30/2019 1351   CREATININE 1.73 (H) 01/30/2019 1351   CALCIUM 8.8 01/30/2019 1351     GFRNONAA 27 (L) 01/30/2019 1351   GFRAA 31 (L) 01/30/2019 1351    BNP    Component Value Date/Time   BNP 367.9 (H) 03/29/2018 0627    ProBNP    Component Value Date/Time   PROBNP 3,103 (H) 08/20/2017 1119      Assessment & Plan:   Centrilobular emphysema ([place]) Plan: Walk in office today Trial of Anoro Ellipta Need to get patient scheduled for pulmonary function testing Clinic pharmacy team work with patient today on inhaler Patient likely will need [employer] for you paperwork for patient assistance because if she tolerates Anoro Ellipta  Medication management Plan: Worked with clinic pharmacy team today on Ellipta device use Trial of Anoro Ellipta Patient to contact our office in 1 week and let us know how she is doing on this Likely will need [employer] for you paperwork to help with cost of Anoro    Return in about 2 months (around 05/06/2019), or if symptoms worsen or fail to improve, for Follow up with Dr. Loanne Drilling, Follow up for PFT.   Lauraine Rinne, NP 03/06/2019   This appointment was 48 minutes long with over 50% of the time in direct face-to-face patient care, assessment, plan of care, and follow-up. This time also includes time with clinical pharmacy team.

## 2019-03-06 NOTE — Progress Notes (Signed)
Pharmacy Note  Subjective:  Patient presents today to [facility] for follow-up for COPD to see Dr. Estanislado Pandy.   Patient seen by the pharmacist for counseling on Anoro Ellipta inhaler training.   Objective: Current Outpatient Medications on File Prior to Visit  Medication Sig Dispense Refill  . acetaminophen (TYLENOL) 500 MG tablet Take 1,000 mg by mouth daily as needed for moderate pain or headache.    . alendronate (FOSAMAX) 70 MG tablet Take 70 mg by mouth every Saturday. Take with a full glass of water on an empty stomach.    Marland Kitchen amiodarone (PACERONE) 200 MG tablet Take 1 tablet (200 mg total) by mouth daily. 180 tablet 1  . carvedilol (COREG) 12.5 MG tablet Take 12.5 mg by mouth 2 (two) times daily with a meal.    . Cholecalciferol (VITAMIN D3) 2000 units TABS Take 2,000 Units by mouth daily.     . clonazePAM (KLONOPIN) 0.5 MG tablet Take 0.5 mg by mouth 2 (two) times daily.    Marland Kitchen ELIQUIS 2.5 MG TABS tablet TAKE 1 TABLET TWICE DAILY 180 tablet 1  . Ferrous Sulfate (IRON) 28 MG TABS Take 28 mg by mouth daily.     . furosemide (LASIX) 20 MG tablet Take 20 mg by mouth daily as needed for edema.     . hydrALAZINE (APRESOLINE) 25 MG tablet Take 25 mg by mouth 2 (two) times daily.     Marland Kitchen latanoprost (XALATAN) 0.005 % ophthalmic solution Place 1 drop into both eyes at bedtime.    Marland Kitchen loperamide (IMODIUM) 2 MG capsule Take 4 mg by mouth 4 (four) times daily as needed for diarrhea or loose stools.    . mirtazapine (REMERON) 15 MG tablet Take 15 mg by mouth at bedtime.     . potassium chloride (K-DUR) 10 MEQ tablet Take 10 mEq by mouth daily as needed (when taking lasix).     Marland Kitchen venlafaxine XR (EFFEXOR-XR) 150 MG 24 hr capsule Take 150 mg by mouth 2 (two) times daily.     No current facility-administered medications on file prior to visit.      Assessment/Plan:  Patient was counseled on the purpose, proper use, and adverse effects of Anoro Ellipta inhaler.  Patient verbalized  understanding.  Reviewed appropriate use of maintenance vs rescue inhalers.  Stressed importance of using maintenance inhaler daily and rescue inhaler only as needed.  Patient verbalized understanding.  She was previously on [employer] and is familiar with Ellipta device. Demonstrated proper inhaler technique using Anoro Ellipta demo inhaler.  Patient able to demonstrate proper inhaler technique using teach back method. Patient was given sample in office today.    Instructed patient to inhale 1 puff once daily.  All questions encouraged and answered.  Instructed patient to call with any other questions or concerns.  Mariella Saa, PharmD, [place], [place] Clinical Specialty Pharmacist [phone]  03/06/2019 12:00 PM

## 2019-03-06 NOTE — Patient Instructions (Signed)
You were seen today by Lauraine Rinne, NP  for:   1. Centrilobular emphysema ([place])  Walk today in office  Trial of Anoro Ellipta  >>> Take 1 puff daily in the morning right when you wake up >>>Rinse your mouth out after use >>>This is a daily maintenance inhaler, NOT a rescue inhaler >>>Contact our office if you are having difficulties affording or obtaining this medication >>>It is important for you to be able to take this daily and not miss any doses   Worked with clinic pharmacy team on inhaler use today, also discussed patient assistance programs  We will get you scheduled for a pulmonary function test which she can complete prior to next office visit with Dr. Loanne Drilling  Follow Up:    Return in about 2 months (around 05/06/2019), or if symptoms worsen or fail to improve, for Follow up with Dr. Loanne Drilling, Follow up for PFT.   Please do your part to reduce the spread of COVID-19:      Reduce your risk of any infection  and COVID19 by using the similar precautions used for avoiding the common cold or flu:  Marland Kitchen Wash your hands often with soap and warm water for at least 20 seconds.  If soap and water are not readily available, use an alcohol-based hand sanitizer with at least 60% alcohol.  . If coughing or sneezing, cover your mouth and nose by coughing or sneezing into the elbow areas of your shirt or coat, into a tissue or into your sleeve (not your hands). Langley Gauss A MASK when in public  . Avoid shaking hands with others and consider head nods or verbal greetings only. . Avoid touching your eyes, nose, or mouth with unwashed hands.  . Avoid close contact with people who are sick. . Avoid places or events with large numbers of people in one location, like concerts or sporting events. . If you have some symptoms but not all symptoms, continue to monitor at home and seek medical attention if your symptoms worsen. . If you are having a medical emergency, call 911.   [place] / e-Visit: [email or web address]         [facility]: [facility]: [phone]                   [facility]: [phone]     It is flu season:   >>> Best ways to protect herself from the flu: Receive the yearly flu vaccine, practice good hand hygiene washing with soap and also using hand sanitizer when available, eat a nutritious meals, get adequate rest, hydrate appropriately   Please contact the office if your symptoms worsen or you have concerns that you are not improving.   Thank you for choosing [facility] for your healthcare, and for allowing [place] to partner with you on your healthcare journey. I am thankful to be able to provide care to you today.   Wyn Quaker FNP-C

## 2019-03-06 NOTE — Assessment & Plan Note (Signed)
Plan: Walk in office today Trial of Anoro Ellipta Need to get patient scheduled for pulmonary function testing Clinic pharmacy team work with patient today on inhaler Patient likely will need [employer] for you paperwork for patient assistance because if she tolerates Anoro Ellipta

## 2019-03-06 NOTE — Assessment & Plan Note (Signed)
Plan: Worked with clinic pharmacy team today on Ellipta device use Trial of Anoro Ellipta Patient to contact our office in 1 week and let us know how she is doing on this Likely will need [employer] for you paperwork to help with cost of Anoro

## 2019-03-06 NOTE — Telephone Encounter (Signed)
Patient was seen by [name] today. He would like for patient to return in 2 months (around 05/06/2019)  to see Dr. Loanne Drilling with a full PFT.    Thanks!

## 2019-03-13 ENCOUNTER — Telehealth: Payer: Self-pay | Admitting: Pulmonary Disease

## 2019-03-13 MED ORDER — ANORO ELLIPTA 62.5-25 MCG/INH IN AEPB: 1.0000 | INHALATION_SPRAY | Freq: Every day | RESPIRATORY_TRACT | 3 refills | Status: DC

## 2019-03-13 NOTE — Telephone Encounter (Signed)
Call returned to patient, confirmed medication and pharmacy. Medication sent in. Voiced understanding. Nothing further needed at this time.

## 2019-03-18 DIAGNOSIS — Z961 Presence of intraocular lens: Secondary | ICD-10-CM | POA: Diagnosis not present

## 2019-03-18 DIAGNOSIS — H401111 Primary open-angle glaucoma, right eye, mild stage: Secondary | ICD-10-CM | POA: Diagnosis not present

## 2019-03-18 DIAGNOSIS — H401122 Primary open-angle glaucoma, left eye, moderate stage: Secondary | ICD-10-CM | POA: Diagnosis not present

## 2019-03-27 NOTE — Telephone Encounter (Signed)
pft scheduled on 05/11/2019-pr

## 2019-04-01 DIAGNOSIS — M810 Age-related osteoporosis without current pathological fracture: Secondary | ICD-10-CM | POA: Diagnosis not present

## 2019-04-01 DIAGNOSIS — I129 Hypertensive chronic kidney disease with stage 1 through stage 4 chronic kidney disease, or unspecified chronic kidney disease: Secondary | ICD-10-CM | POA: Diagnosis not present

## 2019-04-01 DIAGNOSIS — I480 Paroxysmal atrial fibrillation: Secondary | ICD-10-CM | POA: Diagnosis not present

## 2019-04-01 DIAGNOSIS — F339 Major depressive disorder, recurrent, unspecified: Secondary | ICD-10-CM | POA: Diagnosis not present

## 2019-04-01 DIAGNOSIS — N183 Chronic kidney disease, stage 3 (moderate): Secondary | ICD-10-CM | POA: Diagnosis not present

## 2019-04-01 DIAGNOSIS — D638 Anemia in other chronic diseases classified elsewhere: Secondary | ICD-10-CM | POA: Diagnosis not present

## 2019-04-01 DIAGNOSIS — I5023 Acute on chronic systolic (congestive) heart failure: Secondary | ICD-10-CM | POA: Diagnosis not present

## 2019-04-15 ENCOUNTER — Ambulatory Visit (INDEPENDENT_AMBULATORY_CARE_PROVIDER_SITE_OTHER): Payer: Medicare Other | Admitting: Primary Care

## 2019-04-15 ENCOUNTER — Telehealth: Payer: Self-pay | Admitting: Pulmonary Disease

## 2019-04-15 ENCOUNTER — Encounter: Payer: Self-pay | Admitting: Primary Care

## 2019-04-15 ENCOUNTER — Other Ambulatory Visit: Payer: Self-pay | Admitting: Primary Care

## 2019-04-15 ENCOUNTER — Other Ambulatory Visit: Payer: Self-pay

## 2019-04-15 VITALS — BP 120/70 | HR 71 | Temp 97.6°F | Ht 59.0 in | Wt 103.8 lb

## 2019-04-15 DIAGNOSIS — R0602 Shortness of breath: Secondary | ICD-10-CM

## 2019-04-15 DIAGNOSIS — J479 Bronchiectasis, uncomplicated: Secondary | ICD-10-CM | POA: Diagnosis not present

## 2019-04-15 DIAGNOSIS — J432 Centrilobular emphysema: Secondary | ICD-10-CM | POA: Diagnosis not present

## 2019-04-15 DIAGNOSIS — I5042 Chronic combined systolic (congestive) and diastolic (congestive) heart failure: Secondary | ICD-10-CM

## 2019-04-15 MED ORDER — ALBUTEROL SULFATE HFA 108 (90 BASE) MCG/ACT IN AERS: 2.0000 | INHALATION_SPRAY | Freq: Four times a day (QID) | RESPIRATORY_TRACT | 1 refills | Status: DC | PRN

## 2019-04-15 NOTE — Progress Notes (Signed)
@Patient  ID: Melissa Mcdaniel, female    DOB: 1932-10-11, 85 y.o.   MRN: [id number]  Chief Complaint  Patient presents with  . Follow-up    Patient reports that she still has sob with exertion. She denies any cough.     Referring provider: Harlan Stains, MD  HPI: 85 year old female, former smoker quit 1980 (15-pack-year history).  Past medical history significant for bronchiectasis, centrilobular emphysema, hypertension, A. fib, chronic combined systolic and diastolic heart failure, chronic kidney disease.  Patient of Dr. Loanne Drilling, last seen by pulmonary nurse practitioner on 03/06/2019. No documented PFTs Eos 200 in 2019. Echocardiogram in 2019 showed EF 76-81%, grade 2 diastolic dysfunction.   04/15/2019 Patient presents today to discuss medications. Reports that Anoro has not been working well for her recently. States that her shortness of breath has been a lot worse this past week. Notices dyspnea symptoms mostly on exertion such as when she is making her bed or going up the stairs. She does not have a cough or associated wheezing. She thinks her symptoms may be related to anxiety, her husband was tested for COVID and they did not find out he was negative til this morning. She had a cardiac ablation in July, has an apt with cardiology on December 4th. Takes lasix as needed only, last took 1 month ago. No increase in weight or new edema.   Imaging: 02/09/19 HRCT - Lungs are normally inflated demonstrate centrilobular emphysematous disease. Examination demonstrates continued evidence of reticulonodular opacification bilaterally. Subpleural opacification over the medial right lower lobe unchanged with associated bronchiectasis. Findings likely due to a chronic inflammatory or atypical infectious process.  Allergies  Allergen Reactions  . Penicillins Swelling    Did it involve swelling of the face/tongue/throat, SOB, or low BP? Yes Did it involve sudden or severe rash/hives, skin peeling,  or any reaction on the inside of your mouth or nose? No Did you need to seek medical attention at a hospital or doctor's office? No When did it last happen?childhood allergy If all above answers are "NO", may proceed with cephalosporin use.      There is no immunization history on file for this patient.  Past Medical History:  Diagnosis Date  . A-fib ([place])   . Chronic kidney disease   . Congestive heart failure (CHF) ([place]) 05/2016  . Crohn's disease ([place])   . Dysrhythmia    afib dx approx. 2016 per pt  . Hip fracture ([place])   . Hypertension     Tobacco History: Social History   Tobacco Use  Smoking Status Former Smoker  . Packs/day: 0.50  . Years: 30.00  . Pack years: 15.00  . Types: Cigarettes  . Start date: 46  . Quit date: 51  . Years since quitting: 40.7  Smokeless Tobacco Never Used   Counseling given: Not Answered   Outpatient Medications Prior to Visit  Medication Sig Dispense Refill  . acetaminophen (TYLENOL) 500 MG tablet Take 1,000 mg by mouth daily as needed for moderate pain or headache.    . alendronate (FOSAMAX) 70 MG tablet Take 70 mg by mouth every Saturday. Take with a full glass of water on an empty stomach.    Marland Kitchen amiodarone (PACERONE) 200 MG tablet Take 1 tablet (200 mg total) by mouth daily. 180 tablet 1  . carvedilol (COREG) 12.5 MG tablet Take 12.5 mg by mouth 2 (two) times daily with a meal.    . Cholecalciferol (VITAMIN D3) 2000 units TABS Take 2,000 Units by mouth  daily.     . clonazePAM (KLONOPIN) 0.5 MG tablet Take 0.5 mg by mouth 2 (two) times daily.    Marland Kitchen ELIQUIS 2.5 MG TABS tablet TAKE 1 TABLET TWICE DAILY 180 tablet 1  . Ferrous Sulfate (IRON) 28 MG TABS Take 28 mg by mouth daily.     . furosemide (LASIX) 20 MG tablet Take 20 mg by mouth daily as needed for edema.     . hydrALAZINE (APRESOLINE) 25 MG tablet Take 25 mg by mouth 2 (two) times daily.     Marland Kitchen latanoprost (XALATAN) 0.005 % ophthalmic solution Place 1 drop into both  eyes at bedtime.    Marland Kitchen loperamide (IMODIUM) 2 MG capsule Take 4 mg by mouth 4 (four) times daily as needed for diarrhea or loose stools.    . mirtazapine (REMERON) 15 MG tablet Take 15 mg by mouth at bedtime.     . potassium chloride (K-DUR) 10 MEQ tablet Take 10 mEq by mouth daily as needed (when taking lasix).     Marland Kitchen umeclidinium-vilanterol (ANORO ELLIPTA) 62.5-25 MCG/INH AEPB Inhale 1 puff into the lungs daily. 60 each 3  . venlafaxine XR (EFFEXOR-XR) 150 MG 24 hr capsule Take 150 mg by mouth 2 (two) times daily.     No facility-administered medications prior to visit.     Review of Systems  Review of Systems  Constitutional: Negative.   HENT: Negative.   Respiratory: Positive for shortness of breath. Negative for cough and wheezing.   Cardiovascular: Negative.    Physical Exam  BP 120/70 (BP Location: Left Arm, Cuff Size: Normal)   Pulse 71   Temp 97.6 F (36.4 C) (Temporal)   Ht 4' 11"  (1.499 m)   Wt 103 lb 12.8 oz (47.1 kg)   SpO2 97%   BMI 20.97 kg/m  Physical Exam Constitutional:      General: She is not in acute distress.    Appearance: Normal appearance. She is not ill-appearing.  HENT:     Head: Normocephalic and atraumatic.     Mouth/Throat:     Mouth: Mucous membranes are moist.     Pharynx: Oropharynx is clear.  Cardiovascular:     Rate and Rhythm: Normal rate and regular rhythm.     Comments: RRR Pulmonary:     Effort: Pulmonary effort is normal.     Breath sounds: Normal breath sounds. No wheezing or rhonchi.     Comments: CTA Musculoskeletal: Normal range of motion.  Skin:    General: Skin is warm and dry.  Neurological:     General: No focal deficit present.     Mental Status: She is alert and oriented to person, place, and time. Mental status is at baseline.  Psychiatric:        Mood and Affect: Mood normal.        Behavior: Behavior normal.        Thought Content: Thought content normal.        Judgment: Judgment normal.      Lab Results:   CBC    Component Value Date/Time   WBC 5.9 01/30/2019 1351   WBC 11.1 (H) 03/29/2018 0622   RBC 3.65 (L) 01/30/2019 1351   RBC 3.26 (L) 03/29/2018 1327   RBC 3.34 (L) 03/29/2018 0622   HGB 10.0 (L) 01/30/2019 1351   HCT 31.5 (L) 01/30/2019 1351   PLT 243 01/30/2019 1351   MCV 86 01/30/2019 1351   MCH 27.4 01/30/2019 1351   MCH 26.6 03/29/2018 0622  MCHC 31.7 01/30/2019 1351   MCHC 29.2 (L) 03/29/2018 0622   RDW 13.0 01/30/2019 1351   LYMPHSABS 0.8 01/30/2019 1351   MONOABS 0.6 03/29/2018 0622   EOSABS 0.1 01/30/2019 1351   BASOSABS 0.1 01/30/2019 1351    BMET    Component Value Date/Time   NA 137 01/30/2019 1351   K 5.6 (H) 01/30/2019 1351   CL 103 01/30/2019 1351   CO2 22 01/30/2019 1351   GLUCOSE 76 01/30/2019 1351   GLUCOSE 92 07/03/2018 1249   BUN 23 01/30/2019 1351   CREATININE 1.73 (H) 01/30/2019 1351   CALCIUM 8.8 01/30/2019 1351   GFRNONAA 27 (L) 01/30/2019 1351   GFRAA 31 (L) 01/30/2019 1351    BNP    Component Value Date/Time   BNP 367.9 (H) 03/29/2018 0627    ProBNP    Component Value Date/Time   PROBNP 3,103 (H) 08/20/2017 1119    Imaging: No results found.   Assessment & Plan:   Centrilobular emphysema (HCC) - Increased dyspnea on exertion x 1 week. Absent cough or wheezing. - Symptoms not consistent with COPD exacerbation, low suspicion for PE d/t current anticoagulation  - Continue Anoro 1 puff daily - Add Albuterol hfa two puffs every 6 hours for breakthrough shortness of breath  - Checking BNP, CBC and CMET to rule out other causes of dyspnea  - Scheduled for PFTs on 10/19 with OV afterwards   Bronchiectasis without complication ([place]) - Stable. No signs or symptoms of exacerbation   Chronic combined systolic and diastolic heart failure (HCC) - No signs of acute exacerbation on exam  - Ordered for BNP today  - Currently taking Lasix 58m as needed   A-fib ([facility] - S/p ablation in July 2020 - Regular rate and rhythm on exam  today - Continues Eliquis 2.582mBID and Coreg 12.9m2mID  - Following with cardiology, next apt December 4th 2020    EliMartyn EhrichP 04/15/2019

## 2019-04-15 NOTE — Telephone Encounter (Signed)
Called spoke with patient, states in the last 7-10 days she is getting winded with activity. She doesn't think the Anoro is working for her anymore and wants to know what else can be done. Doesn't want to get refill if there is something that would be better for her.  appt made with Beth at 1500 nothing further needed

## 2019-04-15 NOTE — Assessment & Plan Note (Signed)
-   Increased dyspnea on exertion x 1 week. Absent cough or wheezing. - Symptoms not consistent with COPD exacerbation, low suspicion for PE d/t current anticoagulation  - Continue Anoro 1 puff daily - Add Albuterol hfa two puffs every 6 hours for breakthrough shortness of breath  - Checking BNP, CBC and CMET to rule out other causes of dyspnea  - Scheduled for PFTs on 10/19 with OV afterwards

## 2019-04-15 NOTE — Assessment & Plan Note (Addendum)
-   S/p ablation in July 2020 - Regular rate and rhythm on exam today - Continues Eliquis 2.58m BID and Coreg 12.51mBID  - Following with cardiology, next apt December 4th 2020

## 2019-04-15 NOTE — Assessment & Plan Note (Addendum)
-   No signs of acute exacerbation on exam  - Ordered for BNP today  - Currently taking Lasix 8m as needed

## 2019-04-15 NOTE — Patient Instructions (Addendum)
  Orders: Labs today   RX: Albuterol rescue inhaler - 2 puffs every 4-6 hours as needed for breakthrough shortness of breath/wheezing  Recommendations: Continue Anoro 1 puff daily as prescribed   Follow-up 10/19 at 11am for PFTs and then office visit with Dr. Loanne Drilling

## 2019-04-15 NOTE — Assessment & Plan Note (Signed)
-   Stable. No signs or symptoms of exacerbation

## 2019-04-15 NOTE — Telephone Encounter (Signed)
Patient is calling back regarding previous message. CB is [phone]

## 2019-04-16 LAB — COMPREHENSIVE METABOLIC PANEL
ALT: 16 U/L (ref 0–35)
AST: 23 U/L (ref 0–37)
Albumin: 3.8 g/dL (ref 3.5–5.2)
Alkaline Phosphatase: 83 U/L (ref 39–117)
BUN: 27 mg/dL — ABNORMAL HIGH (ref 6–23)
CO2: 25 mEq/L (ref 19–32)
Calcium: 8.7 mg/dL (ref 8.4–10.5)
Chloride: 106 mEq/L (ref 96–112)
Creatinine, Ser: 1.9 mg/dL — ABNORMAL HIGH (ref 0.40–1.20)
GFR: 25.18 mL/min — ABNORMAL LOW (ref >60.00)
Glucose, Bld: 82 mg/dL (ref 70–99)
Potassium: 5.2 mEq/L — ABNORMAL HIGH (ref 3.5–5.1)
Sodium: 136 mEq/L (ref 135–145)
Total Bilirubin: 0.3 mg/dL (ref 0.2–1.2)
Total Protein: 6.8 g/dL (ref 6.0–8.3)

## 2019-04-16 LAB — CBC WITH DIFFERENTIAL/PLATELET
Basophils Absolute: 0.1 10*3/uL (ref 0.0–0.1)
Basophils Relative: 1 % (ref 0.0–3.0)
Eosinophils Absolute: 0.2 10*3/uL (ref 0.0–0.7)
Eosinophils Relative: 2.8 % (ref 0.0–5.0)
HCT: 32.6 % — ABNORMAL LOW (ref 36.0–46.0)
Hemoglobin: 10.4 g/dL — ABNORMAL LOW (ref 12.0–15.0)
Lymphocytes Relative: 14.3 % (ref 12.0–46.0)
Lymphs Abs: 0.8 10*3/uL (ref 0.7–4.0)
MCHC: 31.8 g/dL (ref 30.0–36.0)
MCV: 86.6 fl (ref 78.0–100.0)
Monocytes Absolute: 0.5 10*3/uL (ref 0.1–1.0)
Monocytes Relative: 9.4 % (ref 3.0–12.0)
Neutro Abs: 4.1 10*3/uL (ref 1.4–7.7)
Neutrophils Relative %: 72.5 % (ref 43.0–77.0)
Platelets: 231 10*3/uL (ref 150.0–400.0)
RBC: 3.77 Mil/uL — ABNORMAL LOW (ref 3.87–5.11)
RDW: 14.9 % (ref 11.5–15.5)
WBC: 5.7 10*3/uL (ref 4.0–10.5)

## 2019-04-16 LAB — BRAIN NATRIURETIC PEPTIDE: Pro B Natriuretic peptide (BNP): 270 pg/mL — ABNORMAL HIGH (ref 0.0–100.0)

## 2019-04-17 ENCOUNTER — Telehealth: Payer: Self-pay | Admitting: Pulmonary Disease

## 2019-04-17 NOTE — Telephone Encounter (Signed)
Pt made aware of lab results and recommendations Nothing further needed.

## 2019-04-17 NOTE — Progress Notes (Signed)
That sounds fine. Thank you.

## 2019-04-17 NOTE — Progress Notes (Signed)
Please let patient know her BNP was elevated which means she could be holding on to some extra fluid, I recommended taking her as needed LASIX today and again on Sunday. Kidney function and potassium are elevated but appear close to her baseline. Recommend she follow-up with cardiology for dyspnea and elevated fluid level and nephrologist for kidney function.

## 2019-04-24 DIAGNOSIS — S2231XA Fracture of one rib, right side, initial encounter for closed fracture: Secondary | ICD-10-CM | POA: Diagnosis not present

## 2019-05-06 ENCOUNTER — Other Ambulatory Visit: Payer: Self-pay

## 2019-05-06 ENCOUNTER — Emergency Department (HOSPITAL_COMMUNITY): Payer: Medicare Other

## 2019-05-06 ENCOUNTER — Encounter (HOSPITAL_COMMUNITY): Payer: Self-pay | Admitting: Emergency Medicine

## 2019-05-06 ENCOUNTER — Emergency Department (HOSPITAL_COMMUNITY)
Admission: EM | Admit: 2019-05-06 | Discharge: 2019-05-06 | Disposition: A | Discharge status: Home / Self Care | Payer: Medicare Other | Attending: Emergency Medicine | Admitting: Emergency Medicine

## 2019-05-06 DIAGNOSIS — W01190D Fall on same level from slipping, tripping and stumbling with subsequent striking against furniture, subsequent encounter: Secondary | ICD-10-CM | POA: Insufficient documentation

## 2019-05-06 DIAGNOSIS — I130 Hypertensive heart and chronic kidney disease with heart failure and stage 1 through stage 4 chronic kidney disease, or unspecified chronic kidney disease: Secondary | ICD-10-CM | POA: Insufficient documentation

## 2019-05-06 DIAGNOSIS — I5042 Chronic combined systolic (congestive) and diastolic (congestive) heart failure: Secondary | ICD-10-CM | POA: Diagnosis not present

## 2019-05-06 DIAGNOSIS — Z87891 Personal history of nicotine dependence: Secondary | ICD-10-CM | POA: Insufficient documentation

## 2019-05-06 DIAGNOSIS — S2243XD Multiple fractures of ribs, bilateral, subsequent encounter for fracture with routine healing: Secondary | ICD-10-CM | POA: Insufficient documentation

## 2019-05-06 DIAGNOSIS — S2243XA Multiple fractures of ribs, bilateral, initial encounter for closed fracture: Secondary | ICD-10-CM

## 2019-05-06 DIAGNOSIS — S2222XD Fracture of body of sternum, subsequent encounter for fracture with routine healing: Secondary | ICD-10-CM | POA: Diagnosis not present

## 2019-05-06 DIAGNOSIS — S2222XA Fracture of body of sternum, initial encounter for closed fracture: Secondary | ICD-10-CM

## 2019-05-06 DIAGNOSIS — S2231XS Fracture of one rib, right side, sequela: Secondary | ICD-10-CM | POA: Diagnosis not present

## 2019-05-06 DIAGNOSIS — Z79899 Other long term (current) drug therapy: Secondary | ICD-10-CM | POA: Insufficient documentation

## 2019-05-06 DIAGNOSIS — N183 Chronic kidney disease, stage 3 unspecified: Secondary | ICD-10-CM | POA: Insufficient documentation

## 2019-05-06 DIAGNOSIS — Z7901 Long term (current) use of anticoagulants: Secondary | ICD-10-CM | POA: Diagnosis not present

## 2019-05-06 DIAGNOSIS — S20211A Contusion of right front wall of thorax, initial encounter: Secondary | ICD-10-CM | POA: Diagnosis not present

## 2019-05-06 LAB — BASIC METABOLIC PANEL
Anion gap: 9 (ref 5–15)
BUN: 25 mg/dL — ABNORMAL HIGH (ref 8–23)
CO2: 21 mmol/L — ABNORMAL LOW (ref 22–32)
Calcium: 8.1 mg/dL — ABNORMAL LOW (ref 8.9–10.3)
Chloride: 108 mmol/L (ref 98–111)
Creatinine, Ser: 1.87 mg/dL — ABNORMAL HIGH (ref 0.44–1.00)
GFR calc Af Amer: 28 mL/min — ABNORMAL LOW (ref >60)
GFR calc non Af Amer: 24 mL/min — ABNORMAL LOW (ref >60)
Glucose, Bld: 95 mg/dL (ref 70–99)
Potassium: 5.7 mmol/L — ABNORMAL HIGH (ref 3.5–5.1)
Sodium: 138 mmol/L (ref 135–145)

## 2019-05-06 LAB — CBC
HCT: 30.1 % — ABNORMAL LOW (ref 36.0–46.0)
Hemoglobin: 9.4 g/dL — ABNORMAL LOW (ref 12.0–15.0)
MCH: 28.6 pg (ref 26.0–34.0)
MCHC: 31.2 g/dL (ref 30.0–36.0)
MCV: 91.5 fL (ref 80.0–100.0)
Platelets: 209 10*3/uL (ref 150–400)
RBC: 3.29 MIL/uL — ABNORMAL LOW (ref 3.87–5.11)
RDW: 14.2 % (ref 11.5–15.5)
WBC: 5 10*3/uL (ref 4.0–10.5)
nRBC: 0 % (ref 0.0–0.2)

## 2019-05-06 LAB — TROPONIN I (HIGH SENSITIVITY): Troponin I (High Sensitivity): 6 ng/L (ref <18)

## 2019-05-06 MED ORDER — SODIUM CHLORIDE 0.9% FLUSH: 3.0000 mL | Freq: Once | INTRAVENOUS | Status: DC

## 2019-05-06 MED ORDER — HYDROCODONE-ACETAMINOPHEN 5-325 MG PO TABS: 1.0000 | ORAL_TABLET | Freq: Four times a day (QID) | ORAL | 0 refills | Status: DC | PRN

## 2019-05-06 NOTE — ED Notes (Signed)
Patient transported to CT 

## 2019-05-06 NOTE — ED Notes (Signed)
Patient Alert and oriented to baseline. Stable and ambulatory to baseline. Patient verbalized understanding of the discharge instructions.  Patient belongings were taken by the patient.   

## 2019-05-06 NOTE — ED Triage Notes (Signed)
Patient arrived from doctors office with complaints of mid chest pain post fall with right rib fracture October 1. Denies pain with inspiration or shortness of pain. Increased pain when laying down flat. Denies pain at this time.

## 2019-05-06 NOTE — ED Provider Notes (Signed)
[facility] EMERGENCY DEPARTMENT Provider Note   CSN: [id number] Arrival date & time: 05/06/19  1159     History   Chief Complaint Chief Complaint  Patient presents with  . Chest Pain    HPI Melissa Mcdaniel is a 85 y.o. female.     Patient is an 85 year old female with past medical history of atrial fibrillation on Eliquis, hypertension, chronic renal insufficiency, Crohn's disease.  She presents today for evaluation of chest pain resulting from a fall.  Patient states she fell in her living room approximately 1 week ago and landed on her chest on a piece of furniture.  She was seen by her primary doctor and diagnosed with a rib fracture.  Patient is having ongoing pain that is unrelieved with prescribed medication.  She was seen by her doctor today who sent her here for a CT scan.  The pain is to the right anterior middle chest and is worse when she breathes or presses on the area.  She denies any fevers, chills, or cough.  The history is provided by the patient.  Chest Pain Pain location:  R chest Pain quality: sharp   Pain radiates to:  Does not radiate Pain severity:  Moderate Onset quality:  Sudden Duration:  1 week Timing:  Constant Progression:  Worsening Chronicity:  New Relieved by:  Nothing Worsened by:  Movement, deep breathing and certain positions   Past Medical History:  Diagnosis Date  . A-fib ([place])   . Chronic kidney disease   . Congestive heart failure (CHF) ([place]) 05/2016  . Crohn's disease ([place])   . Dysrhythmia    afib dx approx. 2016 per pt  . Hip fracture ([place])   . Hypertension     Patient Active Problem List   Diagnosis Date Noted  . Medication management 03/06/2019  . Centrilobular emphysema ([place]) 02/16/2019  . Lung nodule, multiple 02/16/2019  . Bronchiectasis without complication ([place]) 76/54/6503  . Chronic combined systolic and diastolic heart failure ([place]) 03/29/2018  . Elevated troponin 03/29/2018  . CKD  (chronic kidney disease), stage III 03/29/2018  . Anemia 03/29/2018  . Malnutrition of moderate degree 07/26/2016  . Closed right hip fracture, initial encounter ([place]) 07/24/2016  . Hypertension 07/24/2016  . A-fib ([place]) 07/24/2016    Past Surgical History:  Procedure Laterality Date  . CARDIOVERSION N/A 02/04/2019   Procedure: CARDIOVERSION;  Surgeon: Buford Dresser, MD;  Location: [facility] ENDOSCOPY;  Service: Cardiovascular;  Laterality: N/A;  . FEMUR IM NAIL Right 07/26/2016   Procedure: INTRAMEDULLARY (IM) NAIL FEMORAL;  Surgeon: Renette Butters, MD;  Location: [facility];  Service: Orthopedics;  Laterality: Right;  . FRACTURE SURGERY    . large intestine - partial removal  approx 1993   due to crohns     OB History   No obstetric history on file.      Home Medications    Prior to Admission medications   Medication Sig Start Date End Date Taking? Authorizing Provider  acetaminophen (TYLENOL) 500 MG tablet Take 1,000 mg by mouth daily as needed for moderate pain or headache.    [provider]  albuterol (VENTOLIN HFA) 108 (90 Base) MCG/ACT inhaler INHALE 2 PUFFS INTO THE LUNGS EVERY 6 HOURS AS NEEDED FOR WHEEZING OR SHORTNESS OF BREATH 04/21/19   Margaretha Seeds, MD  alendronate (FOSAMAX) 70 MG tablet Take 70 mg by mouth every Saturday. Take with a full glass of water on an empty stomach.    [provider]  amiodarone (PACERONE) 200 MG tablet Take 1 tablet (200 mg total) by mouth daily. 02/10/19   Fenton, Clint R, PA  carvedilol (COREG) 12.5 MG tablet Take 12.5 mg by mouth 2 (two) times daily with a meal.    [provider]  Cholecalciferol (VITAMIN D3) 2000 units TABS Take 2,000 Units by mouth daily.     [provider]  clonazePAM (KLONOPIN) 0.5 MG tablet Take 0.5 mg by mouth 2 (two) times daily.    [provider]  ELIQUIS 2.5 MG TABS tablet TAKE 1 TABLET TWICE DAILY 02/02/19   Burnell Blanks, MD  Ferrous Sulfate (IRON) 28  MG TABS Take 28 mg by mouth daily.     [provider]  furosemide (LASIX) 20 MG tablet Take 20 mg by mouth daily as needed for edema.     [provider]  hydrALAZINE (APRESOLINE) 25 MG tablet Take 25 mg by mouth 2 (two) times daily.     [provider]  latanoprost (XALATAN) 0.005 % ophthalmic solution Place 1 drop into both eyes at bedtime.    [provider]  loperamide (IMODIUM) 2 MG capsule Take 4 mg by mouth 4 (four) times daily as needed for diarrhea or loose stools.    [provider]  mirtazapine (REMERON) 15 MG tablet Take 15 mg by mouth at bedtime.     [provider]  potassium chloride (K-DUR) 10 MEQ tablet Take 10 mEq by mouth daily as needed (when taking lasix).     [provider]  umeclidinium-vilanterol (ANORO ELLIPTA) 62.5-25 MCG/INH AEPB Inhale 1 puff into the lungs daily. 03/13/19   Lauraine Rinne, NP  venlafaxine XR (EFFEXOR-XR) 150 MG 24 hr capsule Take 150 mg by mouth 2 (two) times daily.    [provider]    Family History Family History  Problem Relation Age of Onset  . Lung cancer Mother   . Alzheimer's disease Father   . Diabetes Maternal Grandmother     Social History Social History   Tobacco Use  . Smoking status: Former Smoker    Packs/day: 0.50    Years: 30.00    Pack years: 15.00    Types: Cigarettes    Start date: 59    Quit date: 1980    Years since quitting: 40.8  . Smokeless tobacco: Never Used  Substance Use Topics  . Alcohol use: No  . Drug use: No     Allergies   Penicillins   Review of Systems Review of Systems  Cardiovascular: Positive for chest pain.  All other systems reviewed and are negative.    Physical Exam Updated Vital Signs BP (!) 170/60   Pulse 60   Temp 98 F (36.7 C) (Oral)   Resp 15   Ht 5' (1.524 m)   Wt 46.7 kg   SpO2 96%   BMI 20.12 kg/m   Physical Exam Vitals signs and nursing note reviewed.  Constitutional:      General:  She is not in acute distress.    Appearance: She is well-developed. She is not diaphoretic.  HENT:     Head: Normocephalic and atraumatic.  Neck:     Musculoskeletal: Normal range of motion and neck supple.  Cardiovascular:     Rate and Rhythm: Normal rate and regular rhythm.     Heart sounds: No murmur. No friction rub. No gallop.   Pulmonary:     Effort: Pulmonary effort is normal. No respiratory distress.  Breath sounds: Normal breath sounds. No wheezing.     Comments: There is tenderness to palpation of the right middle anterior chest wall behind the right breast.  This reproduces her symptoms.  There is no crepitus and breath sounds are equal bilaterally. Abdominal:     General: Bowel sounds are normal. There is no distension.     Palpations: Abdomen is soft.     Tenderness: There is no abdominal tenderness.  Musculoskeletal: Normal range of motion.  Skin:    General: Skin is warm and dry.  Neurological:     Mental Status: She is alert and oriented to person, place, and time.      ED Treatments / Results  Labs (all labs ordered are listed, but only abnormal results are displayed) Labs Reviewed  BASIC METABOLIC PANEL - Abnormal; Notable for the following components:      Result Value   Potassium 5.7 (*)    CO2 21 (*)    BUN 25 (*)    Creatinine, Ser 1.87 (*)    Calcium 8.1 (*)    GFR calc non Af Amer 24 (*)    GFR calc Af Amer 28 (*)    All other components within normal limits  CBC - Abnormal; Notable for the following components:   RBC 3.29 (*)    Hemoglobin 9.4 (*)    HCT 30.1 (*)    All other components within normal limits  TROPONIN I (HIGH SENSITIVITY)    EKG ED ECG REPORT   Date: 05/06/2019  Rate: 61  Rhythm: normal sinus rhythm  QRS Axis: normal  Intervals: normal  ST/T Wave abnormalities: normal  Conduction Disutrbances:none  Narrative Interpretation:   Old EKG Reviewed: none available  I have personally reviewed the EKG tracing and agree  with the computerized printout as noted.   Radiology No results found.  Procedures Procedures (including critical care time)  Medications Ordered in ED Medications  sodium chloride flush (NS) 0.9 % injection 3 mL (has no administration in time range)     Initial Impression / Assessment and Plan / ED Course  I have reviewed the triage vital signs and the nursing notes.  Pertinent labs & imaging results that were available during my care of the patient were reviewed by me and considered in my medical decision making (see chart for details).  Patient presenting here with ongoing pain in her chest after a fall over 1 week ago.  She was sent by her primary doctor to have a CT scan performed.  CAT scan did show a nondisplaced sternal fracture and nondisplaced fractures of the anterior aspect of the bilateral second ribs.  As this injury occurred greater than 1 week ago, I believe her to be stable and appropriate for discharge.  She will be given additional pain medication and is to follow-up with primary doctor for any problems.  Final Clinical Impressions(s) / ED Diagnoses   Final diagnoses:  None    ED Discharge Orders    None       Veryl Speak, MD 05/06/19 1525

## 2019-05-06 NOTE — Discharge Instructions (Signed)
Begin taking hydrocodone as prescribed as needed for pain.  Continue other medications as previously prescribed.  Return to the emergency department if you develop worsening pain, difficulty breathing, high fever, or other new and concerning symptoms.

## 2019-05-11 ENCOUNTER — Ambulatory Visit: Payer: Medicare Other | Admitting: Pulmonary Disease

## 2019-05-14 DIAGNOSIS — Z0000 Encounter for general adult medical examination without abnormal findings: Secondary | ICD-10-CM | POA: Diagnosis not present

## 2019-05-14 DIAGNOSIS — H6121 Impacted cerumen, right ear: Secondary | ICD-10-CM | POA: Diagnosis not present

## 2019-05-14 DIAGNOSIS — D638 Anemia in other chronic diseases classified elsewhere: Secondary | ICD-10-CM | POA: Diagnosis not present

## 2019-05-14 DIAGNOSIS — I129 Hypertensive chronic kidney disease with stage 1 through stage 4 chronic kidney disease, or unspecified chronic kidney disease: Secondary | ICD-10-CM | POA: Diagnosis not present

## 2019-05-14 DIAGNOSIS — I700 Atherosclerosis of aorta: Secondary | ICD-10-CM | POA: Diagnosis not present

## 2019-05-14 DIAGNOSIS — I5042 Chronic combined systolic (congestive) and diastolic (congestive) heart failure: Secondary | ICD-10-CM | POA: Diagnosis not present

## 2019-05-14 DIAGNOSIS — H9193 Unspecified hearing loss, bilateral: Secondary | ICD-10-CM | POA: Diagnosis not present

## 2019-05-14 DIAGNOSIS — N1832 Chronic kidney disease, stage 3b: Secondary | ICD-10-CM | POA: Diagnosis not present

## 2019-05-14 DIAGNOSIS — I4819 Other persistent atrial fibrillation: Secondary | ICD-10-CM | POA: Diagnosis not present

## 2019-05-14 DIAGNOSIS — R946 Abnormal results of thyroid function studies: Secondary | ICD-10-CM | POA: Diagnosis not present

## 2019-05-14 DIAGNOSIS — F339 Major depressive disorder, recurrent, unspecified: Secondary | ICD-10-CM | POA: Diagnosis not present

## 2019-05-14 DIAGNOSIS — M810 Age-related osteoporosis without current pathological fracture: Secondary | ICD-10-CM | POA: Diagnosis not present

## 2019-05-14 DIAGNOSIS — Z23 Encounter for immunization: Secondary | ICD-10-CM | POA: Diagnosis not present

## 2019-06-11 DIAGNOSIS — M810 Age-related osteoporosis without current pathological fracture: Secondary | ICD-10-CM | POA: Diagnosis not present

## 2019-06-11 DIAGNOSIS — Z780 Asymptomatic menopausal state: Secondary | ICD-10-CM | POA: Diagnosis not present

## 2019-06-20 ENCOUNTER — Other Ambulatory Visit: Payer: Self-pay | Admitting: Cardiovascular Disease

## 2019-06-22 NOTE — Telephone Encounter (Signed)
Prescription refill request for Eliquis received.  Last office visit: 8/7/2020[name] Scr: 1.87, 05/06/2019 Age: 85 y.o. Weight: 47.1 kg   Prescription refill sent.

## 2019-06-24 IMAGING — CR DG THORACIC SPINE 3V
3 series · 3 of 3 positions shown · non-contrast
Comparison: Chest x-ray dated August 16, 2016.

CLINICAL DATA: Acute midline thoracic back pain.

EXAM:
THORACIC SPINE - 3 VIEWS

[w thoracic spine ap]
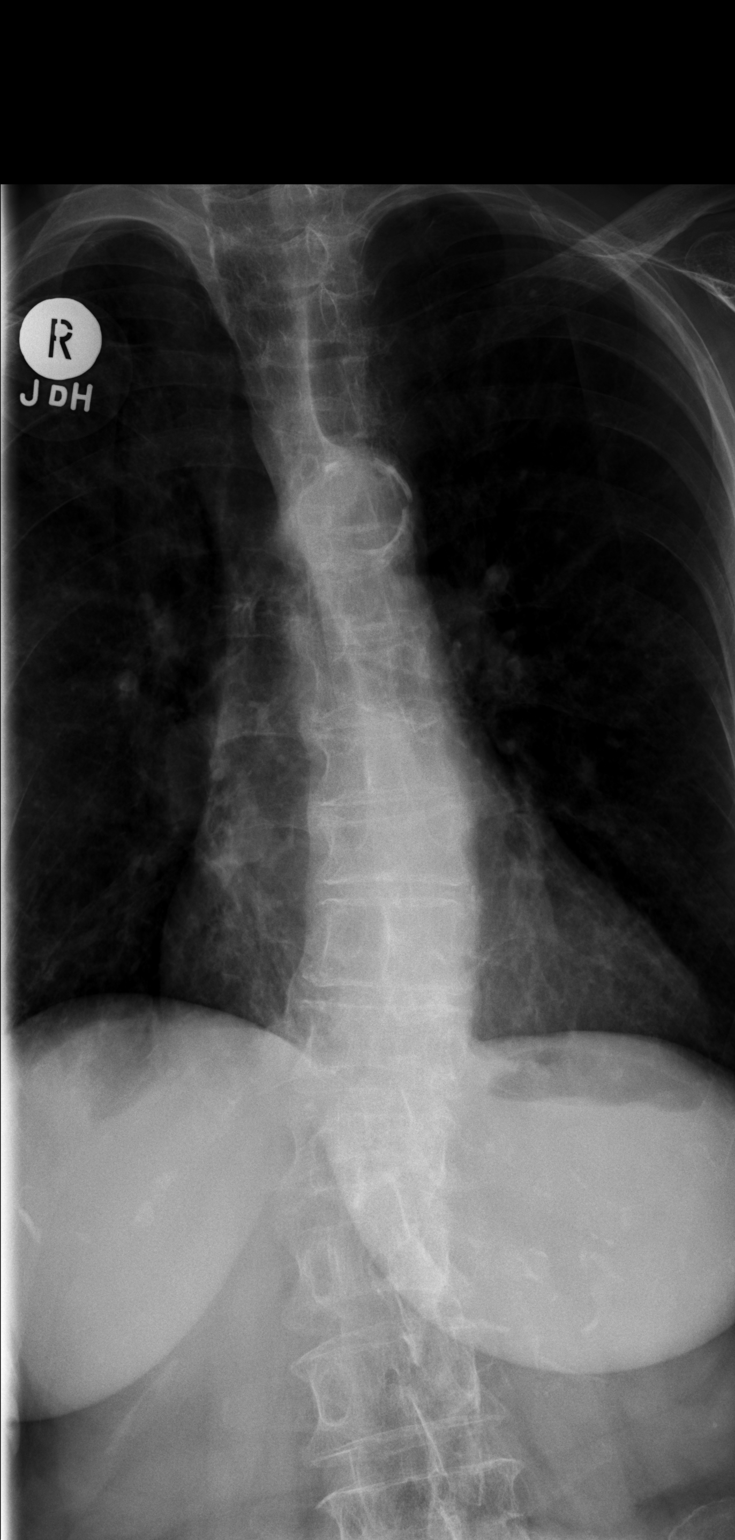

[w thoracic spine lat]
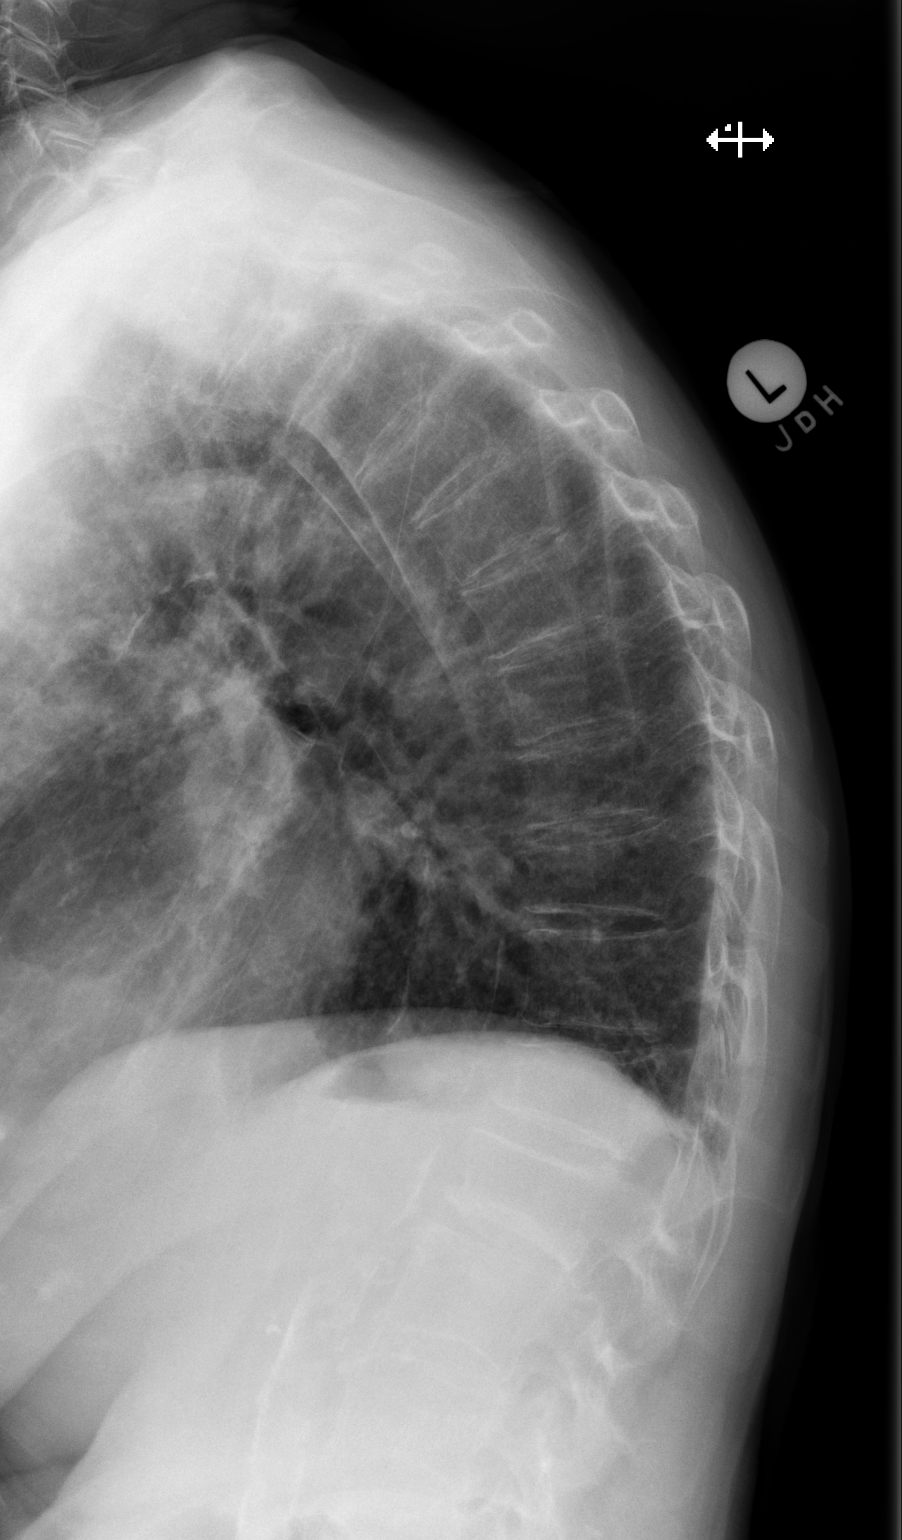

[w thoracic swimmers]
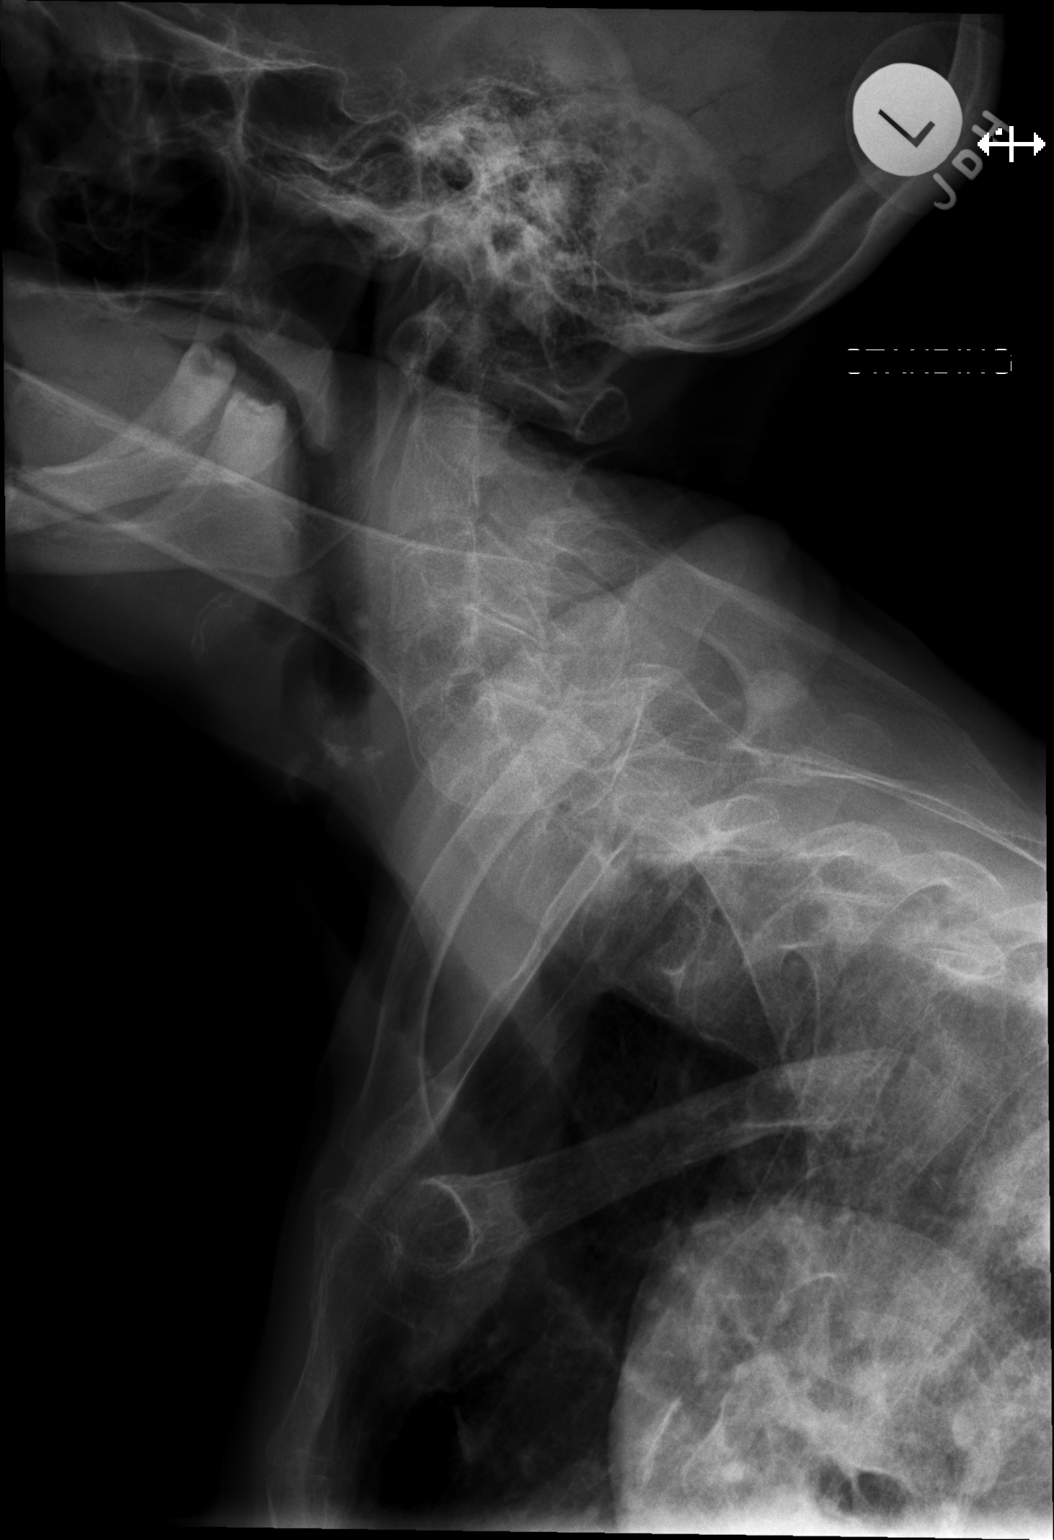

[3 of 3 positions shown; findings below may reference images not displayed]

FINDINGS: There is no evidence of acute thoracic spine fracture. Mild anterior
wedging of T10, T12, and L1 is unchanged. No definite new vertebral
body height loss. Slightly exaggerated thoracic kyphosis. Sagittal
alignment is normal. Osteopenia. No other significant bone
abnormalities are identified.
IMPRESSION: No definite acute thoracic spine fracture. Chronic mild compression
deformities of T10, T12, and L1 are unchanged.

## 2019-06-25 NOTE — Progress Notes (Signed)
Evaluation Performed:  Follow-up visit  Date:  06/26/2019   ID:  Melissa Mcdaniel, DOB 03-06-1933, MRN [id number]  Chief Complaint:  Chief Complaint  Patient presents with  . Follow-up    atrial fibrillation    History of Present Illness:    Melissa Mcdaniel is a 85 y.o. female with history of HTN, CAD, chronic combined diastolic/systolic CHF, persistent atrial fibrillation, Crohn's disease and colon cancer who is here today for follow up. I saw her as a new patient in February 2018. She had recently moved from [facility] to [place] to be near her daughter. She is known to have atrial fibrillation. She has been on Eliquis. She had a cardioversion in December 2015 and was started on amiodarone at that time. Echo in November 2017 at [facility] showed LVEF=35-40% with global HK, moderate MR, mild to moderate TR. She had a cardiac cath in 2002 showing 30% ostial RCA stenosis. She has had colon cancer and has had a colon resection. No evidence of carotid disease by dopplers 2010. Stress test in 2012 did not show ischemia. I saw her in December 2018 with c/o dyspnea but no cough, fever, sick contacts or LE edema. Chest x-ray was clear. Echo 07/05/17 with LVEF=50-55%. Mild AI and mild MR.  D-dimer was slightly elevated. Chest CTA without evidence of PE. Her BNP was elevated so Lasix was increased. Her dyspnea resolved but renal function worsened with higher doses of Lasix. She has been using Lasix as needed since then. She was admitted to [facility] 03/29/18 with respiratory distress with likely acute diastolic CHF and possible pneumonia. She was diuresed and improved rapidly. Mild troponin elevation but no chest pain. Echo 03/31/18 with LVEF=55-60% with no wall motion abnormalilties, grade 2 diastolic dysfunction. There was mild AI. She was cardioverted to sinus rhythm in July 2020.   She is here today for follow up. The patient denies any chest pain, dyspnea, palpitations, lower extremity edema,  orthopnea, PND, dizziness, near syncope or syncope.   Primary Care Physician: Harlan Stains, MD   Past Medical History:  Diagnosis Date  . A-fib ([place])   . Chronic kidney disease   . Congestive heart failure (CHF) ([place]) 05/2016  . Crohn's disease ([place])   . Dysrhythmia    afib dx approx. 2016 per pt  . Hip fracture ([place])   . Hypertension    Past Surgical History:  Procedure Laterality Date  . CARDIOVERSION N/A 02/04/2019   Procedure: CARDIOVERSION;  Surgeon: Buford Dresser, MD;  Location[facility] ENDOSCOPY;  Service: Cardiovascular;  Laterality: N/A;  . FEMUR IM NAIL Right 07/26/2016   Procedure: INTRAMEDULLARY (IM) NAIL FEMORAL;  Surgeon: Renette Butters, MD;  Location: [place];  Service: Orthopedics;  Laterality: Right;  . FRACTURE SURGERY    . large intestine - partial removal  approx 1993   due to crohns     Current Meds  Medication Sig  . acetaminophen (TYLENOL) 500 MG tablet Take 1,000 mg by mouth daily as needed for moderate pain or headache.  . albuterol (VENTOLIN HFA) 108 (90 Base) MCG/ACT inhaler INHALE 2 PUFFS INTO THE LUNGS EVERY 6 HOURS AS NEEDED FOR WHEEZING OR SHORTNESS OF BREATH  . alendronate (FOSAMAX) 70 MG tablet Take 70 mg by mouth every Saturday. Take with a full glass of water on an empty stomach.  Marland Kitchen amiodarone (PACERONE) 200 MG tablet Take 1 tablet (200 mg total) by mouth daily.  . carvedilol (COREG) 12.5 MG tablet Take 12.5 mg by mouth 2 (two)  times daily with a meal.  . Cholecalciferol (VITAMIN D3) 2000 units TABS Take 2,000 Units by mouth daily.   . clonazePAM (KLONOPIN) 0.5 MG tablet Take 0.5 mg by mouth 2 (two) times daily.  Marland Kitchen ELIQUIS 2.5 MG TABS tablet TAKE 1 TABLET TWICE DAILY  . Ferrous Sulfate (IRON) 28 MG TABS Take 28 mg by mouth daily.   . furosemide (LASIX) 20 MG tablet Take 20 mg by mouth daily as needed for edema.   . hydrALAZINE (APRESOLINE) 25 MG tablet Take 25 mg by mouth 2 (two) times daily.   Marland Kitchen HYDROcodone-acetaminophen (NORCO) 5-325 MG  tablet Take 1-2 tablets by mouth every 6 (six) hours as needed.  . latanoprost (XALATAN) 0.005 % ophthalmic solution Place 1 drop into both eyes at bedtime.  Marland Kitchen loperamide (IMODIUM) 2 MG capsule Take 4 mg by mouth 4 (four) times daily as needed for diarrhea or loose stools.  . mirtazapine (REMERON) 15 MG tablet Take 15 mg by mouth at bedtime.   . potassium chloride (K-DUR) 10 MEQ tablet Take 10 mEq by mouth daily as needed (when taking lasix).   Marland Kitchen umeclidinium-vilanterol (ANORO ELLIPTA) 62.5-25 MCG/INH AEPB Inhale 1 puff into the lungs daily.  Marland Kitchen venlafaxine XR (EFFEXOR-XR) 150 MG 24 hr capsule Take 150 mg by mouth 2 (two) times daily.     Allergies:   Penicillins   Social History   Tobacco Use  . Smoking status: Former Smoker    Packs/day: 0.50    Years: 30.00    Pack years: 15.00    Types: Cigarettes    Start date: 40    Quit date: 1980    Years since quitting: 40.9  . Smokeless tobacco: Never Used  Substance Use Topics  . Alcohol use: No  . Drug use: No     Family Hx: The patient's family history includes Alzheimer's disease in her father; Diabetes in her maternal grandmother; Lung cancer in her mother.  ROS:   Please see the history of present illness.    All other systems reviewed and are negative.   Prior CV studies:   The following studies were reviewed today:  Echo 03/31/18: Left ventricle: The cavity size was normal. There was mild concentric hypertrophy. Systolic function was normal. The estimated ejection fraction was in the range of 55% to 60%. Wall motion was normal; there were no regional wall motion abnormalities. Features are consistent with a pseudonormal left ventricular filling pattern, with concomitant abnormal relaxation and increased filling pressure (grade 2 diastolic dysfunction). Doppler parameters are consistent with high ventricular filling pressure. - Aortic valve: Transvalvular velocity was within the normal range. There  was no stenosis. There was mild regurgitation. Valve area (VTI): 1.45 cm^2. Valve area (Vmax): 1.56 cm^2. Valve area (Vmean): 1.53 cm^2. - Mitral valve: Transvalvular velocity was within the normal range. There was no evidence for stenosis. There was trivial regurgitation. - Left atrium: The atrium was severely dilated. - Right ventricle: The cavity size was normal. Wall thickness was normal. Systolic function was normal. - Atrial septum: No defect or patent foramen ovale was identified by color flow Doppler. - Tricuspid valve: There was mild regurgitation. - Pulmonary arteries: Systolic pressure was within the normal range. PA peak pressure: 35 mm Hg (S).  Labs/Other Tests and Data Reviewed:    EKG:  EKG is not performed today The EKG is personally reviewed by me and shows   Recent Labs: 04/15/2019: ALT 16; Pro B Natriuretic peptide (BNP) 270.0 05/06/2019: BUN 25; Creatinine, Ser  1.87; Hemoglobin 9.4; Platelets 209; Potassium 5.7; Sodium 138   Recent Lipid Panel No results found for: CHOL, TRIG, HDL, CHOLHDL, LDLCALC, LDLDIRECT  Wt Readings from Last 3 Encounters:  06/26/19 101 lb (45.8 kg)  05/06/19 103 lb (46.7 kg)  04/15/19 103 lb 12.8 oz (47.1 kg)     Objective:    Vital Signs:  BP 122/64   Pulse 62   Ht 5' (1.524 m)   Wt 101 lb (45.8 kg)   SpO2 95%   BMI 19.73 kg/m    General: Well developed, well nourished, NAD  HEENT: OP clear, mucus membranes moist  SKIN: warm, dry. No rashes. Neuro: No focal deficits  Musculoskeletal: Muscle strength 5/5 all ext  Psychiatric: Mood and affect normal  Neck: No JVD, no carotid bruits, no thyromegaly, no lymphadenopathy.  Lungs:Clear bilaterally, no wheezes, rhonci, crackles Cardiovascular: Regular rate and rhythm. No murmurs, gallops or rubs. Abdomen:Soft. Bowel sounds present. Non-tender.  Extremities: No lower extremity edema. Pulses are 2 + in the bilateral DP/PT.  ASSESSMENT & PLAN:    1. Atrial  fibrillation,paroxysmal: She appears to be in sinus today. Continue amiodarone, Coreg and Eliquis.    2. Chronic combined systolic and diastolic CHF:  Weight is stable. No evidence of volume overload on exam. Continue Lasix as needed.     3. HTN: BP is well controlled.   4. CAD without angina: Mild CAD by cath in 2002. She has no chest pain. Continue a beta blocker. She has not been on a statin as she has not wished to take one in the past. She has not been on ASA since she has been on Eliquis.    5. Non-ischemic cardiomyopathy: LVEF=55-60% by echo September 2019. Continue beta blocker and hydralazine. No Aceinh/ARB due to renal insufficiency.   Medication Adjustments/Labs and Tests Ordered: Current medicines are reviewed at length with the patient today.  Concerns regarding medicines are outlined above.   Tests Ordered: No orders of the defined types were placed in this encounter.   Medication Changes: No orders of the defined types were placed in this encounter.   Disposition:  Follow up in 6 month(s)  Signed, Lauree Chandler, MD  06/26/2019 10:44 AM    [facility]

## 2019-06-26 ENCOUNTER — Encounter: Payer: Self-pay | Admitting: Cardiovascular Disease

## 2019-06-26 ENCOUNTER — Other Ambulatory Visit: Payer: Self-pay

## 2019-06-26 ENCOUNTER — Ambulatory Visit (INDEPENDENT_AMBULATORY_CARE_PROVIDER_SITE_OTHER): Payer: Medicare Other | Admitting: Cardiovascular Disease

## 2019-06-26 VITALS — BP 122/64 | HR 62 | Ht 60.0 in | Wt 101.0 lb

## 2019-06-26 DIAGNOSIS — I5042 Chronic combined systolic (congestive) and diastolic (congestive) heart failure: Secondary | ICD-10-CM

## 2019-06-26 DIAGNOSIS — I428 Other cardiomyopathies: Secondary | ICD-10-CM

## 2019-06-26 DIAGNOSIS — I480 Paroxysmal atrial fibrillation: Secondary | ICD-10-CM

## 2019-06-26 DIAGNOSIS — I10 Essential (primary) hypertension: Secondary | ICD-10-CM | POA: Diagnosis not present

## 2019-06-26 DIAGNOSIS — I2510 Atherosclerotic heart disease of native coronary artery without angina pectoris: Secondary | ICD-10-CM

## 2019-06-26 NOTE — Patient Instructions (Signed)
Medication Instructions:  No changes *If you need a refill on your cardiac medications before your next appointment, please call your pharmacy*  Lab Work: none If you have labs (blood work) drawn today and your tests are completely normal, you will receive your results only by: Marland Kitchen MyChart Message (if you have MyChart) OR . A paper copy in the mail If you have any lab test that is abnormal or we need to change your treatment, we will call you to review the results.  Testing/Procedures: none  Follow-Up: At [facility], you and your health needs are our priority.  As part of our continuing mission to provide you with exceptional heart care, we have created designated Provider Care Teams.  These Care Teams include your primary Cardiologist (physician) and Advanced Practice Providers (APPs -  Physician Assistants and Nurse Practitioners) who all work together to provide you with the care you need, when you need it.  Your next appointment:   6 month(s)  The format for your next appointment:   In Person  Provider:   Lauree Chandler, MD  Other Instructions

## 2019-07-04 ENCOUNTER — Other Ambulatory Visit: Payer: Self-pay

## 2019-07-04 ENCOUNTER — Emergency Department (HOSPITAL_BASED_OUTPATIENT_CLINIC_OR_DEPARTMENT_OTHER): Payer: Medicare Other

## 2019-07-04 ENCOUNTER — Emergency Department (HOSPITAL_BASED_OUTPATIENT_CLINIC_OR_DEPARTMENT_OTHER)
Admission: EM | Admit: 2019-07-04 | Discharge: 2019-07-04 | Disposition: A | Discharge status: Home / Self Care | Payer: Medicare Other | Attending: Emergency Medicine | Admitting: Emergency Medicine

## 2019-07-04 ENCOUNTER — Encounter (HOSPITAL_BASED_OUTPATIENT_CLINIC_OR_DEPARTMENT_OTHER): Payer: Self-pay | Admitting: Emergency Medicine

## 2019-07-04 ENCOUNTER — Other Ambulatory Visit (HOSPITAL_COMMUNITY): Payer: Medicare Other

## 2019-07-04 DIAGNOSIS — S22000A Wedge compression fracture of unspecified thoracic vertebra, initial encounter for closed fracture: Secondary | ICD-10-CM | POA: Insufficient documentation

## 2019-07-04 DIAGNOSIS — Z7901 Long term (current) use of anticoagulants: Secondary | ICD-10-CM | POA: Insufficient documentation

## 2019-07-04 DIAGNOSIS — I4891 Unspecified atrial fibrillation: Secondary | ICD-10-CM | POA: Diagnosis not present

## 2019-07-04 DIAGNOSIS — Y999 Unspecified external cause status: Secondary | ICD-10-CM | POA: Insufficient documentation

## 2019-07-04 DIAGNOSIS — Z87891 Personal history of nicotine dependence: Secondary | ICD-10-CM | POA: Insufficient documentation

## 2019-07-04 DIAGNOSIS — I130 Hypertensive heart and chronic kidney disease with heart failure and stage 1 through stage 4 chronic kidney disease, or unspecified chronic kidney disease: Secondary | ICD-10-CM | POA: Diagnosis not present

## 2019-07-04 DIAGNOSIS — Z880 Allergy status to penicillin: Secondary | ICD-10-CM | POA: Insufficient documentation

## 2019-07-04 DIAGNOSIS — N183 Chronic kidney disease, stage 3 unspecified: Secondary | ICD-10-CM | POA: Diagnosis not present

## 2019-07-04 DIAGNOSIS — Y929 Unspecified place or not applicable: Secondary | ICD-10-CM | POA: Diagnosis not present

## 2019-07-04 DIAGNOSIS — W109XXA Fall (on) (from) unspecified stairs and steps, initial encounter: Secondary | ICD-10-CM | POA: Insufficient documentation

## 2019-07-04 DIAGNOSIS — I5042 Chronic combined systolic (congestive) and diastolic (congestive) heart failure: Secondary | ICD-10-CM | POA: Diagnosis not present

## 2019-07-04 DIAGNOSIS — W19XXXA Unspecified fall, initial encounter: Secondary | ICD-10-CM

## 2019-07-04 DIAGNOSIS — S299XXA Unspecified injury of thorax, initial encounter: Secondary | ICD-10-CM | POA: Diagnosis present

## 2019-07-04 DIAGNOSIS — Z79899 Other long term (current) drug therapy: Secondary | ICD-10-CM | POA: Insufficient documentation

## 2019-07-04 DIAGNOSIS — Y9301 Activity, walking, marching and hiking: Secondary | ICD-10-CM | POA: Insufficient documentation

## 2019-07-04 MED ORDER — HYDROCODONE-ACETAMINOPHEN 5-325 MG PO TABS: 1.0000 | ORAL_TABLET | Freq: Four times a day (QID) | ORAL | 0 refills | Status: DC | PRN

## 2019-07-04 NOTE — ED Triage Notes (Addendum)
Pt c/o right back pain. Pt fell last Saturday while trying to walk up steps.  On Tuesday she bent over and felt a pop in her back.  Pt able to ambulate slowly. Pt seen at [facility] urgent care on Thursday and had x-rays done, family has copy of x-ray report with them.

## 2019-07-04 NOTE — ED Provider Notes (Signed)
[place] EMERGENCY DEPARTMENT Provider Note   CSN: [id number] Arrival date & time: 07/04/19  4656     History Chief Complaint  Patient presents with  . Fall  . Back Pain    Melissa Mcdaniel is a 85 y.o. female.  HPI    Patient presents with mid back pain after fall.  On Saturday fell try to walk on the stairs landed on her rear end.  Only mild pain at that time but on Tuesday she bent over and felt a pop in her mid back.  Has had pain since.  No relief with Tylenol.  Had x-rays done on Thursday show potential compression fractures to an urgent care.  Has follow-up with Dr. [name] on Monday.  No numbness or weakness.  No confusion.  No loss of bladder or bowel control.  States it is just a pain control issue.  She is on anticoagulation however for atrial fibrillation. Past Medical History:  Diagnosis Date  . A-fib ([place])   . Chronic kidney disease   . Congestive heart failure (CHF) ([place]) 05/2016  . Crohn's disease ([place])   . Dysrhythmia    afib dx approx. 2016 per pt  . Hip fracture ([place])   . Hypertension     Patient Active Problem List   Diagnosis Date Noted  . Medication management 03/06/2019  . Centrilobular emphysema ([place]) 02/16/2019  . Lung nodule, multiple 02/16/2019  . Bronchiectasis without complication ([place]) 81/27/5170  . Chronic combined systolic and diastolic heart failure ([place]) 03/29/2018  . Elevated troponin 03/29/2018  . CKD (chronic kidney disease), stage III 03/29/2018  . Anemia 03/29/2018  . Malnutrition of moderate degree 07/26/2016  . Closed right hip fracture, initial encounter ([place]) 07/24/2016  . Hypertension 07/24/2016  . A-fib ([place]) 07/24/2016    Past Surgical History:  Procedure Laterality Date  . CARDIOVERSION N/A 02/04/2019   Procedure: CARDIOVERSION;  Surgeon: Buford Dresser, MD;  Location: [facility] ENDOSCOPY;  Service: Cardiovascular;  Laterality: N/A;  . FEMUR IM NAIL Right 07/26/2016   Procedure: INTRAMEDULLARY (IM) NAIL  FEMORAL;  Surgeon: Renette Butters, MD;  Location: [place];  Service: Orthopedics;  Laterality: Right;  . FRACTURE SURGERY    . large intestine - partial removal  approx 1993   due to crohns     OB History   No obstetric history on file.     Family History  Problem Relation Age of Onset  . Lung cancer Mother   . Alzheimer's disease Father   . Diabetes Maternal Grandmother     Social History   Tobacco Use  . Smoking status: Former Smoker    Packs/day: 0.50    Years: 30.00    Pack years: 15.00    Types: Cigarettes    Start date: 29    Quit date: 1980    Years since quitting: 40.9  . Smokeless tobacco: Never Used  Substance Use Topics  . Alcohol use: No  . Drug use: No    Home Medications Prior to Admission medications   Medication Sig Start Date End Date Taking? Authorizing Provider  acetaminophen (TYLENOL) 500 MG tablet Take 1,000 mg by mouth daily as needed for moderate pain or headache.    [provider]  albuterol (VENTOLIN HFA) 108 (90 Base) MCG/ACT inhaler INHALE 2 PUFFS INTO THE LUNGS EVERY 6 HOURS AS NEEDED FOR WHEEZING OR SHORTNESS OF BREATH 04/21/19   Margaretha Seeds, MD  alendronate (FOSAMAX) 70 MG tablet Take 70 mg by mouth every Saturday.  Take with a full glass of water on an empty stomach.    [provider]  amiodarone (PACERONE) 200 MG tablet Take 1 tablet (200 mg total) by mouth daily. 02/10/19   Fenton, Clint R, PA  carvedilol (COREG) 12.5 MG tablet Take 12.5 mg by mouth 2 (two) times daily with a meal.    [provider]  Cholecalciferol (VITAMIN D3) 2000 units TABS Take 2,000 Units by mouth daily.     [provider]  clonazePAM (KLONOPIN) 0.5 MG tablet Take 0.5 mg by mouth 2 (two) times daily.    [provider]  ELIQUIS 2.5 MG TABS tablet TAKE 1 TABLET TWICE DAILY 06/22/19   Burnell Blanks, MD  Ferrous Sulfate (IRON) 28 MG TABS Take 28 mg by mouth daily.     [provider]  furosemide  (LASIX) 20 MG tablet Take 20 mg by mouth daily as needed for edema.     [provider]  hydrALAZINE (APRESOLINE) 25 MG tablet Take 25 mg by mouth 2 (two) times daily.     [provider]  HYDROcodone-acetaminophen (NORCO) 5-325 MG tablet Take 1-2 tablets by mouth every 6 (six) hours as needed. 07/04/19   Davonna Belling, MD  latanoprost (XALATAN) 0.005 % ophthalmic solution Place 1 drop into both eyes at bedtime.    [provider]  loperamide (IMODIUM) 2 MG capsule Take 4 mg by mouth 4 (four) times daily as needed for diarrhea or loose stools.    [provider]  mirtazapine (REMERON) 15 MG tablet Take 15 mg by mouth at bedtime.     [provider]  potassium chloride (K-DUR) 10 MEQ tablet Take 10 mEq by mouth daily as needed (when taking lasix).     [provider]  umeclidinium-vilanterol (ANORO ELLIPTA) 62.5-25 MCG/INH AEPB Inhale 1 puff into the lungs daily. 03/13/19   Lauraine Rinne, NP  venlafaxine XR (EFFEXOR-XR) 150 MG 24 hr capsule Take 150 mg by mouth 2 (two) times daily.    [provider]    Allergies    Penicillins  Review of Systems   Review of Systems  Constitutional: Negative for appetite change.  HENT: Negative for congestion.   Respiratory: Negative for shortness of breath.   Cardiovascular: Negative for chest pain.  Gastrointestinal: Negative for abdominal pain.  Musculoskeletal: Positive for back pain.  Skin: Negative for rash.  Neurological: Negative for weakness and numbness.    Physical Exam Updated Vital Signs BP (!) 174/74   Pulse 60   Temp 98 F (36.7 C)   Resp 16   Ht 5' (1.524 m)   Wt 44.9 kg   SpO2 97%   BMI 19.33 kg/m   Physical Exam Vitals and nursing note reviewed.  HENT:     Head: Atraumatic.  Eyes:     Extraocular Movements: Extraocular movements intact.  Cardiovascular:     Rate and Rhythm: Regular rhythm.  Pulmonary:     Breath sounds: No wheezing or rhonchi.    Abdominal:     Tenderness: There is no abdominal tenderness.  Musculoskeletal:     Cervical back: Neck supple.     Comments: Kyphosis.  Tenderness over lower thoracic spine.  No rash.  No deformity.  No step-off.  Sensation and strength intact in bilateral lower extremities.  Skin:    General: Skin is warm.     Capillary Refill: Capillary refill takes less than 2 seconds.  Neurological:     Mental Status: She is  alert. Mental status is at baseline.     ED Results / Procedures / Treatments   Labs (all labs ordered are listed, but only abnormal results are displayed) Labs Reviewed - No data to display  EKG None  Radiology DG Thoracic Spine W/Swimmers  Result Date: 07/04/2019 CLINICAL DATA:  Pt slipped on a step 1 week ago and caught herself by pulling on a pole on her right side; pt then 2 days later was moving a mattress on the bed and had upper back and right rib pain EXAM: THORACIC SPINE - 3 VIEWS COMPARISON:  CT chest 05/06/2019 FINDINGS: Mild scoliotic curvature. Alignment appears intact. There is new mild wedging of a midthoracic vertebral body which could represent compression fracture. Additional wedge compression deformities in the upper and lower thoracic spine are stable compared to prior CT. Calcification of the thoracic and abdominal aorta noted. No acute finding in visualized portions of the lungs. IMPRESSION: 1. Mild wedging of a midthoracic vertebral body,, new from October 2020 CT, which could represent compression fracture. 2. Stable additional wedge compression deformities in the upper and lower thoracic spine. Electronically Signed   By: Audie Pinto M.D.   On: 07/04/2019 10:34    Procedures Procedures (including critical care time)  Medications Ordered in ED Medications - No data to display  ED Course  I have reviewed the triage vital signs and the nursing notes.  Pertinent labs & imaging results that were available during my care of the patient were  reviewed by me and considered in my medical decision making (see chart for details).    MDM Rules/Calculators/A&P     CHA2DS2/VAS Stroke Risk Points  Current as of 13 minutes ago     6 >= 2 Points: High Risk  1 - 1.99 Points: Medium Risk  0 Points: Low Risk    This is the only CHA2DS2/VAS Stroke Risk Points available for the past  year.: Last Change: N/A     Details    This score determines the patient's risk of having a stroke if the  patient has atrial fibrillation.       Points Metrics  1 Has Congestive Heart Failure:  Yes    Current as of 13 minutes ago  1 Has Vascular Disease:  Yes    Current as of 13 minutes ago  1 Has Hypertension:  Yes    Current as of 13 minutes ago  2 Age:  45    Current as of 13 minutes ago  0 Has Diabetes:  No    Current as of 13 minutes ago  0 Had Stroke:  No  Had TIA:  No  Had thromboembolism:  No    Current as of 13 minutes ago  1 Female:  Yes    Current as of 13 minutes ago                         Patient presents with mid back pain.  Began when she felt a pop in her back.  Mid back tenderness.  Had outpatient x-ray done that showed some compression fractures.  Thoracic x-ray done here showed new compression fracture.  I think this is the cause of the pain.  Doubt severe impingement at this time.  She is however on anticoagulation so does have a higher worry of other complicating factors such as epidural bleed.  She has follow-up with orthopedic surgery in 2 days and I feel at this point she  is stable to follow-up with them.  Her CT scanner is down at this time.  I think it is okay to delay further imaging until after follow-up.  Doubt cord impingement at this time or other severe pathology choir transfer to be able to get a higher level of imaging. Final Clinical Impression(s) / ED Diagnoses Final diagnoses:  Compression fracture of thoracic vertebra, initial encounter, unspecified thoracic vertebral level ([place])    Rx / DC Orders ED  Discharge Orders         Ordered    HYDROcodone-acetaminophen (NORCO) 5-325 MG tablet  Every 6 hours PRN     07/04/19 1055           Davonna Belling, MD 07/04/19 1104

## 2019-07-07 ENCOUNTER — Ambulatory Visit (INDEPENDENT_AMBULATORY_CARE_PROVIDER_SITE_OTHER): Payer: Medicare Other | Admitting: Pulmonary Disease

## 2019-07-07 ENCOUNTER — Encounter: Payer: Self-pay | Admitting: Pulmonary Disease

## 2019-07-07 ENCOUNTER — Other Ambulatory Visit: Payer: Self-pay

## 2019-07-07 DIAGNOSIS — J479 Bronchiectasis, uncomplicated: Secondary | ICD-10-CM

## 2019-07-07 DIAGNOSIS — I4891 Unspecified atrial fibrillation: Secondary | ICD-10-CM

## 2019-07-07 DIAGNOSIS — Z79899 Other long term (current) drug therapy: Secondary | ICD-10-CM | POA: Diagnosis not present

## 2019-07-07 DIAGNOSIS — J432 Centrilobular emphysema: Secondary | ICD-10-CM

## 2019-07-07 DIAGNOSIS — Z7189 Other specified counseling: Secondary | ICD-10-CM | POA: Insufficient documentation

## 2019-07-07 MED ORDER — ANORO ELLIPTA 62.5-25 MCG/INH IN AEPB: 1.0000 | INHALATION_SPRAY | Freq: Every day | RESPIRATORY_TRACT | 0 refills | Status: DC

## 2019-07-07 NOTE — Assessment & Plan Note (Signed)
Plan: We will continue to monitor this clinically No acute worsening cough or increased congestion today

## 2019-07-07 NOTE — Assessment & Plan Note (Signed)
Plan: Continue Eliquis as prescribed Continue to follow-up with cardiology

## 2019-07-07 NOTE — Assessment & Plan Note (Signed)
Patient reporting her insurance will no longer cover nor Ellipta I suspect this is the formulary change where now Stiolto Respimat is favored on [employer] plans Patient will need to be counseled on Respimat device use  Plan: Samples of Anoro Ellipta provided today Schedule patient with clinical pharmacy team for appointment on Respimat device use as well as review of 2021 formulary Explained to patient's daughter and patient that this appointment will need to be delayed if in fact patient spouse is positive for Covid

## 2019-07-07 NOTE — Assessment & Plan Note (Signed)
Plan: Continue Anoro Ellipta Patient's husband currently symptomatic, I would recommend the patient also received testing if he is tested for Covid Continue rescue inhaler as needed Patient's insurance will no longer cover Anoro Ellipta for calendar year 2021, patient will likely need to switch to [employer] Respimat

## 2019-07-07 NOTE — Progress Notes (Signed)
@Patient  ID: Melissa Mcdaniel, female    DOB: [birth year], 85 y.o.   MRN: [id number]  No chief complaint on file.   Referring provider: Harlan Stains, MD  HPI:  85 year old female former smoker followed in our office for emphysema  PMH:  chronic diastolic heart failure, atrial fibrillation, hypertension, CKD, depression and recent pneumonia who presents for follow-up. Smoker/ Smoking History: Former smoker.  15-pack-year smoking history. Maintenance:  Anoro Ellipta  Pt of: Dr. Loanne Drilling  07/07/2019  - Visit   Follow up  stiolto resp   Husband - 103.8 Fever symptoms  Fatigued   Needs new inhaler  Pharm 2 weeks    Tests:   07/24/2018-CT chest without contrast- Increased reticular nodular densities are noted in the right lower lobe most consistent with atypical inflammation or infection some bronchitis and mucous plugging may be present as well, stable reticular densities are noted in the right middle lobe and left lower lobe which may represent scarring or sequela from previous atypical inflammation, 6 mm nodules noted in the right upper lobe which is significantly enlarged compared to prior exam, also noted a stable 6 mm nodule in left upper lobe, noncontrast chest CT at 3 to 6 months is recommended  02/09/2019-CT chest without contrast- no acute cardiopulmonary disease, chronic bilateral patchy reticular nodular pattern of opacification with areas of discrete nodularity and mild bronchiectatic change over the right lower lobe, findings not significantly changed from 02/05/2018, slightly improved overall compared to 07/24/2018, findings likely due to chronic inflammatory or atypical infectious process,  03/31/2018-echocardiogram- LV ejection fraction 55 to 60%, PA pressure 35  FENO:  No results found for: NITRICOXIDE  PFT: No flowsheet data found.  WALK:  SIX MIN WALK 03/06/2019 02/16/2019  Supplimental Oxygen during Test? (L/min) No No  Tech Comments: Patient was able to  complete walk. She walked at a slow pace due to having hip pain. Declined having any chest pain. No O2 was not needed during or after walk. patient stopped during lap 2 to catch breath. sats dropped to 90 but came back up with some breathing. Patient's legs were hurting only completed 2 laps. patient was slightly dizzy and short of breath toward end of lap 2. patient walked and a moderate pace    Imaging: DG Thoracic Spine W/Swimmers  Result Date: 07/04/2019 CLINICAL DATA:  Pt slipped on a step 1 week ago and caught herself by pulling on a pole on her right side; pt then 2 days later was moving a mattress on the bed and had upper back and right rib pain EXAM: THORACIC SPINE - 3 VIEWS COMPARISON:  CT chest 05/06/2019 FINDINGS: Mild scoliotic curvature. Alignment appears intact. There is new mild wedging of a midthoracic vertebral body which could represent compression fracture. Additional wedge compression deformities in the upper and lower thoracic spine are stable compared to prior CT. Calcification of the thoracic and abdominal aorta noted. No acute finding in visualized portions of the lungs. IMPRESSION: 1. Mild wedging of a midthoracic vertebral body,, new from October 2020 CT, which could represent compression fracture. 2. Stable additional wedge compression deformities in the upper and lower thoracic spine. Electronically Signed   By: Audie Pinto M.D.   On: 07/04/2019 10:34    Lab Results:  CBC    Component Value Date/Time   WBC 5.0 05/06/2019 1240   RBC 3.29 (L) 05/06/2019 1240   HGB 9.4 (L) 05/06/2019 1240   HGB 10.0 (L) 01/30/2019 1351   HCT 30.1 (L) 05/06/2019 1240  HCT 31.5 (L) 01/30/2019 1351   PLT 209 05/06/2019 1240   PLT 243 01/30/2019 1351   MCV 91.5 05/06/2019 1240   MCV 86 01/30/2019 1351   MCH 28.6 05/06/2019 1240   MCHC 31.2 05/06/2019 1240   RDW 14.2 05/06/2019 1240   RDW 13.0 01/30/2019 1351   LYMPHSABS 0.8 04/15/2019 1545   LYMPHSABS 0.8 01/30/2019 1351    MONOABS 0.5 04/15/2019 1545   EOSABS 0.2 04/15/2019 1545   EOSABS 0.1 01/30/2019 1351   BASOSABS 0.1 04/15/2019 1545   BASOSABS 0.1 01/30/2019 1351    BMET    Component Value Date/Time   NA 138 05/06/2019 1240   NA 137 01/30/2019 1351   K 5.7 (H) 05/06/2019 1240   CL 108 05/06/2019 1240   CO2 21 (L) 05/06/2019 1240   GLUCOSE 95 05/06/2019 1240   BUN 25 (H) 05/06/2019 1240   BUN 23 01/30/2019 1351   CREATININE 1.87 (H) 05/06/2019 1240   CALCIUM 8.1 (L) 05/06/2019 1240   GFRNONAA 24 (L) 05/06/2019 1240   GFRAA 28 (L) 05/06/2019 1240    BNP    Component Value Date/Time   BNP 367.9 (H) 03/29/2018 0627    ProBNP    Component Value Date/Time   PROBNP 270.0 (H) 04/15/2019 1545    Specialty Problems      Pulmonary Problems   Bronchiectasis without complication (HCC)   Centrilobular emphysema (HCC)   Lung nodule, multiple      Allergies  Allergen Reactions  . Penicillins Swelling    Did it involve swelling of the face/tongue/throat, SOB, or low BP? Yes Did it involve sudden or severe rash/hives, skin peeling, or any reaction on the inside of your mouth or nose? No Did you need to seek medical attention at a hospital or doctor's office? No When did it last happen?childhood allergy If all above answers are "NO", may proceed with cephalosporin use.      There is no immunization history on file for this patient.  Past Medical History:  Diagnosis Date  . A-fib ([place])   . Chronic kidney disease   . Congestive heart failure (CHF) ([place]) 05/2016  . Crohn's disease ([place])   . Dysrhythmia    afib dx approx. 2016 per pt  . Hip fracture ([place])   . Hypertension     Tobacco History: Social History   Tobacco Use  Smoking Status Former Smoker  . Packs/day: 0.50  . Years: 30.00  . Pack years: 15.00  . Types: Cigarettes  . Start date: 30  . Quit date: 13  . Years since quitting: 40.9  Smokeless Tobacco Never Used   Counseling given: Not  Answered   Continue to not smoke  Outpatient Encounter Medications as of 07/07/2019  Medication Sig  . acetaminophen (TYLENOL) 500 MG tablet Take 1,000 mg by mouth daily as needed for moderate pain or headache.  . albuterol (VENTOLIN HFA) 108 (90 Base) MCG/ACT inhaler INHALE 2 PUFFS INTO THE LUNGS EVERY 6 HOURS AS NEEDED FOR WHEEZING OR SHORTNESS OF BREATH  . alendronate (FOSAMAX) 70 MG tablet Take 70 mg by mouth every Saturday. Take with a full glass of water on an empty stomach.  Marland Kitchen amiodarone (PACERONE) 200 MG tablet Take 1 tablet (200 mg total) by mouth daily.  . carvedilol (COREG) 12.5 MG tablet Take 12.5 mg by mouth 2 (two) times daily with a meal.  . Cholecalciferol (VITAMIN D3) 2000 units TABS Take 2,000 Units by mouth daily.   . clonazePAM (KLONOPIN) 0.5  MG tablet Take 0.5 mg by mouth 2 (two) times daily.  Marland Kitchen ELIQUIS 2.5 MG TABS tablet TAKE 1 TABLET TWICE DAILY  . Ferrous Sulfate (IRON) 28 MG TABS Take 28 mg by mouth daily.   . furosemide (LASIX) 20 MG tablet Take 20 mg by mouth daily as needed for edema.   . hydrALAZINE (APRESOLINE) 25 MG tablet Take 25 mg by mouth 2 (two) times daily.   Marland Kitchen HYDROcodone-acetaminophen (NORCO) 5-325 MG tablet Take 1-2 tablets by mouth every 6 (six) hours as needed.  . latanoprost (XALATAN) 0.005 % ophthalmic solution Place 1 drop into both eyes at bedtime.  Marland Kitchen loperamide (IMODIUM) 2 MG capsule Take 4 mg by mouth 4 (four) times daily as needed for diarrhea or loose stools.  . mirtazapine (REMERON) 15 MG tablet Take 15 mg by mouth at bedtime.   . potassium chloride (K-DUR) 10 MEQ tablet Take 10 mEq by mouth daily as needed (when taking lasix).   Marland Kitchen umeclidinium-vilanterol (ANORO ELLIPTA) 62.5-25 MCG/INH AEPB Inhale 1 puff into the lungs daily.  Marland Kitchen umeclidinium-vilanterol (ANORO ELLIPTA) 62.5-25 MCG/INH AEPB Inhale 1 puff into the lungs daily.  Marland Kitchen venlafaxine XR (EFFEXOR-XR) 150 MG 24 hr capsule Take 150 mg by mouth 2 (two) times daily.   No  facility-administered encounter medications on file as of 07/07/2019.    Review of Systems  Review of Systems  Constitutional: Negative for activity change, fatigue and fever.  HENT: Negative for sinus pressure, sinus pain and sore throat.   Respiratory: Positive for shortness of breath. Negative for cough and wheezing.   Cardiovascular: Negative for chest pain and palpitations.  Musculoskeletal: Positive for back pain. Negative for arthralgias.       Currently working with [facility] for ongoing back pain  Neurological: Negative for dizziness.  Psychiatric/Behavioral: Negative for sleep disturbance. The patient is not nervous/anxious.      Physical Exam  Deferred due to televisit   Assessment & Plan:   A-fib [facility]) Plan: Continue Eliquis as prescribed Continue to follow-up with cardiology  Bronchiectasis without complication ([place]) Plan: We will continue to monitor this clinically No acute worsening cough or increased congestion today  Centrilobular emphysema (HCC) Plan: Continue Anoro Ellipta Patient's husband currently symptomatic, I would recommend the patient also received testing if he is tested for Covid Continue rescue inhaler as needed Patient's insurance will no longer cover Anoro Ellipta for calendar year 2021, patient will likely need to switch to [employer] Respimat  Advice given about COVID-19 virus infection Patient spouse who she lives with currently symptomatic with a temperature of 103.8 Patient spouse also has body aches  Plan: We recommend the patient spouse obtain outpatient Covid testing Educated patient's daughter on how to schedule outpatient Covid testing in [place] system Educated patient's daughter regarding monoclonal antibody infusions If patient spouse is being tested I would recommend the patient herself obtain testing If the patient did become symptomatic I would recommend that she receive the monoclonal antibody  infusion  Counseled the patient's daughter as well as the patient that if the patient spouse is positive then we will need to delay the appointment of her coming in to see the clinical pharmacy team  Additional samples of Anoro Ellipta provided for the patient today  Medication management Patient reporting her insurance will no longer cover nor Ellipta I suspect this is the formulary change where now Stiolto Respimat is favored on [employer] plans Patient will need to be counseled on Respimat device use  Plan: Samples  of Anoro Ellipta provided today Schedule patient with clinical pharmacy team for appointment on Respimat device use as well as review of 2021 formulary Explained to patient's daughter and patient that this appointment will need to be delayed if in fact patient spouse is positive for Covid   Return in about 4 weeks (around 08/04/2019), or if symptoms worsen or fail to improve.  I provided 28 minutes of non-face-to-face time during this encounter.   Lauraine Rinne, NP

## 2019-07-07 NOTE — Assessment & Plan Note (Signed)
Patient spouse who she lives with currently symptomatic with a temperature of 103.8 Patient spouse also has body aches  Plan: We recommend the patient spouse obtain outpatient Covid testing Educated patient's daughter on how to schedule outpatient Covid testing in [place] system Educated patient's daughter regarding monoclonal antibody infusions If patient spouse is being tested I would recommend the patient herself obtain testing If the patient did become symptomatic I would recommend that she receive the monoclonal antibody infusion  Counseled the patient's daughter as well as the patient that if the patient spouse is positive then we will need to delay the appointment of her coming in to see the clinical pharmacy team  Additional samples of Anoro Ellipta provided for the patient today

## 2019-07-07 NOTE — Patient Instructions (Addendum)
You were seen today by Lauraine Rinne, NP  for:   1. Centrilobular emphysema (HCC)  Anoro Ellipta  >>> Take 1 puff daily in the morning right when you wake up >>>Rinse your mouth out after use >>>This is a daily maintenance inhaler, NOT a rescue inhaler >>>Contact our office if you are having difficulties affording or obtaining this medication >>>It is important for you to be able to take this daily and not miss any doses   Only use your albuterol as a rescue medication to be used if you can't catch your breath by resting or doing a relaxed purse lip breathing pattern.  - The less you use it, the better it will work when you need it. - Ok to use up to 2 puffs  every 4 hours if you must but call for immediate appointment if use goes up over your usual need - Don't leave home without it !!  (think of it like the spare tire for your car)    Note your daily symptoms > remember "red flags" for COPD:   >>>Increase in cough >>>increase in sputum production >>>increase in shortness of breath or activity  intolerance.   If you notice these symptoms, please call the office to be seen.    2. Bronchiectasis without complication ([place])  3. Medication management  We have provided samples of Anoro Ellipta today  We have scheduled you for an appointment with the clinical pharmacy team  Please present to our office in 2 weeks for an appointment with the clinical pharmacy team for:  Marland Kitchen Medication Management -Anoro is no longer covered, patient likely switching to [employer] Respimat . Medication reconciliation  . Medication Access  . Inhaler teaching -Respimat device   4. Advice given about COVID-19 virus infection  With your spouse's symptoms I would recommend that he gets tested for SARS-CoV-2 It is reasonable if you have symptoms or if you have concerns regarding his symptoms that you can also obtain testing   To Schedule COVID19 testing:   Please have the patient text "COVID" to [phone]         OR      log onto the Internet and access [email or web address] to make an online appointment for Covid testing  If you are unable to text or unable to access the website to make an online appointment then you can contact [phone] to get assistance with scheduling a Covid test over the phone.  Please contact our office if they are unable to get you scheduled for a Covid test within a timely manner.   COVID Testing Site Locations (For sick patients only, pre-procedure is done differently)  . [place] [address], [place], [place] [address] . [place]  (on [employer])  o Nature conservation officer  . [place] [facility], [facility] and [facility] will be open from 10 a.m. - 3 p.m.      We recommend today:  No orders of the defined types were placed in this encounter.  No orders of the defined types were placed in this encounter.  Meds ordered this encounter  Medications  . umeclidinium-vilanterol (ANORO ELLIPTA) 62.5-25 MCG/INH AEPB    Sig: Inhale 1 puff into the lungs daily.    Dispense:  2 each    Refill:  0    Order Specific Question:   Lot Number?  Answer:   [id number]    Order Specific Question:   Expiration Date?    Answer:   12/20/2020    Order Specific Question:   Manufacturer?    Answer:   [employer] [[id number]]    Follow Up:    No follow-ups on file.   Please do your part to reduce the spread of COVID-19:      Reduce your risk of any infection  and COVID19 by using the similar precautions used for avoiding the common cold or flu:  Marland Kitchen Wash your hands often with soap and warm water for at least 20 seconds.  If soap and water are not readily available, use an alcohol-based hand sanitizer with at least 60% alcohol.  . If coughing or sneezing, cover your mouth and nose by coughing or sneezing into the elbow areas of your shirt or coat, into a tissue or  into your sleeve (not your hands). Langley Gauss A MASK when in public  . Avoid shaking hands with others and consider head nods or verbal greetings only. . Avoid touching your eyes, nose, or mouth with unwashed hands.  . Avoid close contact with people who are sick. . Avoid places or events with large numbers of people in one location, like concerts or sporting events. . If you have some symptoms but not all symptoms, continue to monitor at home and seek medical attention if your symptoms worsen. . If you are having a medical emergency, call 911.   [place] / e-Visit: [email or web address]         [facility]: [facility]: [phone]                   [facility]: [phone]     It is flu season:   >>> Best ways to protect herself from the flu: Receive the yearly flu vaccine, practice good hand hygiene washing with soap and also using hand sanitizer when available, eat a nutritious meals, get adequate rest, hydrate appropriately   Please contact the office if your symptoms worsen or you have concerns that you are not improving.   Thank you for choosing [facility] for your healthcare, and for allowing [place] to partner with you on your healthcare journey. I am thankful to be able to provide care to you today.   Wyn Quaker FNP-C

## 2019-07-20 ENCOUNTER — Ambulatory Visit: Payer: Medicare Other

## 2019-07-22 ENCOUNTER — Ambulatory Visit: Payer: Medicare Other

## 2019-07-22 ENCOUNTER — Telehealth: Payer: Self-pay | Admitting: Pulmonary Disease

## 2019-07-22 NOTE — Telephone Encounter (Signed)
Returned patient's call. She wanted to remind [place] that Anoro Ellipta will not be covered under her new insurance.  Also that she had side effects to [facility].  Advised that we have her adverse event documented in her chart and we will not switch her to any inhalers with a steroid in it which likely contributed to her symptoms.  Patient verbalized understanding.  Patient has no further questions at this time.  Mariella Saa, PharmD, [place], [place] Clinical Specialty Pharmacist [phone]  07/22/2019 8:47 AM

## 2019-07-27 ENCOUNTER — Telehealth: Payer: Self-pay | Admitting: Pulmonary Disease

## 2019-07-27 NOTE — Telephone Encounter (Signed)
07/27/2019 1425  We have received a formulary alternative list from patient's insurance company [employer].  Anoro Ellipta is no longer covered.  We will need to start the patient on Stiolto Respimat.  Stiolto Respimat inhaler >>>2 puffs daily >>>Take this no matter what >>>This is not a rescue inhaler  Okay to send in prescription.  Please also ensure that patient is scheduled with the clinical pharmacy team for inhaler device teaching for her new Respimat device.  Wyn Quaker, FNP

## 2019-07-27 NOTE — Telephone Encounter (Signed)
We will route this to you guys as FYI.  I would prefer the patient be started on Stiolto Respimat as I favor this over [place].  Looks like this is a approved option for her insurance.  Thank you for seeing her.  You are seeing her later on this week.Wyn Quaker, FNP

## 2019-07-27 NOTE — Telephone Encounter (Signed)
Patient is scheduled for a pharmacy visit on January 8th.

## 2019-07-28 NOTE — Progress Notes (Signed)
 This encounter was created in error - please disregard.

## 2019-07-30 ENCOUNTER — Telehealth: Payer: Self-pay | Admitting: Pulmonary Disease

## 2019-07-30 MED ORDER — ANORO ELLIPTA 62.5-25 MCG/INH IN AEPB: 1.0000 | INHALATION_SPRAY | Freq: Every day | RESPIRATORY_TRACT | 0 refills | Status: DC

## 2019-07-30 NOTE — Telephone Encounter (Signed)
Spoke with patient.  Let her know due to weather we wanted reschedule the pharmacy appointment for  07/31/19. Patient agreed to change in schedule will come Wednesday 08/05/19.  Patient is out of inhaler Anoro and so sample placed up front for patient to pick up to get her to the pharmacy appointment on 08/05/19  Will route to pharm team as [facility]

## 2019-08-03 ENCOUNTER — Other Ambulatory Visit: Payer: Self-pay | Admitting: *Deleted

## 2019-08-03 MED ORDER — APIXABAN 2.5 MG PO TABS: 2.5000 mg | ORAL_TABLET | Freq: Two times a day (BID) | ORAL | 2 refills | Status: AC

## 2019-08-03 NOTE — Telephone Encounter (Signed)
Eliquis 2.28m refill request received, pt is 86yrold, weight-44.9kg, Crea-1.87 on 05/06/2019, Diagnosis-Afib, and last seen by Dr. McAngelena Formn 06/26/2019. Dose is appropriate based on dosing criteria. Will send in refill to requested pharmacy.

## 2019-08-05 ENCOUNTER — Ambulatory Visit (INDEPENDENT_AMBULATORY_CARE_PROVIDER_SITE_OTHER): Payer: Medicare Other | Admitting: Pharmacist

## 2019-08-05 ENCOUNTER — Other Ambulatory Visit: Payer: Self-pay

## 2019-08-05 DIAGNOSIS — J432 Centrilobular emphysema: Secondary | ICD-10-CM

## 2019-08-05 DIAGNOSIS — Z79899 Other long term (current) drug therapy: Secondary | ICD-10-CM | POA: Diagnosis not present

## 2019-08-05 DIAGNOSIS — J449 Chronic obstructive pulmonary disease, unspecified: Secondary | ICD-10-CM

## 2019-08-05 MED ORDER — STIOLTO RESPIMAT 2.5-2.5 MCG/ACT IN AERS: 2.0000 | INHALATION_SPRAY | Freq: Every day | RESPIRATORY_TRACT | 0 refills | Status: DC

## 2019-08-05 NOTE — Progress Notes (Signed)
Subjective  Patient presents today to [facility] to see pharmacy team for inhaler optimization.  Past medical history includes COPD, history of tobacco abuse, CHF, atrial fibrillation, HTN, CKD, and depression.  She is a patient of Dr. Loanne Drilling last seen in office by Wyn Quaker, FNP on 07/07/2019.  At last visit patient brought in paperwork stating that a Anoro Ellipta will no longer be covered under her current plan in 2021.     Objective Allergies  Allergen Reactions  . Penicillins Swelling    Did it involve swelling of the face/tongue/throat, SOB, or low BP? Yes Did it involve sudden or severe rash/hives, skin peeling, or any reaction on the inside of your mouth or nose? No Did you need to seek medical attention at a hospital or doctor's office? No When did it last happen?childhood allergy If all above answers are "NO", may proceed with cephalosporin use.     Outpatient Encounter Medications as of 08/05/2019  Medication Sig  . acetaminophen (TYLENOL) 500 MG tablet Take 1,000 mg by mouth daily as needed for moderate pain or headache.  . albuterol (VENTOLIN HFA) 108 (90 Base) MCG/ACT inhaler INHALE 2 PUFFS INTO THE LUNGS EVERY 6 HOURS AS NEEDED FOR WHEEZING OR SHORTNESS OF BREATH  . alendronate (FOSAMAX) 70 MG tablet Take 70 mg by mouth every Saturday. Take with a full glass of water on an empty stomach.  Marland Kitchen amiodarone (PACERONE) 200 MG tablet Take 1 tablet (200 mg total) by mouth daily.  Marland Kitchen apixaban (ELIQUIS) 2.5 MG TABS tablet Take 1 tablet (2.5 mg total) by mouth 2 (two) times daily.  . carvedilol (COREG) 12.5 MG tablet Take 12.5 mg by mouth 2 (two) times daily with a meal.  . Cholecalciferol (VITAMIN D3) 2000 units TABS Take 2,000 Units by mouth daily.   . clonazePAM (KLONOPIN) 0.5 MG tablet Take 0.5 mg by mouth 2 (two) times daily.  . Ferrous Sulfate (IRON) 28 MG TABS Take 28 mg by mouth daily.   . furosemide (LASIX) 20 MG tablet Take 20 mg by mouth daily as needed for  edema.   . hydrALAZINE (APRESOLINE) 25 MG tablet Take 25 mg by mouth 2 (two) times daily.   Marland Kitchen HYDROcodone-acetaminophen (NORCO) 5-325 MG tablet Take 1-2 tablets by mouth every 6 (six) hours as needed.  . latanoprost (XALATAN) 0.005 % ophthalmic solution Place 1 drop into both eyes at bedtime.  Marland Kitchen loperamide (IMODIUM) 2 MG capsule Take 4 mg by mouth 4 (four) times daily as needed for diarrhea or loose stools.  . mirtazapine (REMERON) 15 MG tablet Take 15 mg by mouth at bedtime.   . potassium chloride (K-DUR) 10 MEQ tablet Take 10 mEq by mouth daily as needed (when taking lasix).   Marland Kitchen umeclidinium-vilanterol (ANORO ELLIPTA) 62.5-25 MCG/INH AEPB Inhale 1 puff into the lungs daily.  Marland Kitchen umeclidinium-vilanterol (ANORO ELLIPTA) 62.5-25 MCG/INH AEPB Inhale 1 puff into the lungs daily.  Marland Kitchen umeclidinium-vilanterol (ANORO ELLIPTA) 62.5-25 MCG/INH AEPB Inhale 1 puff into the lungs daily.  Marland Kitchen venlafaxine XR (EFFEXOR-XR) 150 MG 24 hr capsule Take 150 mg by mouth 2 (two) times daily.   No facility-administered encounter medications on file as of 08/05/2019.      There is no immunization history on file for this patient.   Assessment and Plan  1. Inhaler Optimization  Patient is currently on Anoro Ellipta.  She was informed that her insurance would no longer cover the inhaler.  She was switched from [facility] to [employer] in August  2020 due to dizziness.  She noticed an improvement in her symptoms after starting Anoro and they are currently stable.  Concerned that patient is not a good candidate for DPI inhaler based on inspiratory force.  Inspiratory flow measured using the in-check dial G 16 and was not in range of 30-90 for use of Anoro Ellipta device.  Patient scored 10.  Measured inspiratory flow again for Respimat device and patient was in range and scored 20.  Recommend switching to Stiolto Respimat for greater efficacy.  Stiolto Respimat is covered under insurance but co-pay will be $460 due to  $445 deductible.   Patient will apply for patient assistance through manufacturer BI.  She was given paperwork in office and will return completed application along with income documents.  We will update patient when we receive a response.  Patient was counseled on the purpose, proper use, and adverse effects of Stiolto Respimat inhaler. Reviewed appropriate use of maintenance vs rescue inhalers.  Stressed importance of using maintenance inhaler daily and rescue inhaler only as needed.  Patient verbalized understanding.  Demonstrated proper inhaler technique using Stiolto demo inhaler.  Patient able to demonstrate proper inhaler technique using teach back method. Patient was given sample in office today.   2. Immunizations No immunization history on file.  Patient is eligible for the influenzae, pneumonia, and shingles vaccinations.  Unable to address at this appointment due to time.  Will address at next appointment.  All questions encouraged and answered.  Instructed patient to call with any other questions or concerns.  Thank you for allowing the pharmacy to participate in this patient's care.  This appointment required  60 minutes of patient care (this includes precharting, chart review, review of results, face-to-face care, etc.).   Mariella Saa, PharmD, [place], [place] Clinical Specialty Pharmacist [phone]  08/05/2019 8:52 PM

## 2019-08-10 ENCOUNTER — Telehealth: Payer: Self-pay | Admitting: Pulmonary Disease

## 2019-08-10 DIAGNOSIS — M546 Pain in thoracic spine: Secondary | ICD-10-CM | POA: Diagnosis not present

## 2019-08-10 NOTE — Telephone Encounter (Signed)
Spoke with pt's husband, [name]. He is aware of Brian's response.  Will route message to the pharmacy team to get pt scheduled.

## 2019-08-10 NOTE — Telephone Encounter (Signed)
Will route to [facility] NP , as changed by him recently 08/05/19 and OV on 12/15

## 2019-08-10 NOTE — Telephone Encounter (Signed)
Spoke with pt's husband, [name]. States that she had a reaction to [employer]. He reports that the pt was having severe shortness of breath. Pt stopped taking Stiolto on 08/08/2019 and her issue stopped completely. Pt's husband would like to know if she needs to have the Stiolto replaced with something else or if she can go without a daily inhaler.  Tammy - please advise. Thanks.

## 2019-08-10 NOTE — Telephone Encounter (Signed)
Okay to stop Stiolto Respimat at this time.Please have the patient come back in to see clinical pharmacy team.  They were last seen by Amber.  To further evaluate inhaler use.  We may need to consider nebulized medications.   Can they come in to be seen on Wednesday of this week?  With a clinical pharmacy team visit?  Wyn Quaker, FNP

## 2019-08-13 NOTE — Telephone Encounter (Signed)
Called and spoke to pt's husband. Appt made with the pharmacy team for 1/22 at 1100 to review her meds. Pt's husband verbalized understanding and denied any further questions or concerns at this time.

## 2019-08-13 NOTE — Telephone Encounter (Signed)
[name] husband would like to know if we have samples of Anoro inhaler.  [name] phone number is [phone].

## 2019-08-14 ENCOUNTER — Other Ambulatory Visit: Payer: Self-pay

## 2019-08-14 ENCOUNTER — Ambulatory Visit (INDEPENDENT_AMBULATORY_CARE_PROVIDER_SITE_OTHER): Payer: Medicare Other | Admitting: Pharmacist

## 2019-08-14 DIAGNOSIS — Z79899 Other long term (current) drug therapy: Secondary | ICD-10-CM | POA: Diagnosis not present

## 2019-08-14 DIAGNOSIS — J432 Centrilobular emphysema: Secondary | ICD-10-CM | POA: Diagnosis not present

## 2019-08-14 MED ORDER — ALBUTEROL SULFATE (2.5 MG/3ML) 0.083% IN NEBU: 2.5000 mg | INHALATION_SOLUTION | Freq: Four times a day (QID) | RESPIRATORY_TRACT | 12 refills | Status: DC | PRN

## 2019-08-14 MED ORDER — ARFORMOTEROL TARTRATE 15 MCG/2ML IN NEBU: 15.0000 ug | INHALATION_SOLUTION | Freq: Two times a day (BID) | RESPIRATORY_TRACT | 11 refills | Status: AC

## 2019-08-14 MED ORDER — YUPELRI 175 MCG/3ML IN SOLN: 175.0000 ug | Freq: Every day | RESPIRATORY_TRACT | 11 refills | Status: AC

## 2019-08-14 NOTE — Progress Notes (Signed)
Subjective Patient presents today to [facility] to see pharmacy team for inhaler education. Patient was referred by Wyn Quaker, FNP-C, on 07/07/2019.  Past medical history includes HTN, Afib, HF, emphysema, bronchiectasis, hx of closed right hip fracture (2018), CKD, anemia, hx of lung nodule (2020). Of note, patient spoke with Dr. Luetta Nutting Yopp on 07/22/2019 to express concern regarding how Anoro Ellipta will not be covered under her new insurance plan for 2021. She also wanted to stress that she has experienced an adverse event to [facility] and does not want to be on an inhaler with steroid component. After receiving list of formulary alternatives, Wyn Quaker switched patient from [employer] to [employer].  At prior appt with Dr. Toribio Harbour (08/05/2019) inhaler optimization was further assessed using the In-Check Dial. Based on these results it was determined optimal inhaler device would be respimat device. Patient trialed Stiolto Respimat however noticed SOB on this agent therefore stopped using. Remaining medication options that patient is eligible to use are LABA/LAMA nebs.   Patient presents with her husband (permission given). Patient reports she not been taking Stiolto Respimat in the past 7 days. However, in the past 7 days she used Anoro Ellipta for the 3/7 days. She states she noticed some benefit with Anoro Ellipta. Patient brings nebulizer machine to appt and has questions regarding how to use it.   Respiratory medications Current: None  Tried in past: Anoro Ellipta (inadequate response), Breo Ellipta (dizziness, "not herself"), Stiolto Respimat (SOB)  COPD Questionnaire  CAT ASSESSMENT  Rank each of the following items on a scale of 0 to 5 (with 5 being most severe) Write a # 0-5 in each box  I never cough (0) > I cough all the time (5) 0  I have no phlegm (mucus) in my chest (0) > My chest is completely full of phlegm (mucus) (5) 0  My chest does not feel tight at all (0)  > My chest feels very tight (5) 0  When I walk up a hill or one flight of stairs I am not breathless (0) > When I walk up a hill or one flight of stairs I am very breathless (5) 5  I am not limited doing any activities at home (0) > I am very limited doing activities at home (5) 3  I am confident leaving my home despite my lung function (0) > I am not at all confident leaving my home because of my lung condition (5)  5  I sleep soundly (0) > I don't sleep soundly because of my lung condition (5) 0  I have lots of energy (0) > I have no energy at all (5) 0   Total CAT Score: 13  Number of hospitilizations in the last year: 1  Number of COPD exacerbations in the last year: 0   Objective Allergies  Allergen Reactions  . Penicillins Swelling    Did it involve swelling of the face/tongue/throat, SOB, or low BP? Yes Did it involve sudden or severe rash/hives, skin peeling, or any reaction on the inside of your mouth or nose? No Did you need to seek medical attention at a hospital or doctor's office? No When did it last happen?childhood allergy If all above answers are "NO", may proceed with cephalosporin use.     Outpatient Encounter Medications as of 08/14/2019  Medication Sig Note  . acetaminophen (TYLENOL) 500 MG tablet Take 1,000 mg by mouth daily as needed for moderate pain or headache.   Marland Kitchen  alendronate (FOSAMAX) 70 MG tablet Take 70 mg by mouth every Saturday. Take with a full glass of water on an empty stomach.   Marland Kitchen amiodarone (PACERONE) 200 MG tablet Take 1 tablet (200 mg total) by mouth daily.   Marland Kitchen apixaban (ELIQUIS) 2.5 MG TABS tablet Take 1 tablet (2.5 mg total) by mouth 2 (two) times daily.   . carvedilol (COREG) 12.5 MG tablet Take 12.5 mg by mouth 2 (two) times daily with a meal.   . Cholecalciferol (VITAMIN D3) 2000 units TABS Take 2,000 Units by mouth daily.    . clonazePAM (KLONOPIN) 0.5 MG tablet Take 0.5 mg by mouth 2 (two) times daily.   . Ferrous Sulfate (IRON) 28  MG TABS Take 28 mg by mouth daily.    . furosemide (LASIX) 20 MG tablet Take 20 mg by mouth daily as needed for edema.  08/14/2019: Reports using 1.5 months ago (1/22)  . hydrALAZINE (APRESOLINE) 25 MG tablet Take 25 mg by mouth 2 (two) times daily.    Marland Kitchen latanoprost (XALATAN) 0.005 % ophthalmic solution Place 1 drop into both eyes at bedtime.   Marland Kitchen loperamide (IMODIUM) 2 MG capsule Take 4 mg by mouth 4 (four) times daily as needed for diarrhea or loose stools.   . mirtazapine (REMERON) 15 MG tablet Take 15 mg by mouth at bedtime.    Marland Kitchen venlafaxine XR (EFFEXOR-XR) 150 MG 24 hr capsule Take 150 mg by mouth 2 (two) times daily.   Marland Kitchen albuterol (PROVENTIL) (2.5 MG/3ML) 0.083% nebulizer solution Take 3 mLs (2.5 mg total) by nebulization every 6 (six) hours as needed for wheezing or shortness of breath.   Marland Kitchen albuterol (VENTOLIN HFA) 108 (90 Base) MCG/ACT inhaler INHALE 2 PUFFS INTO THE LUNGS EVERY 6 HOURS AS NEEDED FOR WHEEZING OR SHORTNESS OF BREATH (Patient not taking: Reported on 08/14/2019)   . potassium chloride (K-DUR) 10 MEQ tablet Take 10 mEq by mouth daily as needed (when taking lasix).  08/14/2019: Takes with lasix per instructions of inpatient cardiologist per pt report (1/22)  . Tiotropium Bromide-Olodaterol (STIOLTO RESPIMAT) 2.5-2.5 MCG/ACT AERS Inhale 2 puffs into the lungs daily. (Patient not taking: Reported on 08/14/2019)   . umeclidinium-vilanterol (ANORO ELLIPTA) 62.5-25 MCG/INH AEPB Inhale 1 puff into the lungs daily. (Patient not taking: Reported on 08/14/2019)    No facility-administered encounter medications on file as of 08/14/2019.      There is no immunization history on file for this patient.   PFTs None on file.  Chest X-ray 07/24/2018-CT chest without contrast- Increased reticular nodular densities are noted in the right lower lobe most consistent with atypical inflammation or infection some bronchitis and mucous plugging may be present as well, stable reticular densities are noted  in the right middle lobe and left lower lobe which may represent scarring or sequela from previous atypical inflammation, 6 mm nodules noted in the right upper lobe which is significantly enlarged compared to prior exam, also noted a stable 6 mm nodule in left upper lobe, noncontrast chest CT at 3 to 6 months is recommended  Eosinophils Most recent blood eosinophil count was 0.2 cells/microL taken on 04/15/2019.  In-Check DIAL (08/05/2019) 1. Inspiratory flow measured using the In-check DIAL G16 and was NOT in range of 30-90 for use of Ellipta device. Patient scored 10.  2. Inspiratory flow measured using the In-check DIAL G16 and was in range of 20-60 for use of Respimat device. Patient scored 20.   Assessment and Plan  1. Inhaler Optimization Garlon Hatchet, Maretta Bees)  a. Based on patient's In-Check Dial scores her remaining medication options are LABA/LAMA nebulized solutions. Frederik Schmidt, CPT, ran a test claim for inhalers. It appears she has a $200 deductible on medical benefits, however, she also has a supplement card that may cover additional costs beyond the benefits. Will send prescriptions for Garlon Hatchet and Yupelri to [facility]. Instructed patient that Junior will reach out to her for any additional questions.Explained to patient that she will use Brovana twice daily and Yupelri once daily. Instructed patient to schedule follow up appt with pharmacy team to thoroughly counsel patient on how to use Brovana and Yupelri once she receives prescriptions in the mail. Counseled pateint on how to use nebulizer and even had patient's husband put together nebulizer machine in appt to ensure he knew how to use to instruct patient. Provided rx for albuterol nebs to use as needed to [facility]. Explained to patient in the interim time frame until she gets Brovana/Yupelri she can use Anoro Ellipta once daily since it provided her with slight benefit (provided patient with sample (LOT number:  [id number], EXP date: 09/2020).  Patient and husband verbalized understanding.  2. Medication Reconciliation a. A drug regimen assessment was performed, including review of allergies, interactions, disease-state management, dosing and immunization history. Medications were reviewed with the patient, including name, instructions, indication, goals of therapy, potential side effects, importance of adherence, and safe use. b. Initiated i. Yupelri ii. Brovana c. Discontinued i. Stiolto Respimat  3. Immunizations a. Patient is indicated for the influenzae, pneumonia, and shingles vaccinations. Unable to address at this appt - will address at f/u appt.  This appointment required 45 minutes of patient care (this includes precharting, chart review, review of results, face-to-face care, etc.)  Thank you for involving pharmacy to assist in providing Mrs. Oehlert's care.   Drexel Iha, PharmD PGY2 Ambulatory Care Pharmacy Resident

## 2019-08-14 NOTE — Patient Instructions (Signed)
It was a pleasure seeing you in clinic today Mrs. Grosser!  Today the plan is... 1. Start using albuterol nebulizer as needed with nebulizer. Remember to use nebulizer for 15 minutes. You should be able to pick up from [facility] today. 2. We will give you a sample of Anoro Ellipta once daily since it offered you slight benefit until we can get you other nebulized medications Garlon Hatchet, Yupelri). 3. Please schedule appointment with pharmacist Luetta Nutting, [facility]) once you receive [place].  Please call Amber's phone number at [phone] and leave voicemail if you have any concerns. You can also call PharmD clinic at [phone] if you have any questions that you would like to speak with a pharmacist about [facility]ney, Museum/gallery conservator).

## 2019-08-19 ENCOUNTER — Ambulatory Visit: Payer: Medicare Other

## 2019-08-20 DIAGNOSIS — R946 Abnormal results of thyroid function studies: Secondary | ICD-10-CM | POA: Diagnosis not present

## 2019-08-24 ENCOUNTER — Telehealth: Payer: Self-pay | Admitting: Pulmonary Disease

## 2019-08-24 NOTE — Telephone Encounter (Signed)
Returned patient's husband's call Delfino Lovett) on 08/24/2019 at 11:17 AM   [name] confirmed that they have received [place] and Yupelri via mail. Instructed patient they can use Brovana (twice daily) and Yupelri (once daily) as soon as possible if patient has not been using Anoro Ellipta (pt reported use at last pharmacy appt on 08/14/2019). If patient has been using Anoro Ellipta, recommend that patient starts using [place] and Yupelri the next day. Patient's husband states that they will plan to administer 1st dose of Brovana in the AM, Yupelri in the afternoon, and 2nd dose of Brovana in the evening. Advised patient to try to separate Brovana doses about 12 hours apart. Patient verbalized understanding.  Thank you for involving pharmacy to assist in providing this patient's care.   Drexel Iha, PharmD PGY2 Ambulatory Care Pharmacy Resident

## 2019-08-24 NOTE — Telephone Encounter (Signed)
Spoke with pt, pt's spouse, and pt's daughter- advised that the covid vaccine would not cause a false positive covid test.  Pt's daughter also had questions regarding pt receiving the covid vaccine and having a PMH of Crohn's disease.  I advised daughter to follow up with provider treating pt for her Crohn's to see if this would be an issue regarding her vaccine.  Daughter expressed understanding.  Nothing further needed at this time- will close encounter.

## 2019-08-24 NOTE — Telephone Encounter (Signed)
Thank you [facility] for working with them   Wyn Quaker FNP

## 2019-08-26 ENCOUNTER — Other Ambulatory Visit: Payer: Self-pay

## 2019-08-26 ENCOUNTER — Encounter (HOSPITAL_COMMUNITY): Payer: Self-pay | Admitting: Physician Assistant

## 2019-08-26 ENCOUNTER — Ambulatory Visit (HOSPITAL_COMMUNITY)
Admission: RE | Admit: 2019-08-26 | Discharge: 2019-08-26 | Disposition: A | Discharge status: Home / Self Care | Payer: Medicare Other | Source: Ambulatory Visit | Attending: Physician Assistant | Admitting: Physician Assistant

## 2019-08-26 VITALS — BP 112/62 | HR 65 | Ht 60.0 in | Wt 96.2 lb

## 2019-08-26 DIAGNOSIS — Z87891 Personal history of nicotine dependence: Secondary | ICD-10-CM | POA: Diagnosis not present

## 2019-08-26 DIAGNOSIS — Z7983 Long term (current) use of bisphosphonates: Secondary | ICD-10-CM | POA: Diagnosis not present

## 2019-08-26 DIAGNOSIS — N189 Chronic kidney disease, unspecified: Secondary | ICD-10-CM | POA: Insufficient documentation

## 2019-08-26 DIAGNOSIS — I5042 Chronic combined systolic (congestive) and diastolic (congestive) heart failure: Secondary | ICD-10-CM | POA: Insufficient documentation

## 2019-08-26 DIAGNOSIS — D6869 Other thrombophilia: Secondary | ICD-10-CM | POA: Insufficient documentation

## 2019-08-26 DIAGNOSIS — I4819 Other persistent atrial fibrillation: Secondary | ICD-10-CM | POA: Diagnosis not present

## 2019-08-26 DIAGNOSIS — Z820 Family history of epilepsy and other diseases of the nervous system: Secondary | ICD-10-CM | POA: Diagnosis not present

## 2019-08-26 DIAGNOSIS — Z79899 Other long term (current) drug therapy: Secondary | ICD-10-CM | POA: Diagnosis not present

## 2019-08-26 DIAGNOSIS — I2510 Atherosclerotic heart disease of native coronary artery without angina pectoris: Secondary | ICD-10-CM | POA: Diagnosis not present

## 2019-08-26 DIAGNOSIS — Z7901 Long term (current) use of anticoagulants: Secondary | ICD-10-CM | POA: Insufficient documentation

## 2019-08-26 DIAGNOSIS — Z801 Family history of malignant neoplasm of trachea, bronchus and lung: Secondary | ICD-10-CM | POA: Diagnosis not present

## 2019-08-26 DIAGNOSIS — Z79890 Hormone replacement therapy (postmenopausal): Secondary | ICD-10-CM | POA: Diagnosis not present

## 2019-08-26 DIAGNOSIS — I130 Hypertensive heart and chronic kidney disease with heart failure and stage 1 through stage 4 chronic kidney disease, or unspecified chronic kidney disease: Secondary | ICD-10-CM | POA: Diagnosis not present

## 2019-08-26 DIAGNOSIS — Z880 Allergy status to penicillin: Secondary | ICD-10-CM | POA: Insufficient documentation

## 2019-08-26 DIAGNOSIS — K5090 Crohn's disease, unspecified, without complications: Secondary | ICD-10-CM | POA: Insufficient documentation

## 2019-08-26 DIAGNOSIS — I4891 Unspecified atrial fibrillation: Secondary | ICD-10-CM | POA: Diagnosis present

## 2019-08-26 LAB — COMPREHENSIVE METABOLIC PANEL
ALT: 13 U/L (ref 0–44)
AST: 18 U/L (ref 15–41)
Albumin: 3 g/dL — ABNORMAL LOW (ref 3.5–5.0)
Alkaline Phosphatase: 83 U/L (ref 38–126)
Anion gap: 9 (ref 5–15)
BUN: 26 mg/dL — ABNORMAL HIGH (ref 8–23)
CO2: 20 mmol/L — ABNORMAL LOW (ref 22–32)
Calcium: 8.7 mg/dL — ABNORMAL LOW (ref 8.9–10.3)
Chloride: 111 mmol/L (ref 98–111)
Creatinine, Ser: 1.93 mg/dL — ABNORMAL HIGH (ref 0.44–1.00)
GFR calc Af Amer: 27 mL/min — ABNORMAL LOW (ref >60)
GFR calc non Af Amer: 23 mL/min — ABNORMAL LOW (ref >60)
Glucose, Bld: 88 mg/dL (ref 70–99)
Potassium: 5.6 mmol/L — ABNORMAL HIGH (ref 3.5–5.1)
Sodium: 140 mmol/L (ref 135–145)
Total Bilirubin: 0.4 mg/dL (ref 0.3–1.2)
Total Protein: 6.3 g/dL — ABNORMAL LOW (ref 6.5–8.1)

## 2019-08-26 LAB — TSH: TSH: 11.172 u[IU]/mL — ABNORMAL HIGH (ref 0.350–4.500)

## 2019-08-26 NOTE — Progress Notes (Signed)
Primary Care Physician: Harlan Stains, MD Primary Cardiologist: Dr Angelena Form Referring Physician: Daune Perch NP   Melissa Mcdaniel is a 85 y.o. female with a history of persistent atrial fibrillation, HTN, CAD, chronic combined diastolic/systolic CHF, Crohn's diseaseandcolon cancer who presents for consultation in the [facility].  The patient was initially diagnosed with atrial fibrillation years ago and has done well on amiodarone. However, recently she has had elevated HR in the low 100's associated with fatigue and dyspnea. On follow up with Daune Perch, she was found to be in persistent atrial fibrillation. Patient is s/p DCCV 02/04/19. She is on Eliquis for a CHADS2VASC score of 5.   On follow up today, patient reports that she has done very well from a cardiac standpoint. She denies any heart racing or palpitations. She is tolerating the medications without difficulty.   Today, she denies symptoms of palpitations, chest pain, orthopnea, PND, lower extremity edema, dizziness, presyncope, syncope, snoring, daytime somnolence, bleeding, or neurologic sequela. The patient is tolerating medications without difficulties and is otherwise without complaint today.    Atrial Fibrillation Risk Factors:  she does not have symptoms or diagnosis of sleep apnea.  she has a BMI of Body mass index is 18.79 kg/m.Marland Kitchen Filed Weights   08/26/19 1058  Weight: 43.6 kg    Family History  Problem Relation Age of Onset  . Lung cancer Mother   . Alzheimer's disease Father   . Diabetes Maternal Grandmother      Atrial Fibrillation Management history:  Previous antiarrhythmic drugs: amiodarone Previous cardioversions: 2015, 02/04/19 Previous ablations: none CHADS2VASC score: 5 Anticoagulation history: Eliquis   Past Medical History:  Diagnosis Date  . A-fib ([place])   . Chronic kidney disease   . Congestive heart failure (CHF) ([place]) 05/2016  . Crohn's  disease ([place])   . Dysrhythmia    afib dx approx. 2016 per pt  . Hip fracture ([place])   . Hypertension    Past Surgical History:  Procedure Laterality Date  . CARDIOVERSION N/A 02/04/2019   Procedure: CARDIOVERSION;  Surgeon: Buford Dresser, MD;  Location: [facility] ENDOSCOPY;  Service: Cardiovascular;  Laterality: N/A;  . FEMUR IM NAIL Right 07/26/2016   Procedure: INTRAMEDULLARY (IM) NAIL FEMORAL;  Surgeon: Renette Butters, MD;  Location: [facility];  Service: Orthopedics;  Laterality: Right;  . FRACTURE SURGERY    . large intestine - partial removal  approx 1993   due to crohns    Current Outpatient Medications  Medication Sig Dispense Refill  . acetaminophen (TYLENOL) 500 MG tablet Take 1,000 mg by mouth daily as needed for moderate pain or headache.    . albuterol (PROVENTIL) (2.5 MG/3ML) 0.083% nebulizer solution Take 3 mLs (2.5 mg total) by nebulization every 6 (six) hours as needed for wheezing or shortness of breath. 75 mL 12  . alendronate (FOSAMAX) 70 MG tablet Take 70 mg by mouth every Saturday. Take with a full glass of water on an empty stomach.    Marland Kitchen amiodarone (PACERONE) 200 MG tablet Take 1 tablet (200 mg total) by mouth daily. 180 tablet 1  . apixaban (ELIQUIS) 2.5 MG TABS tablet Take 1 tablet (2.5 mg total) by mouth 2 (two) times daily. 180 tablet 2  . arformoterol (BROVANA) 15 MCG/2ML NEBU Take 2 mLs (15 mcg total) by nebulization 2 (two) times daily. 120 mL 11  . carvedilol (COREG) 12.5 MG tablet Take 12.5 mg by mouth 2 (two) times daily with a meal.    .  Cholecalciferol (VITAMIN D3) 2000 units TABS Take 2,000 Units by mouth daily.     . clonazePAM (KLONOPIN) 0.5 MG tablet Take 0.5 mg by mouth 2 (two) times daily.    . Ferrous Sulfate (IRON) 28 MG TABS Take 28 mg by mouth daily.     . furosemide (LASIX) 20 MG tablet Take 20 mg by mouth daily as needed for edema.     . hydrALAZINE (APRESOLINE) 25 MG tablet Take 25 mg by mouth 2 (two) times daily.     Marland Kitchen latanoprost (XALATAN)  0.005 % ophthalmic solution Place 1 drop into both eyes at bedtime.    Marland Kitchen levothyroxine (SYNTHROID) 25 MCG tablet Take 25 mcg by mouth every morning.    . loperamide (IMODIUM) 2 MG capsule Take 4 mg by mouth 4 (four) times daily as needed for diarrhea or loose stools.    . mirtazapine (REMERON) 15 MG tablet Take 15 mg by mouth at bedtime.     . potassium chloride (K-DUR) 10 MEQ tablet Take 10 mEq by mouth daily as needed (when taking lasix).     . Revefenacin (YUPELRI) 175 MCG/3ML SOLN Inhale 175 mcg into the lungs daily. 90 mL 11  . venlafaxine XR (EFFEXOR-XR) 150 MG 24 hr capsule Take 150 mg by mouth 2 (two) times daily.     No current facility-administered medications for this encounter.    Allergies  Allergen Reactions  . Penicillins Swelling    Did it involve swelling of the face/tongue/throat, SOB, or low BP? Yes Did it involve sudden or severe rash/hives, skin peeling, or any reaction on the inside of your mouth or nose? No Did you need to seek medical attention at a hospital or doctor's office? No When did it last happen?childhood allergy If all above answers are "NO", may proceed with cephalosporin use.     Social History   Socioeconomic History  . Marital status: Married    Spouse name: Not on file  . Number of children: 3  . Years of education: Not on file  . Highest education level: Not on file  Occupational History  . Occupation: Worked at [employer]  . Smoking status: Former Smoker    Packs/day: 0.50    Years: 30.00    Pack years: 15.00    Types: Cigarettes    Start date: 49    Quit date: 1980    Years since quitting: 41.1  . Smokeless tobacco: Never Used  Substance and Sexual Activity  . Alcohol use: No  . Drug use: No  . Sexual activity: Not on file  Other Topics Concern  . Not on file  Social History Narrative  . Not on file   Social Determinants of Health   Financial Resource Strain:   . Difficulty of Paying Living  Expenses: Not on file  Food Insecurity:   . Worried About Charity fundraiser in the Last Year: Not on file  . Ran Out of Food in the Last Year: Not on file  Transportation Needs:   . Lack of Transportation (Medical): Not on file  . Lack of Transportation (Non-Medical): Not on file  Physical Activity:   . Days of Exercise per Week: Not on file  . Minutes of Exercise per Session: Not on file  Stress:   . Feeling of Stress : Not on file  Social Connections:   . Frequency of Communication with Friends and Family: Not on file  . Frequency of Social Gatherings with Friends and  Family: Not on file  . Attends Religious Services: Not on file  . Active Member of Clubs or Organizations: Not on file  . Attends Archivist Meetings: Not on file  . Marital Status: Not on file  Intimate Partner Violence:   . Fear of Current or Ex-Partner: Not on file  . Emotionally Abused: Not on file  . Physically Abused: Not on file  . Sexually Abused: Not on file     ROS- All systems are reviewed and negative except as per the HPI above.  Physical Exam: Vitals:   08/26/19 1058  BP: 112/62  Pulse: 65  Weight: 43.6 kg  Height: 5' (1.524 m)    GEN- The patient is well appearing elderly female, alert and oriented x 3 today.   HEENT-head normocephalic, atraumatic, sclera clear, conjunctiva pink, hearing intact, trachea midline. Lungs- Clear to ausculation bilaterally, normal work of breathing Heart- Regular rate and rhythm, no murmurs, rubs or gallops  GI- soft, NT, ND, + BS Extremities- no clubbing, cyanosis, or edema MS- no significant deformity or atrophy Skin- no rash or lesion Psych- euthymic mood, full affect Neuro- strength and sensation are intact   Wt Readings from Last 3 Encounters:  08/26/19 43.6 kg  07/04/19 44.9 kg  06/26/19 45.8 kg    EKG today demonstrates SR HR 65, LVH, PR 202, QRS 90, QTc 445  Echo 03/31/2018 demonstrated  - Left ventricle: The cavity size was  normal. There was mild   concentric hypertrophy. Systolic function was normal. The   estimated ejection fraction was in the range of 55% to 60%. Wall   motion was normal; there were no regional wall motion   abnormalities. Features are consistent with a pseudonormal left   ventricular filling pattern, with concomitant abnormal relaxation   and increased filling pressure (grade 2 diastolic dysfunction).   Doppler parameters are consistent with high ventricular filling   pressure. - Aortic valve: Transvalvular velocity was within the normal range.   There was no stenosis. There was mild regurgitation. Valve area   (VTI): 1.45 cm^2. Valve area (Vmax): 1.56 cm^2. Valve area   (Vmean): 1.53 cm^2. - Mitral valve: Transvalvular velocity was within the normal range.   There was no evidence for stenosis. There was trivial   regurgitation. - Left atrium: The atrium was severely dilated. - Right ventricle: The cavity size was normal. Wall thickness was   normal. Systolic function was normal. - Atrial septum: No defect or patent foramen ovale was identified   by color flow Doppler. - Tricuspid valve: There was mild regurgitation. - Pulmonary arteries: Systolic pressure was within the normal   range. PA peak pressure: 35 mm Hg (S).  Epic records are reviewed at length today  Assessment and Plan:  1. Persistent atrial fibrillation S/p DCCV 02/04/19. Patient appears to be maintaining SR. Continue amiodarone to 200 mg daily. Continue Coreg 12.5 mg BID.  Continue Eliquis 2.5 mg BID. Check Cmet/TSH today.  This patients CHA2DS2-VASc Score and unadjusted Ischemic Stroke Rate (% per year) is equal to 7.2 % stroke rate/year from a score of 5  Above score calculated as 1 point each if present [CHF, HTN, DM, Vascular=MI/PAD/Aortic Plaque, Age if 65-74, or Female] Above score calculated as 2 points each if present [Age > 75, or Stroke/TIA/TE]   2. Combined chronic systolic and diastolic CHF No  signs or symptoms of fluid overload.   3. HTN Stable, no changes today.  4. CAD No anginal symptoms. Followed by Dr  McAlhany    Follow up with Dr Angelena Form per recall. AF clinic as needed.   [facility] [address] [place], [place] [address] [phone] 08/26/2019 11:23 AM

## 2019-08-27 ENCOUNTER — Ambulatory Visit: Payer: Medicare Other | Attending: Internal Medicine

## 2019-08-27 DIAGNOSIS — Z23 Encounter for immunization: Secondary | ICD-10-CM | POA: Insufficient documentation

## 2019-08-27 NOTE — Progress Notes (Signed)
   Covid-19 Vaccination Clinic  Name:  Melissa Mcdaniel    MRN: [id number] DOB: 03-07-1933  08/27/2019  Melissa Mcdaniel was observed post Covid-19 immunization for 15 minutes without incidence. She was provided with Vaccine Information Sheet and instruction to access the V-Safe system.   Melissa Mcdaniel was instructed to call 911 with any severe reactions post vaccine: Marland Kitchen Difficulty breathing  . Swelling of your face and throat  . A fast heartbeat  . A bad rash all over your body  . Dizziness and weakness    Immunizations Administered    Name Date Dose VIS Date Route   Pfizer COVID-19 Vaccine 08/27/2019  3:08 PM 0.3 mL 07/03/2019 Intramuscular   Manufacturer: [place]   Lot: [id number]   [place]: [id number]

## 2019-08-29 ENCOUNTER — Other Ambulatory Visit (HOSPITAL_COMMUNITY)
Admission: RE | Admit: 2019-08-29 | Discharge: 2019-08-29 | Disposition: A | Discharge status: Home / Self Care | Payer: Medicare Other | Source: Ambulatory Visit | Attending: Pulmonary Disease | Admitting: Pulmonary Disease

## 2019-08-29 DIAGNOSIS — Z01812 Encounter for preprocedural laboratory examination: Secondary | ICD-10-CM | POA: Diagnosis not present

## 2019-08-29 DIAGNOSIS — Z20822 Contact with and (suspected) exposure to covid-19: Secondary | ICD-10-CM | POA: Diagnosis not present

## 2019-08-29 LAB — SARS CORONAVIRUS 2 (TAT 6-24 HRS): SARS Coronavirus 2: NEGATIVE

## 2019-08-30 ENCOUNTER — Ambulatory Visit: Payer: Medicare Other

## 2019-08-31 NOTE — Progress Notes (Signed)
@Patient  ID: Melissa Mcdaniel, female    DOB: 1933-07-03, 86 y.o.   MRN: [id number]  Chief Complaint  Patient presents with  . Follow-up    F/U after PFT. States her SOB has increased since last visit.     Referring provider: Harlan Stains, MD  HPI:  86 year old female former smoker followed in our office for emphysema  PMH:  chronic diastolic heart failure, atrial fibrillation, hypertension, CKD, depression and recent pneumonia who presents for follow-up. Smoker/ Smoking History: Former smoker.  15-pack-year smoking history. Maintenance: Candiss Norse Pt of: Dr. Loanne Drilling  09/01/2019  - Visit   86 year old female former smoker followed in our office for COPD.  Since last being seen in December/2020 during a virtual visit patient has worked with our clinical pharmacy team regarding medication adherence as well as maintenance medications.  Last note in chart from 08/24/2019 shows that patient has received Garlon Hatchet as well as Yupelri via the mail.  Patient presenting to our office today after completing pulmonary function test. Pulmonary function testing results are listed below:  09/01/2019-pulmonary function test-FVC 1.11 (70% predicted), postbronchodilator ratio 62, postbronchodilator FEV1 0.66 (58% predicted), no bronchodilator response, DLCO 5.81 (41% predicted)  Patient reporting that she feels that the nebulizers may be helping some with her dyspnea.  She is not fully sure yet.  She just recently started these.  She does feel that they are easier to use than her inhalers.  This was the first time she never done a breathing test.  Questionaires / Pulmonary Flowsheets:   MMRC: mMRC Dyspnea Scale mMRC Score  09/01/2019 3    Tests:   07/24/2018-CT chest without contrast- Increased reticular nodular densities are noted in the right lower lobe most consistent with atypical inflammation or infection some bronchitis and mucous plugging may be present as well, stable reticular densities  are noted in the right middle lobe and left lower lobe which may represent scarring or sequela from previous atypical inflammation, 6 mm nodules noted in the right upper lobe which is significantly enlarged compared to prior exam, also noted a stable 6 mm nodule in left upper lobe, noncontrast chest CT at 3 to 6 months is recommended  02/09/2019-CT chest without contrast- no acute cardiopulmonary disease, chronic bilateral patchy reticular nodular pattern of opacification with areas of discrete nodularity and mild bronchiectatic change over the right lower lobe, findings not significantly changed from 02/05/2018, slightly improved overall compared to 07/24/2018, findings likely due to chronic inflammatory or atypical infectious process,  03/31/2018-echocardiogram- LV ejection fraction 55 to 60%, PA pressure 35  FENO:  No results found for: NITRICOXIDE  PFT: PFT Results Latest Ref Rng & Units 09/01/2019  FVC-Pre L 1.11  FVC-Predicted Pre % 70  FVC-Post L 1.07  FVC-Predicted Post % 67  Pre FEV1/FVC % % 60  Post FEV1/FCV % % 62  FEV1-Pre L 0.67  FEV1-Predicted Pre % 59  FEV1-Post L 0.66  DLCO UNC% % 41  DLCO COR %Predicted % 76  TLC L 5.35  TLC % Predicted % 136  RV % Predicted % 198    WALK:  SIX MIN WALK 03/06/2019 02/16/2019  Supplimental Oxygen during Test? (L/min) No No  Tech Comments: Patient was able to complete walk. She walked at a slow pace due to having hip pain. Declined having any chest pain. No O2 was not needed during or after walk. patient stopped during lap 2 to catch breath. sats dropped to 90 but came back up with some breathing. Patient's legs were  hurting only completed 2 laps. patient was slightly dizzy and short of breath toward end of lap 2. patient walked and a moderate pace    Imaging: No results found.  Lab Results:  CBC    Component Value Date/Time   WBC 5.0 05/06/2019 1240   RBC 3.29 (L) 05/06/2019 1240   HGB 9.4 (L) 05/06/2019 1240   HGB 10.0 (L)  01/30/2019 1351   HCT 30.1 (L) 05/06/2019 1240   HCT 31.5 (L) 01/30/2019 1351   PLT 209 05/06/2019 1240   PLT 243 01/30/2019 1351   MCV 91.5 05/06/2019 1240   MCV 86 01/30/2019 1351   MCH 28.6 05/06/2019 1240   MCHC 31.2 05/06/2019 1240   RDW 14.2 05/06/2019 1240   RDW 13.0 01/30/2019 1351   LYMPHSABS 0.8 04/15/2019 1545   LYMPHSABS 0.8 01/30/2019 1351   MONOABS 0.5 04/15/2019 1545   EOSABS 0.2 04/15/2019 1545   EOSABS 0.1 01/30/2019 1351   BASOSABS 0.1 04/15/2019 1545   BASOSABS 0.1 01/30/2019 1351    BMET    Component Value Date/Time   NA 140 08/26/2019 1123   NA 137 01/30/2019 1351   K 5.6 (H) 08/26/2019 1123   CL 111 08/26/2019 1123   CO2 20 (L) 08/26/2019 1123   GLUCOSE 88 08/26/2019 1123   BUN 26 (H) 08/26/2019 1123   BUN 23 01/30/2019 1351   CREATININE 1.93 (H) 08/26/2019 1123   CALCIUM 8.7 (L) 08/26/2019 1123   GFRNONAA 23 (L) 08/26/2019 1123   GFRAA 27 (L) 08/26/2019 1123    BNP    Component Value Date/Time   BNP 367.9 (H) 03/29/2018 0627    ProBNP    Component Value Date/Time   PROBNP 270.0 (H) 04/15/2019 1545    Specialty Problems      Pulmonary Problems   Bronchiectasis without complication (HCC)   Centrilobular emphysema (HCC)   Lung nodule, multiple      Allergies  Allergen Reactions  . Penicillins Swelling    Did it involve swelling of the face/tongue/throat, SOB, or low BP? Yes Did it involve sudden or severe rash/hives, skin peeling, or any reaction on the inside of your mouth or nose? No Did you need to seek medical attention at a hospital or doctor's office? No When did it last happen?childhood allergy If all above answers are "NO", may proceed with cephalosporin use.   . Levothyroxine Nausea And Vomiting    Immunization History  Administered Date(s) Administered  . PFIZER SARS-COV-2 Vaccination 08/27/2019   Recommended patient to also get Pneumovax 23 at next office visit, we can hold off for this right now as patient  just recently had her Pfizer vaccine, will get second dose later on this month  Past Medical History:  Diagnosis Date  . A-fib ([place])   . Chronic kidney disease   . Congestive heart failure (CHF) ([place]) 05/2016  . Crohn's disease ([place])   . Dysrhythmia    afib dx approx. 2016 per pt  . Hip fracture ([place])   . Hypertension     Tobacco History: Social History   Tobacco Use  Smoking Status Former Smoker  . Packs/day: 0.50  . Years: 30.00  . Pack years: 15.00  . Types: Cigarettes  . Start date: 80  . Quit date: 28  . Years since quitting: 41.1  Smokeless Tobacco Never Used   Counseling given: Not Answered   Continue to not smoke  Outpatient Encounter Medications as of 09/01/2019  Medication Sig  . acetaminophen (TYLENOL) 500  MG tablet Take 1,000 mg by mouth daily as needed for moderate pain or headache.  . alendronate (FOSAMAX) 70 MG tablet Take 70 mg by mouth every Saturday. Take with a full glass of water on an empty stomach.  Marland Kitchen amiodarone (PACERONE) 200 MG tablet Take 1 tablet (200 mg total) by mouth daily.  Marland Kitchen apixaban (ELIQUIS) 2.5 MG TABS tablet Take 1 tablet (2.5 mg total) by mouth 2 (two) times daily.  Marland Kitchen arformoterol (BROVANA) 15 MCG/2ML NEBU Take 2 mLs (15 mcg total) by nebulization 2 (two) times daily.  . carvedilol (COREG) 12.5 MG tablet Take 12.5 mg by mouth 2 (two) times daily with a meal.  . Cholecalciferol (VITAMIN D3) 2000 units TABS Take 2,000 Units by mouth daily.   . clonazePAM (KLONOPIN) 0.5 MG tablet Take 0.5 mg by mouth 2 (two) times daily.  . Ferrous Sulfate (IRON) 28 MG TABS Take 28 mg by mouth daily.   . furosemide (LASIX) 20 MG tablet Take 20 mg by mouth daily as needed for edema.   . hydrALAZINE (APRESOLINE) 25 MG tablet Take 25 mg by mouth 2 (two) times daily.   Marland Kitchen latanoprost (XALATAN) 0.005 % ophthalmic solution Place 1 drop into both eyes at bedtime.  Marland Kitchen loperamide (IMODIUM) 2 MG capsule Take 4 mg by mouth 4 (four) times daily as needed for  diarrhea or loose stools.  . mirtazapine (REMERON) 15 MG tablet Take 15 mg by mouth at bedtime.   . potassium chloride (K-DUR) 10 MEQ tablet Take 10 mEq by mouth daily as needed (when taking lasix).   . Revefenacin (YUPELRI) 175 MCG/3ML SOLN Inhale 175 mcg into the lungs daily.  Marland Kitchen venlafaxine XR (EFFEXOR-XR) 150 MG 24 hr capsule Take 150 mg by mouth 2 (two) times daily.  . [DISCONTINUED] albuterol (PROVENTIL) (2.5 MG/3ML) 0.083% nebulizer solution Take 3 mLs (2.5 mg total) by nebulization every 6 (six) hours as needed for wheezing or shortness of breath.  . [DISCONTINUED] levothyroxine (SYNTHROID) 25 MCG tablet Take 25 mcg by mouth every morning.   No facility-administered encounter medications on file as of 09/01/2019.     Review of Systems  Review of Systems  Constitutional: Positive for fatigue. Negative for activity change and fever.  HENT: Negative for sinus pressure, sinus pain and sore throat.   Respiratory: Positive for shortness of breath. Negative for cough and wheezing.   Cardiovascular: Negative for chest pain and palpitations.  Gastrointestinal: Negative for diarrhea, nausea and vomiting.  Musculoskeletal: Negative for arthralgias.  Neurological: Positive for weakness. Negative for dizziness.  Psychiatric/Behavioral: Negative for sleep disturbance. The patient is not nervous/anxious.      Physical Exam  BP 112/70   Pulse 60   Temp (!) 97.3 F (36.3 C) (Temporal)   Ht 4' 8.5" (1.435 m)   Wt 95 lb 6.4 oz (43.3 kg)   SpO2 99% Comment: on RA  BMI 21.01 kg/m   Wt Readings from Last 5 Encounters:  09/01/19 95 lb 6.4 oz (43.3 kg)  08/26/19 96 lb 3.2 oz (43.6 kg)  07/04/19 99 lb (44.9 kg)  06/26/19 101 lb (45.8 kg)  05/06/19 103 lb (46.7 kg)    BMI Readings from Last 5 Encounters:  09/01/19 21.01 kg/m  08/26/19 18.79 kg/m  07/04/19 19.33 kg/m  06/26/19 19.73 kg/m  05/06/19 20.12 kg/m     Physical Exam Vitals and nursing note reviewed.  Constitutional:       General: She is not in acute distress.    Appearance: Normal appearance. She  is normal weight.     Comments: Thin elderly female  HENT:     Head: Normocephalic and atraumatic.     Right Ear: Tympanic membrane, ear canal and external ear normal. There is no impacted cerumen.     Left Ear: Tympanic membrane, ear canal and external ear normal. There is no impacted cerumen.     Nose: Nose normal. No congestion.     Mouth/Throat:     Mouth: Mucous membranes are moist.     Pharynx: Oropharynx is clear.  Eyes:     Pupils: Pupils are equal, round, and reactive to light.  Cardiovascular:     Rate and Rhythm: Normal rate and regular rhythm.     Pulses: Normal pulses.     Heart sounds: Normal heart sounds. No murmur.  Pulmonary:     Effort: Pulmonary effort is normal. No respiratory distress.     Breath sounds: No decreased air movement. No decreased breath sounds, wheezing or rales.     Comments: Diminished breath sounds.exam Barrel chest Musculoskeletal:     Cervical back: Normal range of motion.  Skin:    General: Skin is warm and dry.     Capillary Refill: Capillary refill takes less than 2 seconds.  Neurological:     General: No focal deficit present.     Mental Status: She is alert and oriented to person, place, and time. Mental status is at baseline.     Gait: Gait abnormal.  Psychiatric:        Mood and Affect: Mood normal.        Behavior: Behavior normal.        Thought Content: Thought content normal.        Judgment: Judgment normal.       Assessment & Plan:   Centrilobular emphysema ([place]) Felt to not be able to mobilize DPI inhalers Intolerant of Stiolto Respimat Barrel chest COPD Gold stage II on PFTs today   Plan: Walk today in office Continue Brovana nebulized meds Continue Yupelri nebulized meds Start increasing physical activity Pneumovax 23 at next office visit Proceed forward with second Covid vaccine as scheduled  Bronchiectasis without  complication ([place]) Plan: We will continue to monitor this clinically No acute worsening cough or increased congestion today  Medication management Tolerating maintenance nebulized medications  Plan: Continue Brovana nebulized meds Continue Yupelri nebulized meds    Return in about 2 months (around 10/30/2019), or if symptoms worsen or fail to improve, for Follow up with Dr. Loanne Drilling.   Lauraine Rinne, NP 09/01/2019   This appointment required 34 minutes of patient care (this includes precharting, chart review, review of results, face-to-face care, etc.).

## 2019-09-01 ENCOUNTER — Encounter: Payer: Self-pay | Admitting: Pulmonary Disease

## 2019-09-01 ENCOUNTER — Other Ambulatory Visit: Payer: Self-pay

## 2019-09-01 ENCOUNTER — Ambulatory Visit (INDEPENDENT_AMBULATORY_CARE_PROVIDER_SITE_OTHER): Payer: Medicare Other | Admitting: Pulmonary Disease

## 2019-09-01 VITALS — BP 112/70 | HR 60 | Temp 97.3°F | Ht <= 58 in | Wt 95.4 lb

## 2019-09-01 DIAGNOSIS — Z79899 Other long term (current) drug therapy: Secondary | ICD-10-CM | POA: Diagnosis not present

## 2019-09-01 DIAGNOSIS — J479 Bronchiectasis, uncomplicated: Secondary | ICD-10-CM | POA: Diagnosis not present

## 2019-09-01 DIAGNOSIS — J432 Centrilobular emphysema: Secondary | ICD-10-CM | POA: Diagnosis not present

## 2019-09-01 DIAGNOSIS — R918 Other nonspecific abnormal finding of lung field: Secondary | ICD-10-CM | POA: Diagnosis not present

## 2019-09-01 DIAGNOSIS — R0602 Shortness of breath: Secondary | ICD-10-CM

## 2019-09-01 LAB — PULMONARY FUNCTION TEST
DL/VA % pred: 76 %
DL/VA: 3.3 ml/min/mmHg/L
DLCO unc % pred: 41 %
DLCO unc: 5.81 ml/min/mmHg
FEF 25-75 Post: 0.29 L/sec
FEF 25-75 Pre: 0.36 L/sec
FEF2575-%Change-Post: -19 %
FEF2575-%Pred-Post: 36 %
FEF2575-%Pred-Pre: 45 %
FEV1-%Change-Post: -2 %
FEV1-%Pred-Post: 58 %
FEV1-%Pred-Pre: 59 %
FEV1-Post: 0.66 L
FEV1-Pre: 0.67 L
FEV1FVC-%Change-Post: 2 %
FEV1FVC-%Pred-Pre: 82 %
FEV6-%Change-Post: -6 %
FEV6-%Pred-Post: 71 %
FEV6-%Pred-Pre: 76 %
FEV6-Post: 1.03 L
FEV6-Pre: 1.1 L
FEV6FVC-%Change-Post: 0 %
FEV6FVC-%Pred-Post: 107 %
FEV6FVC-%Pred-Pre: 108 %
FVC-%Change-Post: -4 %
FVC-%Pred-Post: 67 %
FVC-%Pred-Pre: 70 %
FVC-Post: 1.07 L
FVC-Pre: 1.11 L
Post FEV1/FVC ratio: 62 %
Post FEV6/FVC ratio: 99 %
Pre FEV1/FVC ratio: 60 %
Pre FEV6/FVC Ratio: 100 %
RV % pred: 198 %
RV: 4.15 L
TLC % pred: 136 %
TLC: 5.35 L

## 2019-09-01 NOTE — Progress Notes (Signed)
Full PFT performed.

## 2019-09-01 NOTE — Assessment & Plan Note (Signed)
Tolerating maintenance nebulized medications  Plan: Continue Brovana nebulized meds Continue Yupelri nebulized meds

## 2019-09-01 NOTE — Assessment & Plan Note (Signed)
Plan: We will continue to monitor this clinically No acute worsening cough or increased congestion today

## 2019-09-01 NOTE — Patient Instructions (Addendum)
You were seen today by Lauraine Rinne, NP  for:   1. Centrilobular emphysema (HCC)  Continue Brovana nebulized meds twice daily  Continue Yupelri nebulized meds daily  Can use albuterol rescue inhaler or albuterol nebulized meds every 6-8 hours as needed for shortness of breath or wheezing  Work on increasing overall physical activity  Received second Covid vaccine as scheduled  We would recommend that you receive your Pneumovax 23 and pneumonia vaccine at your next office visit with Melissa Mcdaniel   Start trying to walk and physically exert yourself 5 to 10 minutes each day, add 1 minute to this each week Continue using your incentive spirometer Continue doing arm exercises   Follow Up:    Return in about 2 months (around 10/30/2019), or if symptoms worsen or fail to improve, for Follow up with Dr. Loanne Drilling.   Please do your part to reduce the spread of COVID-19:      Reduce your risk of any infection  and COVID19 by using the similar precautions used for avoiding the common cold or flu:  Marland Kitchen Wash your hands often with soap and warm water for at least 20 seconds.  If soap and water are not readily available, use an alcohol-based hand sanitizer with at least 60% alcohol.  . If coughing or sneezing, cover your mouth and nose by coughing or sneezing into the elbow areas of your shirt or coat, into a tissue or into your sleeve (not your hands). Langley Gauss A MASK when in public  . Avoid shaking hands with others and consider head nods or verbal greetings only. . Avoid touching your eyes, nose, or mouth with unwashed hands.  . Avoid close contact with people who are sick. . Avoid places or events with large numbers of people in one location, like concerts or sporting events. . If you have some symptoms but not all symptoms, continue to monitor at home and seek medical attention if your symptoms worsen. . If you are having a medical emergency, call 911.   [place] / e-Visit: [email or web address]         [facility]: [facility]: [phone]                   [facility]: [phone]     It is flu season:   >>> Best ways to protect herself from the flu: Receive the yearly flu vaccine, practice good hand hygiene washing with soap and also using hand sanitizer when available, eat a nutritious meals, get adequate rest, hydrate appropriately   Please contact the office if your symptoms worsen or you have concerns that you are not improving.   Thank you for choosing [facility] for your healthcare, and for allowing Melissa Mcdaniel to partner with you on your healthcare journey. I am thankful to be able to provide care to you today.   Wyn Quaker FNP-C

## 2019-09-01 NOTE — Assessment & Plan Note (Addendum)
Felt to not be able to mobilize DPI inhalers Intolerant of Stiolto Respimat Barrel chest COPD Gold stage II on PFTs today   Plan: Walk today in office Continue Brovana nebulized meds Continue Yupelri nebulized meds Start increasing physical activity Pneumovax 23 at next office visit Proceed forward with second Covid vaccine as scheduled

## 2019-09-07 ENCOUNTER — Telehealth: Payer: Self-pay | Admitting: Pulmonary Disease

## 2019-09-07 NOTE — Telephone Encounter (Signed)
Called and spoke to pt. Informed her of the recs per [name]. Walk scheduled for 2/16 at 2pm. Pt aware that if levels drop then she will need to be added to [facility] or APPs schedule.   Will forward to [place], CMA, as she will be performing walk with pt.

## 2019-09-07 NOTE — Telephone Encounter (Signed)
Called and spoke to pt. Pt states she had an episode of dizziness with hypoxia today after some daily activities. Pt's spO2 was 88% on RA, after rest pt recovered to low 90's. Pt was seen on 09/01/2019 by Wyn Quaker, NP, and a walk test was done but did not show desaturation. Pt states she is up for another walk test. Pt states she feels no different than when she was seen on 09/01/19, pt denies any new s/s.  [name] please advise if you would like pt to come in for another OV and walk or just walk test, and other recs for pt. Thanks.

## 2019-09-07 NOTE — Telephone Encounter (Signed)
Based off the information provided I will have additional recommendations.  Patient can always come in for a simple walk test for further evaluation.  If the patient has exertional hypoxemia then she can be scheduled to see me.  Okay to double book.If walk is clinically stable and no other changes then we can continue to clinically monitor.As always, if the patient would like to be evaluated sooner that is also perfectly okay please schedule.Wyn Quaker, FNP

## 2019-09-08 ENCOUNTER — Other Ambulatory Visit: Payer: Self-pay

## 2019-09-08 ENCOUNTER — Ambulatory Visit (INDEPENDENT_AMBULATORY_CARE_PROVIDER_SITE_OTHER): Payer: Medicare Other

## 2019-09-08 ENCOUNTER — Telehealth: Payer: Self-pay | Admitting: Pulmonary Disease

## 2019-09-08 DIAGNOSIS — J432 Centrilobular emphysema: Secondary | ICD-10-CM

## 2019-09-08 DIAGNOSIS — R0602 Shortness of breath: Secondary | ICD-10-CM | POA: Diagnosis not present

## 2019-09-08 NOTE — Telephone Encounter (Signed)
Patient has been scheduled for 09/17/19 at 2pm (patient and her husband preference). Did advise her to call us before then if she needed anything. She verbalized understanding.

## 2019-09-08 NOTE — Telephone Encounter (Signed)
09/08/2019  Patient came into our office today to complete a walk.  Upon starting the lung patient's oxygen levels at room air at rest were 87%.  Patient was placed on oxygen.  Patient required 3 L of O2 to maintain oxygen saturations above 90%.  SATURATION QUALIFICATIONS: (This note is used to comply with regulatory documentation for home oxygen)  Patient Saturations on Room Air at Rest = 87%  Patient Saturations on 3 Liters of oxygen while Ambulating = 92%  Please briefly explain why patient needs home oxygen: Maintain oxygen saturations above 88%.  Patient with COPD as well as a diffusion defect on her pulmonary function testing.   This will be a new oxygen start for the patient.  Since patient is in office we will also obtain chest x-ray imaging.  An order for that has been placed.  Patient will need to have a 1 week follow-up with our office.  I reviewed with the patient that she needs to maintain oxygen saturations above 88%.  She has a pulse oximeter at home.  We will place an order to [employer] to start her on oxygen.  Patient be discharged from our office today with an adapt tank.  Heart rate stable.  Breath sounds clear to auscultation.  Barrel chest limited physical exam.  Recent pulmonary function testing shows DLCO 41.  Wyn Quaker, FNP

## 2019-09-08 NOTE — Telephone Encounter (Signed)
09/08/2019   

## 2019-09-08 NOTE — Progress Notes (Signed)
Pt came in the office to be qualified for oxygen therapy.

## 2019-09-09 ENCOUNTER — Telehealth: Payer: Self-pay | Admitting: Pulmonary Disease

## 2019-09-09 NOTE — Telephone Encounter (Signed)
error 

## 2019-09-10 ENCOUNTER — Ambulatory Visit (INDEPENDENT_AMBULATORY_CARE_PROVIDER_SITE_OTHER): Payer: Medicare Other | Admitting: Pulmonary Disease

## 2019-09-10 ENCOUNTER — Encounter: Payer: Self-pay | Admitting: Pulmonary Disease

## 2019-09-10 DIAGNOSIS — J9611 Chronic respiratory failure with hypoxia: Secondary | ICD-10-CM

## 2019-09-10 DIAGNOSIS — J479 Bronchiectasis, uncomplicated: Secondary | ICD-10-CM

## 2019-09-10 DIAGNOSIS — R0602 Shortness of breath: Secondary | ICD-10-CM | POA: Insufficient documentation

## 2019-09-10 DIAGNOSIS — I4819 Other persistent atrial fibrillation: Secondary | ICD-10-CM

## 2019-09-10 DIAGNOSIS — J432 Centrilobular emphysema: Secondary | ICD-10-CM

## 2019-09-10 DIAGNOSIS — I5042 Chronic combined systolic (congestive) and diastolic (congestive) heart failure: Secondary | ICD-10-CM | POA: Diagnosis not present

## 2019-09-10 NOTE — Progress Notes (Signed)
Virtual Visit via Telephone Note  I connected with Melissa Mcdaniel on 09/10/19 at  1:30 PM EST by telephone and verified that I am speaking with the correct person using two identifiers.  Location: Patient: Home Provider: [facility] - [address], [place], [place], [place] [address]   I discussed the limitations, risks, security and privacy concerns of performing an evaluation and management service by telephone and the availability of in person appointments. I also discussed with the patient that there may be a patient responsible charge related to this service. The patient expressed understanding and agreed to proceed.  Patient consented to consult via telephone: Yes People present and their role in pt care: Pt     History of Present Illness: 86 year old female former smoker followed in our office for emphysema  PMH:  chronic diastolic heart failure, atrial fibrillation, hypertension, CKD, depression and recent pneumonia who presents for follow-up. Smoker/ Smoking History: Former smoker.  15-pack-year smoking history. Maintenance: Candiss Norse Pt of: Dr. Loanne Drilling  Chief complaint: 1 day follow-up  86 year old female former smoker followed in our office for emphysema and bronchiectasis.  This is a 2-day follow-up from when patient was walked in our office and found to require oxygen at rest as well as with physical exertion.  Patient has been started on oxygen and this was delivered by [employer] yesterday.  Patient spouse as well as patient report that she has been doing better since being started on oxygen.  She has even been more active.  Oxygen saturations are running between 92 and 95% on oxygen at home.  They are checking oxygen levels at rest with physical exertion as well as sporadically throughout the day.  At rest patient's oxygen saturations are between 88 to 90% on room air.  Patient remains adherent to Mcdaniel nebulized  medications.  Patient also with a history of bronchiectasis.  Chest x-ray earlier this week showed increased interstitial prominence.  Discussed this case with Dr. Loanne Drilling.  We would like to proceed forward with obtaining a high-resolution CT chest to further evaluate the chest x-ray.  This can be compared to her previous CT chest images from last year.  Patient also does not have an echocardiogram on file.  Patient was not volume overloaded at walk earlier this week or at exam last month.  Patient spouse confirms lower extremities are not swollen today over telephone.  We will proceed forward with ordering echocardiogram to further evaluate the dyspnea.  As we have discussed at other office visits patient does have advanced COPD.  Her FEV1 is less than a liter.  Her shortness of breath is multifactorial.  It is reassuring that she has had improved symptoms since starting oxygen.  We will continue have close follow-up with the patient.    Observations/Objective:  09/10/19 - SPO2 - 89 percent on RA  09/10/19 - SPO2 -92 to 95% on 3 L  07/24/2018-CT chest without contrast- Increased reticular nodular densities are noted in the right lower lobe most consistent with atypical inflammation or infection some bronchitis and mucous plugging may be present as well, stable reticular densities are noted in the right middle lobe and left lower lobe which may represent scarring or sequela from previous atypical inflammation, 6 mm nodules noted in the right upper lobe which is significantly enlarged compared to prior exam, also noted a stable 6 mm nodule in left upper lobe, noncontrast chest CT at 3 to 6 months is recommended  02/09/2019-CT chest  without contrast- no acute cardiopulmonary disease, chronic bilateral patchy reticular nodular pattern of opacification with areas of discrete nodularity and mild bronchiectatic change over the right lower lobe, findings not significantly changed from 02/05/2018, slightly  improved overall compared to 07/24/2018, findings likely due to chronic inflammatory or atypical infectious process,  03/31/2018-echocardiogram- LV ejection fraction 55 to 60%, PA pressure 35  09/01/2019-pulmonary function test-FVC 1.11 (70% predicted), postbronchodilator ratio 62, postbronchodilator FEV1 0.66 (58% predicted), no bronchodilator response, DLCO 5.81 (41% predicted)   Social History   Tobacco Use  Smoking Status Former Smoker  . Packs/day: 0.50  . Years: 30.00  . Pack years: 15.00  . Types: Cigarettes  . Start date: 57  . Quit date: 62  . Years since quitting: 41.1  Smokeless Tobacco Never Used   Immunization History  Administered Date(s) Administered  . PFIZER SARS-COV-2 Vaccination 08/27/2019      Assessment and Plan:  Chronic combined systolic and diastolic heart failure ([place]) Plan: Continue taking Lasix Echocardiogram ordered  Persistent atrial fibrillation ([place]) Plan: Continue Eliquis Continue to follow-up with cardiology Echocardiogram ordered  Bronchiectasis without complication ([place]) Suspect based off of x-ray earlier this week the patient may have worsened interval progression of mucous plugging and atypical MAI suspected infection as seen on CTs from 2020  Patient does not have a productive cough Patient does not have any fevers  Plan: We will proceed forward with high-resolution CT chest to further evaluate recent x-ray  Centrilobular emphysema ([place]) Felt to not be able to mobilize DPI inhalers Intolerant of Stiolto Respimat Barrel chest COPD Gold stage II on PFTs today  Plan: Continue Brovana nebulized meds Continue Yupelri nebulized meds Increase physical activity Continue forward with receiving Covid vaccine as scheduled Pneumovax 23 at next office visit Continue oxygen therapy to maintain oxygen saturations above 88%  FEV1 less than a liter on recent PFTs, continue dyspnea, likely will need to consider referral to palliative  care in the future to help patient with symptom management.  Chronic respiratory failure with hypoxia (HCC) Patient and patient spouse report that she has been more active and doing better since getting started on oxygen Oxygen saturations are running 88 to 90% on room air at rest Oxygen saturations with physical exertion are maintained at 92 to 95% on 3 L  Plan: Okay to take breaks from using oxygen if oxygen saturations are above 88% Continue to wear 3 L of O2 with physical exertion We will closely follow this in our office with walks Continue to check oxygen levels at home, goal oxygen saturations are 88 to 92% We will order echocardiogram and high-resolution CT chest to further evaluate new oxygen needs   Follow Up Instructions:  Return in about 4 weeks (around 10/08/2019), or if symptoms worsen or fail to improve, for Follow up with Wyn Quaker FNP-C, Follow up with Dr. Loanne Drilling.   I discussed the assessment and treatment plan with the patient. The patient was provided an opportunity to ask questions and all were answered. The patient agreed with the plan and demonstrated an understanding of the instructions.   The patient was advised to call back or seek an in-person evaluation if the symptoms worsen or if the condition fails to improve as anticipated.  I provided 24 minutes of non-face-to-face time during this encounter.   Lauraine Rinne, NP

## 2019-09-10 NOTE — Assessment & Plan Note (Signed)
Plan: Continue Eliquis Continue to follow-up with cardiology Echocardiogram ordered

## 2019-09-10 NOTE — Assessment & Plan Note (Signed)
Suspect based off of x-ray earlier this week the patient may have worsened interval progression of mucous plugging and atypical MAI suspected infection as seen on CTs from 2020  Patient does not have a productive cough Patient does not have any fevers  Plan: We will proceed forward with high-resolution CT chest to further evaluate recent x-ray

## 2019-09-10 NOTE — Assessment & Plan Note (Signed)
Patient and patient spouse report that she has been more active and doing better since getting started on oxygen Oxygen saturations are running 88 to 90% on room air at rest Oxygen saturations with physical exertion are maintained at 92 to 95% on 3 L  Plan: Okay to take breaks from using oxygen if oxygen saturations are above 88% Continue to wear 3 L of O2 with physical exertion We will closely follow this in our office with walks Continue to check oxygen levels at home, goal oxygen saturations are 88 to 92% We will order echocardiogram and high-resolution CT chest to further evaluate new oxygen needs

## 2019-09-10 NOTE — Assessment & Plan Note (Signed)
Plan: Continue taking Lasix Echocardiogram ordered

## 2019-09-10 NOTE — Assessment & Plan Note (Addendum)
Felt to not be able to mobilize DPI inhalers Intolerant of Stiolto Respimat Barrel chest COPD Gold stage II on PFTs today  Plan: Continue Brovana nebulized meds Continue Yupelri nebulized meds Increase physical activity Continue forward with receiving Covid vaccine as scheduled Pneumovax 23 at next office visit Continue oxygen therapy to maintain oxygen saturations above 88%  FEV1 less than a liter on recent PFTs, continue dyspnea, likely will need to consider referral to palliative care in the future to help patient with symptom management.

## 2019-09-10 NOTE — Patient Instructions (Addendum)
You were seen today by Lauraine Rinne, NP  for:   1. Shortness of breath  - CT Chest High Resolution; Future - ECHOCARDIOGRAM COMPLETE; Future  We will order a high-resolution CT of your chest to further evaluate your lungs and your recent x-ray  We will order an echocardiogram which is an ultrasound of your heart to further evaluate your shortness of breath  2. Centrilobular emphysema (HCC)  Continue Yupelri nebulized meds daily  Continue Brovana nebulized meds twice daily  Maintain oxygen saturations above 88%  3. Bronchiectasis without complication (HCC)   Bronchiectasis: This is the medical term which indicates that you have damage, dilated airways making you more susceptible to respiratory infection. Use a flutter valve 10 breaths twice a day or 4 to 5 breaths 4-5 times a day to help clear mucus out Let us know if you have cough with change in mucus color or fevers or chills.  At that point you would need an antibiotic. Maintain a healthy nutritious diet, eating whole foods Take your medications as prescribed   We will order high-resolution CT of your chest to further evaluate the x-ray obtained earlier this week  4. Chronic respiratory failure with hypoxia (HCC)  Goal oxygen saturations are between 88 to 92%  Continue oxygen therapy as prescribed  >>>maintain oxygen saturations greater than 88 percent  >>>if unable to maintain oxygen saturations please contact the office  >>>do not smoke with oxygen  >>>can use nasal saline gel or nasal saline rinses to moisturize nose if oxygen causes dryness  5. Chronic combined systolic and diastolic heart failure (HCC 6. Persistent atrial fibrillation (HCC)  Continue Eliquis  Proceed forward with echocardiogram  Continue to follow-up with cardiology   We recommend today:  Orders Placed This Encounter  Procedures  . CT Chest High Resolution    Standing Status:   Future    Standing Expiration Date:   11/07/2020   Scheduling Instructions:     Schedule within 1-2 weeks    Order Specific Question:   Preferred imaging location?    Answer:   [address]    Order Specific Question:   Radiology Contrast Protocol - do NOT remove file path    Answer:   [email or web address]  . ECHOCARDIOGRAM COMPLETE    Standing Status:   Future    Standing Expiration Date:   12/07/2020    Order Specific Question:   Where should this test be performed    Answer:   [facility]    Order Specific Question:   Does the patient weigh less than or greater than 250 lbs?    Answer:   Patient weighs less than 250 lbs    Order Specific Question:   Perflutren DEFINITY (image enhancing agent) should be administered unless hypersensitivity or allergy exist    Answer:   Administer Perflutren    Order Specific Question:   Reason for exam-Echo    Answer:   Dyspnea  786.09 / R06.00   Orders Placed This Encounter  Procedures  . CT Chest High Resolution  . ECHOCARDIOGRAM COMPLETE   No orders of the defined types were placed in this encounter.   Follow Up:    Return in about 4 weeks (around 10/08/2019), or if symptoms worsen or fail to improve, for Follow up with Wyn Quaker FNP-C, Follow up with Dr. Loanne Drilling.   Please do your part to reduce the spread of COVID-19:      Reduce your risk  of any infection  and COVID19 by using the similar precautions used for avoiding the common cold or flu:  Marland Kitchen Wash your hands often with soap and warm water for at least 20 seconds.  If soap and water are not readily available, use an alcohol-based hand sanitizer with at least 60% alcohol.  . If coughing or sneezing, cover your mouth and nose by coughing or sneezing into the elbow areas of your shirt or coat, into a tissue or into your sleeve (not your hands). Langley Gauss A MASK when in public  . Avoid shaking hands with others and consider head nods or verbal greetings only. . Avoid touching your  eyes, nose, or mouth with unwashed hands.  . Avoid close contact with people who are sick. . Avoid places or events with large numbers of people in one location, like concerts or sporting events. . If you have some symptoms but not all symptoms, continue to monitor at home and seek medical attention if your symptoms worsen. . If you are having a medical emergency, call 911.   [place] / e-Visit: [email or web address]         [facility]: [facility]: [phone]                   [facility]: [phone]     It is flu season:   >>> Best ways to protect herself from the flu: Receive the yearly flu vaccine, practice good hand hygiene washing with soap and also using hand sanitizer when available, eat a nutritious meals, get adequate rest, hydrate appropriately   Please contact the office if your symptoms worsen or you have concerns that you are not improving.   Thank you for choosing [facility] for your healthcare, and for allowing [place] to partner with you on your healthcare journey. I am thankful to be able to provide care to you today.   Wyn Quaker FNP-C

## 2019-09-13 NOTE — Progress Notes (Signed)
Discussed over televisit last week. Discuss with Dr. Loanne Drilling. Will order HRCT to further evaluate.   Wyn Quaker FNP

## 2019-09-14 ENCOUNTER — Telehealth: Payer: Self-pay | Admitting: Pulmonary Disease

## 2019-09-14 DIAGNOSIS — H401122 Primary open-angle glaucoma, left eye, moderate stage: Secondary | ICD-10-CM | POA: Diagnosis not present

## 2019-09-14 DIAGNOSIS — Z961 Presence of intraocular lens: Secondary | ICD-10-CM | POA: Diagnosis not present

## 2019-09-14 DIAGNOSIS — H401111 Primary open-angle glaucoma, right eye, mild stage: Secondary | ICD-10-CM | POA: Diagnosis not present

## 2019-09-14 NOTE — Telephone Encounter (Signed)
Called patient to get her scheduled for a follow up after her echo and CT. Her echo is scheduled for 09/24/19 at 250pm. CT scan scheduled for 09/15/19 at 130pm.   She did not answer and her VM was full. Will attempt to call back this afternoon.

## 2019-09-15 ENCOUNTER — Other Ambulatory Visit: Payer: Self-pay

## 2019-09-15 ENCOUNTER — Ambulatory Visit (HOSPITAL_BASED_OUTPATIENT_CLINIC_OR_DEPARTMENT_OTHER)
Admission: RE | Admit: 2019-09-15 | Discharge: 2019-09-15 | Disposition: A | Discharge status: Home / Self Care | Payer: Medicare Other | Source: Ambulatory Visit | Attending: Pulmonary Disease | Admitting: Pulmonary Disease

## 2019-09-15 DIAGNOSIS — R0602 Shortness of breath: Secondary | ICD-10-CM | POA: Insufficient documentation

## 2019-09-17 ENCOUNTER — Other Ambulatory Visit: Payer: Self-pay

## 2019-09-17 ENCOUNTER — Ambulatory Visit (INDEPENDENT_AMBULATORY_CARE_PROVIDER_SITE_OTHER): Payer: Medicare Other | Admitting: Pulmonary Disease

## 2019-09-17 ENCOUNTER — Encounter: Payer: Self-pay | Admitting: Pulmonary Disease

## 2019-09-17 VITALS — BP 118/80 | HR 76 | Temp 98.4°F | Ht <= 58 in | Wt 93.0 lb

## 2019-09-17 DIAGNOSIS — J9611 Chronic respiratory failure with hypoxia: Secondary | ICD-10-CM

## 2019-09-17 DIAGNOSIS — R918 Other nonspecific abnormal finding of lung field: Secondary | ICD-10-CM | POA: Insufficient documentation

## 2019-09-17 DIAGNOSIS — J432 Centrilobular emphysema: Secondary | ICD-10-CM

## 2019-09-17 DIAGNOSIS — E440 Moderate protein-calorie malnutrition: Secondary | ICD-10-CM

## 2019-09-17 DIAGNOSIS — I4819 Other persistent atrial fibrillation: Secondary | ICD-10-CM

## 2019-09-17 DIAGNOSIS — J479 Bronchiectasis, uncomplicated: Secondary | ICD-10-CM

## 2019-09-17 MED ORDER — FLUTTER DEVI: 1.0000 | 0 refills | Status: AC | PRN

## 2019-09-17 NOTE — Assessment & Plan Note (Signed)
Plan: Made suggestions the patient should increase protein intake Suggested Ensure, boost or Carnation breakfast, patient declines these today reporting that she has tried them in she cannot tolerate them  Patient following up with dentist to ensure she gets adequate dentures made to hopefully improve p.o. intake

## 2019-09-17 NOTE — Assessment & Plan Note (Signed)
Felt to be unable to mobilize DPI inhalers Intolerant of Stiolto Respimat Barrel chest COPD Gold stage II on pulmonary function test in February/2021  Plan: Continue Brovana nebulized meds Continue Yupelri nebulized meds Increase physical activity

## 2019-09-17 NOTE — H&P (View-Only) (Signed)
_0  ID: Melissa Mcdaniel, female    DOB: April 22, 1933, 86 y.o.   MRN: [id number]  Chief Complaint  Patient presents with  . Follow-up    1wk f/u. States her nose feels stopped up. As a result, she has been breathing through her mouth more.     Referring provider: Harlan Stains, MD  HPI:  86 year old female former smoker followed in our office for emphysema  PMH:  chronic diastolic heart failure, atrial fibrillation, hypertension, CKD, depression and recent pneumonia who presents for follow-up. Smoker/ Smoking History: Former smoker.  15-pack-year smoking history. Maintenance: Candiss Norse Pt of: Dr. Loanne Drilling  09/17/2019  - Visit   86 year old female former smoker followed in our office for emphysema as well as bronchiectasis.  Patient completing 1 week follow-up with our office after recently being started on oxygen last week.  Patient is maintained on 3 L with physical exertion.  Patient reports that she feels that she breathes better on the oxygen.  Walk today in office reveals patient is stable with no oxygen desaturations on 2 L.  Patient also recently completed a high-resolution CT chest.  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  They are scheduled to complete an echocardiogram next month.  Questionaires / Pulmonary Flowsheets:   MMRC: mMRC Dyspnea Scale mMRC Score  09/17/2019 1  09/01/2019 3     Tests:    07/24/2018-CT chest without contrast- Increased reticular nodular densities are noted in the right lower lobe most consistent with atypical inflammation or infection some bronchitis and mucous plugging may be present as well, stable reticular densities are noted in the right middle lobe and left lower lobe which may represent scarring or sequela from previous atypical inflammation, 6 mm nodules noted in the right upper lobe which is significantly enlarged compared to prior exam, also noted a stable 6 mm nodule in left upper lobe, noncontrast chest CT at 3 to 6 months is recommended  02/09/2019-CT chest without contrast- no acute cardiopulmonary disease, chronic bilateral patchy reticular nodular pattern of opacification with areas of discrete nodularity and mild bronchiectatic change over the right lower lobe, findings not significantly changed from 02/05/2018, slightly improved overall compared to 07/24/2018, findings likely due to chronic inflammatory or atypical infectious process,  03/31/2018-echocardiogram- LV ejection fraction 55 to 60%, PA pressure 35  09/01/2019-pulmonary function test-FVC 1.11 (70% predicted), postbronchodilator ratio 62, postbronchodilator FEV1 0.66 (58% predicted), no bronchodilator response, DLCO 5.81 (41% predicted)  09/15/2019-high-resolution CT chest-slight progression in mid and lower lung zone predominant peribronchial vascular nodularity and consolidation with associated bronchiectasis and peribronchial thickening, findings most indicative of MAC, separate area of new nodularity in the right upper lobe likely due to atypical infection, malignancy cannot be excluded, follow-up CT chest without contrast in 3 months recommended, no evidence of ILD, mild air trapping indicative of small airways disease, aortic arthrosclerosis, diffuse hepatic hyperattenuation consistent with amiodarone therapy, emphysema  FENO:  No results found for: NITRICOXIDE  PFT: PFT Results  Latest Ref Rng & Units 09/01/2019  FVC-Pre L 1.11  FVC-Predicted Pre % 70  FVC-Post L 1.07  FVC-Predicted Post % 67  Pre FEV1/FVC % % 60  Post FEV1/FCV % % 62  FEV1-Pre L 0.67  FEV1-Predicted Pre % 59  FEV1-Post L 0.66  DLCO UNC% % 41  DLCO COR %Predicted % 76  TLC L 5.35  TLC % Predicted % 136  RV % Predicted % 198    WALK:  SIX MIN WALK 09/17/2019 09/01/2019 03/06/2019 02/16/2019  Supplimental Oxygen during Test? (L/min) No No No No  Tech Comments: Patient did desat immediately after coming from a sitting to standing position. She dropped down to 87% but quickly recovered in 15 secs to 96%. She was able to complete 1 lap at a slow pace due to unsteady pace. She was able tolerate 2L during entire walk. She was fatigued after her walk but denied any SOB, chest pain or leg pain. Patient was able to complete 1 lap at a slow pace. She had to walk slowly due to lower back pain and her being unsteady on her feet. She denied any SOB, chest pain or leg pain at the end of the walk. No O2 needed during or after walk. Patient was able to complete walk. She walked at a slow pace due to having hip pain. Declined having any chest pain. No O2 was not needed during or after walk. patient stopped during lap 2 to catch breath. sats dropped to 90 but came back up with some breathing. Patient's legs were hurting only completed 2 laps. patient was slightly dizzy and short of breath toward end of lap 2. patient walked and a moderate pace    Imaging: DG Chest 2 View  Result Date: 09/08/2019 CLINICAL DATA:  Shortness of breath. Emphysema. Congestive heart failure. EXAM: CHEST - 2 VIEW COMPARISON:  05/21/2018 FINDINGS: Mild cardiomegaly stable. Aortic atherosclerosis incidentally noted. Pulmonary hyperinflation is again seen, consistent with COPD. Increased coarsening of interstitial lung markings is seen bilaterally, consistent with progressive interstitial lung disease. No evidence of pulmonary consolidation or pleural  effusion. A midthoracic vertebral body compression fracture is new since prior exam in 2019. IMPRESSION: 1. Interval progression of chronic interstitial lung disease. No evidence of pulmonary consolidation or pleural effusion. 2. Stable mild cardiomegaly and COPD. 3. Midthoracic vertebral body compression fracture, new since 2019 exam. Electronically Signed   By: Marlaine Hind M.D.   On: 09/08/2019 21:54   CT Chest High Resolution  Result Date: 09/16/2019 CLINICAL DATA:  Shortness of breath. EXAM: CT CHEST WITHOUT CONTRAST TECHNIQUE: Multidetector CT imaging of the chest was performed following the standard protocol without intravenous contrast. High resolution imaging of the lungs, as well as inspiratory and expiratory imaging, was performed. COMPARISON:  05/06/2019, 02/09/2019, 07/24/2018, 02/05/2018 and 07/05/2017. FINDINGS: Cardiovascular: Atherosclerotic calcification of the aorta, aortic valve and coronary arteries. Heart is mildly enlarged. No pericardial effusion. Mediastinum/Nodes: 10 mm low-attenuation left thyroid nodule, as before. No followup recommended (ref: J Am Coll Radiol. 2015 Feb;12(2): 143-50).Low left internal jugular lymph node measures 7 mm. Mediastinal lymph nodes are not enlarged by CT size criteria. Hilar regions are difficult to evaluate without IV contrast. No axillary adenopathy. Esophagus is grossly unremarkable. Lungs/Pleura: Biapical pleuroparenchymal scarring. Centrilobular emphysema. Calcified granulomas. Mid and lower lung zone predominant peribronchial thickening, bronchiectasis and peribronchovascular nodularity/consolidation, progressive from 05/06/2019. There is a new area of focal nodularity in the anterior right upper lobe measuring 9 mm (3/40). No subpleural reticulation, traction bronchiectasis/bronchiolectasis, ground-glass, architectural distortion or honeycombing. Resolved right pleural effusion. Airway is  unremarkable. Mild air trapping. Upper Abdomen: Liver is  increased in attenuation diffusely. Visualized portions of the adrenal glands and right kidney are unremarkable. Left kidney is atrophic and contains a subcentimeter low-attenuation lesion, too small to characterize. Visualized portions of the spleen pancreas, stomach and bowel are otherwise unremarkable. Musculoskeletal: Degenerative changes in the spine. Increasing compression of the T8 vertebral body. Other thoracolumbar compression deformities are stable. Old sternal fracture. No worrisome lytic or sclerotic lesions. IMPRESSION: 1. Slight progression in mid and lower lung zone predominant peribronchovascular nodularity and consolidation with associated bronchiectasis and peribronchial thickening, findings most indicative of mycobacterium avium complex. 2. Separate area of new nodularity in the right upper lobe, likely also due to 8 typical infection. Malignancy cannot be excluded. Continued attention on follow-up exams or follow-up CT chest without contrast in 3 months is recommended. 3. No evidence of fibrotic interstitial lung disease. Mild air trapping is indicative of small airways disease. 4. Diffuse hepatic hyper attenuation, consistent with amiodarone therapy. 5. Aortic atherosclerosis (ICD10-I70.0). Coronary artery calcification. 6.  Emphysema (ICD10-J43.9). Electronically Signed   By: Lorin Picket M.D.   On: 09/16/2019 10:46    Lab Results:  CBC    Component Value Date/Time   WBC 5.0 05/06/2019 1240   RBC 3.29 (L) 05/06/2019 1240   HGB 9.4 (L) 05/06/2019 1240   HGB 10.0 (L) 01/30/2019 1351   HCT 30.1 (L) 05/06/2019 1240   HCT 31.5 (L) 01/30/2019 1351   PLT 209 05/06/2019 1240   PLT 243 01/30/2019 1351   MCV 91.5 05/06/2019 1240   MCV 86 01/30/2019 1351   MCH 28.6 05/06/2019 1240   MCHC 31.2 05/06/2019 1240   RDW 14.2 05/06/2019 1240   RDW 13.0 01/30/2019 1351   LYMPHSABS 0.8 04/15/2019 1545   LYMPHSABS 0.8 01/30/2019 1351   MONOABS 0.5 04/15/2019 1545   EOSABS 0.2 04/15/2019  1545   EOSABS 0.1 01/30/2019 1351   BASOSABS 0.1 04/15/2019 1545   BASOSABS 0.1 01/30/2019 1351    BMET    Component Value Date/Time   NA 140 08/26/2019 1123   NA 137 01/30/2019 1351   K 5.6 (H) 08/26/2019 1123   CL 111 08/26/2019 1123   CO2 20 (L) 08/26/2019 1123   GLUCOSE 88 08/26/2019 1123   BUN 26 (H) 08/26/2019 1123   BUN 23 01/30/2019 1351   CREATININE 1.93 (H) 08/26/2019 1123   CALCIUM 8.7 (L) 08/26/2019 1123   GFRNONAA 23 (L) 08/26/2019 1123   GFRAA 27 (L) 08/26/2019 1123    BNP    Component Value Date/Time   BNP 367.9 (H) 03/29/2018 0627    ProBNP    Component Value Date/Time   PROBNP 270.0 (H) 04/15/2019 1545    Specialty Problems      Pulmonary Problems   Bronchiectasis without complication (HCC)   Centrilobular emphysema (HCC)   Lung nodule, multiple   Chronic respiratory failure with hypoxia (HCC)   Shortness of breath      Allergies  Allergen Reactions  . Penicillins Swelling    Did it involve swelling of the face/tongue/throat, SOB, or low BP? Yes Did it involve sudden or severe rash/hives, skin peeling, or any reaction on the inside of your mouth or nose? No Did you need to seek medical attention at a hospital or doctor's office? No When did it last happen?childhood allergy If all above answers are "NO", may proceed with cephalosporin use.   . Levothyroxine Nausea And Vomiting    Immunization History  Administered Date(s) Administered  .  PFIZER SARS-COV-2 Vaccination 08/27/2019    Past Medical History:  Diagnosis Date  . A-fib ([place])   . Chronic kidney disease   . Congestive heart failure (CHF) ([place]) 05/2016  . Crohn's disease ([place])   . Dysrhythmia    afib dx approx. 2016 per pt  . Hip fracture ([place])   . Hypertension     Tobacco History: Social History   Tobacco Use  Smoking Status Former Smoker  . Packs/day: 0.50  . Years: 30.00  . Pack years: 15.00  . Types: Cigarettes  . Start date: 82  . Quit date: 39    . Years since quitting: 41.1  Smokeless Tobacco Never Used   Counseling given: Yes   Continue to not smoke  Outpatient Encounter Medications as of 09/17/2019  Medication Sig  . acetaminophen (TYLENOL) 500 MG tablet Take 1,000 mg by mouth daily as needed for moderate pain or headache.  . alendronate (FOSAMAX) 70 MG tablet Take 70 mg by mouth every Saturday. Take with a full glass of water on an empty stomach.  Marland Kitchen amiodarone (PACERONE) 200 MG tablet Take 1 tablet (200 mg total) by mouth daily.  Marland Kitchen apixaban (ELIQUIS) 2.5 MG TABS tablet Take 1 tablet (2.5 mg total) by mouth 2 (two) times daily.  Marland Kitchen arformoterol (BROVANA) 15 MCG/2ML NEBU Take 2 mLs (15 mcg total) by nebulization 2 (two) times daily.  . carvedilol (COREG) 12.5 MG tablet Take 12.5 mg by mouth 2 (two) times daily with a meal.  . Cholecalciferol (VITAMIN D3) 2000 units TABS Take 2,000 Units by mouth daily.   . clonazePAM (KLONOPIN) 0.5 MG tablet Take 0.5 mg by mouth 2 (two) times daily.  . Ferrous Sulfate (IRON) 28 MG TABS Take 28 mg by mouth daily.   . furosemide (LASIX) 20 MG tablet Take 20 mg by mouth daily as needed for edema.   . hydrALAZINE (APRESOLINE) 25 MG tablet Take 25 mg by mouth 2 (two) times daily.   Marland Kitchen latanoprost (XALATAN) 0.005 % ophthalmic solution Place 1 drop into both eyes at bedtime.  Marland Kitchen loperamide (IMODIUM) 2 MG capsule Take 4 mg by mouth 4 (four) times daily as needed for diarrhea or loose stools.  . mirtazapine (REMERON) 15 MG tablet Take 15 mg by mouth at bedtime.   . potassium chloride (K-DUR) 10 MEQ tablet Take 10 mEq by mouth daily as needed (when taking lasix).   . Revefenacin (YUPELRI) 175 MCG/3ML SOLN Inhale 175 mcg into the lungs daily.  Marland Kitchen venlafaxine XR (EFFEXOR-XR) 150 MG 24 hr capsule Take 150 mg by mouth 2 (two) times daily.  Marland Kitchen Respiratory Therapy Supplies (FLUTTER) DEVI 1 Device by Does not apply route as needed.   No facility-administered encounter medications on file as of 09/17/2019.      Review of Systems  Review of Systems  Constitutional: Positive for appetite change, fatigue and unexpected weight change. Negative for activity change and fever.  HENT: Negative for sinus pressure, sinus pain and sore throat.   Respiratory: Positive for shortness of breath. Negative for cough and wheezing.   Cardiovascular: Negative for chest pain and palpitations.  Gastrointestinal: Negative for diarrhea, nausea and vomiting.  Musculoskeletal: Negative for arthralgias.  Neurological: Negative for dizziness.  Psychiatric/Behavioral: Negative for sleep disturbance. The patient is not nervous/anxious.      Physical Exam  BP 118/80   Pulse 76   Temp 98.4 F (36.9 C) (Temporal)   Ht 4' 8.5" (1.435 m)   Wt 93 lb (42.2 kg)  SpO2 98% Comment: on 3L  BMI 20.48 kg/m   Wt Readings from Last 5 Encounters:  09/17/19 93 lb (42.2 kg)  09/01/19 95 lb 6.4 oz (43.3 kg)  08/26/19 96 lb 3.2 oz (43.6 kg)  07/04/19 99 lb (44.9 kg)  06/26/19 101 lb (45.8 kg)    BMI Readings from Last 5 Encounters:  09/17/19 20.48 kg/m  09/01/19 21.01 kg/m  08/26/19 18.79 kg/m  07/04/19 19.33 kg/m  06/26/19 19.73 kg/m     Physical Exam Vitals and nursing note reviewed.  Constitutional:      General: She is not in acute distress.    Appearance: Normal appearance. She is normal weight.  HENT:     Head: Normocephalic and atraumatic.     Right Ear: External ear normal.     Left Ear: External ear normal.     Nose: Nose normal. No congestion.     Mouth/Throat:     Mouth: Mucous membranes are moist.     Pharynx: Oropharynx is clear.  Eyes:     Pupils: Pupils are equal, round, and reactive to light.  Cardiovascular:     Rate and Rhythm: Normal rate and regular rhythm.     Pulses: Normal pulses.     Heart sounds: Normal heart sounds. No murmur.  Pulmonary:     Effort: Pulmonary effort is normal. No respiratory distress.     Breath sounds: No decreased air movement. Rales (left basilar )  present. No decreased breath sounds or wheezing.  Musculoskeletal:     Cervical back: Normal range of motion.     Right lower leg: No edema.     Left lower leg: No edema.  Skin:    General: Skin is warm and dry.     Capillary Refill: Capillary refill takes less than 2 seconds.  Neurological:     General: No focal deficit present.     Mental Status: She is alert and oriented to person, place, and time. Mental status is at baseline.     Gait: Gait normal.  Psychiatric:        Mood and Affect: Mood is anxious.        Behavior: Behavior normal.        Thought Content: Thought content normal.        Judgment: Judgment normal.       Assessment & Plan:   Persistent atrial fibrillation (HCC) Plan: Continue Eliquis Continue follow-up with cardiology Complete echocardiogram is ordered  Bronchiectasis without complication (HCC) Confirmed interval progression of patient's mucous plugging on high-resolution CT chest this week Favoring atypical infection such as MAI Patient unable to produce sputum samples Patient afebrile  Discussion: Spoke with Dr. Loanne Drilling.  She agrees the patient needs to proceed forward with bronchoscopy.  We will work to try to get the patient scheduled next week with Dr. Loanne Drilling.   Plan: We will coordinate an outpatient bronchoscopy next week for the patient   Centrilobular emphysema ([place]) Felt to be unable to mobilize DPI inhalers Intolerant of Stiolto Respimat Barrel chest COPD Gold stage II on pulmonary function test in February/2021  Plan: Continue Brovana nebulized meds Continue Yupelri nebulized meds Increase physical activity   Chronic respiratory failure with hypoxia (HCC) Plan: Continue oxygen therapie Pain oxygen saturations between 88 to 92% Walk today in office Revealed you required 2 L of O2 with physical exertion   Malnutrition of moderate degree Plan: Made suggestions the patient should increase protein intake Suggested Ensure,  boost or Carnation breakfast, patient  declines these today reporting that she has tried them in she cannot tolerate them  Patient following up with dentist to ensure she gets adequate dentures made to hopefully improve p.o. intake    Return in about 2 weeks (around 10/01/2019), or if symptoms worsen or fail to improve, for Follow up with Dr. Loanne Drilling.   Lauraine Rinne, NP 09/17/2019   This appointment required 42 minutes of patient care (this includes precharting, chart review, review of results, face-to-face care, etc.).

## 2019-09-17 NOTE — Progress Notes (Signed)
_0  ID: Melissa Mcdaniel, female    DOB: [birth year], 86 y.o.   MRN: [id number]  Chief Complaint  Patient presents with   Follow-up    1wk f/u. States her nose feels stopped up. As a result, she has been breathing through her mouth more.     Referring provider: Harlan Stains, MD  HPI:  86 year old female former smoker followed in our office for emphysema  PMH:  chronic diastolic heart failure, atrial fibrillation, hypertension, CKD, depression and recent pneumonia who presents for follow-up. Smoker/ Smoking History: Former smoker.  15-pack-year smoking history. Maintenance: Candiss Norse Pt of: Dr. Loanne Drilling  09/17/2019  - Visit   86 year old female former smoker followed in our office for emphysema as well as bronchiectasis.  Patient completing 1 week follow-up with our office after recently being started on oxygen last week.  Patient is maintained on 3 L with physical exertion.  Patient reports that she feels that she breathes better on the oxygen.  Walk today in office reveals patient is stable with no oxygen desaturations on 2 L.  Patient also recently completed a high-resolution CT chest.  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  They are scheduled to complete an echocardiogram next month.  Questionaires / Pulmonary Flowsheets:   MMRC: mMRC Dyspnea Scale mMRC Score  09/17/2019 1  09/01/2019 3     Tests:    07/24/2018-CT chest without contrast- Increased reticular nodular densities are noted in the right lower lobe most consistent with atypical inflammation or infection some bronchitis and mucous plugging may be present as well, stable reticular densities are noted in the right middle lobe and left lower lobe which may represent scarring or sequela from previous atypical inflammation, 6 mm nodules noted in the right upper lobe which is significantly enlarged compared to prior exam, also noted a stable 6 mm nodule in left upper lobe, noncontrast chest CT at 3 to 6 months is recommended  02/09/2019-CT chest without contrast- no acute cardiopulmonary disease, chronic bilateral patchy reticular nodular pattern of opacification with areas of discrete nodularity and mild bronchiectatic change over the right lower lobe, findings not significantly changed from 02/05/2018, slightly improved overall compared to 07/24/2018, findings likely due to chronic inflammatory or atypical infectious process,  03/31/2018-echocardiogram- LV ejection fraction 55 to 60%, PA pressure 35  09/01/2019-pulmonary function test-FVC 1.11 (70% predicted), postbronchodilator ratio 62, postbronchodilator FEV1 0.66 (58% predicted), no bronchodilator response, DLCO 5.81 (41% predicted)  09/15/2019-high-resolution CT chest-slight progression in mid and lower lung zone predominant peribronchial vascular nodularity and consolidation with associated bronchiectasis and peribronchial thickening, findings most indicative of MAC, separate area of new nodularity in the right upper lobe likely due to atypical infection, malignancy cannot be excluded, follow-up CT chest without contrast in 3 months recommended, no evidence of ILD, mild air trapping indicative of small airways disease, aortic arthrosclerosis, diffuse hepatic hyperattenuation consistent with amiodarone therapy, emphysema  FENO:  No results found for: NITRICOXIDE  PFT: PFT Results  Latest Ref Rng & Units 09/01/2019  FVC-Pre L 1.11  FVC-Predicted Pre % 70  FVC-Post L 1.07  FVC-Predicted Post % 67  Pre FEV1/FVC % % 60  Post FEV1/FCV % % 62  FEV1-Pre L 0.67  FEV1-Predicted Pre % 59  FEV1-Post L 0.66  DLCO UNC% % 41  DLCO COR %Predicted % 76  TLC L 5.35  TLC % Predicted % 136  RV % Predicted % 198    WALK:  SIX MIN WALK 09/17/2019 09/01/2019 03/06/2019 02/16/2019  Supplimental Oxygen during Test? (L/min) No No No No  Tech Comments: Patient did desat immediately after coming from a sitting to standing position. She dropped down to 87% but quickly recovered in 15 secs to 96%. She was able to complete 1 lap at a slow pace due to unsteady pace. She was able tolerate 2L during entire walk. She was fatigued after her walk but denied any SOB, chest pain or leg pain. Patient was able to complete 1 lap at a slow pace. She had to walk slowly due to lower back pain and her being unsteady on her feet. She denied any SOB, chest pain or leg pain at the end of the walk. No O2 needed during or after walk. Patient was able to complete walk. She walked at a slow pace due to having hip pain. Declined having any chest pain. No O2 was not needed during or after walk. patient stopped during lap 2 to catch breath. sats dropped to 90 but came back up with some breathing. Patient's legs were hurting only completed 2 laps. patient was slightly dizzy and short of breath toward end of lap 2. patient walked and a moderate pace    Imaging: DG Chest 2 View  Result Date: 09/08/2019 CLINICAL DATA:  Shortness of breath. Emphysema. Congestive heart failure. EXAM: CHEST - 2 VIEW COMPARISON:  05/21/2018 FINDINGS: Mild cardiomegaly stable. Aortic atherosclerosis incidentally noted. Pulmonary hyperinflation is again seen, consistent with COPD. Increased coarsening of interstitial lung markings is seen bilaterally, consistent with progressive interstitial lung disease. No evidence of pulmonary consolidation or pleural  effusion. A midthoracic vertebral body compression fracture is new since prior exam in 2019. IMPRESSION: 1. Interval progression of chronic interstitial lung disease. No evidence of pulmonary consolidation or pleural effusion. 2. Stable mild cardiomegaly and COPD. 3. Midthoracic vertebral body compression fracture, new since 2019 exam. Electronically Signed   By: Marlaine Hind M.D.   On: 09/08/2019 21:54   CT Chest High Resolution  Result Date: 09/16/2019 CLINICAL DATA:  Shortness of breath. EXAM: CT CHEST WITHOUT CONTRAST TECHNIQUE: Multidetector CT imaging of the chest was performed following the standard protocol without intravenous contrast. High resolution imaging of the lungs, as well as inspiratory and expiratory imaging, was performed. COMPARISON:  05/06/2019, 02/09/2019, 07/24/2018, 02/05/2018 and 07/05/2017. FINDINGS: Cardiovascular: Atherosclerotic calcification of the aorta, aortic valve and coronary arteries. Heart is mildly enlarged. No pericardial effusion. Mediastinum/Nodes: 10 mm low-attenuation left thyroid nodule, as before. No followup recommended (ref: J Am Coll Radiol. 2015 Feb;12(2): 143-50).Low left internal jugular lymph node measures 7 mm. Mediastinal lymph nodes are not enlarged by CT size criteria. Hilar regions are difficult to evaluate without IV contrast. No axillary adenopathy. Esophagus is grossly unremarkable. Lungs/Pleura: Biapical pleuroparenchymal scarring. Centrilobular emphysema. Calcified granulomas. Mid and lower lung zone predominant peribronchial thickening, bronchiectasis and peribronchovascular nodularity/consolidation, progressive from 05/06/2019. There is a new area of focal nodularity in the anterior right upper lobe measuring 9 mm (3/40). No subpleural reticulation, traction bronchiectasis/bronchiolectasis, ground-glass, architectural distortion or honeycombing. Resolved right pleural effusion. Airway is  unremarkable. Mild air trapping. Upper Abdomen: Liver is  increased in attenuation diffusely. Visualized portions of the adrenal glands and right kidney are unremarkable. Left kidney is atrophic and contains a subcentimeter low-attenuation lesion, too small to characterize. Visualized portions of the spleen pancreas, stomach and bowel are otherwise unremarkable. Musculoskeletal: Degenerative changes in the spine. Increasing compression of the T8 vertebral body. Other thoracolumbar compression deformities are stable. Old sternal fracture. No worrisome lytic or sclerotic lesions. IMPRESSION: 1. Slight progression in mid and lower lung zone predominant peribronchovascular nodularity and consolidation with associated bronchiectasis and peribronchial thickening, findings most indicative of mycobacterium avium complex. 2. Separate area of new nodularity in the right upper lobe, likely also due to 8 typical infection. Malignancy cannot be excluded. Continued attention on follow-up exams or follow-up CT chest without contrast in 3 months is recommended. 3. No evidence of fibrotic interstitial lung disease. Mild air trapping is indicative of small airways disease. 4. Diffuse hepatic hyper attenuation, consistent with amiodarone therapy. 5. Aortic atherosclerosis (ICD10-I70.0). Coronary artery calcification. 6.  Emphysema (ICD10-J43.9). Electronically Signed   By: Lorin Picket M.D.   On: 09/16/2019 10:46    Lab Results:  CBC    Component Value Date/Time   WBC 5.0 05/06/2019 1240   RBC 3.29 (L) 05/06/2019 1240   HGB 9.4 (L) 05/06/2019 1240   HGB 10.0 (L) 01/30/2019 1351   HCT 30.1 (L) 05/06/2019 1240   HCT 31.5 (L) 01/30/2019 1351   PLT 209 05/06/2019 1240   PLT 243 01/30/2019 1351   MCV 91.5 05/06/2019 1240   MCV 86 01/30/2019 1351   MCH 28.6 05/06/2019 1240   MCHC 31.2 05/06/2019 1240   RDW 14.2 05/06/2019 1240   RDW 13.0 01/30/2019 1351   LYMPHSABS 0.8 04/15/2019 1545   LYMPHSABS 0.8 01/30/2019 1351   MONOABS 0.5 04/15/2019 1545   EOSABS 0.2 04/15/2019  1545   EOSABS 0.1 01/30/2019 1351   BASOSABS 0.1 04/15/2019 1545   BASOSABS 0.1 01/30/2019 1351    BMET    Component Value Date/Time   NA 140 08/26/2019 1123   NA 137 01/30/2019 1351   K 5.6 (H) 08/26/2019 1123   CL 111 08/26/2019 1123   CO2 20 (L) 08/26/2019 1123   GLUCOSE 88 08/26/2019 1123   BUN 26 (H) 08/26/2019 1123   BUN 23 01/30/2019 1351   CREATININE 1.93 (H) 08/26/2019 1123   CALCIUM 8.7 (L) 08/26/2019 1123   GFRNONAA 23 (L) 08/26/2019 1123   GFRAA 27 (L) 08/26/2019 1123    BNP    Component Value Date/Time   BNP 367.9 (H) 03/29/2018 0627    ProBNP    Component Value Date/Time   PROBNP 270.0 (H) 04/15/2019 1545    Specialty Problems      Pulmonary Problems   Bronchiectasis without complication (HCC)   Centrilobular emphysema (HCC)   Lung nodule, multiple   Chronic respiratory failure with hypoxia (HCC)   Shortness of breath      Allergies  Allergen Reactions   Penicillins Swelling    Did it involve swelling of the face/tongue/throat, SOB, or low BP? Yes Did it involve sudden or severe rash/hives, skin peeling, or any reaction on the inside of your mouth or nose? No Did you need to seek medical attention at a hospital or doctor's office? No When did it last happen?childhood allergy If all above answers are NO, may proceed with cephalosporin use.    Levothyroxine Nausea And Vomiting    Immunization History  Administered Date(s) Administered  PFIZER SARS-COV-2 Vaccination 08/27/2019    Past Medical History:  Diagnosis Date   A-fib [facility])    Chronic kidney disease    Congestive heart failure (CHF) ([place]) 05/2016   Crohn's disease ([place])    Dysrhythmia    afib dx approx. 2016 per pt   Hip fracture ([place])    Hypertension     Tobacco History: Social History   Tobacco Use  Smoking Status Former Smoker   Packs/day: 0.50   Years: 30.00   Pack years: 15.00   Types: Cigarettes   Start date: 60   Quit date: 1980     Years since quitting: 41.1  Smokeless Tobacco Never Used   Counseling given: Yes   Continue to not smoke  Outpatient Encounter Medications as of 09/17/2019  Medication Sig   acetaminophen (TYLENOL) 500 MG tablet Take 1,000 mg by mouth daily as needed for moderate pain or headache.   alendronate (FOSAMAX) 70 MG tablet Take 70 mg by mouth every Saturday. Take with a full glass of water on an empty stomach.   amiodarone (PACERONE) 200 MG tablet Take 1 tablet (200 mg total) by mouth daily.   apixaban (ELIQUIS) 2.5 MG TABS tablet Take 1 tablet (2.5 mg total) by mouth 2 (two) times daily.   arformoterol (BROVANA) 15 MCG/2ML NEBU Take 2 mLs (15 mcg total) by nebulization 2 (two) times daily.   carvedilol (COREG) 12.5 MG tablet Take 12.5 mg by mouth 2 (two) times daily with a meal.   Cholecalciferol (VITAMIN D3) 2000 units TABS Take 2,000 Units by mouth daily.    clonazePAM (KLONOPIN) 0.5 MG tablet Take 0.5 mg by mouth 2 (two) times daily.   Ferrous Sulfate (IRON) 28 MG TABS Take 28 mg by mouth daily.    furosemide (LASIX) 20 MG tablet Take 20 mg by mouth daily as needed for edema.    hydrALAZINE (APRESOLINE) 25 MG tablet Take 25 mg by mouth 2 (two) times daily.    latanoprost (XALATAN) 0.005 % ophthalmic solution Place 1 drop into both eyes at bedtime.   loperamide (IMODIUM) 2 MG capsule Take 4 mg by mouth 4 (four) times daily as needed for diarrhea or loose stools.   mirtazapine (REMERON) 15 MG tablet Take 15 mg by mouth at bedtime.    potassium chloride (K-DUR) 10 MEQ tablet Take 10 mEq by mouth daily as needed (when taking lasix).    Revefenacin (YUPELRI) 175 MCG/3ML SOLN Inhale 175 mcg into the lungs daily.   venlafaxine XR (EFFEXOR-XR) 150 MG 24 hr capsule Take 150 mg by mouth 2 (two) times daily.   Respiratory Therapy Supplies (FLUTTER) DEVI 1 Device by Does not apply route as needed.   No facility-administered encounter medications on file as of 09/17/2019.      Review of Systems  Review of Systems  Constitutional: Positive for appetite change, fatigue and unexpected weight change. Negative for activity change and fever.  HENT: Negative for sinus pressure, sinus pain and sore throat.   Respiratory: Positive for shortness of breath. Negative for cough and wheezing.   Cardiovascular: Negative for chest pain and palpitations.  Gastrointestinal: Negative for diarrhea, nausea and vomiting.  Musculoskeletal: Negative for arthralgias.  Neurological: Negative for dizziness.  Psychiatric/Behavioral: Negative for sleep disturbance. The patient is not nervous/anxious.      Physical Exam  BP 118/80    Pulse 76    Temp 98.4 F (36.9 C) (Temporal)    Ht 4' 8.5" (1.435 m)    Wt 93 lb (  42.2 kg)    SpO2 98% Comment: on 3L   BMI 20.48 kg/m   Wt Readings from Last 5 Encounters:  09/17/19 93 lb (42.2 kg)  09/01/19 95 lb 6.4 oz (43.3 kg)  08/26/19 96 lb 3.2 oz (43.6 kg)  07/04/19 99 lb (44.9 kg)  06/26/19 101 lb (45.8 kg)    BMI Readings from Last 5 Encounters:  09/17/19 20.48 kg/m  09/01/19 21.01 kg/m  08/26/19 18.79 kg/m  07/04/19 19.33 kg/m  06/26/19 19.73 kg/m     Physical Exam Vitals and nursing note reviewed.  Constitutional:      General: She is not in acute distress.    Appearance: Normal appearance. She is normal weight.  HENT:     Head: Normocephalic and atraumatic.     Right Ear: External ear normal.     Left Ear: External ear normal.     Nose: Nose normal. No congestion.     Mouth/Throat:     Mouth: Mucous membranes are moist.     Pharynx: Oropharynx is clear.  Eyes:     Pupils: Pupils are equal, round, and reactive to light.  Cardiovascular:     Rate and Rhythm: Normal rate and regular rhythm.     Pulses: Normal pulses.     Heart sounds: Normal heart sounds. No murmur.  Pulmonary:     Effort: Pulmonary effort is normal. No respiratory distress.     Breath sounds: No decreased air movement. Rales (left basilar )  present. No decreased breath sounds or wheezing.  Musculoskeletal:     Cervical back: Normal range of motion.     Right lower leg: No edema.     Left lower leg: No edema.  Skin:    General: Skin is warm and dry.     Capillary Refill: Capillary refill takes less than 2 seconds.  Neurological:     General: No focal deficit present.     Mental Status: She is alert and oriented to person, place, and time. Mental status is at baseline.     Gait: Gait normal.  Psychiatric:        Mood and Affect: Mood is anxious.        Behavior: Behavior normal.        Thought Content: Thought content normal.        Judgment: Judgment normal.       Assessment & Plan:   Persistent atrial fibrillation (HCC) Plan: Continue Eliquis Continue follow-up with cardiology Complete echocardiogram is ordered  Bronchiectasis without complication (HCC) Confirmed interval progression of patient's mucous plugging on high-resolution CT chest this week Favoring atypical infection such as MAI Patient unable to produce sputum samples Patient afebrile  Discussion: Spoke with Dr. Loanne Drilling.  She agrees the patient needs to proceed forward with bronchoscopy.  We will work to try to get the patient scheduled next week with Dr. Loanne Drilling.   Plan: We will coordinate an outpatient bronchoscopy next week for the patient   Centrilobular emphysema ([place]) Felt to be unable to mobilize DPI inhalers Intolerant of Stiolto Respimat Barrel chest COPD Gold stage II on pulmonary function test in February/2021  Plan: Continue Brovana nebulized meds Continue Yupelri nebulized meds Increase physical activity   Chronic respiratory failure with hypoxia (HCC) Plan: Continue oxygen therapie Pain oxygen saturations between 88 to 92% Walk today in office Revealed you required 2 L of O2 with physical exertion   Malnutrition of moderate degree Plan: Made suggestions the patient should increase protein intake Suggested Ensure,  boost or Carnation breakfast, patient declines these today reporting that she has tried them in she cannot tolerate them  Patient following up with dentist to ensure she gets adequate dentures made to hopefully improve p.o. intake    Return in about 2 weeks (around 10/01/2019), or if symptoms worsen or fail to improve, for Follow up with Dr. Loanne Drilling.   Lauraine Rinne, NP 09/17/2019   This appointment required 42 minutes of patient care (this includes precharting, chart review, review of results, face-to-face care, etc.).

## 2019-09-17 NOTE — Assessment & Plan Note (Signed)
Plan: Continue oxygen therapie Pain oxygen saturations between 88 to 92% Walk today in office Revealed you required 2 L of O2 with physical exertion

## 2019-09-17 NOTE — Assessment & Plan Note (Signed)
Confirmed interval progression of patient's mucous plugging on high-resolution CT chest this week Favoring atypical infection such as MAI Patient unable to produce sputum samples Patient afebrile  Discussion: Spoke with Dr. Loanne Drilling.  She agrees the patient needs to proceed forward with bronchoscopy.  We will work to try to get the patient scheduled next week with Dr. Loanne Drilling.   Plan: We will coordinate an outpatient bronchoscopy next week for the patient

## 2019-09-17 NOTE — Patient Instructions (Addendum)
You were seen today by Lauraine Rinne, NP  for:   1. Bronchiectasis without complication ([place]) 2. Shortness of breath 3. Abnormal findings on diagnostic imaging of lung  - CT Chest Wo Contrast; Future  Bronchiectasis: This is the medical term which indicates that you have damage, dilated airways making you more susceptible to respiratory infection. Use a flutter valve 10 breaths twice a day or 4 to 5 breaths 4-5 times a day to help clear mucus out Let us know if you have cough with change in mucus color or fevers or chills.  At that point you would need an antibiotic. Maintain a healthy nutritious diet, eating whole foods Take your medications as prescribed   Complete echocardiogram as scheduled  I will discuss your high-resolution CT chest results with Dr. Loanne Drilling  Please try to supplement additional protein into your diet due to weight loss  We will plan on doing a repeat CT of your chest in 3 months in late May/2021  4. Chronic respiratory failure with hypoxia (HCC)  Walk today in office  Maintain oxygen saturations between 88 to 92%  Continue oxygen therapy as prescribed  >>>maintain oxygen saturations greater than 88 percent  >>>if unable to maintain oxygen saturations please contact the office  >>>do not smoke with oxygen  >>>can use nasal saline gel or nasal saline rinses to moisturize nose if oxygen causes dryness  5. Centrilobular emphysema ([place])  Continue Brovana nebulized meds Continue Yupelri nebulized meds  Note your daily symptoms > remember "red flags" for COPD:   >>>Increase in cough >>>increase in sputum production >>>increase in shortness of breath or activity  intolerance.   If you notice these symptoms, please call the office to be seen.   6. Persistent atrial fibrillation ([place])  Continue follow-up with cardiology as outlined  Complete echocardiogram as ordered   We recommend today:  Orders Placed This Encounter  Procedures  . CT Chest Wo  Contrast    Standing Status:   Future    Standing Expiration Date:   11/14/2020    Scheduling Instructions:     Schedule around 12/15/19    Order Specific Question:   Preferred imaging location?    Answer:   [place]    Order Specific Question:   Radiology Contrast Protocol - do NOT remove file path    Answer:   [email or web address]   Orders Placed This Encounter  Procedures  . CT Chest Wo Contrast   No orders of the defined types were placed in this encounter.   Follow Up:    Return in about 2 weeks (around 10/01/2019), or if symptoms worsen or fail to improve, for Follow up with Dr. Loanne Drilling.   Please do your part to reduce the spread of COVID-19:      Reduce your risk of any infection  and COVID19 by using the similar precautions used for avoiding the common cold or flu:  Marland Kitchen Wash your hands often with soap and warm water for at least 20 seconds.  If soap and water are not readily available, use an alcohol-based hand sanitizer with at least 60% alcohol.  . If coughing or sneezing, cover your mouth and nose by coughing or sneezing into the elbow areas of your shirt or coat, into a tissue or into your sleeve (not your hands). Langley Gauss A MASK when in public  . Avoid shaking hands with others and consider head nods or verbal greetings only. . Avoid touching your eyes, nose,  or mouth with unwashed hands.  . Avoid close contact with people who are sick. . Avoid places or events with large numbers of people in one location, like concerts or sporting events. . If you have some symptoms but not all symptoms, continue to monitor at home and seek medical attention if your symptoms worsen. . If you are having a medical emergency, call 911.   [place] / e-Visit: [email or web address]         [facility]: [facility]:  [phone]                   [facility]: [phone]     It is flu season:   >>> Best ways to protect herself from the flu: Receive the yearly flu vaccine, practice good hand hygiene washing with soap and also using hand sanitizer when available, eat a nutritious meals, get adequate rest, hydrate appropriately   Please contact the office if your symptoms worsen or you have concerns that you are not improving.   Thank you for choosing [facility] for your healthcare, and for allowing [place] to partner with you on your healthcare journey. I am thankful to be able to provide care to you today.   Wyn Quaker FNP-C

## 2019-09-17 NOTE — Assessment & Plan Note (Signed)
Plan: Continue Eliquis Continue follow-up with cardiology Complete echocardiogram is ordered

## 2019-09-18 ENCOUNTER — Telehealth: Payer: Self-pay | Admitting: *Deleted

## 2019-09-18 ENCOUNTER — Telehealth: Payer: Self-pay | Admitting: Pulmonary Disease

## 2019-09-18 DIAGNOSIS — R918 Other nonspecific abnormal finding of lung field: Secondary | ICD-10-CM

## 2019-09-18 NOTE — Telephone Encounter (Signed)
I see that Melissa Mcdaniel tried reaching the pt today so will forward to him per pt request  [name] has also spoken with the pt and let her know that the Bronch is scheduled for 09/24/19 at 10:30 am and also covid testing is scheduled   Melissa Mcdaniel,  pt can be reached at [phone]. Thanks!

## 2019-09-18 NOTE — Telephone Encounter (Signed)
Patient was seen yesterday by [name]. Will close this encounter.

## 2019-09-18 NOTE — Telephone Encounter (Signed)
I called [name] at [facility] echo and left her a vm to call pt to reschedule echo.  Called pt & spoke to her husband.  Made him aware [name] feels like echo can be rescheduled and someone from cardiology should be calling him to reschedule.  I gave him their phone #.  He asked me about other appts and I told him what [name] said in his note.  Pt is going to call endocrinology and reschedule that appt & will keep dental appt.

## 2019-09-18 NOTE — Telephone Encounter (Signed)
Pt's husband needs to resch covid test and procedure.  Please advise.  [phone]

## 2019-09-18 NOTE — Telephone Encounter (Signed)
----- Message from Princess Anne, MD sent at 09/17/2019  9:35 PM EST ----- Regarding: RE: Follow up - Recent O2 Start As discussed on the phone. Please arrange for flexible bronchoscopy (no fluoro) with moderate sedation at [facility]. Any day of the week is fine and any time is fine for me. Try to schedule on preferred dates per Dr. Juline Patch email. ----- Message ----- From: Lauraine Rinne, NP Sent: 09/17/2019   6:17 PM EST To: Chi Rodman Pickle, MD Subject: RE: Follow up - Recent O2 Start                Saw patient in clinic today.  Oxygen saturations were stable on 2 L.  This is an improvement from where she was previously requiring 3 L.Reviewed high-resolution CT chest with the patient.  Showing worsening bandlike consolidation favoring atypical infection such as MAI.  Patient is concerned regarding this.  Patient is not bringing up any mucus or sputum.  We will have patient start using flutter valve.  Highly doubt she will be able to bring up mucus as she is typically never coughed up mucus in the past.Patient may require bronchoscopy for further evaluation or work-up.  Patient would like to proceed forward with work-up if you are agreeable.Echocardiogram scheduled for completion the beginning of next month.  Still no aspect of fluid retention on exam today.Patient is willing to proceed forward with bronchoscopy sooner if you are agreeable.  She is not interested in palliative care at this time.[name]  ----- Message ----- From: Margaretha Seeds, MD Sent: 09/09/2019  12:59 PM EST To: Lauraine Rinne, NP, Valerie Salts, CMA Subject: RE: Follow up - Recent O2 Start                As discussed on the phone, plan for HRCT and echocardiogram. Will need to discuss GOC as well for endstage COPD.  Cherina, please arrange for televisit with [name] tomorrow.  JE ----- Message ----- From: Lauraine Rinne, NP Sent: 09/09/2019  12:12 PM EST To: Margaretha Seeds, MD Subject: Follow up - Recent O2 Start                     JE,Just wanted to update you regarding this patient.  She recently obtain pulmonary function testing which showed a reduced FEV1 less than 1 L as well as a close to severe diffusion defect.  Patient then presented back into our office for the following week and reported that she has been having drops in her oxygen level.  This was confirmed on a walk in our office yesterday.  She was found to require 3 L of O2 with physical activity.  This is a acute change over the last 2 weeks as we had recently walked her in office and she did not require oxygen at that point in time.Unfortunately due to her recurrent "reactions" to maintenance inhalers she is now maintained on maintenance nebulized meds of Perforomist and Yupelri.I redid a chest x-ray yesterday which shows increased interstitial opacities favoring more ILD.  Her last CT was in 2020 that showed some mucous plugging.Wanted to give you a heads up.  I have the patient planning on coming back into the office to see me next week for an in person physical exam as well as a repeat walk.  We will likely coordinate a closer follow-up with you after that visit.Do you want me to repeat CT imaging at this time?  You  have not seen the patient since July/2020.  At that time there were concerns the patient may require bronchoscopy.  She does not currently produce any sort of mucus.  She is unable to cough anything up.  She does have pretty significant barrel chest directly related to her emphysema.  If we do repeat a CT of her chest would you prefer for her to be a high-resolution CT chest given the potential work-up for bronchiectasis/bronchoscopy?[name]

## 2019-09-18 NOTE — Telephone Encounter (Signed)
Called and spoke with [name]. [name] stated that he might have to reschedule pt's bronch which is scheduled Friday 3/5. Pt has the covid test scheduled 3/2.  [name] states before [employer], pt has three other doctor appts prior to that which when pt goes to the appts, pt will not be quarantining.  He stated that he will try to see if he is able to get her appts if he needs to but Delfino Lovett wanted to know if it would be okay to keep the [place] Friday 3/5 after covid testing is done 3/2 if he is not able to reschedule pt's other appts.  Pt's other appts include a dental appt, echo, and an appt with endocrinology.  [name], please advise on this.

## 2019-09-18 NOTE — Telephone Encounter (Signed)
Patient is calling.  Would like to discuss appt with Wyn Quaker NP.  Patient phone number is [phone].

## 2019-09-18 NOTE — Telephone Encounter (Signed)
Based on the response received from Wyn Quaker, NP.  Please advise if the echocardiogram currently scheduled for 09/24/19 can be rescheduled. Thank you as always.

## 2019-09-18 NOTE — Telephone Encounter (Signed)
Patient scheduled for Bronch on Friday March 5 at 2pm at [facility]. Was told to arrive 1.5 hours early. Patient is going to hold their Eliquis 48 hours prior to procedure.   Patient scheduled for covid on Tuesday March 2 at 11am.   Nothing further needed at this time.

## 2019-09-18 NOTE — Telephone Encounter (Signed)
09/18/2019  I was able to reach the patient.  Answered all questions.  Reviewed schedule with patient next week.  They have no further questions.  I did explain that Dr. Tamala Julian is a partner of Dr. Loanne Drilling.  Patient knows to hold Eliquis 48 hours before the bronch.  Can resume Eliquis 24 hours post bronc.  We will route to Dr. Loanne Drilling, Dr. Tamala Julian, and Ander Purpura is FYI.  Wyn Quaker, FNP

## 2019-09-18 NOTE — Telephone Encounter (Signed)
My recommendations based on the patient's current symptoms would be to keep the bronc as scheduled.  We definitely recommend rescheduling the echocardiogram.  I know the patient is needing to complete the dental appointment.  Patient could also see if endocrinology can move that appointment out.An ideal situation patient would only have the dental appointment next week.Can the [facility]'s check to see if the echocardiogram can be rescheduled for the patient?Wyn Quaker, FNP

## 2019-09-21 ENCOUNTER — Ambulatory Visit: Payer: Medicare Other | Attending: Internal Medicine

## 2019-09-21 ENCOUNTER — Other Ambulatory Visit (HOSPITAL_COMMUNITY): Payer: Medicare Other

## 2019-09-21 DIAGNOSIS — Z23 Encounter for immunization: Secondary | ICD-10-CM

## 2019-09-21 NOTE — Progress Notes (Signed)
   Covid-19 Vaccination Clinic  Name:  Raushanah Osmundson    MRN: [id number] DOB: September 03, 1932  09/21/2019  Ms. Jury was observed post Covid-19 immunization for 15 minutes without incidence. She was provided with Vaccine Information Sheet and instruction to access the V-Safe system.   Ms. Gaona was instructed to call 911 with any severe reactions post vaccine: Marland Kitchen Difficulty breathing  . Swelling of your face and throat  . A fast heartbeat  . A bad rash all over your body  . Dizziness and weakness    Immunizations Administered    Name Date Dose VIS Date Route   Pfizer COVID-19 Vaccine 09/21/2019  5:15 PM 0.3 mL 07/03/2019 Intramuscular   Manufacturer: [place]   Lot: [id number]   [place]: [id number]

## 2019-09-22 ENCOUNTER — Other Ambulatory Visit (HOSPITAL_COMMUNITY)
Admission: RE | Admit: 2019-09-22 | Discharge: 2019-09-22 | Disposition: A | Discharge status: Home / Self Care | Payer: Medicare Other | Source: Ambulatory Visit | Attending: Internal Medicine | Admitting: Internal Medicine

## 2019-09-22 DIAGNOSIS — Z20822 Contact with and (suspected) exposure to covid-19: Secondary | ICD-10-CM | POA: Insufficient documentation

## 2019-09-22 DIAGNOSIS — Z01812 Encounter for preprocedural laboratory examination: Secondary | ICD-10-CM | POA: Insufficient documentation

## 2019-09-22 LAB — SARS CORONAVIRUS 2 (TAT 6-24 HRS): SARS Coronavirus 2: NEGATIVE

## 2019-09-23 ENCOUNTER — Ambulatory Visit: Payer: Medicare Other | Admitting: Internal Medicine

## 2019-09-24 ENCOUNTER — Other Ambulatory Visit (HOSPITAL_COMMUNITY): Payer: Medicare Other

## 2019-09-25 ENCOUNTER — Telehealth: Payer: Self-pay | Admitting: Internal Medicine

## 2019-09-25 ENCOUNTER — Encounter (HOSPITAL_COMMUNITY): Admission: RE | Payer: Self-pay | Source: Home / Self Care

## 2019-09-25 ENCOUNTER — Ambulatory Visit (HOSPITAL_COMMUNITY): Admission: RE | Admit: 2019-09-25 | Payer: Medicare Other | Source: Home / Self Care | Admitting: Internal Medicine

## 2019-09-25 SURGERY — VIDEO BRONCHOSCOPY WITHOUT FLUORO
Anesthesia: Moderate Sedation | Laterality: Bilateral

## 2019-09-25 NOTE — Telephone Encounter (Signed)
I did not cancel this procedure and am unclear where this information came from.

## 2019-09-25 NOTE — Telephone Encounter (Signed)
Spoke with patient's husband. He stated they arrived to [facility] today for the bronch but was told by the nursing staff that the procedure had been cancelled by the doctor. He was upset that no one had called them to let them know. I apologized to him since our office was not made aware of the cancellation as well. I assured him that I would find out happened and we will get this rescheduled. He verbalized understanding.    Spoke with [name]. He has requested that I route this message to him and the procedure pool.

## 2019-09-26 NOTE — Telephone Encounter (Signed)
Called husband, told to come to [place] Monday at 11:30AM.  NPO after midnight except essential meds.  Scheduled 1 PM bronch.

## 2019-09-28 ENCOUNTER — Other Ambulatory Visit: Payer: Self-pay

## 2019-09-28 ENCOUNTER — Ambulatory Visit (HOSPITAL_COMMUNITY): Payer: Medicare Other | Admitting: Certified Registered Nurse Anesthetist

## 2019-09-28 ENCOUNTER — Ambulatory Visit (HOSPITAL_COMMUNITY)
Admission: RE | Admit: 2019-09-28 | Discharge: 2019-09-28 | Disposition: A | Discharge status: Home / Self Care | Payer: Medicare Other | Source: Home / Self Care | Attending: Internal Medicine | Admitting: Internal Medicine

## 2019-09-28 ENCOUNTER — Encounter (HOSPITAL_COMMUNITY): Payer: Self-pay | Admitting: Internal Medicine

## 2019-09-28 ENCOUNTER — Encounter (HOSPITAL_COMMUNITY)
Admission: RE | Disposition: A | Discharge status: Home / Self Care | Payer: Self-pay | Source: Home / Self Care | Attending: Internal Medicine

## 2019-09-28 DIAGNOSIS — R918 Other nonspecific abnormal finding of lung field: Secondary | ICD-10-CM | POA: Diagnosis not present

## 2019-09-28 DIAGNOSIS — K5090 Crohn's disease, unspecified, without complications: Secondary | ICD-10-CM | POA: Insufficient documentation

## 2019-09-28 DIAGNOSIS — J9621 Acute and chronic respiratory failure with hypoxia: Secondary | ICD-10-CM | POA: Diagnosis not present

## 2019-09-28 DIAGNOSIS — G9341 Metabolic encephalopathy: Secondary | ICD-10-CM | POA: Diagnosis not present

## 2019-09-28 DIAGNOSIS — J151 Pneumonia due to Pseudomonas: Secondary | ICD-10-CM | POA: Diagnosis not present

## 2019-09-28 DIAGNOSIS — J984 Other disorders of lung: Secondary | ICD-10-CM | POA: Diagnosis not present

## 2019-09-28 DIAGNOSIS — J90 Pleural effusion, not elsewhere classified: Secondary | ICD-10-CM | POA: Diagnosis not present

## 2019-09-28 DIAGNOSIS — Z7983 Long term (current) use of bisphosphonates: Secondary | ICD-10-CM | POA: Insufficient documentation

## 2019-09-28 DIAGNOSIS — J439 Emphysema, unspecified: Secondary | ICD-10-CM | POA: Insufficient documentation

## 2019-09-28 DIAGNOSIS — Z880 Allergy status to penicillin: Secondary | ICD-10-CM | POA: Insufficient documentation

## 2019-09-28 DIAGNOSIS — J9600 Acute respiratory failure, unspecified whether with hypoxia or hypercapnia: Secondary | ICD-10-CM | POA: Diagnosis not present

## 2019-09-28 DIAGNOSIS — R0602 Shortness of breath: Secondary | ICD-10-CM | POA: Diagnosis not present

## 2019-09-28 DIAGNOSIS — Z66 Do not resuscitate: Secondary | ICD-10-CM | POA: Diagnosis not present

## 2019-09-28 DIAGNOSIS — N183 Chronic kidney disease, stage 3 unspecified: Secondary | ICD-10-CM | POA: Diagnosis not present

## 2019-09-28 DIAGNOSIS — R911 Solitary pulmonary nodule: Secondary | ICD-10-CM | POA: Diagnosis not present

## 2019-09-28 DIAGNOSIS — Z9981 Dependence on supplemental oxygen: Secondary | ICD-10-CM | POA: Insufficient documentation

## 2019-09-28 DIAGNOSIS — J189 Pneumonia, unspecified organism: Secondary | ICD-10-CM | POA: Insufficient documentation

## 2019-09-28 DIAGNOSIS — I5032 Chronic diastolic (congestive) heart failure: Secondary | ICD-10-CM | POA: Insufficient documentation

## 2019-09-28 DIAGNOSIS — J9622 Acute and chronic respiratory failure with hypercapnia: Secondary | ICD-10-CM | POA: Diagnosis not present

## 2019-09-28 DIAGNOSIS — E43 Unspecified severe protein-calorie malnutrition: Secondary | ICD-10-CM | POA: Diagnosis not present

## 2019-09-28 DIAGNOSIS — R4182 Altered mental status, unspecified: Secondary | ICD-10-CM | POA: Diagnosis not present

## 2019-09-28 DIAGNOSIS — J479 Bronchiectasis, uncomplicated: Secondary | ICD-10-CM | POA: Diagnosis not present

## 2019-09-28 DIAGNOSIS — Z87891 Personal history of nicotine dependence: Secondary | ICD-10-CM | POA: Insufficient documentation

## 2019-09-28 DIAGNOSIS — R6520 Severe sepsis without septic shock: Secondary | ICD-10-CM | POA: Diagnosis not present

## 2019-09-28 DIAGNOSIS — I5042 Chronic combined systolic (congestive) and diastolic (congestive) heart failure: Secondary | ICD-10-CM | POA: Diagnosis not present

## 2019-09-28 DIAGNOSIS — I110 Hypertensive heart disease with heart failure: Secondary | ICD-10-CM | POA: Insufficient documentation

## 2019-09-28 DIAGNOSIS — I130 Hypertensive heart and chronic kidney disease with heart failure and stage 1 through stage 4 chronic kidney disease, or unspecified chronic kidney disease: Secondary | ICD-10-CM | POA: Diagnosis not present

## 2019-09-28 DIAGNOSIS — Z20822 Contact with and (suspected) exposure to covid-19: Secondary | ICD-10-CM | POA: Diagnosis not present

## 2019-09-28 DIAGNOSIS — Z888 Allergy status to other drugs, medicaments and biological substances status: Secondary | ICD-10-CM | POA: Insufficient documentation

## 2019-09-28 DIAGNOSIS — I5033 Acute on chronic diastolic (congestive) heart failure: Secondary | ICD-10-CM | POA: Diagnosis not present

## 2019-09-28 DIAGNOSIS — I4891 Unspecified atrial fibrillation: Secondary | ICD-10-CM | POA: Insufficient documentation

## 2019-09-28 DIAGNOSIS — Z515 Encounter for palliative care: Secondary | ICD-10-CM | POA: Diagnosis not present

## 2019-09-28 DIAGNOSIS — A419 Sepsis, unspecified organism: Secondary | ICD-10-CM | POA: Diagnosis not present

## 2019-09-28 DIAGNOSIS — J470 Bronchiectasis with acute lower respiratory infection: Secondary | ICD-10-CM | POA: Diagnosis not present

## 2019-09-28 DIAGNOSIS — Z79899 Other long term (current) drug therapy: Secondary | ICD-10-CM | POA: Insufficient documentation

## 2019-09-28 HISTORY — PX: VIDEO BRONCHOSCOPY: SHX5072

## 2019-09-28 HISTORY — PX: BRONCHIAL WASHINGS: SHX5105

## 2019-09-28 LAB — BODY FLUID CELL COUNT WITH DIFFERENTIAL
Eos, Fluid: 0 %
Eos, Fluid: 1 %
Lymphs, Fluid: 0 %
Lymphs, Fluid: 1 %
Monocyte-Macrophage-Serous Fluid: 1 % — ABNORMAL LOW (ref 50–90)
Monocyte-Macrophage-Serous Fluid: 4 % — ABNORMAL LOW (ref 50–90)
Neutrophil Count, Fluid: 96 % — ABNORMAL HIGH (ref 0–25)
Neutrophil Count, Fluid: 97 % — ABNORMAL HIGH (ref 0–25)
Total Nucleated Cell Count, Fluid: 11550 cu mm — ABNORMAL HIGH (ref 0–1000)
Total Nucleated Cell Count, Fluid: 1680 cu mm — ABNORMAL HIGH (ref 0–1000)

## 2019-09-28 SURGERY — BRONCHOSCOPY, WITH FLUOROSCOPY
Anesthesia: Monitor Anesthesia Care | Laterality: Bilateral

## 2019-09-28 MED ORDER — PROPOFOL 500 MG/50ML IV EMUL
INTRAVENOUS | Status: DC | PRN
  Administered 2019-09-28: 150 ug/kg/min via INTRAVENOUS

## 2019-09-28 MED ORDER — ALBUTEROL SULFATE (2.5 MG/3ML) 0.083% IN NEBU
2.5000 mg | INHALATION_SOLUTION | Freq: Once | RESPIRATORY_TRACT | Status: AC
  Administered 2019-09-28: 2.5 mg via RESPIRATORY_TRACT

## 2019-09-28 MED ORDER — ALBUTEROL SULFATE (2.5 MG/3ML) 0.083% IN NEBU
INHALATION_SOLUTION | RESPIRATORY_TRACT | Status: AC
  Filled 2019-09-28: qty 3

## 2019-09-28 MED ORDER — LACTATED RINGERS IV SOLN: INTRAVENOUS | Status: DC | PRN

## 2019-09-28 MED ORDER — BUPIVACAINE-EPINEPHRINE (PF) 0.75% -1:200000 IJ SOLN: INTRAMUSCULAR | Status: DC | PRN

## 2019-09-28 MED ORDER — LIDOCAINE HCL (PF) 1 % IJ SOLN
INTRAMUSCULAR | Status: DC | PRN
  Administered 2019-09-28 (×2): 2 mL

## 2019-09-28 MED ORDER — LIDOCAINE HCL URETHRAL/MUCOSAL 2 % EX GEL
CUTANEOUS | Status: AC
  Filled 2019-09-28: qty 30

## 2019-09-28 MED ORDER — LIDOCAINE 2% (20 MG/ML) 5 ML SYRINGE
INTRAMUSCULAR | Status: DC | PRN
  Administered 2019-09-28: 20 mg via INTRAVENOUS

## 2019-09-28 MED ORDER — BUTAMBEN-TETRACAINE-BENZOCAINE 2-2-14 % EX AERO
INHALATION_SPRAY | CUTANEOUS | Status: DC | PRN
  Administered 2019-09-28: 2 via TOPICAL

## 2019-09-28 MED ORDER — PHENYLEPHRINE HCL 1 % NA SOLN
NASAL | Status: AC
  Filled 2019-09-28: qty 15

## 2019-09-28 NOTE — Telephone Encounter (Signed)
Thanks Linna Hoff.  [name]

## 2019-09-28 NOTE — Interval H&P Note (Signed)
History and Physical Interval Note:  09/28/2019 1:13 PM  Melissa Mcdaniel  has presented today for surgery, with the diagnosis of Pneumonia.  The various methods of treatment have been discussed with the patient and family. After consideration of risks, benefits and other options for treatment, the patient has consented to  Procedure(s): VIDEO BRONCHOSCOPY WITH FLUORO (Bilateral) as a surgical intervention.  The patient's history has been reviewed, patient examined, no change in status, stable for surgery.  I have reviewed the patient's chart and labs.  Questions were answered to the patient's satisfaction.     Candee Furbish

## 2019-09-28 NOTE — Anesthesia Postprocedure Evaluation (Signed)
Anesthesia Post Note  Patient: Melissa Mcdaniel  Procedure(s) Performed: VIDEO BRONCHOSCOPY WITH FLUORO (Bilateral ) BRONCHIAL WASHINGS     Patient location during evaluation: Endoscopy Anesthesia Type: MAC Level of consciousness: awake Pain management: pain level controlled Vital Signs Assessment: post-procedure vital signs reviewed and stable Respiratory status: spontaneous breathing, nonlabored ventilation, respiratory function stable and patient connected to nasal cannula oxygen Cardiovascular status: stable and blood pressure returned to baseline Postop Assessment: no apparent nausea or vomiting Anesthetic complications: no    Last Vitals:  Vitals:   09/28/19 1440 09/28/19 1445  BP: (!) 169/42   Pulse: 71 71  Resp: 18 (!) 22  Temp:    SpO2: 100% 100%    Last Pain:  Vitals:   09/28/19 1410  TempSrc: Temporal  PainSc: 0-No pain                 Melissa Mcdaniel P Haneefah Venturini

## 2019-09-28 NOTE — Transfer of Care (Signed)
Immediate Anesthesia Transfer of Care Note  Patient: Melissa Mcdaniel  Procedure(s) Performed: VIDEO BRONCHOSCOPY WITH FLUORO (Bilateral ) BRONCHIAL WASHINGS  Patient Location: Endoscopy Unit  Anesthesia Type:MAC  Level of Consciousness: awake, alert , oriented and patient cooperative  Airway & Oxygen Therapy: Patient Spontanous Breathing and Patient connected to face mask oxygen  Post-op Assessment: Report given to RN, Post -op Vital signs reviewed and stable and Patient moving all extremities  Post vital signs: Reviewed and stable  Last Vitals:  Vitals Value Taken Time  BP 135/37 09/28/19 1347  Temp    Pulse 69 09/28/19 1348  Resp 24 09/28/19 1348  SpO2 100 % 09/28/19 1348  Vitals shown include unvalidated device data.  Last Pain:  Vitals:   09/28/19 1224  TempSrc: Oral  PainSc: 0-No pain         Complications: No apparent anesthesia complications

## 2019-09-28 NOTE — Anesthesia Preprocedure Evaluation (Addendum)
Anesthesia Evaluation  Patient identified by MRN, date of birth, ID band Patient awake    Reviewed: Allergy & Precautions, NPO status , Patient's Chart, lab work & pertinent test results  Airway Mallampati: II  TM Distance: >3 FB Neck ROM: Full    Dental  (+) Partial Upper, Partial Lower   Pulmonary pneumonia, resolved, COPD,  COPD inhaler and oxygen dependent, former smoker,    Pulmonary exam normal breath sounds clear to auscultation       Cardiovascular hypertension, Pt. on home beta blockers +CHF  Normal cardiovascular exam+ dysrhythmias Atrial Fibrillation  Rhythm:Regular Rate:Normal  ECG: NSR   Neuro/Psych negative neurological ROS  negative psych ROS   GI/Hepatic negative GI ROS, Neg liver ROS,   Endo/Other  negative endocrine ROS  Renal/GU Renal disease     Musculoskeletal negative musculoskeletal ROS (+)   Abdominal   Peds  Hematology negative hematology ROS (+)   Anesthesia Other Findings Pneumonia  Reproductive/Obstetrics                            Anesthesia Physical Anesthesia Plan  ASA: IV  Anesthesia Plan: MAC   Post-op Pain Management:    Induction: Intravenous  PONV Risk Score and Plan: 2 and Propofol infusion and Treatment may vary due to age or medical condition  Airway Management Planned: Nasal Cannula  Additional Equipment:   Intra-op Plan:   Post-operative Plan:   Informed Consent: I have reviewed the patients History and Physical, chart, labs and discussed the procedure including the risks, benefits and alternatives for the proposed anesthesia with the patient or authorized representative who has indicated his/her understanding and acceptance.     Dental advisory given  Plan Discussed with: CRNA  Anesthesia Plan Comments:        Anesthesia Quick Evaluation

## 2019-09-28 NOTE — Discharge Instructions (Signed)
Flexible Bronchoscopy, Care After This sheet gives you information about how to care for yourself after your procedure. Your health care provider may also give you more specific instructions. If you have problems or questions, contact your health care provider. What can I expect after the procedure? After the procedure, it is common to have the following symptoms for 24-48 hours:  A cough that is worse than it was before the procedure.  A low-grade fever.  A sore throat or hoarse voice.  Small streaks of blood in the mucus from your lungs (sputum), if tissue samples were removed (biopsy). Follow these instructions at home: Eating and drinking  Do not eat or drink anything (including water) for 2 hours after your procedure, or until your numbing medicine (local anesthetic) has worn off. Having a numb throat increases your risk of burning yourself or choking.  After your numbness is gone and your cough and gag reflexes have returned, you may start eating only soft foods and slowly drinking liquids.  The day after the procedure, return to your normal diet. Driving  Do not drive for 24 hours if you were given a medicine to help you relax (sedative).  Do not drive or use heavy machinery while taking prescription pain medicine. General instructions   Take over-the-counter and prescription medicines only as told by your health care provider.  Return to your normal activities as told by your health care provider. Ask your health care provider what activities are safe for you.  Do not use any products that contain nicotine or tobacco, such as cigarettes and e-cigarettes. If you need help quitting, ask your health care provider.  Keep all follow-up visits as told by your health care provider. This is important, especially if you had a biopsy taken. Get help right away if:  You have shortness of breath that gets worse.  You become light-headed or feel like you might faint.  You have  chest pain.  You cough up more than a small amount of blood.  The amount of blood you cough up increases. Summary  Common symptoms in the 24-48 hours following a flexible bronchoscopy include cough, low-grade fever, sore throat or hoarse voice, and blood-streaked mucus from the lungs (if you had a biopsy).  Do not eat or drink anything (including water) for 2 hours after your procedure, or until your local anesthetic has worn off. You can return to your normal diet the day after the procedure.  Get help right away if you develop worsening shortness of breath, have chest pain, become light-headed, or cough up more than a small amount of blood. This information is not intended to replace advice given to you by your health care provider. Make sure you discuss any questions you have with your health care provider. Document Revised: 06/21/2017 Document Reviewed: 07/27/2016 Elsevier Patient Education  2020 [employer].

## 2019-09-28 NOTE — Progress Notes (Signed)
Pt has been resting comfortably since procedure. When waking up sounds a little wheezy. Dr. Roanna Banning called . Order for albuterol nebulizer  After nebulizer patient sounds less wheezy. Patient states that her breathing feels about the same as it usually does. Her O2 Sat is 100% on the 3 L that she wears at home.

## 2019-09-28 NOTE — Op Note (Signed)
Bronchoscopy  Indication: Abnormal CT chest  Consent: Signed in chart  Timeout performed  Anesthesia: See separate anesthesia records  Procedure - Timeout performed - Bronchoscope advanced through mouth and above trachea, 1% lidocaine x 11m injected to anesthesize then scope advanced above carina, 2 more mL 1% lidocaine administered to carina to reduce cough - Airways examined down to subsegmental level - Following airway examination, performed BAL in RLL and LLL  Findings - Copious mucopurulent secretions originating from lower lobes, worse on right, occluding bronchus intermedius, suctioned - Normal vocal cords - Slightly patulous trachea - BAL in both lower lobes was cloudy and c/w infection  Specimen(s): BAL RLL, BAL LLL  Complications: None immeidate

## 2019-09-29 ENCOUNTER — Encounter: Payer: Self-pay | Admitting: *Deleted

## 2019-09-29 LAB — ACID FAST SMEAR (AFB, MYCOBACTERIA)
Acid Fast Smear: NEGATIVE
Acid Fast Smear: NEGATIVE

## 2019-09-29 LAB — CYTOLOGY - NON PAP

## 2019-09-29 NOTE — Progress Notes (Signed)
Patient sleeping ,report received from husband.

## 2019-09-30 ENCOUNTER — Telehealth: Payer: Self-pay | Admitting: Pulmonary Disease

## 2019-09-30 ENCOUNTER — Emergency Department (HOSPITAL_COMMUNITY): Payer: Medicare Other

## 2019-09-30 ENCOUNTER — Inpatient Hospital Stay (HOSPITAL_COMMUNITY)
Admission: EM | Admit: 2019-09-30 | Discharge: 2019-10-22 | DRG: 870 | Disposition: E | Payer: Medicare Other | Attending: Critical Care Medicine | Admitting: Critical Care Medicine

## 2019-09-30 DIAGNOSIS — J9690 Respiratory failure, unspecified, unspecified whether with hypoxia or hypercapnia: Secondary | ICD-10-CM | POA: Diagnosis not present

## 2019-09-30 DIAGNOSIS — J470 Bronchiectasis with acute lower respiratory infection: Secondary | ICD-10-CM | POA: Diagnosis present

## 2019-09-30 DIAGNOSIS — Z515 Encounter for palliative care: Secondary | ICD-10-CM | POA: Diagnosis not present

## 2019-09-30 DIAGNOSIS — F329 Major depressive disorder, single episode, unspecified: Secondary | ICD-10-CM | POA: Diagnosis present

## 2019-09-30 DIAGNOSIS — Z833 Family history of diabetes mellitus: Secondary | ICD-10-CM

## 2019-09-30 DIAGNOSIS — Z880 Allergy status to penicillin: Secondary | ICD-10-CM

## 2019-09-30 DIAGNOSIS — I5033 Acute on chronic diastolic (congestive) heart failure: Secondary | ICD-10-CM | POA: Diagnosis present

## 2019-09-30 DIAGNOSIS — R64 Cachexia: Secondary | ICD-10-CM | POA: Diagnosis present

## 2019-09-30 DIAGNOSIS — Z4682 Encounter for fitting and adjustment of non-vascular catheter: Secondary | ICD-10-CM | POA: Diagnosis not present

## 2019-09-30 DIAGNOSIS — Z20822 Contact with and (suspected) exposure to covid-19: Secondary | ICD-10-CM | POA: Diagnosis present

## 2019-09-30 DIAGNOSIS — I10 Essential (primary) hypertension: Secondary | ICD-10-CM | POA: Diagnosis not present

## 2019-09-30 DIAGNOSIS — J9622 Acute and chronic respiratory failure with hypercapnia: Secondary | ICD-10-CM | POA: Diagnosis present

## 2019-09-30 DIAGNOSIS — D509 Iron deficiency anemia, unspecified: Secondary | ICD-10-CM | POA: Diagnosis present

## 2019-09-30 DIAGNOSIS — K5090 Crohn's disease, unspecified, without complications: Secondary | ICD-10-CM | POA: Diagnosis present

## 2019-09-30 DIAGNOSIS — R0689 Other abnormalities of breathing: Secondary | ICD-10-CM | POA: Diagnosis not present

## 2019-09-30 DIAGNOSIS — R0902 Hypoxemia: Secondary | ICD-10-CM | POA: Diagnosis not present

## 2019-09-30 DIAGNOSIS — J90 Pleural effusion, not elsewhere classified: Secondary | ICD-10-CM | POA: Diagnosis not present

## 2019-09-30 DIAGNOSIS — J9621 Acute and chronic respiratory failure with hypoxia: Secondary | ICD-10-CM | POA: Diagnosis present

## 2019-09-30 DIAGNOSIS — Z7189 Other specified counseling: Secondary | ICD-10-CM

## 2019-09-30 DIAGNOSIS — Z801 Family history of malignant neoplasm of trachea, bronchus and lung: Secondary | ICD-10-CM

## 2019-09-30 DIAGNOSIS — I130 Hypertensive heart and chronic kidney disease with heart failure and stage 1 through stage 4 chronic kidney disease, or unspecified chronic kidney disease: Secondary | ICD-10-CM | POA: Diagnosis present

## 2019-09-30 DIAGNOSIS — R4182 Altered mental status, unspecified: Secondary | ICD-10-CM | POA: Diagnosis not present

## 2019-09-30 DIAGNOSIS — J151 Pneumonia due to Pseudomonas: Secondary | ICD-10-CM | POA: Diagnosis present

## 2019-09-30 DIAGNOSIS — I4821 Permanent atrial fibrillation: Secondary | ICD-10-CM | POA: Diagnosis present

## 2019-09-30 DIAGNOSIS — J432 Centrilobular emphysema: Secondary | ICD-10-CM | POA: Diagnosis present

## 2019-09-30 DIAGNOSIS — J9602 Acute respiratory failure with hypercapnia: Secondary | ICD-10-CM | POA: Diagnosis not present

## 2019-09-30 DIAGNOSIS — Z6821 Body mass index (BMI) 21.0-21.9, adult: Secondary | ICD-10-CM

## 2019-09-30 DIAGNOSIS — N179 Acute kidney failure, unspecified: Secondary | ICD-10-CM | POA: Diagnosis present

## 2019-09-30 DIAGNOSIS — Z66 Do not resuscitate: Secondary | ICD-10-CM

## 2019-09-30 DIAGNOSIS — G9341 Metabolic encephalopathy: Secondary | ICD-10-CM | POA: Diagnosis present

## 2019-09-30 DIAGNOSIS — J9600 Acute respiratory failure, unspecified whether with hypoxia or hypercapnia: Secondary | ICD-10-CM

## 2019-09-30 DIAGNOSIS — E43 Unspecified severe protein-calorie malnutrition: Secondary | ICD-10-CM | POA: Insufficient documentation

## 2019-09-30 DIAGNOSIS — R339 Retention of urine, unspecified: Secondary | ICD-10-CM | POA: Diagnosis not present

## 2019-09-30 DIAGNOSIS — N1831 Chronic kidney disease, stage 3a: Secondary | ICD-10-CM | POA: Diagnosis present

## 2019-09-30 DIAGNOSIS — Z7901 Long term (current) use of anticoagulants: Secondary | ICD-10-CM

## 2019-09-30 DIAGNOSIS — Z01818 Encounter for other preprocedural examination: Secondary | ICD-10-CM

## 2019-09-30 DIAGNOSIS — Z79899 Other long term (current) drug therapy: Secondary | ICD-10-CM

## 2019-09-30 DIAGNOSIS — I4811 Longstanding persistent atrial fibrillation: Secondary | ICD-10-CM

## 2019-09-30 DIAGNOSIS — Z9911 Dependence on respirator [ventilator] status: Secondary | ICD-10-CM | POA: Diagnosis not present

## 2019-09-30 DIAGNOSIS — D631 Anemia in chronic kidney disease: Secondary | ICD-10-CM | POA: Diagnosis present

## 2019-09-30 DIAGNOSIS — U071 COVID-19: Secondary | ICD-10-CM | POA: Diagnosis not present

## 2019-09-30 DIAGNOSIS — Z4659 Encounter for fitting and adjustment of other gastrointestinal appliance and device: Secondary | ICD-10-CM

## 2019-09-30 DIAGNOSIS — J9601 Acute respiratory failure with hypoxia: Secondary | ICD-10-CM | POA: Diagnosis present

## 2019-09-30 DIAGNOSIS — R6520 Severe sepsis without septic shock: Secondary | ICD-10-CM | POA: Diagnosis not present

## 2019-09-30 DIAGNOSIS — T380X5A Adverse effect of glucocorticoids and synthetic analogues, initial encounter: Secondary | ICD-10-CM | POA: Diagnosis not present

## 2019-09-30 DIAGNOSIS — R918 Other nonspecific abnormal finding of lung field: Secondary | ICD-10-CM | POA: Diagnosis not present

## 2019-09-30 DIAGNOSIS — J471 Bronchiectasis with (acute) exacerbation: Secondary | ICD-10-CM | POA: Diagnosis present

## 2019-09-30 DIAGNOSIS — R001 Bradycardia, unspecified: Secondary | ICD-10-CM | POA: Diagnosis not present

## 2019-09-30 DIAGNOSIS — R0602 Shortness of breath: Secondary | ICD-10-CM

## 2019-09-30 DIAGNOSIS — A419 Sepsis, unspecified organism: Secondary | ICD-10-CM | POA: Diagnosis present

## 2019-09-30 DIAGNOSIS — E875 Hyperkalemia: Secondary | ICD-10-CM | POA: Diagnosis not present

## 2019-09-30 DIAGNOSIS — R739 Hyperglycemia, unspecified: Secondary | ICD-10-CM | POA: Diagnosis not present

## 2019-09-30 DIAGNOSIS — Z87891 Personal history of nicotine dependence: Secondary | ICD-10-CM

## 2019-09-30 DIAGNOSIS — Z9981 Dependence on supplemental oxygen: Secondary | ICD-10-CM

## 2019-09-30 LAB — CULTURE, RESPIRATORY W GRAM STAIN

## 2019-09-30 LAB — POCT I-STAT 7, (LYTES, BLD GAS, ICA,H+H)
Acid-Base Excess: 1 mmol/L (ref 0.0–2.0)
Bicarbonate: 29.3 mmol/L — ABNORMAL HIGH (ref 20.0–28.0)
Calcium, Ion: 1.16 mmol/L (ref 1.15–1.40)
HCT: 29 % — ABNORMAL LOW (ref 36.0–46.0)
Hemoglobin: 9.9 g/dL — ABNORMAL LOW (ref 12.0–15.0)
O2 Saturation: 100 %
Patient temperature: 97
Potassium: 4.5 mmol/L (ref 3.5–5.1)
Sodium: 130 mmol/L — ABNORMAL LOW (ref 135–145)
TCO2: 31 mmol/L (ref 22–32)
pCO2 arterial: 65.6 mmHg (ref 32.0–48.0)
pH, Arterial: 7.254 — ABNORMAL LOW (ref 7.350–7.450)
pO2, Arterial: 422 mmHg — ABNORMAL HIGH (ref 83.0–108.0)

## 2019-09-30 LAB — CBC WITH DIFFERENTIAL/PLATELET
Abs Immature Granulocytes: 0.16 10*3/uL — ABNORMAL HIGH (ref 0.00–0.07)
Basophils Absolute: 0.1 10*3/uL (ref 0.0–0.1)
Basophils Relative: 1 %
Eosinophils Absolute: 0.1 10*3/uL (ref 0.0–0.5)
Eosinophils Relative: 0 %
HCT: 35.6 % — ABNORMAL LOW (ref 36.0–46.0)
Hemoglobin: 10.1 g/dL — ABNORMAL LOW (ref 12.0–15.0)
Immature Granulocytes: 1 %
Lymphocytes Relative: 9 %
Lymphs Abs: 1.3 10*3/uL (ref 0.7–4.0)
MCH: 26.2 pg (ref 26.0–34.0)
MCHC: 28.4 g/dL — ABNORMAL LOW (ref 30.0–36.0)
MCV: 92.5 fL (ref 80.0–100.0)
Monocytes Absolute: 1.1 10*3/uL — ABNORMAL HIGH (ref 0.1–1.0)
Monocytes Relative: 8 %
Neutro Abs: 12.3 10*3/uL — ABNORMAL HIGH (ref 1.7–7.7)
Neutrophils Relative %: 81 %
Platelets: 362 10*3/uL (ref 150–400)
RBC: 3.85 MIL/uL — ABNORMAL LOW (ref 3.87–5.11)
RDW: 13.5 % (ref 11.5–15.5)
WBC: 15 10*3/uL — ABNORMAL HIGH (ref 4.0–10.5)
nRBC: 0 % (ref 0.0–0.2)

## 2019-09-30 LAB — TROPONIN I (HIGH SENSITIVITY)
Troponin I (High Sensitivity): 12 ng/L (ref <18)
Troponin I (High Sensitivity): 13 ng/L (ref <18)

## 2019-09-30 LAB — COMPREHENSIVE METABOLIC PANEL
ALT: 34 U/L (ref 0–44)
ALT: 43 U/L (ref 0–44)
AST: 36 U/L (ref 15–41)
AST: 61 U/L — ABNORMAL HIGH (ref 15–41)
Albumin: 3 g/dL — ABNORMAL LOW (ref 3.5–5.0)
Albumin: 2.2 g/dL — ABNORMAL LOW (ref 3.5–5.0)
Alkaline Phosphatase: 81 U/L (ref 38–126)
Alkaline Phosphatase: 59 U/L (ref 38–126)
Anion gap: 9 (ref 5–15)
Anion gap: 8 (ref 5–15)
BUN: 26 mg/dL — ABNORMAL HIGH (ref 8–23)
BUN: 27 mg/dL — ABNORMAL HIGH (ref 8–23)
CO2: 26 mmol/L (ref 22–32)
CO2: 25 mmol/L (ref 22–32)
Calcium: 8.2 mg/dL — ABNORMAL LOW (ref 8.9–10.3)
Calcium: 7.8 mg/dL — ABNORMAL LOW (ref 8.9–10.3)
Chloride: 93 mmol/L — ABNORMAL LOW (ref 98–111)
Chloride: 96 mmol/L — ABNORMAL LOW (ref 98–111)
Creatinine, Ser: 1.52 mg/dL — ABNORMAL HIGH (ref 0.44–1.00)
Creatinine, Ser: 1.5 mg/dL — ABNORMAL HIGH (ref 0.44–1.00)
GFR calc Af Amer: 36 mL/min — ABNORMAL LOW (ref >60)
GFR calc Af Amer: 37 mL/min — ABNORMAL LOW (ref >60)
GFR calc non Af Amer: 31 mL/min — ABNORMAL LOW (ref >60)
GFR calc non Af Amer: 32 mL/min — ABNORMAL LOW (ref >60)
Glucose, Bld: 153 mg/dL — ABNORMAL HIGH (ref 70–99)
Glucose, Bld: 107 mg/dL — ABNORMAL HIGH (ref 70–99)
Potassium: 5 mmol/L (ref 3.5–5.1)
Potassium: 4.3 mmol/L (ref 3.5–5.1)
Sodium: 128 mmol/L — ABNORMAL LOW (ref 135–145)
Sodium: 129 mmol/L — ABNORMAL LOW (ref 135–145)
Total Bilirubin: 0.6 mg/dL (ref 0.3–1.2)
Total Bilirubin: 0.4 mg/dL (ref 0.3–1.2)
Total Protein: 7 g/dL (ref 6.5–8.1)
Total Protein: 5.3 g/dL — ABNORMAL LOW (ref 6.5–8.1)

## 2019-09-30 LAB — CBC
HCT: 27.3 % — ABNORMAL LOW (ref 36.0–46.0)
Hemoglobin: 8 g/dL — ABNORMAL LOW (ref 12.0–15.0)
MCH: 26.3 pg (ref 26.0–34.0)
MCHC: 29.3 g/dL — ABNORMAL LOW (ref 30.0–36.0)
MCV: 89.8 fL (ref 80.0–100.0)
Platelets: 197 10*3/uL (ref 150–400)
RBC: 3.04 MIL/uL — ABNORMAL LOW (ref 3.87–5.11)
RDW: 13.4 % (ref 11.5–15.5)
WBC: 8.6 10*3/uL (ref 4.0–10.5)
nRBC: 0 % (ref 0.0–0.2)

## 2019-09-30 LAB — PHOSPHORUS
Phosphorus: 2.8 mg/dL (ref 2.5–4.6)
Phosphorus: 2.8 mg/dL (ref 2.5–4.6)

## 2019-09-30 LAB — CBG MONITORING, ED: Glucose-Capillary: 93 mg/dL (ref 70–99)

## 2019-09-30 LAB — LACTIC ACID, PLASMA
Lactic Acid, Venous: 1 mmol/L (ref 0.5–1.9)
Lactic Acid, Venous: 1.2 mmol/L (ref 0.5–1.9)

## 2019-09-30 LAB — MAGNESIUM
Magnesium: 1.5 mg/dL — ABNORMAL LOW (ref 1.7–2.4)
Magnesium: 1.4 mg/dL — ABNORMAL LOW (ref 1.7–2.4)

## 2019-09-30 LAB — SARS CORONAVIRUS 2 (TAT 6-24 HRS): SARS Coronavirus 2: NEGATIVE

## 2019-09-30 LAB — PROTIME-INR
INR: 1.3 — ABNORMAL HIGH (ref 0.8–1.2)
Prothrombin Time: 16.1 seconds — ABNORMAL HIGH (ref 11.4–15.2)

## 2019-09-30 LAB — BRAIN NATRIURETIC PEPTIDE: B Natriuretic Peptide: 545.1 pg/mL — ABNORMAL HIGH (ref 0.0–100.0)

## 2019-09-30 LAB — GLUCOSE, CAPILLARY: Glucose-Capillary: 94 mg/dL (ref 70–99)

## 2019-09-30 LAB — MRSA PCR SCREENING: MRSA by PCR: NEGATIVE

## 2019-09-30 LAB — APTT: aPTT: 37 seconds — ABNORMAL HIGH (ref 24–36)

## 2019-09-30 MED ORDER — PROPOFOL 1000 MG/100ML IV EMUL
5.0000 ug/kg/min | INTRAVENOUS | Status: DC
  Administered 2019-09-30: 5 ug/kg/min via INTRAVENOUS
  Administered 2019-10-01: 25 ug/kg/min via INTRAVENOUS
  Administered 2019-10-01: 15 ug/kg/min via INTRAVENOUS
  Administered 2019-10-02: 10 ug/kg/min via INTRAVENOUS
  Administered 2019-10-03: 20 ug/kg/min via INTRAVENOUS
  Filled 2019-09-30 (×4): qty 100

## 2019-09-30 MED ORDER — ORAL CARE MOUTH RINSE
15.0000 mL | OROMUCOSAL | Status: DC
  Administered 2019-09-30 – 2019-10-10 (×85): 15 mL via OROMUCOSAL

## 2019-09-30 MED ORDER — PANTOPRAZOLE SODIUM 40 MG PO PACK
40.0000 mg | PACK | Freq: Every day | ORAL | Status: DC
  Administered 2019-09-30 – 2019-10-03 (×4): 40 mg
  Filled 2019-09-30 (×4): qty 20

## 2019-09-30 MED ORDER — ARFORMOTEROL TARTRATE 15 MCG/2ML IN NEBU
15.0000 ug | INHALATION_SOLUTION | Freq: Two times a day (BID) | RESPIRATORY_TRACT | Status: DC
  Administered 2019-09-30 – 2019-10-10 (×20): 15 ug via RESPIRATORY_TRACT
  Filled 2019-09-30 (×21): qty 2

## 2019-09-30 MED ORDER — CHLORHEXIDINE GLUCONATE CLOTH 2 % EX PADS
6.0000 | MEDICATED_PAD | Freq: Every day | CUTANEOUS | Status: DC
  Administered 2019-10-01 – 2019-10-10 (×10): 6 via TOPICAL

## 2019-09-30 MED ORDER — VANCOMYCIN HCL IN DEXTROSE 1-5 GM/200ML-% IV SOLN: 1000.0000 mg | Freq: Once | INTRAVENOUS | Status: DC

## 2019-09-30 MED ORDER — VITAL HIGH PROTEIN PO LIQD
1000.0000 mL | ORAL | Status: AC
  Administered 2019-09-30 – 2019-10-05 (×5): 1000 mL
  Filled 2019-09-30: qty 1000

## 2019-09-30 MED ORDER — HYDRALAZINE HCL 25 MG PO TABS
25.0000 mg | ORAL_TABLET | Freq: Two times a day (BID) | ORAL | Status: DC
  Administered 2019-10-01 – 2019-10-03 (×6): 25 mg
  Filled 2019-09-30 (×7): qty 1

## 2019-09-30 MED ORDER — CARVEDILOL 12.5 MG PO TABS
12.5000 mg | ORAL_TABLET | Freq: Two times a day (BID) | ORAL | Status: DC
  Administered 2019-09-30 – 2019-10-03 (×7): 12.5 mg
  Filled 2019-09-30 (×7): qty 1

## 2019-09-30 MED ORDER — VANCOMYCIN HCL 500 MG/100ML IV SOLN: 500.0000 mg | INTRAVENOUS | Status: DC

## 2019-09-30 MED ORDER — PRO-STAT SUGAR FREE PO LIQD
30.0000 mL | Freq: Two times a day (BID) | ORAL | Status: DC
  Administered 2019-09-30 – 2019-10-01 (×3): 30 mL
  Filled 2019-09-30 (×4): qty 30

## 2019-09-30 MED ORDER — PROPOFOL 1000 MG/100ML IV EMUL
INTRAVENOUS | Status: AC
  Filled 2019-09-30: qty 100

## 2019-09-30 MED ORDER — SODIUM CHLORIDE 0.9 % IV SOLN: 2.0000 g | Freq: Once | INTRAVENOUS | Status: DC

## 2019-09-30 MED ORDER — HEPARIN SODIUM (PORCINE) 5000 UNIT/ML IJ SOLN: 5000.0000 [IU] | Freq: Three times a day (TID) | INTRAMUSCULAR | Status: DC

## 2019-09-30 MED ORDER — CLONAZEPAM 0.5 MG PO TABS: 0.2500 mg | ORAL_TABLET | Freq: Two times a day (BID) | ORAL | Status: DC

## 2019-09-30 MED ORDER — ROCURONIUM BROMIDE 50 MG/5ML IV SOLN
INTRAVENOUS | Status: AC | PRN
  Administered 2019-09-30: 100 mg via INTRAVENOUS

## 2019-09-30 MED ORDER — APIXABAN 2.5 MG PO TABS
2.5000 mg | ORAL_TABLET | Freq: Two times a day (BID) | ORAL | Status: DC
  Administered 2019-09-30 – 2019-10-10 (×20): 2.5 mg
  Filled 2019-09-30 (×22): qty 1

## 2019-09-30 MED ORDER — SODIUM CHLORIDE 0.9 % IV SOLN
2.0000 g | Freq: Once | INTRAVENOUS | Status: AC
  Administered 2019-09-30: 2 g via INTRAVENOUS
  Filled 2019-09-30: qty 2

## 2019-09-30 MED ORDER — CLONAZEPAM 0.25 MG PO TBDP
0.2500 mg | ORAL_TABLET | Freq: Two times a day (BID) | ORAL | Status: DC
  Administered 2019-09-30 – 2019-10-04 (×8): 0.25 mg via ORAL
  Filled 2019-09-30 (×8): qty 1

## 2019-09-30 MED ORDER — VANCOMYCIN HCL 750 MG/150ML IV SOLN
750.0000 mg | Freq: Once | INTRAVENOUS | Status: AC
  Administered 2019-09-30: 750 mg via INTRAVENOUS
  Filled 2019-09-30: qty 150

## 2019-09-30 MED ORDER — CHLORHEXIDINE GLUCONATE 0.12% ORAL RINSE (MEDLINE KIT)
15.0000 mL | Freq: Two times a day (BID) | OROMUCOSAL | Status: DC
  Administered 2019-09-30 – 2019-10-10 (×20): 15 mL via OROMUCOSAL

## 2019-09-30 MED ORDER — BISACODYL 10 MG RE SUPP: 10.0000 mg | Freq: Every day | RECTAL | Status: DC | PRN

## 2019-09-30 MED ORDER — ETOMIDATE 2 MG/ML IV SOLN
INTRAVENOUS | Status: AC | PRN
  Administered 2019-09-30: 10 mg via INTRAVENOUS

## 2019-09-30 MED ORDER — DOCUSATE SODIUM 50 MG/5ML PO LIQD
100.0000 mg | Freq: Two times a day (BID) | ORAL | Status: DC | PRN
  Filled 2019-09-30: qty 10

## 2019-09-30 MED ORDER — SODIUM CHLORIDE 0.9 % IV SOLN: 2.0000 g | INTRAVENOUS | Status: DC

## 2019-09-30 MED ORDER — FENTANYL CITRATE (PF) 100 MCG/2ML IJ SOLN
25.0000 ug | INTRAMUSCULAR | Status: DC | PRN
  Administered 2019-09-30 – 2019-10-01 (×4): 50 ug via INTRAVENOUS
  Administered 2019-10-02: 25 ug via INTRAVENOUS
  Administered 2019-10-02 (×2): 50 ug via INTRAVENOUS
  Administered 2019-10-03 (×2): 25 ug via INTRAVENOUS
  Filled 2019-09-30 (×9): qty 2

## 2019-09-30 MED ORDER — BUDESONIDE 0.5 MG/2ML IN SUSP
0.5000 mg | Freq: Two times a day (BID) | RESPIRATORY_TRACT | Status: DC
  Administered 2019-09-30 – 2019-10-10 (×20): 0.5 mg via RESPIRATORY_TRACT
  Filled 2019-09-30 (×22): qty 2

## 2019-09-30 MED ORDER — ONDANSETRON HCL 4 MG/2ML IJ SOLN: 4.0000 mg | Freq: Four times a day (QID) | INTRAMUSCULAR | Status: DC | PRN

## 2019-09-30 MED ORDER — ACETAMINOPHEN 325 MG PO TABS: 650.0000 mg | ORAL_TABLET | ORAL | Status: DC | PRN

## 2019-09-30 MED ORDER — FENTANYL CITRATE (PF) 100 MCG/2ML IJ SOLN
INTRAMUSCULAR | Status: AC
  Administered 2019-09-30: 50 ug
  Filled 2019-09-30: qty 2

## 2019-09-30 MED ORDER — AMIODARONE HCL 200 MG PO TABS
200.0000 mg | ORAL_TABLET | Freq: Every day | ORAL | Status: DC
  Administered 2019-09-30 – 2019-10-10 (×11): 200 mg
  Filled 2019-09-30 (×11): qty 1

## 2019-09-30 MED ORDER — LACTATED RINGERS IV BOLUS
1000.0000 mL | Freq: Once | INTRAVENOUS | Status: AC
  Administered 2019-09-30: 1000 mL via INTRAVENOUS

## 2019-09-30 MED ORDER — SODIUM CHLORIDE 0.9 % IV SOLN
1.0000 g | INTRAVENOUS | Status: DC
  Administered 2019-10-01 – 2019-10-03 (×3): 1 g via INTRAVENOUS
  Filled 2019-09-30 (×3): qty 1

## 2019-09-30 MED ORDER — VENLAFAXINE HCL 75 MG PO TABS
75.0000 mg | ORAL_TABLET | Freq: Two times a day (BID) | ORAL | Status: DC
  Administered 2019-09-30 – 2019-10-10 (×19): 75 mg
  Filled 2019-09-30 (×23): qty 1

## 2019-09-30 MED ORDER — IRON 28 MG PO TABS: 28.0000 mg | ORAL_TABLET | Freq: Every day | ORAL | Status: DC

## 2019-09-30 NOTE — Progress Notes (Signed)
Patient transported from ED to [facility] without issues. RT called 2H RT with report.

## 2019-09-30 NOTE — ED Notes (Signed)
Critical Care NP at bedside notified of difficulty obtaining urine sample, no new orders received.

## 2019-09-30 NOTE — Telephone Encounter (Signed)
Cultures just came back this AM Looks like ER intubated her Will let ICU team know she is growing pseudomonas in lungs

## 2019-09-30 NOTE — ED Provider Notes (Addendum)
[facility] EMERGENCY DEPARTMENT Provider Note   CSN: [id number] Arrival date & time: 10/08/2019  9407     History Chief Complaint  Patient presents with  . Shortness of Breath   LEVEL FIVE CAVEAT DUE TO SEVERITY OF CONDITION   Melissa Mcdaniel is a 86 y.o. female.  HPI HPI Comments: Melissa Mcdaniel is a 86 y.o. female who presents to the Emergency Department with a history of COPD complaining of SOB onset this morning. EMS states that patient was initially saturating in the 80s on 3 L nasal cannula.  Upon arrival she is placed on a nonrebreather and is saturating at 100.  She is currently unresponsive.  Records indicate that she had a bronchoscopy performed 2 days ago with copious mucopurulent material noted in the bilateral lungs right greater than left.  Her most recent negative Covid test was on March 2.  Husband now at bedside.  He states that she has intermittently been on oxygen therapy for the past 2 weeks.  He confirms her history of COPD.  He states her symptoms began to worsen at about midnight last night.     Past Medical History:  Diagnosis Date  . A-fib ([place])   . Chronic kidney disease   . Congestive heart failure (CHF) ([place]) 05/2016  . Crohn's disease ([place])   . Dysrhythmia    afib dx approx. 2016 per pt  . Hip fracture ([place])   . Hypertension     Patient Active Problem List   Diagnosis Date Noted  . Abnormal findings on diagnostic imaging of lung 09/17/2019  . Chronic respiratory failure with hypoxia ([place]) 09/10/2019  . Shortness of breath 09/10/2019  . Secondary hypercoagulable state ([place]) 08/26/2019  . Advice given about COVID-19 virus infection 07/07/2019  . Medication management 03/06/2019  . Centrilobular emphysema ([place]) 02/16/2019  . Lung nodule, multiple 02/16/2019  . Bronchiectasis without complication ([place]) 68/02/8109  . Chronic combined systolic and diastolic heart failure ([place]) 03/29/2018  . Elevated troponin 03/29/2018    . CKD (chronic kidney disease), stage III 03/29/2018  . Anemia 03/29/2018  . Malnutrition of moderate degree 07/26/2016  . Closed right hip fracture, initial encounter ([place]) 07/24/2016  . Hypertension 07/24/2016  . Persistent atrial fibrillation ([place]) 07/24/2016    Past Surgical History:  Procedure Laterality Date  . BRONCHIAL WASHINGS  09/28/2019   Procedure: BRONCHIAL WASHINGS;  Surgeon: Candee Furbish, MD;  Location: [facility];  Service: Endoscopy;;  . CARDIOVERSION N/A 02/04/2019   Procedure: CARDIOVERSION;  Surgeon: Buford Dresser, MD;  Location: [facility] ENDOSCOPY;  Service: Cardiovascular;  Laterality: N/A;  . FEMUR IM NAIL Right 07/26/2016   Procedure: INTRAMEDULLARY (IM) NAIL FEMORAL;  Surgeon: Renette Butters, MD;  Location: [facility];  Service: Orthopedics;  Laterality: Right;  . FRACTURE SURGERY    . large intestine - partial removal  approx 1993   due to crohns  . VIDEO BRONCHOSCOPY Bilateral 09/28/2019   Procedure: VIDEO BRONCHOSCOPY WITH FLUORO;  Surgeon: Candee Furbish, MD;  Location: [facility];  Service: Endoscopy;  Laterality: Bilateral;     OB History   No obstetric history on file.     Family History  Problem Relation Age of Onset  . Lung cancer Mother   . Alzheimer's disease Father   . Diabetes Maternal Grandmother     Social History   Tobacco Use  . Smoking status: Former Smoker    Packs/day: 0.50    Years: 30.00    Pack years: 15.00  Types: Cigarettes    Start date: 41    Quit date: 1980    Years since quitting: 41.2  . Smokeless tobacco: Never Used  Substance Use Topics  . Alcohol use: No  . Drug use: No    Home Medications Prior to Admission medications   Medication Sig Start Date End Date Taking? Authorizing Provider  acetaminophen (TYLENOL) 500 MG tablet Take 1,000 mg by mouth every 6 (six) hours as needed for moderate pain or headache.     [provider]  alendronate (FOSAMAX) 70 MG tablet Take 70 mg by mouth every  Saturday. Take with a full glass of water on an empty stomach.    [provider]  amiodarone (PACERONE) 200 MG tablet Take 1 tablet (200 mg total) by mouth daily. 02/10/19   Fenton, Clint R, PA  apixaban (ELIQUIS) 2.5 MG TABS tablet Take 1 tablet (2.5 mg total) by mouth 2 (two) times daily. 08/03/19   Burnell Blanks, MD  arformoterol (BROVANA) 15 MCG/2ML NEBU Take 2 mLs (15 mcg total) by nebulization 2 (two) times daily. 08/14/19   Lauraine Rinne, NP  carvedilol (COREG) 12.5 MG tablet Take 12.5 mg by mouth 2 (two) times daily with a meal.    [provider]  Cholecalciferol (VITAMIN D3) 2000 units TABS Take 2,000 Units by mouth daily.     [provider]  clonazePAM (KLONOPIN) 0.5 MG tablet Take 0.5 mg by mouth 2 (two) times daily.    [provider]  Ferrous Sulfate (IRON) 28 MG TABS Take 28 mg by mouth daily.     [provider]  furosemide (LASIX) 20 MG tablet Take 20 mg by mouth daily as needed for edema (fluid retention.).     [provider]  hydrALAZINE (APRESOLINE) 25 MG tablet Take 25 mg by mouth 2 (two) times daily.     [provider]  latanoprost (XALATAN) 0.005 % ophthalmic solution Place 1 drop into both eyes at bedtime.    [provider]  loperamide (IMODIUM) 2 MG capsule Take 4 mg by mouth 4 (four) times daily as needed for diarrhea or loose stools.    [provider]  mirtazapine (REMERON) 15 MG tablet Take 15 mg by mouth at bedtime.     [provider]  potassium chloride (K-DUR) 10 MEQ tablet Take 10 mEq by mouth daily as needed (when taking lasix (fluid retention)).     [provider]  Respiratory Therapy Supplies (FLUTTER) DEVI 1 Device by Does not apply route as needed. 09/17/19   Lauraine Rinne, NP  Revefenacin (YUPELRI) 175 MCG/3ML SOLN Inhale 175 mcg into the lungs daily. Patient taking differently: Inhale 175 mcg into the lungs daily in the afternoon.  08/14/19   Lauraine Rinne, NP  venlafaxine XR (EFFEXOR-XR) 150 MG 24 hr capsule Take 150 mg by mouth 2 (two) times daily.    [provider]    Allergies    Penicillins and Levothyroxine  Review of Systems   Review of Systems  Unable to perform ROS: Acuity of condition   Physical Exam Updated Vital Signs BP (!) 196/76 (BP Location: Right Arm)   Pulse 80   SpO2 100%   Physical Exam Vitals and nursing note reviewed.  Constitutional:      General: She is in acute distress.     Appearance: She is normal weight. She is ill-appearing and diaphoretic.     Comments: Cachectic elderly Caucasian female.  Patient unresponsive  at this time.  HENT:     Head: Normocephalic and atraumatic.  Eyes:     Pupils: Pupils are equal, round, and reactive to light.     Comments: Unable to assess EOMs due to patient state  Neck:     Vascular: JVD present.  Cardiovascular:     Rate and Rhythm: Normal rate and regular rhythm.     Heart sounds: No murmur. No friction rub. No gallop.   Pulmonary:     Effort: Tachypnea and respiratory distress present.     Breath sounds: Examination of the right-upper field reveals rales. Examination of the left-upper field reveals rales. Examination of the right-middle field reveals rales. Examination of the left-middle field reveals rales. Examination of the right-lower field reveals rales. Examination of the left-lower field reveals rales. Rales present.  Skin:    Coloration: Skin is pale.  Neurological:     Comments: Negative gag reflex.  Patient is unresponsive to questioning.    ED Results / Procedures / Treatments   Labs (all labs ordered are listed, but only abnormal results are displayed) Labs Reviewed  COMPREHENSIVE METABOLIC PANEL - Abnormal; Notable for the following components:      Result Value   Sodium 128 (*)    Chloride 93 (*)    Glucose, Bld 153 (*)    BUN 26 (*)    Creatinine, Ser 1.52 (*)    Calcium 8.2 (*)    Albumin 3.0 (*)    GFR calc non Af  Amer 31 (*)    GFR calc Af Amer 36 (*)    All other components within normal limits  CBC WITH DIFFERENTIAL/PLATELET - Abnormal; Notable for the following components:   WBC 15.0 (*)    RBC 3.85 (*)    Hemoglobin 10.1 (*)    HCT 35.6 (*)    MCHC 28.4 (*)    Neutro Abs 12.3 (*)    Monocytes Absolute 1.1 (*)    Abs Immature Granulocytes 0.16 (*)    All other components within normal limits  APTT - Abnormal; Notable for the following components:   aPTT 37 (*)    All other components within normal limits  PROTIME-INR - Abnormal; Notable for the following components:   Prothrombin Time 16.1 (*)    INR 1.3 (*)    All other components within normal limits  SARS CORONAVIRUS 2 (TAT 6-24 HRS)  CULTURE, BLOOD (ROUTINE X 2)  CULTURE, BLOOD (ROUTINE X 2)  URINE CULTURE  MRSA PCR SCREENING  LACTIC ACID, PLASMA  BRAIN NATRIURETIC PEPTIDE  LACTIC ACID, PLASMA  URINALYSIS, ROUTINE W REFLEX MICROSCOPIC  TROPONIN I (HIGH SENSITIVITY)    EKG EKG Interpretation  Date/Time:  Wednesday September 30 2019 07:38:21 EST Ventricular Rate:  80 PR Interval:    QRS Duration: 104 QT Interval:  399 QTC Calculation: 461 R Axis:   79 Text Interpretation: Sinus rhythm Prolonged PR interval Left ventricular hypertrophy Anterior Q waves, possibly due to LVH since last tracing no significant change Confirmed by Noemi Chapel [phone]) on 09/21/2019 8:40:08 AM   Radiology DG Chest Portable 1 View  Result Date: 09/23/2019 CLINICAL DATA:  Hypoxia EXAM: PORTABLE CHEST 1 VIEW COMPARISON:  September 08, 2019 FINDINGS: Endotracheal tube tip is 3.4 cm above the carina. Nasogastric tube tip and side port are in the stomach. No pneumothorax. There is a small right pleural effusion with ill-defined airspace opacity in the right lower lung region. Elsewhere there is diffuse interstitial thickening, similar to prior study. Heart  is upper normal in size with pulmonary vascularity normal. There is aortic atherosclerosis. No  adenopathy. No bone lesions. IMPRESSION: Tube positions as described without pneumothorax. Airspace opacity, likely pneumonia, in the right lower lung region with small right pleural effusion. Underlying apparent interstitial fibrosis throughout the lungs bilaterally. Stable cardiac silhouette. Aortic Atherosclerosis (ICD10-I70.0). Electronically Signed   By: Lowella Grip III M.D.   On: 09/28/2019 08:18    Procedures Procedure Name: Intubation Date/Time: 09/26/2019 8:00 AM Performed by: Rayna Sexton, PA-C Pre-anesthesia Checklist: Patient identified, Emergency Drugs available, Suction available, Patient being monitored and Timeout performed Oxygen Delivery Method: Ambu bag Preoxygenation: Pre-oxygenation with 100% oxygen Induction Type: Rapid sequence Ventilation: Oral airway inserted - appropriate to patient size Laryngoscope Size: Mac and 4 Grade View: Grade II Tube type: Subglottic suction tube Tube size: 7.0 mm Number of attempts: 1 Intubation method: direct laryngoscopy. Placement Confirmation: ETT inserted through vocal cords under direct vision,  Positive ETCO2,  Breath sounds checked- equal and bilateral and CO2 detector Secured at: 20 cm Tube secured with: ETT holder Dental Injury: Teeth and Oropharynx as per pre-operative assessment  Comments:        Medications Ordered in ED Medications  ceFEPIme (MAXIPIME) 2 g in sodium chloride 0.9 % 100 mL IVPB (has no administration in time range)  vancomycin (VANCOREADY) IVPB 750 mg/150 mL (750 mg Intravenous New Bag/Given 09/29/2019 0819)  propofol (DIPRIVAN) 1000 MG/100ML infusion (5 mcg/kg/min  42.2 kg Intravenous New Bag/Given 10/17/2019 0800)  etomidate (AMIDATE) injection (10 mg Intravenous Given 10/04/2019 0746)  rocuronium (ZEMURON) injection (100 mg Intravenous Given 10/06/2019 0748)    ED Course  I have reviewed the triage vital signs and the nursing notes.  Pertinent labs & imaging results that were available during my  care of the patient were reviewed by me and considered in my medical decision making (see chart for details).    MDM Rules/Calculators/A&P                      7:40 AM patient is a 86 year old caucasian female that presents with SOB. Pt had bronchoscopy two days ago showing significant mucopurulent discharge. Her sx began to worsen last night and she presented tachypneic in significant respiratory distress. Pt was discussed and assessed with Dr. Sabra Heck. We discussed with the husband and notified him that she will need to be intubated. Ordered labs, CXR, ecg, and abx. Will closely monitor.   7:57 AM patient was intubated. Will work with Dr. Sabra Heck on having patient admitted to critical care team for what appears to be severe sepsis.   Final Clinical Impression(s) / ED Diagnoses Final diagnoses:  Shortness of breath  Altered mental status, unspecified altered mental status type    Rx / DC Orders ED Discharge Orders    None       Rayna Sexton, PA-C 10/16/2019 0805    Rayna Sexton, PA-C 10/02/2019 1245    Noemi Chapel, MD 10/01/19 2249

## 2019-09-30 NOTE — Progress Notes (Addendum)
Pharmacy Antibiotic Note  Melissa Mcdaniel is a 86 y.o. female admitted on 10/18/2019 with sepsis, concern for pna.  Pharmacy has been consulted for vancomycin and aztreonam dosing.  Pt has tolerated cephalosporins in past, pcn allergy low risk childhood allergy, will change to cefepime.  CKD, about at baseline.    Plan: Vancomycin 750 mg IV x 1, then 500 mg IV every 48 hours Goal AUC 400-550. Expected AUC: 419 SCr used: 1.52 Cefepime 2g IV every 24 hours Add MRSA PCR Monitor renal function, Cx/PCR and clinical progression to narrow Vancomycin levels at steady state     No data recorded.  No results for input(s): WBC, CREATININE, LATICACIDVEN, VANCOTROUGH, VANCOPEAK, VANCORANDOM, GENTTROUGH, GENTPEAK, GENTRANDOM, TOBRATROUGH, TOBRAPEAK, TOBRARND, AMIKACINPEAK, AMIKACINTROU, AMIKACIN in the last 168 hours.  CrCl cannot be calculated (Patient's most recent lab result is older than the maximum 21 days allowed.).    Allergies  Allergen Reactions  . Penicillins Swelling    Did it involve swelling of the face/tongue/throat, SOB, or low BP? Yes Did it involve sudden or severe rash/hives, skin peeling, or any reaction on the inside of your mouth or nose? No Did you need to seek medical attention at a hospital or doctor's office? No When did it last happen?childhood allergy If all above answers are "NO", may proceed with cephalosporin use.   . Levothyroxine Nausea And Vomiting    Antimicrobials this admission: Vanc 3/10>> Cefepime 3/10>>  Dose adjustments this admission: n/a  Microbiology results: 3/10 BCx: sent 3/10 UCx: sent  Addendum: Will change to ceftazidime 1g IV every 24 hours per CCM  Bertis Ruddy, PharmD Clinical Pharmacist ED Pharmacist Phone # [phone] 09/21/2019 7:44 AM

## 2019-09-30 NOTE — H&P (Addendum)
NAME:  Melissa Mcdaniel, MRN:  [id number], DOB:  1933-06-16, LOS: 0 ADMISSION DATE:  09/28/2019, CONSULTATION DATE:  10/14/2019 REFERRING MD: Dr. Sabra Heck, CHIEF COMPLAINT: SOB  Brief History   86 y/o F who presented to [facility] on 3/10 with reports of increasing SOB, AMS.  Found to have hypercarbic respiratory failure in the setting of suspected RLL PNA as seen on CXR. Required intubation for respiratory distress.  Pt underwent FOB on 3/8 with cultures growing pan-sensitive pseudomonas.    History of present illness   86 y/o F who presented to [facility] on 3/10 with reports of increasing SOB, AMS.    Most recently, the patient was seen on 2/16 and identified to need O2 with exertion.  On 2/18 she was seen for evaluation and felt at that time to have an increase in possible mucus plugging, noted prior concern of MAI appearance on CT's in 2020.  HRCT was ordered to evaluate new O2 needs and assessed on 2/23 with findings of slight progression in the mid and lower lung zone predominant peribronchovascular nodularity & consolidation with associated bronchiectasis and peribronchial thickening, findings concerning for MAI, separate area of new nodularity in the RUL, no evidence of fibrotic interstitial lung disease, mild air trapping indicative of small airways disease, emphysema,.  On 2/18 visit, there was discussion of referral to Palliative Care for symptom management. The patient was seen again in the office 2/25 for follow up of HRCT and FOB was arranged at that time.  She was COVID negative on 2/26 and 3/2.  The patient underwent FOB on 3/8 with cultures growing pan-sensitive pseudomonas.    She presented to [facility] on 3/10 with reports of increased SOB, and was found to have hypercarbic respiratory failure in the setting of suspected RLL PNA.  Required intubation for respiratory distress.    PCCM called for ICU admit.   Past Medical History  HTN AF Chronic Diastolic HF  HTN Chronic Hypoxic Respiratory Failure  - new 2L O2 dependent with exertion  CKD  Chron's Disease  Depression   Significant Hospital Events   3/10 Admit   Consults:  PCCM   Procedures:  ETT 3/10 >>   Significant Diagnostic Tests:   HRCT 09/15/19 >> slight progression in the mid and lower lung zone predominant peribronchovascular nodularity & consolidation with associated bronchiectasis and peribronchial thickening, findings concerning for MAI, separate area of new nodularity in the RUL, no evidence of fibrotic interstitial lung disease, mild air trapping indicative of small airways disease, emphysema, aortic atherosclerosis  CXR 3/10 >> concern for new RLL airspace opacity with small right pleural effusion  Micro Data:  COVID 3/2 >> negative BAL 3/8 >> pseudomonas aeruginosa >> pansensitive  AFB 3/8 >> smear negative >>   COVID 3/10 >>  BCx2 3/10 >>   Antimicrobials:  Vanco 3/10  Cefepime 3/10 >>   Interim history/subjective:  RN reports she was unable to complete UA.    Objective   Blood pressure (!) 144/58, pulse 80, temperature (!) 97 F (36.1 C), temperature source Rectal, resp. rate (!) 8, weight 42.2 kg, SpO2 100 %.    Vent Mode: PRVC FiO2 (%):  [60 %-100 %] 60 % Set Rate:  [14 bmp-20 bmp] 20 bmp Vt Set:  [450 mL] 450 mL PEEP:  [5 cmH20] 5 cmH20 Plateau Pressure:  [15 cmH20] 15 cmH20  No intake or output data in the 24 hours ending 10/14/2019 0958 Filed Weights   10/06/2019 0817  Weight: 42.2 kg  Examination: General: frail elderly female lying in bed in NAD on vent   HEENT: MM pink/moist, edentulous, ETT, anicteric, pupils =/reactive Neuro: sedate, will nod intermittently to questions CV: s1s2 RRR, no m/r/g PULM:  Non-labored on vent, crackles on right, coarse on left  GI: soft, bsx4 active  Extremities: warm/dry, no edema  Skin: no rashes or lesions  Resolved Hospital Problem list      Assessment & Plan:   Pseudomonas Pneumonia  BAL from 3/8 with pansensitive pseudomonas.  Note RLL,  LLL sites of BAL and concern for infection at time of FOB given appearance of secretions. AFB smear negative, await final cultures.   -admit to ICU  -narrow abx to ceftazidime  -follow AFB from BAL  -pulmonary hygiene as able - early mobilization, PT efforts  Acute Hypercarbic Respiratory Failure secondary to Pseudomonas PNA in setting of Bronchiectasis  -PRVC 8cc/kg  -wean PEEP / FiO2 for sats 88-95% -follow intermittent CXR  -VAP prevention measures  -continue brovana + pulmicort  -minimize sedating medications as able -needs flutter valve post extubation, and for home   Chronic Diastolic CHF  HTN  AF  -tele monitoring  -continue home amiodarone, eliquis, coreg, hydralazine   Anemia  -trend CBC  -continue home iron   Depression  -continue effexor -continue klonopin at reduced dose, may need to continue this reduced dose at discharge with recent weight loss, poor PO intake   Weight Loss  Moderate Protein Calorie Malnutrition  Recent dental extractions for implants, possible MAI / infection  -begin TF for nutrition    Best practice:  Diet: NPO Pain/Anxiety/Delirium protocol (if indicated): in place  VAP protocol (if indicated): in place  DVT prophylaxis: Eliquis  GI prophylaxis: PPI  Glucose control: n/a  Mobility: BR  Code Status: Full Code. Discussed with husband at bedside.  Family Communication: Husband updated on plan of care 3/10.  Disposition: ICU  Labs   CBC: Recent Labs  Lab 09/22/2019 0729 09/24/2019 0851  WBC 15.0*  --   NEUTROABS 12.3*  --   HGB 10.1* 9.9*  HCT 35.6* 29.0*  MCV 92.5  --   PLT 362  --     Basic Metabolic Panel: Recent Labs  Lab 09/24/2019 0729 10/01/2019 0851  NA 128* 130*  K 5.0 4.5  CL 93*  --   CO2 26  --   GLUCOSE 153*  --   BUN 26*  --   CREATININE 1.52*  --   CALCIUM 8.2*  --    GFR: Estimated Creatinine Clearance: 15.8 mL/min (A) (by C-G formula based on SCr of 1.52 mg/dL (H)). Recent Labs  Lab 09/27/2019 0729  10/02/2019 0740  WBC 15.0*  --   LATICACIDVEN  --  1.0    Liver Function Tests: Recent Labs  Lab 10/01/2019 0729  AST 36  ALT 34  ALKPHOS 81  BILITOT 0.6  PROT 7.0  ALBUMIN 3.0*   No results for input(s): LIPASE, AMYLASE in the last 168 hours. No results for input(s): AMMONIA in the last 168 hours.  ABG    Component Value Date/Time   PHART 7.254 (L) 09/23/2019 0851   PCO2ART 65.6 (HH) 09/24/2019 0851   PO2ART 422.0 (H) 10/17/2019 0851   HCO3 29.3 (H) 10/09/2019 0851   TCO2 31 10/03/2019 0851   O2SAT 100.0 09/28/2019 0851     Coagulation Profile: Recent Labs  Lab 10/19/2019 0729  INR 1.3*    Cardiac Enzymes: No results for input(s): CKTOTAL, CKMB, CKMBINDEX, TROPONINI in the last  168 hours.  HbA1C: No results found for: HGBA1C  CBG: No results for input(s): GLUCAP in the last 168 hours.  Review of Systems:   Unable to complete due to mechanical ventilation.   Past Medical History  She,  has a past medical history of A-fib ([place]), Chronic kidney disease, Congestive heart failure (CHF) ([place]) (05/2016), Crohn's disease ([place]), Dysrhythmia, Hip fracture ([place]), and Hypertension.   Surgical History    Past Surgical History:  Procedure Laterality Date  . BRONCHIAL WASHINGS  09/28/2019   Procedure: BRONCHIAL WASHINGS;  Surgeon: Candee Furbish, MD;  Location: [facility];  Service: Endoscopy;;  . CARDIOVERSION N/A 02/04/2019   Procedure: CARDIOVERSION;  Surgeon: Buford [facility]er, MD;  Location: [facility] ENDOSCOPY;  Service: Cardiovascular;  Laterality: N/A;  . FEMUR IM NAIL Right 07/26/2016   Procedure: INTRAMEDULLARY (IM) NAIL FEMORAL;  Surgeon: Renette Butters, MD;  Location: [place];  Service: Orthopedics;  Laterality: Right;  . FRACTURE SURGERY    . large intestine - partial removal  approx 1993   due to crohns  . VIDEO BRONCHOSCOPY Bilateral 09/28/2019   Procedure: VIDEO BRONCHOSCOPY WITH FLUORO;  Surgeon: Candee Furbish, MD;  Location: [facility];  Service:  Endoscopy;  Laterality: Bilateral;     Social History   reports that she quit smoking about 41 years ago. Her smoking use included cigarettes. She started smoking about 71 years ago. She has a 15.00 pack-year smoking history. She has never used smokeless tobacco. She reports that she does not drink alcohol or use drugs.   Family History   Her family history includes Alzheimer's disease in her father; Diabetes in her maternal grandmother; Lung cancer in her mother.   Allergies Allergies  Allergen Reactions  . Penicillins Swelling    Did it involve swelling of the face/tongue/throat, SOB, or low BP? Yes Did it involve sudden or severe rash/hives, skin peeling, or any reaction on the inside of your mouth or nose? No Did you need to seek medical attention at a hospital or doctor's office? No When did it last happen?childhood allergy If all above answers are "NO", may proceed with cephalosporin use.   . Levothyroxine Nausea And Vomiting     Home Medications  Prior to Admission medications   Medication Sig Start Date End Date Taking? Authorizing Provider  acetaminophen (TYLENOL) 500 MG tablet Take 1,000 mg by mouth every 6 (six) hours as needed for moderate pain or headache.    Yes [provider]  alendronate (FOSAMAX) 70 MG tablet Take 70 mg by mouth every Saturday. Take with a full glass of water on an empty stomach.   Yes [provider]  amiodarone (PACERONE) 200 MG tablet Take 1 tablet (200 mg total) by mouth daily. 02/10/19  Yes Fenton, Clint R, PA  apixaban (ELIQUIS) 2.5 MG TABS tablet Take 1 tablet (2.5 mg total) by mouth 2 (two) times daily. 08/03/19  Yes Burnell Blanks, MD  arformoterol (BROVANA) 15 MCG/2ML NEBU Take 2 mLs (15 mcg total) by nebulization 2 (two) times daily. 08/14/19  Yes Lauraine Rinne, NP  carvedilol (COREG) 12.5 MG tablet Take 12.5 mg by mouth 2 (two) times daily with a meal.   Yes [provider]  Cholecalciferol (VITAMIN  D3) 2000 units TABS Take 2,000 Units by mouth daily.    Yes [provider]  clonazePAM (KLONOPIN) 0.5 MG tablet Take 0.5 mg by mouth 2 (two) times daily.   Yes [provider]  Ferrous Sulfate (IRON) 28 MG  TABS Take 28 mg by mouth daily.    Yes [provider]  furosemide (LASIX) 20 MG tablet Take 20 mg by mouth daily as needed for edema (fluid retention.).    Yes [provider]  hydrALAZINE (APRESOLINE) 25 MG tablet Take 25 mg by mouth 2 (two) times daily.    Yes [provider]  latanoprost (XALATAN) 0.005 % ophthalmic solution Place 1 drop into both eyes at bedtime.   Yes [provider]  loperamide (IMODIUM) 2 MG capsule Take 4 mg by mouth 4 (four) times daily as needed for diarrhea or loose stools.   Yes [provider]  mirtazapine (REMERON) 15 MG tablet Take 15 mg by mouth at bedtime.    Yes [provider]  Respiratory Therapy Supplies (FLUTTER) DEVI 1 Device by Does not apply route as needed. 09/17/19  Yes Lauraine Rinne, NP  Revefenacin (YUPELRI) 175 MCG/3ML SOLN Inhale 175 mcg into the lungs daily. Patient taking differently: Inhale 175 mcg into the lungs daily in the afternoon.  08/14/19  Yes Lauraine Rinne, NP  venlafaxine XR (EFFEXOR-XR) 150 MG 24 hr capsule Take 150 mg by mouth 2 (two) times daily.   Yes [provider]     Critical care time: 59 minutes     Noe Gens, MSN, NP-C [facility] 10/14/2019, 9:58 AM   Please see [email or web address] for pager details.

## 2019-09-30 NOTE — ED Notes (Signed)
Dr. Sabra Heck aware of difficulty placing indwelling catheter. Stated bladder scan patient and utilized female in and out catheter. No urine output noted upon insertion.

## 2019-09-30 NOTE — ED Provider Notes (Signed)
This patient is an ill-appearing 86 year old female, she has multiple medical problems, had a bronchoscopy on 8 March at which time they found copious mucopurulent secretions from the lower lobes worse on the right, she was suctioned, vocal cords appeared normal, bronchoalveolar lavage was performed in the bilateral lower lobes, considered consistent with infection.  The husband is at the bedside giving additional information as the patient is unable to speak thus a level 5 caveat applies  She is obtunded, has JVD, is tachypneic with shallow breathing and rales in the bilateral lungs.  No obvious edema, no gag reflex, she will need to be intubated.  This patient is critically ill.  .Critical Care Performed by: Noemi Chapel, MD Authorized by: Noemi Chapel, MD   Critical care provider statement:    Critical care time (minutes):  35   Critical care time was exclusive of:  Separately billable procedures and treating other patients and teaching time   Critical care was necessary to treat or prevent imminent or life-threatening deterioration of the following conditions:  Respiratory failure   Critical care was time spent personally by me on the following activities:  Blood draw for specimens, development of treatment plan with patient or surrogate, discussions with consultants, evaluation of patient's response to treatment, examination of patient, obtaining history from patient or surrogate, ordering and performing treatments and interventions, ordering and review of laboratory studies, ordering and review of radiographic studies, pulse oximetry, re-evaluation of patient's condition and review of old charts OG placement  Date/Time: 10/01/2019 8:00 AM Performed by: Noemi Chapel, MD Authorized by: Noemi Chapel, MD  Consent: The procedure was performed in an emergent situation. Required items: required blood products, implants, devices, and special equipment available Time out: Immediately prior to  procedure a "time out" was called to verify the correct patient, procedure, equipment, support staff and site/side marked as required. Preparation: Patient was prepped and draped in the usual sterile fashion. Patient tolerance: patient tolerated the procedure well with no immediate complications Comments: Was done post intubation   I have personally viewed the patient's x-ray of the chest, it appears that the endotracheal tube is in appropriate position, the OG tube courses below the diaphragm into the stomach, there is bilateral infiltrates in the patient's lungs.  I have compared this to the patient's prior x-ray from February 16, there is an acute and different change, bilateral infiltrates right greater than left.  This patient is been placed on multiple broad-spectrum antibiotics, she will need to be placed in the intensive care unit for what appears to be severe sepsis.  The patient is critically ill.  I discussed the care with the patient's husband at the bedside, he agreed that he wanted her to be given every opportunity, full code at this time  D/w Laurey Arrow with CC - they will admit  Final diagnoses:  Shortness of breath  Altered mental status, unspecified altered mental status type  Sepsis, due to unspecified organism, unspecified whether acute organ dysfunction present [facility])  Acute respiratory failure, unspecified whether with hypoxia or hypercapnia ([place])        Noemi Chapel, MD 10/01/19 2248

## 2019-09-30 NOTE — ED Triage Notes (Signed)
Came in via EMS; reported from home; daughter called EMS for shortness of breathe and stated + covid 1 week ago. Hx of dementia and COPD endorsed as well.

## 2019-09-30 NOTE — Telephone Encounter (Signed)
Called and spoke with patient's daughter. She mentioned patient had a Bronch done on Monday 09/28/19 and was told there was lots of infection that was scene but were sending samples off to be cultured. Patient is not in emergency room on a vent and being transferred to ICU once out of ED.  Daughter just wants to know more information related to what was scene on bronch and why the sudden decline with her mother.  Dr. Loanne Drilling is off today, Dr. Tamala Julian did bronch procedure will route to him for further recommendations.

## 2019-10-01 ENCOUNTER — Inpatient Hospital Stay (HOSPITAL_COMMUNITY): Payer: Medicare Other

## 2019-10-01 DIAGNOSIS — E43 Unspecified severe protein-calorie malnutrition: Secondary | ICD-10-CM | POA: Insufficient documentation

## 2019-10-01 DIAGNOSIS — Z9911 Dependence on respirator [ventilator] status: Secondary | ICD-10-CM

## 2019-10-01 LAB — GLUCOSE, CAPILLARY
Glucose-Capillary: 110 mg/dL — ABNORMAL HIGH (ref 70–99)
Glucose-Capillary: 111 mg/dL — ABNORMAL HIGH (ref 70–99)
Glucose-Capillary: 112 mg/dL — ABNORMAL HIGH (ref 70–99)
Glucose-Capillary: 121 mg/dL — ABNORMAL HIGH (ref 70–99)
Glucose-Capillary: 133 mg/dL — ABNORMAL HIGH (ref 70–99)
Glucose-Capillary: 98 mg/dL (ref 70–99)

## 2019-10-01 LAB — POCT I-STAT 7, (LYTES, BLD GAS, ICA,H+H)
Bicarbonate: 27.7 mmol/L (ref 20.0–28.0)
Calcium, Ion: 1.26 mmol/L (ref 1.15–1.40)
HCT: 26 % — ABNORMAL LOW (ref 36.0–46.0)
Hemoglobin: 8.8 g/dL — ABNORMAL LOW (ref 12.0–15.0)
O2 Saturation: 93 %
Patient temperature: 98.5
Potassium: 4 mmol/L (ref 3.5–5.1)
Sodium: 131 mmol/L — ABNORMAL LOW (ref 135–145)
TCO2: 30 mmol/L (ref 22–32)
pCO2 arterial: 59.5 mmHg — ABNORMAL HIGH (ref 32.0–48.0)
pH, Arterial: 7.276 — ABNORMAL LOW (ref 7.350–7.450)
pO2, Arterial: 78 mmHg — ABNORMAL LOW (ref 83.0–108.0)

## 2019-10-01 LAB — BASIC METABOLIC PANEL
Anion gap: 10 (ref 5–15)
BUN: 32 mg/dL — ABNORMAL HIGH (ref 8–23)
CO2: 26 mmol/L (ref 22–32)
Calcium: 8.2 mg/dL — ABNORMAL LOW (ref 8.9–10.3)
Chloride: 96 mmol/L — ABNORMAL LOW (ref 98–111)
Creatinine, Ser: 1.39 mg/dL — ABNORMAL HIGH (ref 0.44–1.00)
GFR calc Af Amer: 40 mL/min — ABNORMAL LOW (ref >60)
GFR calc non Af Amer: 35 mL/min — ABNORMAL LOW (ref >60)
Glucose, Bld: 111 mg/dL — ABNORMAL HIGH (ref 70–99)
Potassium: 4.3 mmol/L (ref 3.5–5.1)
Sodium: 132 mmol/L — ABNORMAL LOW (ref 135–145)

## 2019-10-01 LAB — CBC
HCT: 26.5 % — ABNORMAL LOW (ref 36.0–46.0)
Hemoglobin: 7.7 g/dL — ABNORMAL LOW (ref 12.0–15.0)
MCH: 26.4 pg (ref 26.0–34.0)
MCHC: 29.1 g/dL — ABNORMAL LOW (ref 30.0–36.0)
MCV: 90.8 fL (ref 80.0–100.0)
Platelets: 200 10*3/uL (ref 150–400)
RBC: 2.92 MIL/uL — ABNORMAL LOW (ref 3.87–5.11)
RDW: 13.6 % (ref 11.5–15.5)
WBC: 12.2 10*3/uL — ABNORMAL HIGH (ref 4.0–10.5)
nRBC: 0 % (ref 0.0–0.2)

## 2019-10-01 LAB — MAGNESIUM
Magnesium: 1.4 mg/dL — ABNORMAL LOW (ref 1.7–2.4)
Magnesium: 2 mg/dL (ref 1.7–2.4)

## 2019-10-01 LAB — PHOSPHORUS
Phosphorus: 2.7 mg/dL (ref 2.5–4.6)
Phosphorus: 2.5 mg/dL (ref 2.5–4.6)

## 2019-10-01 MED ORDER — MAGNESIUM SULFATE 2 GM/50ML IV SOLN
2.0000 g | Freq: Once | INTRAVENOUS | Status: AC
  Administered 2019-10-01: 2 g via INTRAVENOUS
  Filled 2019-10-01: qty 50

## 2019-10-01 NOTE — Progress Notes (Signed)
Initial Nutrition Assessment  DOCUMENTATION CODES:   Severe malnutrition in context of chronic illness  INTERVENTION:   Tube feeding:  Vital High Protein @ 40 ml/hr via OGT (960 ml)  Provides: 960 kcals (1063 kcal with propofol), 84 grams protein, 803 ml free water.   NUTRITION DIAGNOSIS:   Severe Malnutrition related to chronic illness(CHF) as evidenced by severe fat depletion, severe muscle depletion, energy intake < or equal to 75% for > or equal to 1 month.  GOAL:   Patient will meet greater than or equal to 90% of their needs  MONITOR:   Weight trends, Labs, I & O's, Diet advancement, Vent status, Skin, TF tolerance  REASON FOR ASSESSMENT:   Consult Enteral/tube feeding initiation and management  ASSESSMENT:   Patient with PMH significant for HTN, CHF, AF, CKD, Chron's disease s/p bowel resection, and depression. Presents this admission with acute respiratory failure 2/2 pseudomonas PNA.   Pt discussed during ICU rounds and with RN.   Daily SBTs. Remains on small dose propofol. Started on Vital High Protein at 40 ml/hr. Tolerating well.   Spoke with husband at bedside. Reports pt has struggled with appetite for >3 months due to ongoing breathing difficulties. Recently had multiple dental extractions and can only eat "mushy" foods. Typically consumes three very small meals daily that consist of B- oatmeal, toast L- sandwich D- soup. Does not use supplementation as it causes diarrhea.   Admission weight: 42.2 kg  Current weight: 43 kg   Patient is currently intubated on ventilator support MV: 5.5 L/min Temp (24hrs), Avg:97.9 F (36.6 C), Min:96.8 F (36 C), Max:98.5 F (36.9 C)  Propofol: 3.9 ml/hr- provides 103 kcal daily    Drips: propofol Labs: Na 132 (L) Mg 1.4 (L) Cr 1.39- trending down    NUTRITION - FOCUSED PHYSICAL EXAM:    Most Recent Value  Orbital Region  Severe depletion  Upper Arm Region  Severe depletion  Thoracic and Lumbar Region  Unable  to assess  Buccal Region  Severe depletion  Temple Region  Severe depletion  Clavicle Bone Region  Severe depletion  Clavicle and Acromion Bone Region  Severe depletion  Scapular Bone Region  Unable to assess  Dorsal Hand  Unable to assess  Patellar Region  Severe depletion  Anterior Thigh Region  Severe depletion  Posterior Calf Region  Severe depletion  Edema (RD Assessment)  Mild  Hair  Reviewed  Eyes  Unable to assess  Mouth  Unable to assess  Skin  Reviewed  Nails  Unable to assess       Diet Order:   Diet Order            Diet NPO time specified  Diet effective now              EDUCATION NEEDS:   Not appropriate for education at this time  Skin:  Skin Assessment: Reviewed RN Assessment  Last BM:  3/11  Height:   Ht Readings from Last 1 Encounters:  09/28/19 4' 8"  (1.422 m)    Weight:   Wt Readings from Last 1 Encounters:  10/01/19 43 kg    BMI:  Body mass index is 21.25 kg/m.  Estimated Nutritional Needs:   Kcal:  1075-1290 kcal  Protein:  65-80 grams  Fluid:  >/= 1 L/day   Mariana Single RD, LDN Clinical Nutrition Pager listed in [place]

## 2019-10-01 NOTE — Progress Notes (Addendum)
NAME:  Melissa Mcdaniel, MRN:  [id number], DOB:  05/23/1933, LOS: 1 ADMISSION DATE:  10/12/2019, CONSULTATION DATE:  10/14/2019 REFERRING MD: Dr. Sabra Heck, CHIEF COMPLAINT: SOB  Brief History   86 y/o F who presented to [facility] on 3/10 with reports of increasing SOB, AMS.  Found to have hypercarbic respiratory failure in the setting of suspected RLL PNA as seen on CXR. Required intubation for respiratory distress.  Pt underwent FOB on 3/8 with cultures growing pan-sensitive pseudomonas.    Past Medical History  HTN AF Chronic Diastolic HF  HTN Chronic Hypoxic Respiratory Failure - new 2L O2 dependent with exertion  CKD  Chron's Disease  Depression   Significant Hospital Events   3/10 Admit   Consults:  PCCM   Procedures:  ETT 3/10 >>   Significant Diagnostic Tests:   HRCT 09/15/19 >> slight progression in the mid and lower lung zone predominant peribronchovascular nodularity & consolidation with associated bronchiectasis and peribronchial thickening, findings concerning for MAI, separate area of new nodularity in the RUL, no evidence of fibrotic interstitial lung disease, mild air trapping indicative of small airways disease, emphysema, aortic atherosclerosis  CXR 3/10 >> concern for new RLL airspace opacity with small right pleural effusion  Micro Data:  COVID 3/2 >> negative BAL 3/8 >> pseudomonas aeruginosa >> pansensitive  AFB 3/8 >> smear negative >>   COVID 3/10 >> negative  BCx2 3/10 >>   Antimicrobials:  Vanco 3/10  Cefepime 3/10 >>   Interim history/subjective:  RN reports pt weaning on PSV.  Propofol remains on 37mg No acute events overnight  Afebrile / WBC 12.2 Purewick in place with ~200 ml drainage in cannister, no output documented by staff   Objective   Blood pressure (!) 126/40, pulse 69, temperature 98.4 F (36.9 C), temperature source Oral, resp. rate (!) 23, weight 43 kg, SpO2 99 %.    Vent Mode: PSV;CPAP FiO2 (%):  [40 %] 40 % Set Rate:  [20 bmp]  20 bmp Vt Set:  [300 mL] 300 mL PEEP:  [5 cmH20] 5 cmH20 Pressure Support:  [10 cmH20] 10 cmH20 Plateau Pressure:  [11 cmH20-16 cmH20] 12 cmH20   Intake/Output Summary (Last 24 hours) at 10/01/2019 1209 Last data filed at 10/01/2019 1100 Gross per 24 hour  Intake 2071.49 ml  Output --  Net 2071.49 ml   Filed Weights   10/04/2019 0817 10/01/19 0440  Weight: 42.2 kg 43 kg    Examination: General: frail elderly female lying in bed on vent in NAD   HEENT: MM pink/moist, ETT, pupils 355mreactive Neuro: awakens to voice, nods, moves all ext spontaneously CV: s1s2 RRR, no m/r/g PULM:  Non-labored on vent, lungs bilaterally with coarse rhonchi  GI: soft, bsx4 active  Extremities: warm/dry, no edema  Skin: no rashes or lesions  Resolved Hospital Problem list      Assessment & Plan:   Pseudomonas Pneumonia  BAL from 3/8 with pansensitive pseudomonas.  Note RLL, LLL sites of BAL and concern for infection at time of FOB given appearance of secretions. AFB smear negative, await final cultures.   -continue ceftazidime  -follow AFB  Acute Hypercarbic Respiratory Failure secondary to Pseudomonas PNA in setting of Bronchiectasis  -PRVC 8cc/kg  -wean PEEP / FiO2 for sats 88-95% -follow intermittent CXR  -VAP prevention measures  -continue brovana + pulmicort  -SBT / WUA daily  -will need flutter valve post extubation given bronchiectasis & will need to use at home   Chronic Diastolic CHF  HTN  AF  -tele monitoring  -continue home amiodarone, coreg, hydralazine, coreg   AKI Hypomagnesemia  -Trend BMP / urinary output -Replace electrolytes as indicated, magnesium 3/11 -Avoid nephrotoxic agents, ensure adequate renal perfusion  Anemia  -trend CBC -continue home iron   Depression  -continue Effexor  -continue klonopin at reduced dose, may need reduced dose at discharge given recent weight loss and poor PO intake  Weight Loss  Moderate Protein Calorie Malnutrition  Recent  dental extractions for implants, possible MAI / infection  -Tube feed per nutrition   Best practice:  Diet: NPO Pain/Anxiety/Delirium protocol (if indicated): in place  VAP protocol (if indicated): in place  DVT prophylaxis: Eliquis  GI prophylaxis: PPI  Glucose control: n/a  Mobility: BR  Code Status: Full Code. Discussed with husband at bedside.  Family Communication: Husband called for update 3/11 on plan of care.  Disposition: ICU  Labs   CBC: Recent Labs  Lab 10/02/2019 0729 10/02/2019 0851 10/04/2019 2042 10/01/19 0407 10/01/19 0514  WBC 15.0*  --  8.6  --  12.2*  NEUTROABS 12.3*  --   --   --   --   HGB 10.1* 9.9* 8.0* 8.8* 7.7*  HCT 35.6* 29.0* 27.3* 26.0* 26.5*  MCV 92.5  --  89.8  --  90.8  PLT 362  --  197  --  109    Basic Metabolic Panel: Recent Labs  Lab 10/13/2019 0729 10/15/2019 0851 09/27/2019 1111 09/23/2019 2042 10/06/2019 2054 10/01/19 0407 10/01/19 0514  NA 128* 130*  --  129*  --  131* 132*  K 5.0 4.5  --  4.3  --  4.0 4.3  CL 93*  --   --  96*  --   --  96*  CO2 26  --   --  25  --   --  26  GLUCOSE 153*  --   --  107*  --   --  111*  BUN 26*  --   --  27*  --   --  32*  CREATININE 1.52*  --   --  1.50*  --   --  1.39*  CALCIUM 8.2*  --   --  7.8*  --   --  8.2*  MG  --   --  1.5*  --  1.4*  --  1.4*  PHOS  --   --  2.8  --  2.8  --  2.7   GFR: Estimated Creatinine Clearance: 17.3 mL/min (A) (by C-G formula based on SCr of 1.39 mg/dL (H)). Recent Labs  Lab 10/11/2019 0729 10/04/2019 0740 10/07/2019 1030 10/11/2019 2042 10/01/19 0514  WBC 15.0*  --   --  8.6 12.2*  LATICACIDVEN  --  1.0 1.2  --   --     Liver Function Tests: Recent Labs  Lab 09/23/2019 0729 10/13/2019 2042  AST 36 61*  ALT 34 43  ALKPHOS 81 59  BILITOT 0.6 0.4  PROT 7.0 5.3*  ALBUMIN 3.0* 2.2*   No results for input(s): LIPASE, AMYLASE in the last 168 hours. No results for input(s): AMMONIA in the last 168 hours.  ABG    Component Value Date/Time   PHART 7.276 (L)  10/01/2019 0407   PCO2ART 59.5 (H) 10/01/2019 0407   PO2ART 78.0 (L) 10/01/2019 0407   HCO3 27.7 10/01/2019 0407   TCO2 30 10/01/2019 0407   O2SAT 93.0 10/01/2019 0407     Coagulation Profile: Recent Labs  Lab 09/22/2019 0729  INR 1.3*  Cardiac Enzymes: No results for input(s): CKTOTAL, CKMB, CKMBINDEX, TROPONINI in the last 168 hours.  HbA1C: No results found for: HGBA1C  CBG: Recent Labs  Lab 10/20/2019 1525 09/27/2019 2017 10/01/19 0037 10/01/19 0342 10/01/19 0843  GLUCAP 93 94 98 111* 110*      Critical care time: 33 minutes     Noe Gens, MSN, NP-C [facility] 10/01/2019, 12:09 PM   Please see [email or web address] for pager details.    PCCM:  86 year old female admitted for hypercapnic respiratory failure.  Intubated mechanical life support.  Right lower lobe pneumonia recent bronchoscopy with Pseudomonas.  BP (!) 145/52   Pulse 73   Temp 98.5 F (36.9 C) (Oral)   Resp (!) 24   Wt 43 kg   SpO2 97%   BMI 21.25 kg/m  General: Elderly family antivenom, life support Lungs: Bilateral anterior rhonchi, ventilated breath sounds Heart: Regular rhythm S1-S2  Labs: Reviewed, cultures Pseudomonas  Chest x-ray: Right basilar infiltrate consistent with pneumonia. History of emphysema  Assessment: Pseudomonal pneumonia right lower lobe Acute hypoxemic hypercarbic respiratory failure Acute exacerbation of bronchiectasis Acute on chronic diastolic heart failure AKI Weight loss  Plan: Remains on mechanical support Continue ceftazidime Adult mechanical ventilator protocol Continue nebs Repeat chest x-ray in a.m. Consider extubation potentially as early as tomorrow.  This patient is critically ill with multiple organ system failure; which, requires frequent high complexity decision making, assessment, support, evaluation, and titration of therapies. This was completed through the application of advanced monitoring technologies and extensive  interpretation of multiple databases. During this encounter critical care time was devoted to patient care services described in this note for 31 minutes.  Garner Nash, DO [facility] 10/01/2019 5:21 PM

## 2019-10-02 ENCOUNTER — Ambulatory Visit: Payer: Medicare Other | Admitting: Internal Medicine

## 2019-10-02 ENCOUNTER — Inpatient Hospital Stay (HOSPITAL_COMMUNITY): Payer: Medicare Other

## 2019-10-02 DIAGNOSIS — E43 Unspecified severe protein-calorie malnutrition: Secondary | ICD-10-CM

## 2019-10-02 LAB — CBC
HCT: 30.2 % — ABNORMAL LOW (ref 36.0–46.0)
Hemoglobin: 9 g/dL — ABNORMAL LOW (ref 12.0–15.0)
MCH: 26.9 pg (ref 26.0–34.0)
MCHC: 29.8 g/dL — ABNORMAL LOW (ref 30.0–36.0)
MCV: 90.4 fL (ref 80.0–100.0)
Platelets: 220 10*3/uL (ref 150–400)
RBC: 3.34 MIL/uL — ABNORMAL LOW (ref 3.87–5.11)
RDW: 13.6 % (ref 11.5–15.5)
WBC: 12.2 10*3/uL — ABNORMAL HIGH (ref 4.0–10.5)
nRBC: 0 % (ref 0.0–0.2)

## 2019-10-02 LAB — COMPREHENSIVE METABOLIC PANEL
ALT: 31 U/L (ref 0–44)
AST: 24 U/L (ref 15–41)
Albumin: 2.1 g/dL — ABNORMAL LOW (ref 3.5–5.0)
Alkaline Phosphatase: 75 U/L (ref 38–126)
Anion gap: 10 (ref 5–15)
BUN: 34 mg/dL — ABNORMAL HIGH (ref 8–23)
CO2: 28 mmol/L (ref 22–32)
Calcium: 8.4 mg/dL — ABNORMAL LOW (ref 8.9–10.3)
Chloride: 96 mmol/L — ABNORMAL LOW (ref 98–111)
Creatinine, Ser: 1.17 mg/dL — ABNORMAL HIGH (ref 0.44–1.00)
GFR calc Af Amer: 50 mL/min — ABNORMAL LOW (ref >60)
GFR calc non Af Amer: 43 mL/min — ABNORMAL LOW (ref >60)
Glucose, Bld: 117 mg/dL — ABNORMAL HIGH (ref 70–99)
Potassium: 4.3 mmol/L (ref 3.5–5.1)
Sodium: 134 mmol/L — ABNORMAL LOW (ref 135–145)
Total Bilirubin: 0.4 mg/dL (ref 0.3–1.2)
Total Protein: 5.3 g/dL — ABNORMAL LOW (ref 6.5–8.1)

## 2019-10-02 LAB — GLUCOSE, CAPILLARY
Glucose-Capillary: 101 mg/dL — ABNORMAL HIGH (ref 70–99)
Glucose-Capillary: 104 mg/dL — ABNORMAL HIGH (ref 70–99)
Glucose-Capillary: 109 mg/dL — ABNORMAL HIGH (ref 70–99)
Glucose-Capillary: 112 mg/dL — ABNORMAL HIGH (ref 70–99)
Glucose-Capillary: 114 mg/dL — ABNORMAL HIGH (ref 70–99)
Glucose-Capillary: 137 mg/dL — ABNORMAL HIGH (ref 70–99)

## 2019-10-02 LAB — HEMOGLOBIN A1C
Hgb A1c MFr Bld: 5.1 % (ref 4.8–5.6)
Mean Plasma Glucose: 99.67 mg/dL

## 2019-10-02 LAB — TRIGLYCERIDES: Triglycerides: 62 mg/dL (ref <150)

## 2019-10-02 MED ORDER — GERHARDT'S BUTT CREAM
TOPICAL_CREAM | Freq: Three times a day (TID) | CUTANEOUS | Status: DC
  Administered 2019-10-06 (×2): 1 via TOPICAL
  Filled 2019-10-02: qty 1

## 2019-10-02 MED ORDER — INSULIN ASPART 100 UNIT/ML ~~LOC~~ SOLN
0.0000 [IU] | SUBCUTANEOUS | Status: DC
  Administered 2019-10-02 – 2019-10-03 (×3): 1 [IU] via SUBCUTANEOUS
  Administered 2019-10-04: 3 [IU] via SUBCUTANEOUS
  Administered 2019-10-05 (×3): 1 [IU] via SUBCUTANEOUS
  Administered 2019-10-05: 2 [IU] via SUBCUTANEOUS
  Administered 2019-10-05 – 2019-10-06 (×2): 1 [IU] via SUBCUTANEOUS
  Administered 2019-10-06 (×2): 2 [IU] via SUBCUTANEOUS
  Administered 2019-10-06: 1 [IU] via SUBCUTANEOUS
  Administered 2019-10-06: 2 [IU] via SUBCUTANEOUS
  Administered 2019-10-06 – 2019-10-07 (×3): 1 [IU] via SUBCUTANEOUS
  Administered 2019-10-07 (×2): 2 [IU] via SUBCUTANEOUS

## 2019-10-02 NOTE — Progress Notes (Addendum)
Pharmacy Antibiotic Note  Melissa Mcdaniel is a 86 y.o. female admitted on 09/29/2019 with sepsis, concern for pna.  Pharmacy has been consulted for ceftazidime dosing after found to have pseudomonal pneumonia.   Patient currently afebrile, wbc 12, scr normal at 1.1. Other cultures no growth.   Plan: Continue ceftazidime 1g q24 for now Do not expect any dose adjustments at this time, pharmacy to sign off and follow peripherally   Weight: 95 lb 7.4 oz (43.3 kg)  Temp (24hrs), Avg:98.2 F (36.8 C), Min:97.8 F (36.6 C), Max:98.7 F (37.1 C)  Recent Labs  Lab 10/06/2019 0729 10/08/2019 0740 10/06/2019 1030 10/15/2019 2042 10/01/19 0514 10/02/19 0221  WBC 15.0*  --   --  8.6 12.2* 12.2*  CREATININE 1.52*  --   --  1.50* 1.39* 1.17*  LATICACIDVEN  --  1.0 1.2  --   --   --     Estimated Creatinine Clearance: 20.5 mL/min (A) (by C-G formula based on SCr of 1.17 mg/dL (H)).    Allergies  Allergen Reactions  . Penicillins Swelling    Did it involve swelling of the face/tongue/throat, SOB, or low BP? Yes Did it involve sudden or severe rash/hives, skin peeling, or any reaction on the inside of your mouth or nose? No Did you need to seek medical attention at a hospital or doctor's office? No When did it last happen?childhood allergy If all above answers are "NO", may proceed with cephalosporin use.   . Levothyroxine Nausea And Vomiting    Antimicrobials this admission: Vanc 3/10>>3/10 Cefepime 3/10>>3/10 Ceftazidime 3/10>>  Microbiology results: 3/10 BCx: ngtd 3/8 resp cx: pseudomonas - pan S  Erin Hearing PharmD., BCPS Clinical Pharmacist 10/02/2019 2:19 PM

## 2019-10-02 NOTE — Progress Notes (Addendum)
NAME:  Melissa Mcdaniel, MRN:  [id number], DOB:  [birth year], LOS: 2 ADMISSION DATE:  10/04/2019, CONSULTATION DATE:  10/20/2019 REFERRING MD: Dr. Sabra Heck, CHIEF COMPLAINT: SOB  Brief History   86 y/o F who presented to [facility] on 3/10 with reports of increasing SOB, AMS.  Found to have hypercarbic respiratory failure in the setting of suspected RLL PNA as seen on CXR. Required intubation for respiratory distress.  Pt underwent FOB on 3/8 with cultures growing pan-sensitive pseudomonas.    Past Medical History  HTN AF Chronic Diastolic HF  HTN Chronic Hypoxic Respiratory Failure - new 2L O2 dependent with exertion  CKD  Chron's Disease  Depression   Significant Hospital Events   3/10 Admit   Consults:  PCCM   Procedures:  ETT 3/10 >>   Significant Diagnostic Tests:   HRCT 09/15/19 >> slight progression in the mid and lower lung zone predominant peribronchovascular nodularity & consolidation with associated bronchiectasis and peribronchial thickening, findings concerning for MAI, separate area of new nodularity in the RUL, no evidence of fibrotic interstitial lung disease, mild air trapping indicative of small airways disease, emphysema, aortic atherosclerosis  CXR 3/10 >> concern for new RLL airspace opacity with small right pleural effusion  Micro Data:  COVID 3/2 >> negative BAL 3/8 >> pseudomonas aeruginosa >> pansensitive  AFB 3/8 >> smear negative >>   COVID 3/10 >> negative  BCx2 3/10 >>   Antimicrobials:  Vanco 3/10  Cefepime 3/10 >> 3/11 Ceftazidime 3/11 >>  Interim history/subjective:  Rn reports no acute events overnight, currently tolerating SBT 5/10 on propofol drip   Objective   Blood pressure (!) 163/61, pulse 68, temperature 97.8 F (36.6 C), temperature source Oral, resp. rate 15, weight 43.3 kg, SpO2 98 %.    Vent Mode: PSV;CPAP FiO2 (%):  [40 %] 40 % Set Rate:  [20 bmp] 20 bmp Vt Set:  [300 mL] 300 mL PEEP:  [5 cmH20] 5 cmH20 Pressure Support:  [10  cmH20] 10 cmH20 Plateau Pressure:  [11 cmH20-15 cmH20] 15 cmH20   Intake/Output Summary (Last 24 hours) at 10/02/2019 0929 Last data filed at 10/02/2019 0600 Gross per 24 hour  Intake 1366.42 ml  Output 400 ml  Net 966.42 ml   Filed Weights   10/21/2019 0817 10/01/19 0440 10/02/19 0600  Weight: 42.2 kg 43 kg 43.3 kg    Examination: General: Chronically ill appearing elderly deconditioned female on mechanical ventilation, in NAD HEENT: ETT, MM pink/moist, PERRL,  Neuro: Will respond to verbal stimuli, purposful movement seen in upper extremities  CV: s1s2 regular rate and rhythm, no murmur, rubs, or gallops,  PULM:  Faint rhonchi bilaterally, remains on vent 40 FiO@ with oxygen saturations 98-100, no increased work of breathing  GI: soft, bowel sounds active in all 4 quadrants, non-tender, non-distended, tolerating TF Extremities: warm/dry, no edema  Skin: no rashes or lesions  Resolved Hospital Problem list    Hypomagnesia   Assessment & Plan:   Pseudomonas Pneumonia  -BAL from 3/8 with pansensitive pseudomonas.  Note RLL, LLL sites of BAL and concern for infection at time of FOB given appearance of secretions. AFB smear negative, await final cultures.   P: Continue IV Ceftazidine  Follow AFB  Pulmonary hygiene   Acute Hypercarbic Respiratory Failure secondary to Pseudomonas PNA in setting of Bronchiectasis  P: Continue ventilator support with lung protective strategies  Currently tolerating SBT, hopefully for extubation soon, mentation preclude extubation  Wean PEEP and FiO2 for sats greater than 90%. Head of  bed elevated 30 degrees. Plateau pressures less than 30 cm H20.  Follow intermittent chest x-ray and ABG.   Ensure adequate pulmonary hygiene  Follow cultures  VAP bundle in place  PAD protocol Continue Brovana + Pulmicort  Flutter valve post extubation   Chronic Diastolic CHF  HTN  AF  P: Continuous telemetry  Continue home Amiodarone, Coreg, Hydralizine,    Daily weight  Strict intake and output   AKI P: Follow renal function / urine output Trend Bmet Avoid nephrotoxins, ensure adequate renal perfusion  IV hydration Obtain urine lytes   Anemia  P: Trend CC Continue home iron  Hgb goal > 8  Depression  P: Continue Effexor  Continue Klonopin at reduced dose   Weight Loss  Moderate Protein Calorie Malnutrition  -Recent dental extractions for implants, possible MAI / infection  P: Continue TF Dietitian following    Best practice:  Diet: TF Pain/Anxiety/Delirium protocol (if indicated): in place  VAP protocol (if indicated): in place  DVT prophylaxis: Eliquis  GI prophylaxis: PPI  Glucose control: n/a  Mobility: BR  Code Status: Full Code. Discussed with husband at bedside.  Family Communication: Attempted to call husband but he is currently in route to hospital will update once spouse is at bedside  Disposition: ICU  Labs   CBC: Recent Labs  Lab 10/12/2019 0729 09/24/2019 0729 10/20/2019 0851 10/02/2019 2042 10/01/19 0407 10/01/19 0514 10/02/19 0221  WBC 15.0*  --   --  8.6  --  12.2* 12.2*  NEUTROABS 12.3*  --   --   --   --   --   --   HGB 10.1*   < > 9.9* 8.0* 8.8* 7.7* 9.0*  HCT 35.6*   < > 29.0* 27.3* 26.0* 26.5* 30.2*  MCV 92.5  --   --  89.8  --  90.8 90.4  PLT 362  --   --  197  --  200 220   < > = values in this interval not displayed.    Basic Metabolic Panel: Recent Labs  Lab 10/06/2019 0729 10/06/2019 0729 10/08/2019 0851 10/17/2019 1111 10/11/2019 2042 10/08/2019 2054 10/01/19 0407 10/01/19 0514 10/01/19 1632 10/02/19 0221  NA 128*   < > 130*  --  129*  --  131* 132*  --  134*  K 5.0   < > 4.5  --  4.3  --  4.0 4.3  --  4.3  CL 93*  --   --   --  96*  --   --  96*  --  96*  CO2 26  --   --   --  25  --   --  26  --  28  GLUCOSE 153*  --   --   --  107*  --   --  111*  --  117*  BUN 26*  --   --   --  27*  --   --  32*  --  34*  CREATININE 1.52*  --   --   --  1.50*  --   --  1.39*  --  1.17*   CALCIUM 8.2*  --   --   --  7.8*  --   --  8.2*  --  8.4*  MG  --   --   --  1.5*  --  1.4*  --  1.4* 2.0  --   PHOS  --   --   --  2.8  --  2.8  --  2.7 2.5  --    < > = values in this interval not displayed.   GFR: Estimated Creatinine Clearance: 20.5 mL/min (A) (by C-G formula based on SCr of 1.17 mg/dL (H)). Recent Labs  Lab 10/02/2019 0729 10/18/2019 0740 10/20/2019 1030 09/29/2019 2042 10/01/19 0514 10/02/19 0221  WBC 15.0*  --   --  8.6 12.2* 12.2*  LATICACIDVEN  --  1.0 1.2  --   --   --     Liver Function Tests: Recent Labs  Lab 10/07/2019 0729 09/28/2019 2042 10/02/19 0221  AST 36 61* 24  ALT 34 43 31  ALKPHOS 81 59 75  BILITOT 0.6 0.4 0.4  PROT 7.0 5.3* 5.3*  ALBUMIN 3.0* 2.2* 2.1*   No results for input(s): LIPASE, AMYLASE in the last 168 hours. No results for input(s): AMMONIA in the last 168 hours.  ABG    Component Value Date/Time   PHART 7.276 (L) 10/01/2019 0407   PCO2ART 59.5 (H) 10/01/2019 0407   PO2ART 78.0 (L) 10/01/2019 0407   HCO3 27.7 10/01/2019 0407   TCO2 30 10/01/2019 0407   O2SAT 93.0 10/01/2019 0407     Coagulation Profile: Recent Labs  Lab 10/21/2019 0729  INR 1.3*    Cardiac Enzymes: No results for input(s): CKTOTAL, CKMB, CKMBINDEX, TROPONINI in the last 168 hours.  HbA1C: No results found for: HGBA1C  CBG: Recent Labs  Lab 10/01/19 1631 10/01/19 2039 10/02/19 0044 10/02/19 0342 10/02/19 0811  GLUCAP 121* 133* 109* 114* 101*      Critical care time: 33 minutes     Performed by: Johnsie Cancel  Total critical care time: 35 minutes  Critical care time was exclusive of separately billable procedures and treating other patients.  Critical care was necessary to treat or prevent imminent or life-threatening deterioration.  Critical care was time spent personally by me on the following activities: development of treatment plan with patient and/or surrogate as well as nursing, discussions with consultants, evaluation of  patient's response to treatment, examination of patient, obtaining history from patient or surrogate, ordering and performing treatments and interventions, ordering and review of laboratory studies, ordering and review of radiographic studies, pulse oximetry and re-evaluation of patient's condition.  Johnsie Cancel, NP-C [facility] Contact / Pager information can be found on Amion  10/02/2019, 9:46 AM   PCCM attending:  86 year old female admitted for acute hypoxemic respiratory failure, exacerbation of bronchiectasis pseudomonal pneumonia intubated on mechanical life support.  BP (!) 117/44   Pulse 72   Temp 97.8 F (36.6 C) (Oral)   Resp (!) 23   Wt 43.3 kg   SpO2 100%   BMI 21.40 kg/m   General: Elderly female cachectic, debilitated Neck: Trachea midline, JVD visible, thin, supraclavicular fat pad loss Lungs: Bilateral mechanical ventilated breath sounds Heart: Regular rate rhythm S1-S2 Abdomen: Soft nontender nondistended  Labs: Reviewed Chest x-ray: Reviewed  Assessment: Pseudomonal pneumonia right lower lobe Acute hypoxic respiratory failure requiring intubation mechanical ventilation Bronchiectasis exacerbation  Plan: Continue ceftazidime Remains on adult mechanical vent support PAD protocol for sedation Continue bronchodilators Currently too sedated to consider liberation from vent. Wean sedation early in the morning. SBT in the morning Would like to consider extubation tomorrow morning.  This patient is critically ill with multiple organ system failure; which, requires frequent high complexity decision making, assessment, support, evaluation, and titration of therapies. This was completed through the application of advanced monitoring technologies and extensive interpretation of  multiple databases. During this encounter critical care time was devoted to patient care services described in this note for 32 minutes.   Garner Nash,  DO [facility] 10/02/2019 3:26 PM

## 2019-10-03 LAB — BASIC METABOLIC PANEL
Anion gap: 9 (ref 5–15)
BUN: 38 mg/dL — ABNORMAL HIGH (ref 8–23)
CO2: 28 mmol/L (ref 22–32)
Calcium: 8.6 mg/dL — ABNORMAL LOW (ref 8.9–10.3)
Chloride: 95 mmol/L — ABNORMAL LOW (ref 98–111)
Creatinine, Ser: 1.03 mg/dL — ABNORMAL HIGH (ref 0.44–1.00)
GFR calc Af Amer: 58 mL/min — ABNORMAL LOW (ref >60)
GFR calc non Af Amer: 50 mL/min — ABNORMAL LOW (ref >60)
Glucose, Bld: 113 mg/dL — ABNORMAL HIGH (ref 70–99)
Potassium: 4.4 mmol/L (ref 3.5–5.1)
Sodium: 132 mmol/L — ABNORMAL LOW (ref 135–145)

## 2019-10-03 LAB — CBC
HCT: 28 % — ABNORMAL LOW (ref 36.0–46.0)
Hemoglobin: 8.3 g/dL — ABNORMAL LOW (ref 12.0–15.0)
MCH: 26.9 pg (ref 26.0–34.0)
MCHC: 29.6 g/dL — ABNORMAL LOW (ref 30.0–36.0)
MCV: 90.6 fL (ref 80.0–100.0)
Platelets: 269 10*3/uL (ref 150–400)
RBC: 3.09 MIL/uL — ABNORMAL LOW (ref 3.87–5.11)
RDW: 13.8 % (ref 11.5–15.5)
WBC: 9.3 10*3/uL (ref 4.0–10.5)
nRBC: 0 % (ref 0.0–0.2)

## 2019-10-03 LAB — GLUCOSE, CAPILLARY
Glucose-Capillary: 125 mg/dL — ABNORMAL HIGH (ref 70–99)
Glucose-Capillary: 117 mg/dL — ABNORMAL HIGH (ref 70–99)
Glucose-Capillary: 131 mg/dL — ABNORMAL HIGH (ref 70–99)
Glucose-Capillary: 108 mg/dL — ABNORMAL HIGH (ref 70–99)
Glucose-Capillary: 110 mg/dL — ABNORMAL HIGH (ref 70–99)

## 2019-10-03 MED ORDER — SODIUM CHLORIDE 0.9 % IV SOLN
1.0000 g | Freq: Two times a day (BID) | INTRAVENOUS | Status: DC
  Administered 2019-10-03 – 2019-10-04 (×3): 1 g via INTRAVENOUS
  Filled 2019-10-03 (×7): qty 1

## 2019-10-03 MED ORDER — CHLORHEXIDINE GLUCONATE 0.12 % MT SOLN
15.0000 mL | Freq: Two times a day (BID) | OROMUCOSAL | Status: DC
  Administered 2019-10-03: 15 mL via OROMUCOSAL
  Filled 2019-10-03 (×2): qty 15

## 2019-10-03 MED ORDER — ORAL CARE MOUTH RINSE
15.0000 mL | Freq: Two times a day (BID) | OROMUCOSAL | Status: DC
  Administered 2019-10-03 – 2019-10-07 (×6): 15 mL via OROMUCOSAL

## 2019-10-03 MED ORDER — HALOPERIDOL LACTATE 5 MG/ML IJ SOLN
2.0000 mg | Freq: Four times a day (QID) | INTRAMUSCULAR | Status: DC | PRN
  Administered 2019-10-03 (×2): 2 mg via INTRAVENOUS
  Filled 2019-10-03 (×2): qty 1

## 2019-10-03 MED ORDER — LABETALOL HCL 5 MG/ML IV SOLN
10.0000 mg | INTRAVENOUS | Status: DC | PRN
  Administered 2019-10-03 – 2019-10-04 (×5): 10 mg via INTRAVENOUS
  Filled 2019-10-03 (×4): qty 4

## 2019-10-03 NOTE — Progress Notes (Signed)
Pt agitated since extubation, confused, moaning and not performing pulmonary toileting appropriately. SBP also in 170s-190s. Paged Dr. Tamala Julian, orders for Haldol and Labetalol as needed.   Melissa Mcdaniel, 10/03/2019 1300

## 2019-10-03 NOTE — Progress Notes (Signed)
CPT held due to patient in recliner. Well attempt once she has returned to the bed.

## 2019-10-03 NOTE — Progress Notes (Signed)
NAME:  Melissa Mcdaniel, MRN:  [id number], DOB:  [birth year], LOS: 3 ADMISSION DATE:  10/19/2019, CONSULTATION DATE:  09/22/2019 REFERRING MD: Dr. Sabra Heck, CHIEF COMPLAINT: SOB  Brief History   86 y/o F who presented to [facility] on 3/10 with reports of increasing SOB, AMS.  Found to have hypercarbic respiratory failure in the setting of suspected RLL PNA as seen on CXR. Required intubation for respiratory distress.  Pt underwent FOB on 3/8 with cultures growing pan-sensitive pseudomonas.    Past Medical History  HTN AF Chronic Diastolic HF  HTN Chronic Hypoxic Respiratory Failure - new 2L O2 dependent with exertion  CKD  Chron's Disease  Depression   Significant Hospital Events   3/10 Admit   Consults:  PCCM   Procedures:  ETT 3/10 >>   Significant Diagnostic Tests:   HRCT 09/15/19 >> slight progression in the mid and lower lung zone predominant peribronchovascular nodularity & consolidation with associated bronchiectasis and peribronchial thickening, findings concerning for MAI, separate area of new nodularity in the RUL, no evidence of fibrotic interstitial lung disease, mild air trapping indicative of small airways disease, emphysema, aortic atherosclerosis  CXR 3/10 >> concern for new RLL airspace opacity with small right pleural effusion  Micro Data:  COVID 3/2 >> negative BAL 3/8 >> pseudomonas aeruginosa >> pansensitive  AFB 3/8 >> smear negative >>   COVID 3/10 >> negative  BCx2 3/10 >>   Antimicrobials:  Vanco 3/10  Cefepime 3/10 >> 3/11 Ceftazidime 3/11 >>  Interim history/subjective:  Awake alert on PS, does not want ETT out.   Objective   Blood pressure 131/62, pulse 80, temperature 98.2 F (36.8 C), temperature source Axillary, resp. rate 16, weight 47.2 kg, SpO2 99 %.    Vent Mode: PSV;CPAP FiO2 (%):  [40 %] 40 % Set Rate:  [20 bmp] 20 bmp Vt Set:  [300 mL] 300 mL PEEP:  [5 cmH20] 5 cmH20 Pressure Support:  [10 cmH20] 10 cmH20 Plateau Pressure:  [14  cmH20-20 cmH20] 14 cmH20   Intake/Output Summary (Last 24 hours) at 10/03/2019 0973 Last data filed at 10/03/2019 0600 Gross per 24 hour  Intake 1247.83 ml  Output 990 ml  Net 257.83 ml   Filed Weights   10/01/19 0440 10/02/19 0600 10/03/19 0230  Weight: 43 kg 43.3 kg 47.2 kg    Examination: GEN: thin elderly woman on vent HEENT: ETT in place, no secretions CV: RRR, ext warm PULM: scattered rhonci particularly GI: Soft, +BS EXT: Muscle wasting without edema NEURO: Following commands PSYCH: Seems confused SKIN: No rashes  Cr improved WBC improved HgB stable Sugars okay   Resolved Hospital Problem list    Hypomagnesia   Assessment & Plan:   Pseudomonas Pneumonia  -BAL from 3/8 with pansensitive pseudomonas.  Note RLL, LLL sites of BAL and concern for infection at time of FOB given appearance of secretions. AFB smear negative, await final cultures.   P: Continue IV Ceftazidine  Follow AFB  Pulmonary hygiene   Acute Hypercarbic Respiratory Failure secondary to Pseudomonas PNA in setting of Bronchiectasis  P: Improved She seems a bit confused, suspect delirium Husband to come in to help with communication and reassurance Will extubate and work on pulmonary hygeine  Chronic Diastolic CHF  HTN  AF  P: Continuous telemetry  Continue home Amiodarone, Coreg, Hydralizine,  Daily weight  Strict intake and output   AKI P: Follow renal function / urine output Trend Bmet Avoid nephrotoxins, ensure adequate renal perfusion   Anemia  P:  Trend CC Continue home iron  Hgb goal > 8  Depression  P: Continue Effexor  Continue Klonopin at reduced dose   Weight Loss  Moderate Protein Calorie Malnutrition  -Recent dental extractions for implants, possible MAI / infection  P: Continue TF Dietitian following    Best practice:  Diet: TF Pain/Anxiety/Delirium protocol (if indicated): in place  VAP protocol (if indicated): in place  DVT prophylaxis: Eliquis  GI  prophylaxis: PPI  Glucose control: n/a  Mobility: BR  Code Status: Full Code. Discussed with husband at bedside.  Family Communication: Will talk with husband when here Disposition: ICU    The patient is critically ill with multiple organ systems failure and requires high complexity decision making for assessment and support, frequent evaluation and titration of therapies, application of advanced monitoring technologies and extensive interpretation of multiple databases. Critical Care Time devoted to patient care services described in this note independent of APP/resident time (if applicable)  is 32 minutes.   Erskine Emery MD [facility] 10/03/2019 8:25 AM Personal pager: [phone] If unanswered, please page CCM On-call: [phone]

## 2019-10-03 NOTE — Progress Notes (Signed)
New orders for CPT Q4 on patient.  Attempted to do CPT through bed however patient complained that it was hurting her therefore stopped it after 2 minutes.  Attempted to do manual CPT on patient however patient stated that that hurt her as well.  No sure if patient able to perform flutter valve.  Will attempt again.

## 2019-10-03 NOTE — Progress Notes (Signed)
RT note: patient placed on CPAP/PSV of 10/5 at 0751.  Currently tolerating well.  Will continue to monitor.

## 2019-10-03 NOTE — Procedures (Signed)
Extubation Procedure Note  Patient Details:   Name: Melissa Mcdaniel DOB: 12/17/1932 MRN: [id number]   Airway Documentation:    Vent end date: 10/03/19 Vent end time: 1035   Evaluation  O2 sats: stable throughout Complications: No apparent complications Patient did tolerate procedure well. Bilateral Breath Sounds: Rhonchi, Diminished   Yes   Patient extubated to 2L nasal cannula per MD order.  Positive cuff leak noted.  No evidence of stridor.  Patient able to speak post extubation.  Sats currently 95% and vitals are stable.  No complications noted.    Judith Part 10/03/2019, 10:40 AM

## 2019-10-04 ENCOUNTER — Inpatient Hospital Stay (HOSPITAL_COMMUNITY): Payer: Medicare Other

## 2019-10-04 ENCOUNTER — Inpatient Hospital Stay: Payer: Self-pay

## 2019-10-04 LAB — CBC
HCT: 28.1 % — ABNORMAL LOW (ref 36.0–46.0)
Hemoglobin: 8.1 g/dL — ABNORMAL LOW (ref 12.0–15.0)
MCH: 26.3 pg (ref 26.0–34.0)
MCHC: 28.8 g/dL — ABNORMAL LOW (ref 30.0–36.0)
MCV: 91.2 fL (ref 80.0–100.0)
Platelets: 231 10*3/uL (ref 150–400)
RBC: 3.08 MIL/uL — ABNORMAL LOW (ref 3.87–5.11)
RDW: 13.9 % (ref 11.5–15.5)
WBC: 5.7 10*3/uL (ref 4.0–10.5)
nRBC: 0 % (ref 0.0–0.2)

## 2019-10-04 LAB — POCT I-STAT 7, (LYTES, BLD GAS, ICA,H+H)
Acid-Base Excess: 7 mmol/L — ABNORMAL HIGH (ref 0.0–2.0)
Acid-Base Excess: 8 mmol/L — ABNORMAL HIGH (ref 0.0–2.0)
Acid-Base Excess: 6 mmol/L — ABNORMAL HIGH (ref 0.0–2.0)
Bicarbonate: 34.1 mmol/L — ABNORMAL HIGH (ref 20.0–28.0)
Bicarbonate: 37.4 mmol/L — ABNORMAL HIGH (ref 20.0–28.0)
Bicarbonate: 34 mmol/L — ABNORMAL HIGH (ref 20.0–28.0)
Calcium, Ion: 1.3 mmol/L (ref 1.15–1.40)
Calcium, Ion: 1.33 mmol/L (ref 1.15–1.40)
Calcium, Ion: 1.31 mmol/L (ref 1.15–1.40)
HCT: 26 % — ABNORMAL LOW (ref 36.0–46.0)
HCT: 28 % — ABNORMAL LOW (ref 36.0–46.0)
HCT: 23 % — ABNORMAL LOW (ref 36.0–46.0)
Hemoglobin: 8.8 g/dL — ABNORMAL LOW (ref 12.0–15.0)
Hemoglobin: 9.5 g/dL — ABNORMAL LOW (ref 12.0–15.0)
Hemoglobin: 7.8 g/dL — ABNORMAL LOW (ref 12.0–15.0)
O2 Saturation: 90 %
O2 Saturation: 100 %
O2 Saturation: 98 %
Patient temperature: 98.6
Potassium: 4.5 mmol/L (ref 3.5–5.1)
Potassium: 5.1 mmol/L (ref 3.5–5.1)
Potassium: 4.8 mmol/L (ref 3.5–5.1)
Sodium: 134 mmol/L — ABNORMAL LOW (ref 135–145)
Sodium: 135 mmol/L (ref 135–145)
Sodium: 137 mmol/L (ref 135–145)
TCO2: 36 mmol/L — ABNORMAL HIGH (ref 22–32)
TCO2: 40 mmol/L — ABNORMAL HIGH (ref 22–32)
TCO2: 36 mmol/L — ABNORMAL HIGH (ref 22–32)
pCO2 arterial: 64.3 mmHg — ABNORMAL HIGH (ref 32.0–48.0)
pCO2 arterial: 92.2 mmHg (ref 32.0–48.0)
pCO2 arterial: 71.4 mmHg (ref 32.0–48.0)
pH, Arterial: 7.332 — ABNORMAL LOW (ref 7.350–7.450)
pH, Arterial: 7.217 — ABNORMAL LOW (ref 7.350–7.450)
pH, Arterial: 7.286 — ABNORMAL LOW (ref 7.350–7.450)
pO2, Arterial: 66 mmHg — ABNORMAL LOW (ref 83.0–108.0)
pO2, Arterial: 346 mmHg — ABNORMAL HIGH (ref 83.0–108.0)
pO2, Arterial: 117 mmHg — ABNORMAL HIGH (ref 83.0–108.0)

## 2019-10-04 LAB — MAGNESIUM
Magnesium: 1.9 mg/dL (ref 1.7–2.4)
Magnesium: 1.9 mg/dL (ref 1.7–2.4)

## 2019-10-04 LAB — GLUCOSE, CAPILLARY
Glucose-Capillary: 100 mg/dL — ABNORMAL HIGH (ref 70–99)
Glucose-Capillary: 111 mg/dL — ABNORMAL HIGH (ref 70–99)
Glucose-Capillary: 118 mg/dL — ABNORMAL HIGH (ref 70–99)
Glucose-Capillary: 119 mg/dL — ABNORMAL HIGH (ref 70–99)
Glucose-Capillary: 124 mg/dL — ABNORMAL HIGH (ref 70–99)
Glucose-Capillary: 156 mg/dL — ABNORMAL HIGH (ref 70–99)

## 2019-10-04 LAB — PHOSPHORUS
Phosphorus: 4.4 mg/dL (ref 2.5–4.6)
Phosphorus: 4.7 mg/dL — ABNORMAL HIGH (ref 2.5–4.6)

## 2019-10-04 LAB — BASIC METABOLIC PANEL
Anion gap: 12 (ref 5–15)
BUN: 41 mg/dL — ABNORMAL HIGH (ref 8–23)
CO2: 27 mmol/L (ref 22–32)
Calcium: 8.6 mg/dL — ABNORMAL LOW (ref 8.9–10.3)
Chloride: 98 mmol/L (ref 98–111)
Creatinine, Ser: 1.11 mg/dL — ABNORMAL HIGH (ref 0.44–1.00)
GFR calc Af Amer: 53 mL/min — ABNORMAL LOW (ref >60)
GFR calc non Af Amer: 46 mL/min — ABNORMAL LOW (ref >60)
Glucose, Bld: 133 mg/dL — ABNORMAL HIGH (ref 70–99)
Potassium: 5.1 mmol/L (ref 3.5–5.1)
Sodium: 137 mmol/L (ref 135–145)

## 2019-10-04 MED ORDER — MIDAZOLAM HCL 2 MG/2ML IJ SOLN
1.0000 mg | Freq: Once | INTRAMUSCULAR | Status: AC
  Administered 2019-10-04: 1 mg via INTRAVENOUS

## 2019-10-04 MED ORDER — DEXMEDETOMIDINE HCL IN NACL 400 MCG/100ML IV SOLN
0.0000 ug/kg/h | INTRAVENOUS | Status: AC
  Administered 2019-10-04 – 2019-10-05 (×2): 1.2 ug/kg/h via INTRAVENOUS
  Administered 2019-10-05: 0.7 ug/kg/h via INTRAVENOUS
  Administered 2019-10-06: 0.9 ug/kg/h via INTRAVENOUS
  Administered 2019-10-06: 0.6 ug/kg/h via INTRAVENOUS
  Administered 2019-10-07: 0.8 ug/kg/h via INTRAVENOUS
  Filled 2019-10-04 (×6): qty 100

## 2019-10-04 MED ORDER — PREDNISONE 20 MG PO TABS: 40.0000 mg | ORAL_TABLET | Freq: Every day | ORAL | Status: DC

## 2019-10-04 MED ORDER — FENTANYL CITRATE (PF) 100 MCG/2ML IJ SOLN
50.0000 ug | Freq: Once | INTRAMUSCULAR | Status: AC
  Administered 2019-10-04: 50 ug via INTRAVENOUS

## 2019-10-04 MED ORDER — FENTANYL CITRATE (PF) 100 MCG/2ML IJ SOLN
50.0000 ug | INTRAMUSCULAR | Status: DC | PRN
  Administered 2019-10-04 – 2019-10-09 (×3): 50 ug via INTRAVENOUS
  Filled 2019-10-04 (×2): qty 2

## 2019-10-04 MED ORDER — ALBUTEROL SULFATE (2.5 MG/3ML) 0.083% IN NEBU
2.5000 mg | INHALATION_SOLUTION | Freq: Four times a day (QID) | RESPIRATORY_TRACT | Status: DC | PRN
  Administered 2019-10-04: 2.5 mg via RESPIRATORY_TRACT
  Filled 2019-10-04: qty 3

## 2019-10-04 MED ORDER — PRO-STAT SUGAR FREE PO LIQD
30.0000 mL | Freq: Two times a day (BID) | ORAL | Status: DC
  Administered 2019-10-05: 30 mL
  Filled 2019-10-04: qty 30

## 2019-10-04 MED ORDER — ETOMIDATE 2 MG/ML IV SOLN
15.0000 mg | Freq: Once | INTRAVENOUS | Status: AC
  Administered 2019-10-04: 15 mg via INTRAVENOUS

## 2019-10-04 MED ORDER — FENTANYL CITRATE (PF) 100 MCG/2ML IJ SOLN
INTRAMUSCULAR | Status: AC
  Filled 2019-10-04: qty 2

## 2019-10-04 MED ORDER — METHYLPREDNISOLONE SODIUM SUCC 40 MG IJ SOLR
40.0000 mg | Freq: Two times a day (BID) | INTRAMUSCULAR | Status: DC
  Administered 2019-10-04 (×2): 40 mg via INTRAVENOUS
  Filled 2019-10-04 (×2): qty 1

## 2019-10-04 MED ORDER — LACTATED RINGERS IV BOLUS
500.0000 mL | Freq: Once | INTRAVENOUS | Status: AC
  Administered 2019-10-04: 500 mL via INTRAVENOUS

## 2019-10-04 MED ORDER — FENTANYL 2500MCG IN NS 250ML (10MCG/ML) PREMIX INFUSION
0.0000 ug/h | INTRAVENOUS | Status: DC
  Administered 2019-10-04: 25 ug/h via INTRAVENOUS
  Administered 2019-10-06: 50 ug/h via INTRAVENOUS
  Administered 2019-10-07: 200 ug/h via INTRAVENOUS
  Administered 2019-10-08: 175 ug/h via INTRAVENOUS
  Administered 2019-10-09: 125 ug/h via INTRAVENOUS
  Administered 2019-10-09: 200 ug/h via INTRAVENOUS
  Administered 2019-10-10: 300 ug/h via INTRAVENOUS
  Filled 2019-10-04 (×7): qty 250

## 2019-10-04 MED ORDER — SODIUM CHLORIDE 0.9 % IV BOLUS
500.0000 mL | Freq: Once | INTRAVENOUS | Status: AC
  Administered 2019-10-04: 500 mL via INTRAVENOUS

## 2019-10-04 MED ORDER — SODIUM CHLORIDE 3 % IN NEBU
4.0000 mL | INHALATION_SOLUTION | Freq: Two times a day (BID) | RESPIRATORY_TRACT | Status: DC
  Administered 2019-10-04 – 2019-10-05 (×3): 4 mL via RESPIRATORY_TRACT
  Filled 2019-10-04 (×4): qty 4

## 2019-10-04 MED ORDER — MIDAZOLAM HCL 2 MG/2ML IJ SOLN
INTRAMUSCULAR | Status: AC
  Filled 2019-10-04: qty 2

## 2019-10-04 MED ORDER — CARVEDILOL 25 MG PO TABS
25.0000 mg | ORAL_TABLET | Freq: Two times a day (BID) | ORAL | Status: DC
  Administered 2019-10-04: 25 mg

## 2019-10-04 MED ORDER — HYDRALAZINE HCL 25 MG PO TABS
25.0000 mg | ORAL_TABLET | Freq: Three times a day (TID) | ORAL | Status: DC
  Administered 2019-10-04 – 2019-10-08 (×9): 25 mg
  Filled 2019-10-04 (×11): qty 1

## 2019-10-04 MED ORDER — CARVEDILOL 25 MG PO TABS
25.0000 mg | ORAL_TABLET | Freq: Two times a day (BID) | ORAL | Status: DC
  Filled 2019-10-04: qty 1

## 2019-10-04 MED ORDER — PANTOPRAZOLE SODIUM 40 MG PO TBEC: 40.0000 mg | DELAYED_RELEASE_TABLET | Freq: Every day | ORAL | Status: DC

## 2019-10-04 MED ORDER — DEXMEDETOMIDINE HCL IN NACL 400 MCG/100ML IV SOLN
0.4000 ug/kg/h | INTRAVENOUS | Status: DC
  Administered 2019-10-04 (×2): 0.4 ug/kg/h via INTRAVENOUS
  Filled 2019-10-04 (×2): qty 100

## 2019-10-04 MED ORDER — FENTANYL BOLUS VIA INFUSION
25.0000 ug | INTRAVENOUS | Status: DC | PRN
  Administered 2019-10-05 – 2019-10-10 (×7): 25 ug via INTRAVENOUS
  Filled 2019-10-04: qty 25

## 2019-10-04 MED ORDER — ROCURONIUM BROMIDE 50 MG/5ML IV SOLN
50.0000 mg | Freq: Once | INTRAVENOUS | Status: AC
  Administered 2019-10-04: 50 mg via INTRAVENOUS
  Filled 2019-10-04: qty 5

## 2019-10-04 NOTE — Procedures (Signed)
Bronchoscopy  Indication: Abnormal X-ray  Consent: obtained verbally from husband at bedside  Anesthesia: In place for intubation  Procedure - Timeout performed - Bronchoscope advanced through ETT - Airways examined down to subsegmental level - Following airway examination, therapeutic suctioning of lower lobes performed followed by BAL in lingula  Findings - copious mucopurulent secretions suctioned - acute on chronic bronchiitic changes throghout  Specimen(s): BAL lingula  Complications: None immediate

## 2019-10-04 NOTE — Progress Notes (Signed)
NAME:  Melissa Mcdaniel, MRN:  [id number], DOB:  05-22-1933, LOS: 4 ADMISSION DATE:  09/29/2019, CONSULTATION DATE:  09/26/2019 REFERRING MD: Dr. Sabra Heck, CHIEF COMPLAINT: SOB  Brief History   86 y/o F who presented to [facility] on 3/10 with reports of increasing SOB, AMS.  Found to have hypercarbic respiratory failure in the setting of suspected RLL PNA as seen on CXR. Required intubation for respiratory distress.  Pt underwent FOB on 3/8 with cultures growing pan-sensitive pseudomonas.    Past Medical History  HTN AF Chronic Diastolic HF  HTN Chronic Hypoxic Respiratory Failure - new 2L O2 dependent with exertion  CKD  Chron's Disease  Depression   Significant Hospital Events   3/10 Admit   Consults:  PCCM   Procedures:  ETT 3/10 >> 3/14  Significant Diagnostic Tests:   HRCT 09/15/19 >> slight progression in the mid and lower lung zone predominant peribronchovascular nodularity & consolidation with associated bronchiectasis and peribronchial thickening, findings concerning for MAI, separate area of new nodularity in the RUL, no evidence of fibrotic interstitial lung disease, mild air trapping indicative of small airways disease, emphysema, aortic atherosclerosis  CXR 3/10 >> concern for new RLL airspace opacity with small right pleural effusion  Micro Data:  COVID 3/2 >> negative BAL 3/8 >> pseudomonas aeruginosa >> pansensitive  AFB 3/8 >> smear negative >>   COVID 3/10 >> negative  BCx2 3/10 >>   Antimicrobials:  Vanco 3/10  Cefepime 3/10 >> 3/11 Ceftazidime 3/11 >>  Interim history/subjective:  Had to be placed on BIPAP for increased WOB.  BIPAP seemed to help a lot.  Objective   Blood pressure (!) 151/60, pulse 79, temperature 97.6 F (36.4 C), temperature source Axillary, resp. rate 13, weight 47.8 kg, SpO2 100 %.    Vent Mode: BIPAP FiO2 (%):  [40 %-50 %] 50 % Set Rate:  [15 bmp] 15 bmp PEEP:  [5 cmH20] 5 cmH20 Pressure Support:  [13 cmH20] 13 cmH20    Intake/Output Summary (Last 24 hours) at 10/04/2019 0804 Last data filed at 10/04/2019 0600 Gross per 24 hour  Intake 106.45 ml  Output 1060 ml  Net -953.55 ml   Filed Weights   10/02/19 0600 10/03/19 0230 10/04/19 0459  Weight: 43.3 kg 47.2 kg 47.8 kg    Examination: GEN: thin elderly woman on vent HEENT: MM dry, trachea midline CV: RRR, ext warm PULM: transmitted upper airway sounds consistent with retained laryngeal secretions GI: Soft, +BS EXT: Muscle wasting without edema NEURO: Following commands PSYCH: more oriented than yesterday SKIN: No rashes  CBC, BMP stable CBGs okay   Resolved Hospital Problem list    Hypomagnesia   Assessment & Plan:   Pseudomonas Pneumonia  -BAL from 3/8 with pansensitive pseudomonas.  Note RLL, LLL sites of BAL and concern for infection at time of FOB given appearance of secretions. AFB smear negative, await final cultures.   P: -Continue IV Ceftazidine  -Follow AFB  -Pulmonary hygiene  - f/u AFB   Acute Hypercarbic Respiratory Failure secondary to Pseudomonas PNA in setting of Bronchiectasis  P: -Big element of deconditioning/weakness causing inability to clear secretions -Add hypertonic saline -BIPAP PRN, may need NIV for home -Going to start steroids for concurrent bronchospasm, do not love giving with pseudomonas but she is high risk for reintubation if we do not get this under better control -Recheck CXR  Chronic Diastolic CHF  HTN  AF  P: -Continuous telemetry  -Continue home Amiodarone, Coreg, Hydralizine,  -Daily weight  -Strict  intake and output   AKI P: -Follow renal function / urine output -Trend Bmet -Avoid nephrotoxins, ensure adequate renal perfusion   Afib- continue PTA eliquis reduced dosing  Depression  P: -Effexor  -Continue Klonopin at reduced dose   Weight Loss  Moderate Protein Calorie Malnutrition Frail elderly  -Recent dental extractions for implants, possible MAI / infection   P: -Doing okay with pills -Dietitian following  -May need NGT   Best practice:  Diet: TF Pain/Anxiety/Delirium protocol (if indicated): N/A VAP protocol (if indicated): N/A DVT prophylaxis: Eliquis  GI prophylaxis: PPI  Glucose control: n/a  Mobility: BR  Code Status: Full Code.  Family Communication: Will talk with husband when here Disposition: ICU pending more stable respiratory status    The patient is critically ill with multiple organ systems failure and requires high complexity decision making for assessment and support, frequent evaluation and titration of therapies, application of advanced monitoring technologies and extensive interpretation of multiple databases. Critical Care Time devoted to patient care services described in this note independent of APP/resident time (if applicable)  is 36 minutes.   Erskine Emery MD [place] [facility] 10/04/2019 8:04 AM Personal pager: [phone] If unanswered, please page CCM On-call: [phone]

## 2019-10-04 NOTE — Progress Notes (Signed)
eLink Physician-Brief Progress Note Patient Name: Melissa Mcdaniel DOB: 1932/11/05 MRN: [id number]   Date of Service  10/04/2019  HPI/Events of Note  RN requests b/l soft wrist restraints and additional sedation due to agitation and patient biting ETT.   eICU Interventions  Ordered restraints and changed fentanyl to continuous infusion as well as Precedex continuation. Selected this over propofol addition due to BP + UOP concerns previously outlined.      Intervention Category Minor Interventions: Agitation / anxiety - evaluation and management  Charlott Rakes 10/04/2019, 10:02 PM

## 2019-10-04 NOTE — Progress Notes (Signed)
eLink Physician-Brief Progress Note Patient Name: Melissa Mcdaniel DOB: 21-Sep-1932 MRN: [id number]   Date of Service  10/04/2019  HPI/Events of Note  Informed of ABG result as below which is similar to slightly improved from prior. Has bronchiectasis and was intubated today with rate change on ventilator between this gas and last one.  ABG    Component Value Date/Time   PHART 7.286 (L) 10/04/2019 1931   PCO2ART 71.4 (HH) 10/04/2019 1931   PO2ART 117.0 (H) 10/04/2019 1931   HCO3 34.0 (H) 10/04/2019 1931   TCO2 36 (H) 10/04/2019 1931   O2SAT 98.0 10/04/2019 1931    Also, patient has relatively low UOP. Net negative 200cc for today and net negative 1L for y/day. Although BP is normal with SBP in 120s, per chart she is normally hypertensive at baseline so this may be relative hypotension.  No chem panel in past 48 hours.   eICU Interventions  Draw repeat ABG in 3-4 hours to ensure trajectory of improvement on current settings.  Draw CBC and chem panel now and give 500cc LR bolus given oliguria, no recent labs, and possible relative hypotension.      Intervention Category Major Interventions: Acid-Base disturbance - evaluation and management Minor Interventions: Other:  Charlott Rakes 10/04/2019, 8:48 PM

## 2019-10-04 NOTE — Procedures (Signed)
Intubation Procedure Note Melissa Mcdaniel [id number] [birth year]  Procedure: Intubation Indications: Respiratory insufficiency  Procedure Details Consent: Unable to obtain consent because of emergent medical necessity. Time Out: Verified patient identification, verified procedure, site/side was marked, verified correct patient position, special equipment/implants available, medications/allergies/relevent history reviewed, required imaging and test results available.  Performed  Maximum sterile technique was used including antiseptics, cap, gloves, gown and hand hygiene.  MAC and 3  Copious mucopurulent secretions noted in oropharynx abutting vestibule  Evaluation Hemodynamic Status: BP stable throughout; O2 sats: stable throughout Patient's Current Condition: stable Complications: No apparent complications Patient did tolerate procedure well. Chest X-ray ordered to verify placement.  CXR: pending.   Candee Furbish 10/04/2019

## 2019-10-04 NOTE — Progress Notes (Signed)
Worsening mental status and oxygenation through day.  Unable to handle secretions.  Discussed with husband who came to bedside.  Patient reintubated without issue, subsequent bronchoscopy done with husband at bedside to show him how extensive her mucus plugging was.  I guess give a few more days with antibiotics and supportive care.  Her muscle wasting and chronic bronchitis changes make me think this is not a new issue although husband states prior to 4 months ago, she had no breathing issues.  Will have palliative come speak with family in case we cannot wean from vent to help make some hard decisions.  I do not think husband trusts me with this discussion at this time.  Erskine Emery MD PCCM

## 2019-10-04 NOTE — Progress Notes (Signed)
Called to patients room by nursing staff due to increased work of breathing. Placed patient on bipap.

## 2019-10-04 NOTE — Progress Notes (Addendum)
Pt started having increase work of breathing.  MD notified.  PRN neb treatment given along with chest PT.  RT called to assess pt and pt was put on Bipap.  Pt's breathing is improved with Bipap and vital signs are stable.  Medication was given for high BP.  Will continue to closely monitor pt.

## 2019-10-04 NOTE — Plan of Care (Signed)
  Problem: Education: Goal: Knowledge of General Education information will improve Description: Including pain rating scale, medication(s)/side effects and non-pharmacologic comfort measures Outcome: Not Progressing   Problem: Health Behavior/Discharge Planning: Goal: Ability to manage health-related needs will improve Outcome: Not Progressing   Problem: Clinical Measurements: Goal: Ability to maintain clinical measurements within normal limits will improve Outcome: Not Progressing Goal: Will remain free from infection Outcome: Not Progressing Goal: Diagnostic test results will improve Outcome: Not Progressing Goal: Respiratory complications will improve Outcome: Not Progressing Goal: Cardiovascular complication will be avoided Outcome: Not Progressing   Problem: Activity: Goal: Risk for activity intolerance will decrease Outcome: Not Progressing   Problem: Nutrition: Goal: Adequate nutrition will be maintained Outcome: Not Progressing   Problem: Coping: Goal: Level of anxiety will decrease Outcome: Not Progressing   Problem: Elimination: Goal: Will not experience complications related to bowel motility Outcome: Not Progressing Goal: Will not experience complications related to urinary retention Outcome: Not Progressing   Problem: Pain Managment: Goal: General experience of comfort will improve Outcome: Not Progressing   Problem: Safety: Goal: Ability to remain free from injury will improve Outcome: Not Progressing   Problem: Skin Integrity: Goal: Risk for impaired skin integrity will decrease Outcome: Not Progressing   Problem: Activity: Goal: Ability to tolerate increased activity will improve Outcome: Not Progressing   Problem: Clinical Measurements: Goal: Ability to maintain a body temperature in the normal range will improve Outcome: Not Progressing   Problem: Respiratory: Goal: Ability to maintain adequate ventilation will improve Outcome: Not  Progressing Goal: Ability to maintain a clear airway will improve Outcome: Not Progressing   Problem: Activity: Goal: Ability to tolerate increased activity will improve Outcome: Not Progressing   Problem: Respiratory: Goal: Ability to maintain a clear airway and adequate ventilation will improve Outcome: Not Progressing   Problem: Role Relationship: Goal: Method of communication will improve Outcome: Not Progressing

## 2019-10-04 NOTE — Progress Notes (Signed)
Spoke with RN re PICC order.  States has adequate access at this time and ok for PICC placement on Monday.

## 2019-10-04 NOTE — Progress Notes (Signed)
eLink Physician-Brief Progress Note Patient Name: Aliviah Spain DOB: 04-16-1933 MRN: [id number]   Date of Service  10/04/2019  HPI/Events of Note  Wheezing. S/p extubation. Bronchiectasis.  Camera: On nasal o2. VS stable.  eICU Interventions  Albuterol nebs prn stat now. Watch for hypoxemia. Prn ABG.      Intervention Category Intermediate Interventions: Respiratory distress - evaluation and management  Elmer Sow 10/04/2019, 12:16 AM

## 2019-10-05 ENCOUNTER — Other Ambulatory Visit (HOSPITAL_COMMUNITY): Payer: Medicare Other

## 2019-10-05 DIAGNOSIS — Z7189 Other specified counseling: Secondary | ICD-10-CM

## 2019-10-05 DIAGNOSIS — Z515 Encounter for palliative care: Secondary | ICD-10-CM

## 2019-10-05 LAB — GLUCOSE, CAPILLARY
Glucose-Capillary: 120 mg/dL — ABNORMAL HIGH (ref 70–99)
Glucose-Capillary: 121 mg/dL — ABNORMAL HIGH (ref 70–99)
Glucose-Capillary: 121 mg/dL — ABNORMAL HIGH (ref 70–99)
Glucose-Capillary: 125 mg/dL — ABNORMAL HIGH (ref 70–99)
Glucose-Capillary: 134 mg/dL — ABNORMAL HIGH (ref 70–99)
Glucose-Capillary: 138 mg/dL — ABNORMAL HIGH (ref 70–99)
Glucose-Capillary: 152 mg/dL — ABNORMAL HIGH (ref 70–99)

## 2019-10-05 LAB — POCT I-STAT 7, (LYTES, BLD GAS, ICA,H+H)
Acid-Base Excess: 6 mmol/L — ABNORMAL HIGH (ref 0.0–2.0)
Bicarbonate: 33.3 mmol/L — ABNORMAL HIGH (ref 20.0–28.0)
Calcium, Ion: 1.29 mmol/L (ref 1.15–1.40)
HCT: 24 % — ABNORMAL LOW (ref 36.0–46.0)
Hemoglobin: 8.2 g/dL — ABNORMAL LOW (ref 12.0–15.0)
O2 Saturation: 92 %
Potassium: 4.8 mmol/L (ref 3.5–5.1)
Sodium: 137 mmol/L (ref 135–145)
TCO2: 35 mmol/L — ABNORMAL HIGH (ref 22–32)
pCO2 arterial: 65 mmHg — ABNORMAL HIGH (ref 32.0–48.0)
pH, Arterial: 7.317 — ABNORMAL LOW (ref 7.350–7.450)
pO2, Arterial: 73 mmHg — ABNORMAL LOW (ref 83.0–108.0)

## 2019-10-05 LAB — CULTURE, BLOOD (ROUTINE X 2)
Culture: NO GROWTH
Culture: NO GROWTH
Special Requests: ADEQUATE
Special Requests: ADEQUATE

## 2019-10-05 LAB — BODY FLUID CELL COUNT WITH DIFFERENTIAL
Eos, Fluid: 1 %
Lymphs, Fluid: 11 %
Monocyte-Macrophage-Serous Fluid: 18 % — ABNORMAL LOW (ref 50–90)
Neutrophil Count, Fluid: 70 % — ABNORMAL HIGH (ref 0–25)
Total Nucleated Cell Count, Fluid: 945 cu mm (ref 0–1000)

## 2019-10-05 LAB — PHOSPHORUS
Phosphorus: 4.1 mg/dL (ref 2.5–4.6)
Phosphorus: 4 mg/dL (ref 2.5–4.6)

## 2019-10-05 LAB — TRIGLYCERIDES: Triglycerides: 57 mg/dL (ref <150)

## 2019-10-05 LAB — MAGNESIUM
Magnesium: 1.9 mg/dL (ref 1.7–2.4)
Magnesium: 2 mg/dL (ref 1.7–2.4)

## 2019-10-05 MED ORDER — MIDAZOLAM HCL 2 MG/2ML IJ SOLN
INTRAMUSCULAR | Status: AC
  Filled 2019-10-05: qty 2

## 2019-10-05 MED ORDER — VITAL AF 1.2 CAL PO LIQD
1000.0000 mL | ORAL | Status: DC
  Administered 2019-10-06 – 2019-10-10 (×5): 1000 mL

## 2019-10-05 MED ORDER — SODIUM CHLORIDE 0.9 % IV SOLN
1.0000 g | INTRAVENOUS | Status: AC
  Administered 2019-10-05 – 2019-10-09 (×5): 1 g via INTRAVENOUS
  Filled 2019-10-05 (×5): qty 1

## 2019-10-05 MED ORDER — MIDAZOLAM HCL 2 MG/2ML IJ SOLN
2.0000 mg | Freq: Once | INTRAMUSCULAR | Status: AC
  Administered 2019-10-05: 2 mg via INTRAVENOUS

## 2019-10-05 MED ORDER — PANTOPRAZOLE SODIUM 40 MG PO PACK
40.0000 mg | PACK | ORAL | Status: DC
  Administered 2019-10-05 – 2019-10-10 (×6): 40 mg
  Filled 2019-10-05 (×6): qty 20

## 2019-10-05 MED ORDER — CLONAZEPAM 0.25 MG PO TBDP
0.2500 mg | ORAL_TABLET | Freq: Two times a day (BID) | ORAL | Status: DC
  Administered 2019-10-05 (×2): 0.25 mg
  Filled 2019-10-05 (×2): qty 1

## 2019-10-05 MED ORDER — ACETAMINOPHEN 325 MG PO TABS: 650.0000 mg | ORAL_TABLET | ORAL | Status: DC | PRN

## 2019-10-05 MED ORDER — LATANOPROST 0.005 % OP SOLN
1.0000 [drp] | Freq: Every day | OPHTHALMIC | Status: DC
  Administered 2019-10-05 – 2019-10-09 (×5): 1 [drp] via OPHTHALMIC
  Filled 2019-10-05: qty 2.5

## 2019-10-05 MED ORDER — VITAL HIGH PROTEIN PO LIQD: 1000.0000 mL | ORAL | Status: AC

## 2019-10-05 MED ORDER — SODIUM CHLORIDE 0.9 % IV SOLN: INTRAVENOUS | Status: DC

## 2019-10-05 MED ORDER — METHYLPREDNISOLONE SODIUM SUCC 40 MG IJ SOLR
20.0000 mg | Freq: Two times a day (BID) | INTRAMUSCULAR | Status: AC
  Administered 2019-10-05 – 2019-10-06 (×4): 20 mg via INTRAVENOUS
  Filled 2019-10-05 (×4): qty 1

## 2019-10-05 MED ORDER — VITAL HIGH PROTEIN PO LIQD: 1000.0000 mL | ORAL | Status: DC

## 2019-10-05 NOTE — Consult Note (Signed)
Consultation Note Date: 10/05/2019   Patient Name: Melissa Mcdaniel  DOB: Jul 08, 1933  MRN: [id number]  Age / Sex: 86 y.o., female  PCP: Harlan Stains, MD Referring Physician: Chesley Mires, MD  Reason for Consultation: Establishing goals of care  HPI/Patient Profile: 86 y.o. female  with past medical history of emphysema, bronchiectasis, HTN, diastolic CHF, atrial fibrillation, CKD stage 3, Crohn's disease admitted on 10/07/2019 with worsening shortness of breath s/p bronchoscopy 09/28/19 growing Pseudomonas in RLL pneumonia requiring intubation. Extubated 3/13 but required re-intubation and repeat bronchoscopy 3/14.   Clinical Assessment and Goals of Care: I have reviewed electronic records back into 2019 which appears to be when she had pneumonia and subsequent diagnosis of emphysema and right lung scarring.   I met with her husband, Delfino Lovett, at bedside and we were joined by Dr. Halford Chessman. I explained to [name] that palliative care is a support to patients and families dealing with serious illness and difficult decisions. He acknowledges that this is certainly the case for them. He shares that his wife has been declining over past couple months and especially past few weeks. More short of breath, weaker, weight loss - she is very thin and frail appearing. Dr. Halford Chessman gave insight into progressing emphysema that has likely led to this pneumonia and now concern that she is very weak and potentially may not be able to extubate successfully in the future. [name] asks about time limits for endotracheal tube and explained this can only be weeks and tracheostomy was introduced and I explained that if this would be considered this would require much more conversation about expectations and quality of life. [name] does agree that quality of life is important. Explained to Delfino Lovett that this will be our conversations is hoping for the  best but also planning for if that doesn't happen over the next few days but this does not have to be all figured out today.   [name] has no further questions/concerns. He has a lot of information to process. He received a phone call from one of their sons so I left him to return call and will speak further with him tomorrow. I provided my contact information to him in case he has any questions/concerns in the meantime. Emotional support provided. I will follow up tomorrow.     Primary Decision Maker NEXT OF KIN husband    SUMMARY OF RECOMMENDATIONS   Ongoing [place] discussions  Code Status/Advance Care Planning:  Full code - will need further discussion   Symptom Management:   Per PCCM  Palliative Prophylaxis:   Aspiration, Bowel Regimen, Frequent Pain Assessment, Oral Care and Turn Reposition  Prognosis:   Overall prognosis guarded with severe underlying lung disease with pneumonia and overall weakness and functional decline x weeks to months.   Discharge Planning: To Be Determined      Primary Diagnoses: Present on Admission: . Acute respiratory failure with hypoxia and hypercarbia ([place])   I have reviewed the medical record, interviewed the patient and family, and examined the patient. The following aspects  are pertinent.  Past Medical History:  Diagnosis Date  . A-fib ([place])   . Chronic kidney disease   . Congestive heart failure (CHF) ([place]) 05/2016  . Crohn's disease ([place])   . Dysrhythmia    afib dx approx. 2016 per pt  . Hip fracture ([place])   . Hypertension    Social History   Socioeconomic History  . Marital status: Married    Spouse name: Not on file  . Number of children: 3  . Years of education: Not on file  . Highest education level: Not on file  Occupational History  . Occupation: Worked at [employer]  . Smoking status: Former Smoker    Packs/day: 0.50    Years: 30.00    Pack years: 15.00    Types: Cigarettes    Start date:  47    Quit date: 1980    Years since quitting: 41.2  . Smokeless tobacco: Never Used  Substance and Sexual Activity  . Alcohol use: No  . Drug use: No  . Sexual activity: Not on file  Other Topics Concern  . Not on file  Social History Narrative  . Not on file   Social Determinants of Health   Financial Resource Strain:   . Difficulty of Paying Living Expenses:   Food Insecurity:   . Worried About Charity fundraiser in the Last Year:   . Arboriculturist in the Last Year:   Transportation Needs:   . Film/video editor (Medical):   Marland Kitchen Lack of Transportation (Non-Medical):   Physical Activity:   . Days of Exercise per Week:   . Minutes of Exercise per Session:   Stress:   . Feeling of Stress :   Social Connections:   . Frequency of Communication with Friends and Family:   . Frequency of Social Gatherings with Friends and Family:   . Attends Religious Services:   . Active Member of Clubs or Organizations:   . Attends Archivist Meetings:   Marland Kitchen Marital Status:    Family History  Problem Relation Age of Onset  . Lung cancer Mother   . Alzheimer's disease Father   . Diabetes Maternal Grandmother    Scheduled Meds: . amiodarone  200 mg Per Tube Daily  . apixaban  2.5 mg Per Tube BID  . arformoterol  15 mcg Nebulization BID  . budesonide (PULMICORT) nebulizer solution  0.5 mg Nebulization BID  . chlorhexidine gluconate (MEDLINE KIT)  15 mL Mouth Rinse BID  . Chlorhexidine Gluconate Cloth  6 each Topical Daily  . clonazepam  0.25 mg Per Tube BID  . feeding supplement (PRO-STAT SUGAR FREE 64)  30 mL Per Tube BID  . feeding supplement (VITAL HIGH PROTEIN)  1,000 mL Per Tube Q24H  . Gerhardt's butt cream   Topical TID  . hydrALAZINE  25 mg Per Tube Q8H  . insulin aspart  0-9 Units Subcutaneous Q4H  . latanoprost  1 drop Both Eyes QHS  . mouth rinse  15 mL Mouth Rinse 10 times per day  . mouth rinse  15 mL Mouth Rinse q12n4p  . methylPREDNISolone  (SOLU-MEDROL) injection  20 mg Intravenous Q12H  . pantoprazole sodium  40 mg Per Tube Q24H  . venlafaxine  75 mg Per Tube BID   Continuous Infusions: . cefTAZidime (FORTAZ)  IV    . dexmedetomidine (PRECEDEX) IV infusion 0.7 mcg/kg/hr (10/05/19 1000)  . fentaNYL infusion INTRAVENOUS 100 mcg/hr (10/05/19 1000)  PRN Meds:.acetaminophen, albuterol, bisacodyl, docusate, fentaNYL, fentaNYL (SUBLIMAZE) injection, labetalol, ondansetron (ZOFRAN) IV Allergies  Allergen Reactions  . Penicillins Swelling    Did it involve swelling of the face/tongue/throat, SOB, or low BP? Yes Did it involve sudden or severe rash/hives, skin peeling, or any reaction on the inside of your mouth or nose? No Did you need to seek medical attention at a hospital or doctor's office? No When did it last happen?childhood allergy If all above answers are "NO", may proceed with cephalosporin use.   . Levothyroxine Nausea And Vomiting   Review of Systems  Unable to perform ROS: Intubated    Physical Exam Vitals and nursing note reviewed.  Constitutional:      General: She is not in acute distress.    Appearance: She is ill-appearing.     Interventions: She is intubated.     Comments: Thin, frail   Cardiovascular:     Rate and Rhythm: Bradycardia present.     Comments: Secondary to Precedex Pulmonary:     Effort: No tachypnea, accessory muscle usage or respiratory distress. She is intubated.  Abdominal:     Palpations: Abdomen is soft.  Neurological:     Comments: Sedated, does not follow commands     Vital Signs: BP (!) 92/44   Pulse (!) 56   Temp 97.9 F (36.6 C) (Oral)   Resp 14   Wt 47.8 kg   SpO2 97%   BMI 23.63 kg/m  Pain Scale: CPOT POSS *See Group Information*: S-Acceptable,Sleep, easy to arouse Pain Score: 0-No pain   SpO2: SpO2: 97 % O2 Device:SpO2: 97 % O2 Flow Rate: .O2 Flow Rate (L/min): 3 L/min  IO: Intake/output summary:   Intake/Output Summary (Last 24 hours) at  10/05/2019 1041 Last data filed at 10/05/2019 1000 Gross per 24 hour  Intake 1502.23 ml  Output 675 ml  Net 827.23 ml    LBM: Last BM Date: (rectal foley) Baseline Weight: Weight: 42.2 kg Most recent weight: Weight: 47.8 kg     Palliative Assessment/Data:     Time In: 1030 Time Out: 1130 Time Total: 60 min Greater than 50%  of this time was spent counseling and coordinating care related to the above assessment and plan.  Signed by: Vinie Sill, NP Palliative Medicine Team Pager # [phone] (M-F 8a-5p) Team Phone # [phone] (Nights/Weekends)

## 2019-10-05 NOTE — Progress Notes (Signed)
PHARMACY NOTE:  ANTIMICROBIAL RENAL DOSAGE ADJUSTMENT  Current antimicrobial regimen includes a mismatch between antimicrobial dosage and estimated renal function.  As per policy approved by the Pharmacy & Therapeutics and Medical Executive Committees, the antimicrobial dosage will be adjusted accordingly.  Current antimicrobial dosage:  Ceftazidime 1 gram IV every 12 hours  Indication: pseudomonas pneumonia  Renal Function: Creatinine today has trended up since yesterday (1.03> 1.11), estimated CrCl is ~24 ml/min. Note patient weighs 47.8 kg.   Estimated Creatinine Clearance: 24.4 mL/min (A) (by C-G formula based on SCr of 1.11 mg/dL (H)).  Antimicrobial dosage has been changed to:  Ceftazidime 1 gram IV every 24 hours. Next dose tonight at 2200.  Additional comments: Per Dr. Halford Chessman, likely length of therapy is 10-14 days. Today is day 6.    Thank you for allowing pharmacy to be a part of this patient's care.  Eddie Candle, PharmD PGY-1 Pharmacy Resident   Please check amion for clinical pharmacist contact number

## 2019-10-05 NOTE — Procedures (Signed)
Cortrak  Person Inserting Tube:  Jacklynn Barnacle E, RD Tube Type:  Cortrak - 43 inches Tube Location:  Left nare Initial Placement:  Stomach Secured by: Bridle Technique Used to Measure Tube Placement:  Documented cm marking at nare/ corner of mouth Cortrak Secured At:  64 cm    Cortrak Tube Team Note:  Consult received to place a Cortrak feeding tube.   No x-ray is required. RN may begin using tube.   If the tube becomes dislodged please keep the tube and contact the Cortrak team at [email or web address] ([email or web address]) for replacement.  If after hours and replacement cannot be delayed, place a NG tube and confirm placement with an abdominal x-ray.    Jacklynn Barnacle, MS, RD, LDN Pager number available on Amion

## 2019-10-05 NOTE — Progress Notes (Signed)
Nutrition Follow up  DOCUMENTATION CODES:   Severe malnutrition in context of chronic illness  INTERVENTION:   Finish liter bottle of Vital High Protein @ 40 ml/hr   Transition to:  Vital AF 1.2 @ 45 ml/hr via Cortrak (1080 ml)  Provides: 1296 kcals, 81 grams protein, 876 ml free water.   NUTRITION DIAGNOSIS:   Severe Malnutrition related to chronic illness(CHF) as evidenced by severe fat depletion, severe muscle depletion, energy intake < or equal to 75% for > or equal to 1 month.  GOAL:   Patient will meet greater than or equal to 90% of their needs  MONITOR:   Weight trends, Labs, I & O's, Diet advancement, Vent status, Skin, TF tolerance  REASON FOR ASSESSMENT:   Consult Enteral/tube feeding initiation and management  ASSESSMENT:   Patient with PMH significant for HTN, CHF, AF, CKD, Chron's disease s/p bowel resection, and depression. Presents this admission with acute respiratory failure 2/2 pseudomonas PNA.   3/13- extubation  3/14- copious secretions, failed BiPAP, re-intubated   Pt discussed during ICU rounds and with RN.   Palliative to speak with husband. Vital High Protein @ 40 ml/hr restarted after Cortrak placed this am. Plan transition to more calorie dense formula now that pt is no longer requiring propofol.   Admission weight: 42.2 kg  Current weight: 47.8 kg   Patient is currently intubated on ventilator support MV: 5.5 L/min Temp (24hrs), Avg:98 F (36.7 C), Min:97.7 F (36.5 C), Max:98.6 F (37 C)   I/O: +2,465 ml since admit UOP: 790 ml x 24 hrs   Drips: precedex Medications: SS novolog, solumedrol Labs: CBG 119-138  Diet Order:   Diet Order            Diet NPO time specified  Diet effective now              EDUCATION NEEDS:   Not appropriate for education at this time  Skin:  Skin Assessment: Skin Integrity Issues: Skin Integrity Issues:: Other (Comment) Other: Skin tear- L/R arm and excoriation sacrum/perineum  Last  BM:  3/15  Height:   Ht Readings from Last 1 Encounters:  09/28/19 4' 8"  (1.422 m)    Weight:   Wt Readings from Last 1 Encounters:  10/04/19 47.8 kg    BMI:  Body mass index is 23.63 kg/m.  Estimated Nutritional Needs:   Kcal:  1075-1290 kcal  Protein:  65-80 grams  Fluid:  >/= 1 L/day   Mariana Single RD, LDN Clinical Nutrition Pager listed in [place]

## 2019-10-05 NOTE — Progress Notes (Signed)
NAME:  Melissa Mcdaniel, MRN:  [id number], DOB:  1933/06/24, LOS: 5 ADMISSION DATE:  09/21/2019, CONSULTATION DATE:  10/11/2019 REFERRING MD: Dr. Sabra Heck, CHIEF COMPLAINT: SOB  Brief History   86 yo female former smoker presented with dyspnea and altered mental status.  Found to have acute on chronic hypercapnia (on 2 liters oxygen as outpt) and Rt lower lobe pneumonia from Pseudomonas.  Past Medical History  HTN, Persistent A fib, Diastolic CHF, HTN, CKD, Crohn's disease, Depression, COPD with emphysema, bronchiectasis  Significant Hospital Events   3/08 Bronchoscopy 3/10 present to ER 3/13 Extubated 3/14 increased WOB, trial of Bipap unsuccessful >> reintubated, bronchoscopy  Consults:  Palliative care  Procedures:  ETT 3/10 >> 3/13 ETT 3/13 >.  Significant Diagnostic Tests:  PFT 09/01/19 >> FEV1 0.67 (59%), FEV1% 60, TLC 5.35 (136%), DLCO 41% HRCT chest 09/16/19 >> centrilobular emphysema, atherosclerosis, calcified granulomas, apical scarring, mild/lower zone predominant BTX with nodularity, air trapping  Micro Data:  SARS CoV2 3/02 >> negative BAL 3/08 >> Pseudomonas BAL AFB 3/08 >> BAL Fungal 3/08 >>  SARS CoV2 3/10 >> negative Blood 3/10 >>  BAL 3/14 >>   Antimicrobials:  Vanco 3/10  Cefepime 3/10 >> 3/11 Ceftazidime 3/11 >>  Interim history/subjective:  On vent, sedation.  Objective   Blood pressure (!) 100/48, pulse 66, temperature 97.9 F (36.6 C), temperature source Oral, resp. rate 14, weight 47.8 kg, SpO2 100 %.    Vent Mode: PRVC FiO2 (%):  [40 %-100 %] 40 % Set Rate:  [15 bmp-22 bmp] 22 bmp Vt Set:  [300 mL] 300 mL PEEP:  [2 cmH20-5 cmH20] 5 cmH20 Plateau Pressure:  [9 cmH20-19 cmH20] 19 cmH20   Intake/Output Summary (Last 24 hours) at 10/05/2019 0819 Last data filed at 10/05/2019 0700 Gross per 24 hour  Intake 1435.6 ml  Output 715 ml  Net 720.6 ml   Filed Weights   10/02/19 0600 10/03/19 0230 10/04/19 0459  Weight: 43.3 kg 47.2 kg 47.8 kg     Examination:  General - sedated Eyes - pupils reactive ENT - ETT in place Cardiac - irregular Chest - b/l crackles Rt > Lt, no wheeze Abdomen - soft, non tender, + bowel sounds Extremities - decreased muscle bulk Skin - no rashes Neuro - RASS -1    Resolved Hospital Problem list   Hypomagnesia   Assessment & Plan:   Acute on chronic hypoxic/hypercapnic respiratory failure from Pseudomonal pneumonia in setting of severe COPD/emphysema and bronchiectasis. - failed extubation trial 3/13 - change to pressure control ventilation 3/15 - f/u CXR - change solumedrol to 20 mg bid and wean off as tolerated - scheduled BDs - day 6 of Abx, currently on fortaz  Permanent A fib, chronic diastolic CHF, HTN. Bradycardia from precedex. - continue amiodarone, eliquis, hydralazine - hold coreg for now  Acute metabolic encephalopathy from hypercapnia, pneumonia. Hx of depression. - RASS goal 0 to -1 - wean off klonopin - continue effexor  Anemia of critical illness, iron deficiency, and chronic disease. - f/u CBC - transfuse for Hb < 7 or bleeding - repeat iron levels  CKD 3a. - f/u BMET  Severe protein calorie malnutrition. - tube feeds  Steroid induced hyperglycemia. - SSI   Best practice:  Diet: TF DVT prophylaxis: Eliquis  GI prophylaxis: protonix Mobility: BR  Code Status: Full Code  Disposition: ICU  Labs:   CMP Latest Ref Rng & Units 10/05/2019 10/04/2019 10/04/2019  Glucose 70 - 99 mg/dL - 133(H) -  BUN 8 -  23 mg/dL - 41(H) -  Creatinine 0.44 - 1.00 mg/dL - 1.11(H) -  Sodium 135 - 145 mmol/L 137 137 137  Potassium 3.5 - 5.1 mmol/L 4.8 5.1 4.8  Chloride 98 - 111 mmol/L - 98 -  CO2 22 - 32 mmol/L - 27 -  Calcium 8.9 - 10.3 mg/dL - 8.6(L) -  Total Protein 6.5 - 8.1 g/dL - - -  Total Bilirubin 0.3 - 1.2 mg/dL - - -  Alkaline Phos 38 - 126 U/L - - -  AST 15 - 41 U/L - - -  ALT 0 - 44 U/L - - -    CBC Latest Ref Rng & Units 10/05/2019 10/04/2019 10/04/2019   WBC 4.0 - 10.5 K/uL - 5.7 -  Hemoglobin 12.0 - 15.0 g/dL 8.2(L) 8.1(L) 7.8(L)  Hematocrit 36.0 - 46.0 % 24.0(L) 28.1(L) 23.0(L)  Platelets 150 - 400 K/uL - 231 -    ABG    Component Value Date/Time   PHART 7.317 (L) 10/05/2019 0025   PCO2ART 65.0 (H) 10/05/2019 0025   PO2ART 73.0 (L) 10/05/2019 0025   HCO3 33.3 (H) 10/05/2019 0025   TCO2 35 (H) 10/05/2019 0025   O2SAT 92.0 10/05/2019 0025    CBG (last 3)  Recent Labs    10/05/19 0017 10/05/19 0345 10/05/19 0807  GLUCAP 120* 138* 125*    CC time 35 minutes  Chesley Mires, MD [facility] 10/05/2019, 8:54 AM

## 2019-10-06 ENCOUNTER — Inpatient Hospital Stay (HOSPITAL_COMMUNITY): Payer: Medicare Other

## 2019-10-06 LAB — CBC
HCT: 28.2 % — ABNORMAL LOW (ref 36.0–46.0)
Hemoglobin: 7.8 g/dL — ABNORMAL LOW (ref 12.0–15.0)
MCH: 26.1 pg (ref 26.0–34.0)
MCHC: 27.7 g/dL — ABNORMAL LOW (ref 30.0–36.0)
MCV: 94.3 fL (ref 80.0–100.0)
Platelets: 253 10*3/uL (ref 150–400)
RBC: 2.99 MIL/uL — ABNORMAL LOW (ref 3.87–5.11)
RDW: 13.9 % (ref 11.5–15.5)
WBC: 5.7 10*3/uL (ref 4.0–10.5)
nRBC: 0 % (ref 0.0–0.2)

## 2019-10-06 LAB — BASIC METABOLIC PANEL
Anion gap: 8 (ref 5–15)
BUN: 62 mg/dL — ABNORMAL HIGH (ref 8–23)
CO2: 31 mmol/L (ref 22–32)
Calcium: 8.7 mg/dL — ABNORMAL LOW (ref 8.9–10.3)
Chloride: 101 mmol/L (ref 98–111)
Creatinine, Ser: 1.37 mg/dL — ABNORMAL HIGH (ref 0.44–1.00)
GFR calc Af Amer: 41 mL/min — ABNORMAL LOW (ref >60)
GFR calc non Af Amer: 35 mL/min — ABNORMAL LOW (ref >60)
Glucose, Bld: 179 mg/dL — ABNORMAL HIGH (ref 70–99)
Potassium: 5 mmol/L (ref 3.5–5.1)
Sodium: 140 mmol/L (ref 135–145)

## 2019-10-06 LAB — IRON AND TIBC
Iron: 14 ug/dL — ABNORMAL LOW (ref 28–170)
Saturation Ratios: 4 % — ABNORMAL LOW (ref 10.4–31.8)
TIBC: 360 ug/dL (ref 250–450)
UIBC: 346 ug/dL

## 2019-10-06 LAB — CULTURE, RESPIRATORY W GRAM STAIN: Culture: NO GROWTH

## 2019-10-06 LAB — FERRITIN: Ferritin: 55 ng/mL (ref 11–307)

## 2019-10-06 LAB — GLUCOSE, CAPILLARY
Glucose-Capillary: 151 mg/dL — ABNORMAL HIGH (ref 70–99)
Glucose-Capillary: 146 mg/dL — ABNORMAL HIGH (ref 70–99)
Glucose-Capillary: 158 mg/dL — ABNORMAL HIGH (ref 70–99)
Glucose-Capillary: 153 mg/dL — ABNORMAL HIGH (ref 70–99)
Glucose-Capillary: 149 mg/dL — ABNORMAL HIGH (ref 70–99)

## 2019-10-06 MED ORDER — CLONAZEPAM 0.25 MG PO TBDP
0.2500 mg | ORAL_TABLET | Freq: Every day | ORAL | Status: DC
  Administered 2019-10-06 – 2019-10-09 (×4): 0.25 mg
  Filled 2019-10-06 (×4): qty 1

## 2019-10-06 MED ORDER — PREDNISONE 5 MG/5ML PO SOLN
20.0000 mg | Freq: Every day | ORAL | Status: DC
  Administered 2019-10-07 – 2019-10-09 (×3): 20 mg
  Filled 2019-10-06 (×3): qty 20

## 2019-10-06 MED ORDER — SODIUM CHLORIDE 0.9 % IV SOLN
250.0000 mg | Freq: Once | INTRAVENOUS | Status: AC
  Administered 2019-10-06: 250 mg via INTRAVENOUS
  Filled 2019-10-06: qty 20

## 2019-10-06 NOTE — Progress Notes (Signed)
Palliative:  HPI: 86 y.o. female  with past medical history of emphysema, bronchiectasis, HTN, diastolic CHF, atrial fibrillation, CKD stage 3, Crohn's disease admitted on 09/23/2019 with worsening shortness of breath s/p bronchoscopy 09/28/19 growing Pseudomonas in RLL pneumonia requiring intubation. Extubated 3/13 but required re-intubation and repeat bronchoscopy 3/14.   I met today at Ms. Quincy's bedside. She was able to wean briefly but then had increased work of breathing. I spoke with husband, Delfino Lovett, at bedside. He is still very hopeful for improvement and successful extubation. I explained that I am also hopeful and we are supporting her and giving her every chance to successfully wean. My concern is her recent decline and weakness and ability to compensate and clear secretions with underlying severe lung disease. I fear that her weakness is going to be a huge barrier to ultimate improvement from this acute illness. I discussed further with Delfino Lovett today that they will be asked if she is able to be extubated if she were to decline as she did before if we should re-intubate and place her back on the ventilator. If we support her with aggressive interventions and she still does not thrive then her chances of improving to an acceptable quality of life is very poor. I encouraged [name] to continue thinking and speaking with their children about these decisions and trying to plan ahead. He is torn about making sure that she is provided all chance to improve but he also understands she is very ill and quality of life is very important. We also reviewed chest xrays.   All questions/concerns addressed. Emotional support provided.   Exam: Arouses intermittently. No distress. Tolerating vent with breathing regular, unlabored. Abd soft. Generalized weakness. Thin, frail.   Plan: - Ongoing [place] conversations. Discussed potential of liberalized visitation to bring daughter into conversation as well.   Roy, NP Palliative Medicine Team Pager [phone] (Please see [email or web address] for schedule) Team Phone [phone]    Greater than 50%  of this time was spent counseling and coordinating care related to the above assessment and plan

## 2019-10-06 NOTE — Progress Notes (Signed)
NAME:  Melissa Mcdaniel, MRN:  [id number], DOB:  11-Feb-1933, LOS: 6 ADMISSION DATE:  10/18/2019, CONSULTATION DATE:  10/03/2019 REFERRING MD: Dr. Sabra Heck, CHIEF COMPLAINT: SOB  Brief History   86 yo female former smoker presented with dyspnea and altered mental status.  Found to have acute on chronic hypercapnia (on 2 liters oxygen as outpt) and Rt lower lobe pneumonia from Pseudomonas.  Past Medical History  HTN, Persistent A fib, Diastolic CHF, HTN, CKD, Crohn's disease, Depression, COPD with emphysema, bronchiectasis  Significant Hospital Events   3/08 Bronchoscopy 3/10 present to ER 3/13 Extubated 3/14 increased WOB, trial of Bipap unsuccessful >> reintubated, bronchoscopy  Consults:  Palliative care  Procedures:  ETT 3/10 >> 3/13 ETT 3/13 >>  Significant Diagnostic Tests:  PFT 09/01/19 >> FEV1 0.67 (59%), FEV1% 60, TLC 5.35 (136%), DLCO 41% HRCT chest 09/16/19 >> centrilobular emphysema, atherosclerosis, calcified granulomas, apical scarring, mild/lower zone predominant BTX with nodularity, air trapping  Micro Data:  SARS CoV2 3/02 >> negative BAL 3/08 >> Pseudomonas BAL AFB 3/08 >> BAL Fungal 3/08 >>  SARS CoV2 3/10 >> negative Blood 3/10 >> negative BAL 3/14 >> negative  Antimicrobials:  Vanco 3/10  Cefepime 3/10 >> 3/11 Ceftazidime 3/11 >>  Interim history/subjective:  On vent, sedation.  Objective   Blood pressure (!) 116/48, pulse 63, temperature 97.9 F (36.6 C), temperature source Oral, resp. rate 16, weight 47.8 kg, SpO2 100 %.    Vent Mode: PCV FiO2 (%):  [40 %] 40 % Set Rate:  [16 bmp] 16 bmp PEEP:  [5 cmH20] 5 cmH20 Plateau Pressure:  [16 cmH20-19 cmH20] 17 cmH20   Intake/Output Summary (Last 24 hours) at 10/06/2019 0814 Last data filed at 10/06/2019 0600 Gross per 24 hour  Intake 1418.59 ml  Output 607 ml  Net 811.59 ml   Filed Weights   10/02/19 0600 10/03/19 0230 10/04/19 0459  Weight: 43.3 kg 47.2 kg 47.8 kg    Examination:  General -  sedated Eyes - pupils reactive ENT - ETT in place Cardiac - irregular Chest - decreased BS, scattered rhonchi, no wheeze Abdomen - soft, non tender, + bowel sounds Extremities - decreased muscle bulk Skin - no rashes Neuro - agitated with [facility] Problem list   Hypomagnesia   Assessment & Plan:   Acute on chronic hypoxic/hypercapnic respiratory failure from Pseudomonal pneumonia in setting of severe COPD/emphysema and bronchiectasis. - failed extubation trial 3/13 - changed to pressure control as rest mode 3/15 - pressure support wean as able >> not ready for repeat extubation trial yet - f/u CXR - plan to change to 20 mg prednisone on 3/17, and wean off as able - scheduled BDs - day 7/10 of ABx, currently on fortaz  Permanent A fib, chronic diastolic CHF, HTN. Bradycardia from precedex. - continue amiodarone, eliquis, hydralazine - hold outpt coreg  Acute metabolic encephalopathy from hypercapnia, pneumonia. Hx of depression. - RASS goal 0 to -1 - change klonopin to 0.25 mg qhs - continue effexor  Anemia of critical illness, iron deficiency, and chronic disease. - f/u CBC - transfuse for Hb < 7 or bleeding - ferric gluconate IV on 3/16  CKD 3a. - f/u BMET  Severe protein calorie malnutrition. - tube feeds  Steroid induced hyperglycemia. - SSI  Goals of care. - updated pt's husband 3/15; he is hopeful she will be able to recover - asked husband to consider what plan of care should be as we move beyond her acute illness -  palliative care consulted   Best practice:  Diet: TF DVT prophylaxis: Eliquis  GI prophylaxis: protonix Mobility: BR  Code Status: Full Code  Disposition: ICU  Labs:   CMP Latest Ref Rng & Units 10/06/2019 10/05/2019 10/04/2019  Glucose 70 - 99 mg/dL 179(H) - 133(H)  BUN 8 - 23 mg/dL 62(H) - 41(H)  Creatinine 0.44 - 1.00 mg/dL 1.37(H) - 1.11(H)  Sodium 135 - 145 mmol/L 140 137 137  Potassium 3.5 - 5.1 mmol/L 5.0  4.8 5.1  Chloride 98 - 111 mmol/L 101 - 98  CO2 22 - 32 mmol/L 31 - 27  Calcium 8.9 - 10.3 mg/dL 8.7(L) - 8.6(L)  Total Protein 6.5 - 8.1 g/dL - - -  Total Bilirubin 0.3 - 1.2 mg/dL - - -  Alkaline Phos 38 - 126 U/L - - -  AST 15 - 41 U/L - - -  ALT 0 - 44 U/L - - -    CBC Latest Ref Rng & Units 10/06/2019 10/05/2019 10/04/2019  WBC 4.0 - 10.5 K/uL 5.7 - 5.7  Hemoglobin 12.0 - 15.0 g/dL 7.8(L) 8.2(L) 8.1(L)  Hematocrit 36.0 - 46.0 % 28.2(L) 24.0(L) 28.1(L)  Platelets 150 - 400 K/uL 253 - 231    ABG    Component Value Date/Time   PHART 7.317 (L) 10/05/2019 0025   PCO2ART 65.0 (H) 10/05/2019 0025   PO2ART 73.0 (L) 10/05/2019 0025   HCO3 33.3 (H) 10/05/2019 0025   TCO2 35 (H) 10/05/2019 0025   O2SAT 92.0 10/05/2019 0025    CBG (last 3)  Recent Labs    10/05/19 2354 10/06/19 0429 10/06/19 0801  GLUCAP 134* 151* 146*    CC time 34 minutes  Chesley Mires, MD [facility] Pulmonary/Critical Care 10/06/2019, 8:14 AM

## 2019-10-06 NOTE — Progress Notes (Signed)
eLink Physician-Brief Progress Note Patient Name: Melissa Mcdaniel DOB: Oct 07, 1932 MRN: [id number]   Date of Service  10/06/2019  HPI/Events of Note  Pt with copious diarrhea and early signs of peri-anal skin breakdown.  eICU Interventions  Flexiseal ordered.        Kerry Kass Sabastian Raimondi 10/06/2019, 12:21 AM

## 2019-10-07 ENCOUNTER — Inpatient Hospital Stay (HOSPITAL_COMMUNITY): Payer: Medicare Other

## 2019-10-07 LAB — CBC
HCT: 26.1 % — ABNORMAL LOW (ref 36.0–46.0)
Hemoglobin: 7.4 g/dL — ABNORMAL LOW (ref 12.0–15.0)
MCH: 26.8 pg (ref 26.0–34.0)
MCHC: 28.4 g/dL — ABNORMAL LOW (ref 30.0–36.0)
MCV: 94.6 fL (ref 80.0–100.0)
Platelets: 242 10*3/uL (ref 150–400)
RBC: 2.76 MIL/uL — ABNORMAL LOW (ref 3.87–5.11)
RDW: 14 % (ref 11.5–15.5)
WBC: 7.2 10*3/uL (ref 4.0–10.5)
nRBC: 0 % (ref 0.0–0.2)

## 2019-10-07 LAB — GLUCOSE, CAPILLARY
Glucose-Capillary: 117 mg/dL — ABNORMAL HIGH (ref 70–99)
Glucose-Capillary: 124 mg/dL — ABNORMAL HIGH (ref 70–99)
Glucose-Capillary: 135 mg/dL — ABNORMAL HIGH (ref 70–99)
Glucose-Capillary: 142 mg/dL — ABNORMAL HIGH (ref 70–99)
Glucose-Capillary: 147 mg/dL — ABNORMAL HIGH (ref 70–99)
Glucose-Capillary: 152 mg/dL — ABNORMAL HIGH (ref 70–99)

## 2019-10-07 LAB — BASIC METABOLIC PANEL
Anion gap: 6 (ref 5–15)
BUN: 66 mg/dL — ABNORMAL HIGH (ref 8–23)
CO2: 31 mmol/L (ref 22–32)
Calcium: 8.7 mg/dL — ABNORMAL LOW (ref 8.9–10.3)
Chloride: 105 mmol/L (ref 98–111)
Creatinine, Ser: 1.38 mg/dL — ABNORMAL HIGH (ref 0.44–1.00)
GFR calc Af Amer: 41 mL/min — ABNORMAL LOW (ref >60)
GFR calc non Af Amer: 35 mL/min — ABNORMAL LOW (ref >60)
Glucose, Bld: 151 mg/dL — ABNORMAL HIGH (ref 70–99)
Potassium: 5 mmol/L (ref 3.5–5.1)
Sodium: 142 mmol/L (ref 135–145)

## 2019-10-07 MED ORDER — FUROSEMIDE 10 MG/ML IJ SOLN
20.0000 mg | Freq: Once | INTRAMUSCULAR | Status: AC
  Administered 2019-10-07: 20 mg via INTRAVENOUS
  Filled 2019-10-07: qty 2

## 2019-10-07 NOTE — Progress Notes (Signed)
Updated husband at bedside. Updated son Melissa Mcdaniel via phone.

## 2019-10-07 NOTE — Progress Notes (Signed)
Palliative:  I came by to visit but no family at bedside. I will try again tomorrow.   No charge  Vinie Sill, NP Palliative Medicine Team Pager [phone] (Please see [email or web address] for schedule) Team Phone [phone]

## 2019-10-07 NOTE — Progress Notes (Signed)
NAME:  Melissa Mcdaniel, MRN:  [id number], DOB:  Jun 30, 1933, LOS: 7 ADMISSION DATE:  10/03/2019, CONSULTATION DATE:  10/17/2019 REFERRING MD: Dr. Sabra Heck, CHIEF COMPLAINT: SOB  Brief History   86 yo female former smoker presented with dyspnea and altered mental status.  Found to have acute on chronic hypercapnia (on 2 liters oxygen as outpt) and Rt lower lobe pneumonia from Pseudomonas.  Past Medical History  HTN, Persistent A fib, Diastolic CHF, HTN, CKD, Crohn's disease, Depression, COPD with emphysema, bronchiectasis  Significant Hospital Events   3/08 Bronchoscopy 3/10 present to ER 3/13 Extubated 3/14 increased WOB, trial of Bipap unsuccessful >> reintubated, bronchoscopy  Consults:  Palliative care  Procedures:  ETT 3/10 >> 3/13 ETT 3/13 >>  Significant Diagnostic Tests:  PFT 09/01/19 >> FEV1 0.67 (59%), FEV1% 60, TLC 5.35 (136%), DLCO 41% HRCT chest 09/16/19 >> centrilobular emphysema, atherosclerosis, calcified granulomas, apical scarring, mild/lower zone predominant BTX with nodularity, air trapping  Micro Data:  SARS CoV2 3/02 >> negative BAL 3/08 >> Pseudomonas BAL AFB 3/08 >> BAL Fungal 3/08 >>  SARS CoV2 3/10 >> negative Blood 3/10 >> negative BAL 3/14 >> negative  Antimicrobials:  Vanco 3/10  Cefepime 3/10 >> 3/11 Ceftazidime 3/11 >>  Interim history/subjective:  3/17: awake and following commands.  3/16: On vent, sedation.  Objective   Blood pressure (!) 139/49, pulse 83, temperature 97.8 F (36.6 C), temperature source Oral, resp. rate 16, weight 49.5 kg, SpO2 98 %.    Vent Mode: PCV FiO2 (%):  [40 %] 40 % Set Rate:  [16 bmp] 16 bmp PEEP:  [5 cmH20] 5 cmH20 Pressure Support:  [10 cmH20] 10 cmH20 Plateau Pressure:  [13 cmH20-16 cmH20] 16 cmH20   Intake/Output Summary (Last 24 hours) at 10/07/2019 0752 Last data filed at 10/07/2019 0600 Gross per 24 hour  Intake 2105.08 ml  Output 1165 ml  Net 940.08 ml   Filed Weights   10/03/19 0230 10/04/19  0459 10/07/19 0345  Weight: 47.2 kg 47.8 kg 49.5 kg    Examination:  General - awake on ett saying "no" when asked to perform simple tasks Eyes - pupils reactive, eomi ENT - ETT in place, mm dry but pink Cardiac - irregular Chest - decreased BS, no wheeze Abdomen - soft, non tender, + bowel sounds Extremities - decreased muscle bulk Skin - no rashes Neuro - awake and follows some commands    Resolved Hospital Problem list   Hypomagnesia   Assessment & Plan:   Acute on chronic hypoxic/hypercapnic respiratory failure from Pseudomonal pneumonia in setting of severe COPD/emphysema and bronchiectasis. - failed extubation trial 3/13 - changed to pressure control as rest mode 3/15 - pressure support wean as able -failed this am, will retry later today.  -will warrant conversation with family about potential one way.  - f/u CXR with pulm edema personally reviewed (will give dose lasix today) -20 mg prednisone on 3/17, and wean off as able - scheduled BDs - day 8/10 of ABx, currently on fortaz  Permanent A fib, chronic diastolic CHF, HTN. Bradycardia from precedex. - continue amiodarone, eliquis, hydralazine - hold outpt coreg  Acute metabolic encephalopathy from hypercapnia, pneumonia. Hx of depression. - RASS goal 0 to -1 - change klonopin to 0.25 mg qhs - continue effexor  Anemia of critical illness, iron deficiency, and chronic disease. - f/u CBC - transfuse for Hb < 7 or bleeding - ferric gluconate IV on 3/16  CKD 3a. - indices relatively stable around 1.3 -uop adequate  Severe protein calorie malnutrition. - tube feeds  Steroid induced hyperglycemia. - SSI  Goals of care. - updated pt's husband 3/17 pending - asked husband to consider what plan of care should be as we move beyond her acute illness - palliative care input appreciated   Best practice:  Diet: TF DVT prophylaxis: Eliquis  GI prophylaxis: protonix Mobility: BR  Code Status: Full Code    Disposition: ICU  Labs:   CMP Latest Ref Rng & Units 10/07/2019 10/06/2019 10/05/2019  Glucose 70 - 99 mg/dL 151(H) 179(H) -  BUN 8 - 23 mg/dL 66(H) 62(H) -  Creatinine 0.44 - 1.00 mg/dL 1.38(H) 1.37(H) -  Sodium 135 - 145 mmol/L 142 140 137  Potassium 3.5 - 5.1 mmol/L 5.0 5.0 4.8  Chloride 98 - 111 mmol/L 105 101 -  CO2 22 - 32 mmol/L 31 31 -  Calcium 8.9 - 10.3 mg/dL 8.7(L) 8.7(L) -  Total Protein 6.5 - 8.1 g/dL - - -  Total Bilirubin 0.3 - 1.2 mg/dL - - -  Alkaline Phos 38 - 126 U/L - - -  AST 15 - 41 U/L - - -  ALT 0 - 44 U/L - - -    CBC Latest Ref Rng & Units 10/07/2019 10/06/2019 10/05/2019  WBC 4.0 - 10.5 K/uL 7.2 5.7 -  Hemoglobin 12.0 - 15.0 g/dL 7.4(L) 7.8(L) 8.2(L)  Hematocrit 36.0 - 46.0 % 26.1(L) 28.2(L) 24.0(L)  Platelets 150 - 400 K/uL 242 253 -    ABG    Component Value Date/Time   PHART 7.317 (L) 10/05/2019 0025   PCO2ART 65.0 (H) 10/05/2019 0025   PO2ART 73.0 (L) 10/05/2019 0025   HCO3 33.3 (H) 10/05/2019 0025   TCO2 35 (H) 10/05/2019 0025   O2SAT 92.0 10/05/2019 0025    CBG (last 3)  Recent Labs    10/06/19 2007 10/07/19 0031 10/07/19 0437  GLUCAP 149* 142* 152*   Critical care time: The patient is critically ill with multiple organ systems failure and requires high complexity decision making for assessment and support, frequent evaluation and titration of therapies, application of advanced monitoring technologies and extensive interpretation of multiple databases.  Critical care time 35 mins. This represents my time independent of the NPs time taking care of the pt. This is excluding procedures.    Audria Nine DO [facility] 10/07/2019, 7:59 AM

## 2019-10-08 ENCOUNTER — Inpatient Hospital Stay (HOSPITAL_COMMUNITY): Payer: Medicare Other

## 2019-10-08 LAB — CBC
HCT: 31 % — ABNORMAL LOW (ref 36.0–46.0)
Hemoglobin: 8.6 g/dL — ABNORMAL LOW (ref 12.0–15.0)
MCH: 26.6 pg (ref 26.0–34.0)
MCHC: 27.7 g/dL — ABNORMAL LOW (ref 30.0–36.0)
MCV: 96 fL (ref 80.0–100.0)
Platelets: 221 10*3/uL (ref 150–400)
RBC: 3.23 MIL/uL — ABNORMAL LOW (ref 3.87–5.11)
RDW: 14.3 % (ref 11.5–15.5)
WBC: 8.1 10*3/uL (ref 4.0–10.5)
nRBC: 0 % (ref 0.0–0.2)

## 2019-10-08 LAB — BASIC METABOLIC PANEL
Anion gap: 8 (ref 5–15)
BUN: 69 mg/dL — ABNORMAL HIGH (ref 8–23)
CO2: 35 mmol/L — ABNORMAL HIGH (ref 22–32)
Calcium: 8.5 mg/dL — ABNORMAL LOW (ref 8.9–10.3)
Chloride: 102 mmol/L (ref 98–111)
Creatinine, Ser: 1.47 mg/dL — ABNORMAL HIGH (ref 0.44–1.00)
GFR calc Af Amer: 38 mL/min — ABNORMAL LOW (ref >60)
GFR calc non Af Amer: 32 mL/min — ABNORMAL LOW (ref >60)
Glucose, Bld: 115 mg/dL — ABNORMAL HIGH (ref 70–99)
Potassium: 5.5 mmol/L — ABNORMAL HIGH (ref 3.5–5.1)
Sodium: 145 mmol/L (ref 135–145)

## 2019-10-08 LAB — GLUCOSE, CAPILLARY
Glucose-Capillary: 108 mg/dL — ABNORMAL HIGH (ref 70–99)
Glucose-Capillary: 137 mg/dL — ABNORMAL HIGH (ref 70–99)
Glucose-Capillary: 157 mg/dL — ABNORMAL HIGH (ref 70–99)
Glucose-Capillary: 170 mg/dL — ABNORMAL HIGH (ref 70–99)
Glucose-Capillary: 59 mg/dL — ABNORMAL LOW (ref 70–99)
Glucose-Capillary: 69 mg/dL — ABNORMAL LOW (ref 70–99)
Glucose-Capillary: 83 mg/dL (ref 70–99)
Glucose-Capillary: 89 mg/dL (ref 70–99)

## 2019-10-08 LAB — POTASSIUM: Potassium: 4.8 mmol/L (ref 3.5–5.1)

## 2019-10-08 MED ORDER — DEXMEDETOMIDINE HCL IN NACL 400 MCG/100ML IV SOLN
0.4000 ug/kg/h | INTRAVENOUS | Status: DC
  Administered 2019-10-08: 0.4 ug/kg/h via INTRAVENOUS
  Administered 2019-10-08: 1.2 ug/kg/h via INTRAVENOUS
  Administered 2019-10-09: 0.8 ug/kg/h via INTRAVENOUS
  Administered 2019-10-09 – 2019-10-10 (×2): 1.2 ug/kg/h via INTRAVENOUS
  Administered 2019-10-10: 1 ug/kg/h via INTRAVENOUS
  Filled 2019-10-08 (×7): qty 100

## 2019-10-08 MED ORDER — SODIUM ZIRCONIUM CYCLOSILICATE 10 G PO PACK
10.0000 g | PACK | Freq: Once | ORAL | Status: AC
  Administered 2019-10-08: 10 g via ORAL
  Filled 2019-10-08: qty 1

## 2019-10-08 MED ORDER — DEXTROSE 50 % IV SOLN
12.5000 g | INTRAVENOUS | Status: AC
  Administered 2019-10-08: 12.5 g via INTRAVENOUS

## 2019-10-08 MED ORDER — DEXTROSE 50 % IV SOLN
INTRAVENOUS | Status: AC
  Filled 2019-10-08: qty 50

## 2019-10-08 MED ORDER — DEXTROSE 50 % IV SOLN
12.5000 g | INTRAVENOUS | Status: AC
  Administered 2019-10-08: 12.5 g via INTRAVENOUS
  Filled 2019-10-08: qty 50

## 2019-10-08 NOTE — Plan of Care (Signed)

## 2019-10-08 NOTE — Progress Notes (Signed)
Updated husband at bedside. 3 hr sbt but increased wob at end and mental status remains agitated and not following commands today.   Family has not made decision as of yet on plan once extubated. Sons from out of town to arrive today. Have d/w charge RN to allow one time exception for family to gather to discuss wishes.

## 2019-10-08 NOTE — Progress Notes (Addendum)
NAME:  Melissa Mcdaniel, MRN:  [id number], DOB:  September 08, 1932, LOS: 8 ADMISSION DATE:  10/18/2019, CONSULTATION DATE:  10/16/2019 REFERRING MD: Dr. Sabra Heck, CHIEF COMPLAINT: SOB  Brief History   86 yo female former smoker presented with dyspnea and altered mental status.  Found to have acute on chronic hypercapnia (on 2 liters oxygen as outpt) and Rt lower lobe pneumonia from Pseudomonas.  Past Medical History  HTN, Persistent A fib, Diastolic CHF, HTN, CKD, Crohn's disease, Depression, COPD with emphysema, bronchiectasis  Significant Hospital Events   3/08 Bronchoscopy 3/10 present to ER 3/13 Extubated 3/14 increased WOB, trial of Bipap unsuccessful >> reintubated, bronchoscopy  Consults:  Palliative care  Procedures:  ETT 3/10 >> 3/13 ETT 3/13 >>  Significant Diagnostic Tests:  PFT 09/01/19 >> FEV1 0.67 (59%), FEV1% 60, TLC 5.35 (136%), DLCO 41% HRCT chest 09/16/19 >> centrilobular emphysema, atherosclerosis, calcified granulomas, apical scarring, mild/lower zone predominant BTX with nodularity, air trapping  Micro Data:  SARS CoV2 3/02 >> negative BAL 3/08 >> Pseudomonas BAL AFB 3/08 >> BAL Fungal 3/08 >>  SARS CoV2 3/10 >> negative Blood 3/10 >> negative BAL 3/14 >> negative  Antimicrobials:  Vanco 3/10  Cefepime 3/10 >> 3/11 Ceftazidime 3/11 >>  Interim history/subjective:  3/18: hyperkalemic this am. Afebrile and on minimal vent settings. cxr stable. Pt awake but not following commands.   3/17: awake and following commands.  3/16: On vent, sedation.  Objective   Blood pressure 119/61, pulse 83, temperature 97.7 F (36.5 C), temperature source Oral, resp. rate 18, weight 48.4 kg, SpO2 96 %.    Vent Mode: PCV FiO2 (%):  [40 %] 40 % Set Rate:  [16 bmp] 16 bmp PEEP:  [5 cmH20] 5 cmH20 Plateau Pressure:  [11 cmH20-17 cmH20] 11 cmH20   Intake/Output Summary (Last 24 hours) at 10/08/2019 0747 Last data filed at 10/08/2019 0700 Gross per 24 hour  Intake 2279.9 ml    Output 2005 ml  Net 274.9 ml   Filed Weights   10/04/19 0459 10/07/19 0345 10/08/19 0500  Weight: 47.8 kg 49.5 kg 48.4 kg    Examination:  General - awake on vent, on cpap actually. Not following commands this am.  Eyes - pupils reactive, eomi ENT - ETT in place, mm dry but pink Cardiac - irregular Chest - decreased BS, no wheeze Abdomen - soft, non tender, + bowel sounds Extremities - decreased muscle bulk, frail appearing Skin - no rashes Neuro - awake but not following commands this am. Moves all 4 equally however.     Resolved Hospital Problem list   Hypomagnesia   Assessment & Plan:   Acute on chronic hypoxic/hypercapnic respiratory failure from Pseudomonal pneumonia in setting of severe COPD/emphysema and bronchiectasis. - failed extubation trial 3/13 - changed to pressure control as rest mode 3/15 - pressure support wean as able: tolerating this am.  - ongoing discussions with family re: one way extubation vs need for reintubation and trach.  - f/u CXR with pulm edema and stable infiltrates (renal function declining) -20 mg prednisone on 3/17, and wean off as able - scheduled BDs - day 9/10 of ABx, currently on fortaz  Permanent A fib, chronic diastolic CHF, HTN. Bradycardia from precedex: improved - continue amiodarone, eliquis -hold hydralazine today with marginal pressures.  - hold outpt coreg  Acute metabolic encephalopathy from hypercapnia, pneumonia. Hx of depression. - RASS goal 0 to -1 -klonopin to 0.25 mg qhs - continue effexor  Anemia of critical illness, iron deficiency, and chronic  disease. - f/u CBC - transfuse for Hb < 7 or bleeding - ferric gluconate IV on 3/16  CKD 3a. Hyperkalemia - indices slight worsening 1.3->1.4 -K up to 5.5 will dose lokelma -recheck K at 1400 -uop adequate  Severe protein calorie malnutrition. - tube feeds  Steroid induced hyperglycemia. - SSI  Goals of care. - updated pt's husband 3/18 pending -  asked husband and pt's son to consider what plan of care should be as we move beyond her acute illness - palliative care input appreciated   Best practice:  Diet: TF DVT prophylaxis: Eliquis  GI prophylaxis: protonix Mobility: BR  Code Status: Full Code  Disposition: ICU  Labs:   CMP Latest Ref Rng & Units 10/08/2019 10/07/2019 10/06/2019  Glucose 70 - 99 mg/dL 115(H) 151(H) 179(H)  BUN 8 - 23 mg/dL 69(H) 66(H) 62(H)  Creatinine 0.44 - 1.00 mg/dL 1.47(H) 1.38(H) 1.37(H)  Sodium 135 - 145 mmol/L 145 142 140  Potassium 3.5 - 5.1 mmol/L 5.5(H) 5.0 5.0  Chloride 98 - 111 mmol/L 102 105 101  CO2 22 - 32 mmol/L 35(H) 31 31  Calcium 8.9 - 10.3 mg/dL 8.5(L) 8.7(L) 8.7(L)  Total Protein 6.5 - 8.1 g/dL - - -  Total Bilirubin 0.3 - 1.2 mg/dL - - -  Alkaline Phos 38 - 126 U/L - - -  AST 15 - 41 U/L - - -  ALT 0 - 44 U/L - - -    CBC Latest Ref Rng & Units 10/08/2019 10/07/2019 10/06/2019  WBC 4.0 - 10.5 K/uL 8.1 7.2 5.7  Hemoglobin 12.0 - 15.0 g/dL 8.6(L) 7.4(L) 7.8(L)  Hematocrit 36.0 - 46.0 % 31.0(L) 26.1(L) 28.2(L)  Platelets 150 - 400 K/uL 221 242 253    ABG    Component Value Date/Time   PHART 7.317 (L) 10/05/2019 0025   PCO2ART 65.0 (H) 10/05/2019 0025   PO2ART 73.0 (L) 10/05/2019 0025   HCO3 33.3 (H) 10/05/2019 0025   TCO2 35 (H) 10/05/2019 0025   O2SAT 92.0 10/05/2019 0025    CBG (last 3)  Recent Labs    10/08/19 0401 10/08/19 0442 10/08/19 0547  GLUCAP 59* 69* 89   Critical care time: The patient is critically ill with multiple organ systems failure and requires high complexity decision making for assessment and support, frequent evaluation and titration of therapies, application of advanced monitoring technologies and extensive interpretation of multiple databases.  Critical care time 39 mins. This represents my time independent of the NPs time taking care of the pt. This is excluding procedures.    [facility] 10/08/2019, 7:47 AM

## 2019-10-08 NOTE — Progress Notes (Signed)
Palliative:  HPI: 86 y.o.femalewith past medical history of emphysema, bronchiectasis, HTN, diastolic CHF, atrial fibrillation, CKD stage 3, Crohn's diseaseadmitted on 03/28/2021with worsening shortness of breath s/p bronchoscopy 09/28/19 growing Pseudomonas in RLL pneumonia requiring intubation.Extubated 3/13 but required re-intubation and repeat bronchoscopy 3/14.    I met today at Melissa Mcdaniel's bedside. Received update from RN that she did wean for a couple hours but then became agitated with increased work of breathing. Husband, Melissa Mcdaniel, is at bedside today. We talked about his 2 sons that are en-route from [place] to come visit. We discussed meeting tomorrow to further discuss plans when it comes time to extubate. Melissa Mcdaniel does mention that he wants to ensure that his sons are able to visit with her "just in case." I agree that this will be good and also to support him through difficult decisions. He is still hopeful for improvement but does recognize that she has had significant decline and is very weak.   All questions/concerns addressed. Emotional support provided.   Exam: Sedated on vent. No distress. Breathing regular, unlabored on vent. Abd soft. Generalized weakness.   Plan: - I will ask my colleague to help arrange meeting with husband and sons tomorrow to further discuss GOC.   20 min   , NP Palliative Medicine Team Pager [phone] (Please see [email or web address] for schedule) Team Phone [phone]    Greater than 50%  of this time was spent counseling and coordinating care related to the above assessment and plan  

## 2019-10-09 DIAGNOSIS — Z66 Do not resuscitate: Secondary | ICD-10-CM

## 2019-10-09 DIAGNOSIS — Z7189 Other specified counseling: Secondary | ICD-10-CM

## 2019-10-09 DIAGNOSIS — J9600 Acute respiratory failure, unspecified whether with hypoxia or hypercapnia: Secondary | ICD-10-CM

## 2019-10-09 LAB — BASIC METABOLIC PANEL
Anion gap: 8 (ref 5–15)
BUN: 78 mg/dL — ABNORMAL HIGH (ref 8–23)
CO2: 30 mmol/L (ref 22–32)
Calcium: 8.4 mg/dL — ABNORMAL LOW (ref 8.9–10.3)
Chloride: 106 mmol/L (ref 98–111)
Creatinine, Ser: 1.56 mg/dL — ABNORMAL HIGH (ref 0.44–1.00)
GFR calc Af Amer: 35 mL/min — ABNORMAL LOW (ref >60)
GFR calc non Af Amer: 30 mL/min — ABNORMAL LOW (ref >60)
Glucose, Bld: 117 mg/dL — ABNORMAL HIGH (ref 70–99)
Potassium: 4.9 mmol/L (ref 3.5–5.1)
Sodium: 144 mmol/L (ref 135–145)

## 2019-10-09 LAB — CBC
HCT: 25.2 % — ABNORMAL LOW (ref 36.0–46.0)
Hemoglobin: 7 g/dL — ABNORMAL LOW (ref 12.0–15.0)
MCH: 26.8 pg (ref 26.0–34.0)
MCHC: 27.8 g/dL — ABNORMAL LOW (ref 30.0–36.0)
MCV: 96.6 fL (ref 80.0–100.0)
Platelets: 178 10*3/uL (ref 150–400)
RBC: 2.61 MIL/uL — ABNORMAL LOW (ref 3.87–5.11)
RDW: 14.6 % (ref 11.5–15.5)
WBC: 7.2 10*3/uL (ref 4.0–10.5)
nRBC: 0 % (ref 0.0–0.2)

## 2019-10-09 LAB — GLUCOSE, CAPILLARY
Glucose-Capillary: 108 mg/dL — ABNORMAL HIGH (ref 70–99)
Glucose-Capillary: 115 mg/dL — ABNORMAL HIGH (ref 70–99)
Glucose-Capillary: 122 mg/dL — ABNORMAL HIGH (ref 70–99)
Glucose-Capillary: 154 mg/dL — ABNORMAL HIGH (ref 70–99)
Glucose-Capillary: 174 mg/dL — ABNORMAL HIGH (ref 70–99)
Glucose-Capillary: 182 mg/dL — ABNORMAL HIGH (ref 70–99)

## 2019-10-09 LAB — HEMOGLOBIN AND HEMATOCRIT, BLOOD
HCT: 25.9 % — ABNORMAL LOW (ref 36.0–46.0)
Hemoglobin: 7.1 g/dL — ABNORMAL LOW (ref 12.0–15.0)

## 2019-10-09 LAB — PREPARE RBC (CROSSMATCH)

## 2019-10-09 MED ORDER — PREDNISONE 5 MG/5ML PO SOLN
10.0000 mg | Freq: Every day | ORAL | Status: DC
  Administered 2019-10-10: 10 mg
  Filled 2019-10-09: qty 10

## 2019-10-09 MED ORDER — SODIUM CHLORIDE 0.9% IV SOLUTION: Freq: Once | INTRAVENOUS | Status: DC

## 2019-10-09 MED ORDER — MIDAZOLAM HCL 2 MG/2ML IJ SOLN
1.0000 mg | INTRAMUSCULAR | Status: DC | PRN
  Administered 2019-10-09 – 2019-10-10 (×3): 1 mg via INTRAVENOUS
  Filled 2019-10-09 (×3): qty 2

## 2019-10-09 MED ORDER — CHLORHEXIDINE GLUCONATE 0.12 % MT SOLN
OROMUCOSAL | Status: AC
  Filled 2019-10-09: qty 15

## 2019-10-09 NOTE — Progress Notes (Signed)
MD stated to hold on 1 unit of RBC's at this time.

## 2019-10-09 NOTE — Plan of Care (Signed)
  Problem: Health Behavior/Discharge Planning: Goal: Ability to manage health-related needs will improve Outcome: Not Progressing   Problem: Education: Goal: Knowledge of General Education information will improve Description: Including pain rating scale, medication(s)/side effects and non-pharmacologic comfort measures Outcome: Not Progressing   Problem: Clinical Measurements: Goal: Ability to maintain clinical measurements within normal limits will improve Outcome: Not Progressing Goal: Will remain free from infection Outcome: Not Progressing Goal: Diagnostic test results will improve Outcome: Not Progressing Goal: Respiratory complications will improve Outcome: Not Progressing Goal: Cardiovascular complication will be avoided Outcome: Not Progressing

## 2019-10-09 NOTE — Progress Notes (Addendum)
Palliative Medicine Inpatient Follow Up Note  HPI: 86 y.o.femalewith past medical history of emphysema, bronchiectasis, HTN, diastolic CHF, atrial fibrillation, CKD stage 3, Crohn's diseaseadmitted on 3/10/2021with worsening shortness of breath s/p bronchoscopy 09/28/19 growing Pseudomonas in RLL pneumonia requiring intubation.Extubated 3/13 but required re-intubation and repeat bronchoscopy 3/14.  Palliative care was asked on 3/15 to get involved for establishing goals of care.   Today's Discussion (10/09/2019): Chart reviewed. I met with Minette Brine, bedside RN. She shares that Melissa Mcdaniel was not able to sustain weaning yesterday. She has remained incrementally agitated thought to be r/t ICU associate delirium.   Angelee assessed. She appears to track though is not able to squeeze hand when questions are asked. When her sons, Delfino Lovett and Shanon Brow came to bedside she appears more anxious and is clenching her fists.   I spoke with Arletha Grippe (husband), Haynes Bast. (son), and Merion Caton (son). We discussed Ryian's current health state. I shared with them the reality that Chaunte was unable to be off of ventilatory support for very long. We talked about tracheostomy and gastrostomy tube placement which the family had all agreed was not aligned with her goals. Discussed cardiopulmonary resuscitation which her family also agreed would leave her in a terrible state and would not be desired for [facility].   [name] (husband) expressed how difficult this is for him. He shared that they have almost been married for 42 five years. He got rather tearful describing how he would never want Melissa Mcdaniel to be in pain or suffering. I allowed him time to express his feelings and offered therapeutic listening as a form of emotional support. I shared with he and his sons what comfort may look like in the hospital setting inclusive of medications such as opioids, benzodiazepines, and  anticholinergics to control symptoms.   [name] had asked if another breathing trial could be done to see how she may today tolerate it. I told him that I would be happy to speak with nursing about this. We agreed to [facility] tomorrow though I shared if she decompensates in the meanwhile I would at that point not recommend being aggressive and allowing [place] to transition our focus from curative to comfort oriented. They are all in agreement of this.  It is apparent that Delfino Lovett needs more time to process his wife's failing health state. We will continue to offer support throughout this difficult time.   Discussed with patient the importance of continued conversation with family and their  medical providers regarding overall plan of care and treatment options, ensuring decisions are within the context of the patients values and GOCs.  Provided "Hard Choices for [employer]" booklet.   Provided "Gone From My Site" booklet.  Vital Signs Vitals:   10/09/19 0855 10/09/19 0900  BP:    Pulse:    Resp:    Temp:  98.5 F (36.9 C)  SpO2: 100%     Intake/Output Summary (Last 24 hours) at 10/09/2019 1017 Last data filed at 10/09/2019 0700 Gross per 24 hour  Intake 1483.03 ml  Output 880 ml  Net 603.03 ml   Last Weight  Most recent update: 10/09/2019  5:04 AM   Weight  48.2 kg (106 lb 4.2 oz)           Gen: Chronically ill appearing cachectic elderly female HEENT: dry moist mucous membranes,coretrack in place, intubation tube in place CV: Regular rate and rhythm, no murmurs rubs or gallops PULM: Ventilated, (+) rhonchi ABD: soft/nontender  EXT: No edema  Neuro: Disoriented  Recommendations and Plan: DNR  Weaning Trial  Sunday morning meeting at [place] to discuss patients progress vs. Palliative extubation  Chaplain support  Discussed with Dr. Ruthann Cancer  Time In: 10:30 Time Out: 11:25 Time Spent: 55 Greater than 50% of the time was spent in counseling and coordination of  care ______________________________________________________________________________________ [facility] Team Team Cell Phone: [phone] Please utilize secure chat with additional questions, if there is no response within 30 minutes please call the above phone number  Palliative Medicine Team providers are available by phone from 7am to 7pm daily and can be reached through the team cell phone.  Should this patient require assistance outside of these hours, please call the patient's attending physician.  Addendum: Patient failed morning weaning trial. Again met with Arletha Grippe (husband), Haynes Bast. (son), and Rayna Brenner (son). We discussed how Melissa Mcdaniel had again done poorly. I shared that her body might be indicating to [place] that it is tired and unable to complete the task we are asking it to. [name] said that he would prefer if we give her another day of rest and then attempt another breathing trial. He expressed that this will give him the time he needs to process things. We agreed to reconvene on Sunday at 10AM to determine which direction we will go.   Time Spent: 25 Greater than 50% of the time was spent in counseling and coordination of care

## 2019-10-09 NOTE — Progress Notes (Signed)
NAME:  Melissa Mcdaniel, MRN:  [id number], DOB:  04/16/1933, LOS: 9 ADMISSION DATE:  10/08/2019, CONSULTATION DATE:  10/04/2019 REFERRING MD: Dr. Sabra Heck, CHIEF COMPLAINT: SOB  Brief History   86 yo female former smoker presented with dyspnea and altered mental status.  Found to have acute on chronic hypercapnia (on 2 liters oxygen as outpt) and Rt lower lobe pneumonia from Pseudomonas.  Past Medical History  HTN, Persistent A fib, Diastolic CHF, HTN, CKD, Crohn's disease, Depression, COPD with emphysema, bronchiectasis  Significant Hospital Events   3/08 Bronchoscopy 3/10 present to ER 3/13 Extubated 3/14 increased WOB, trial of Bipap unsuccessful >> reintubated, bronchoscopy  Consults:  Palliative care  Procedures:  ETT 3/10 >> 3/13 ETT 3/13 >>  Significant Diagnostic Tests:  PFT 09/01/19 >> FEV1 0.67 (59%), FEV1% 60, TLC 5.35 (136%), DLCO 41% HRCT chest 09/16/19 >> centrilobular emphysema, atherosclerosis, calcified granulomas, apical scarring, mild/lower zone predominant BTX with nodularity, air trapping  Micro Data:  SARS CoV2 3/02 >> negative BAL 3/08 >> Pseudomonas BAL AFB 3/08 >> BAL Fungal 3/08 >>  SARS CoV2 3/10 >> negative Blood 3/10 >> negative BAL 3/14 >> negative  Antimicrobials:  Vanco 3/10  Cefepime 3/10 >> 3/11 Ceftazidime 3/11 >>  Interim history/subjective:  3/19: doing ok, no reported events overnight. Concerning is her uop decrease to <1L over past 24 hours and continued rise in indices. Concern BUN will soon play a role in poor mental status prohibiting extubation on top of compromised renal status. Do not feel with chronic comorbidities and age that pt is a good dialysis candidate but may need renal consult as we cont with ongoing d/w family (hopefully today as two out of town sons have arrived to [facility], pt has local daughter as well). hgb also down to 7 without overt bleeding, will check stat H&H and go from there. C/o pain this am but unable to  qualify 3/18: hyperkalemic this am. Afebrile and on minimal vent settings. cxr stable. Pt awake but not following commands.   3/17: awake and following commands.  3/16: On vent, sedation.  Objective   Blood pressure (!) 100/45, pulse 63, temperature 98.6 F (37 C), temperature source Oral, resp. rate 18, weight 48.2 kg, SpO2 97 %.    Vent Mode: PCV FiO2 (%):  [40 %] 40 % Set Rate:  [16 bmp] 16 bmp PEEP:  [5 cmH20] 5 cmH20 Pressure Support:  [12 cmH20] 12 cmH20 Plateau Pressure:  [18 cmH20-19 cmH20] 18 cmH20   Intake/Output Summary (Last 24 hours) at 10/09/2019 0749 Last data filed at 10/09/2019 0700 Gross per 24 hour  Intake 1680.84 ml  Output 925 ml  Net 755.84 ml   Filed Weights   10/07/19 0345 10/08/19 0500 10/09/19 0501  Weight: 49.5 kg 48.4 kg 48.2 kg    Examination:  General - awake on vent, following commands this am but intermittently   Eyes - pupils reactive, eomi ENT - ETT in place, mm dry but pink, edentulous  Cardiac - irregular Chest - decreased BS, no wheeze Abdomen - soft, non tender, + bowel sounds Extremities - decreased muscle bulk, frail appearing Skin - no rashes Neuro - awake and intermittently following commands this am. Moves all 4 equally however.     Resolved Hospital Problem list   Hypomagnesia   Assessment & Plan:   Acute on chronic hypoxic/hypercapnic respiratory failure from Pseudomonal pneumonia in setting of severe COPD/emphysema and bronchiectasis. - failed extubation trial 3/13 - pressure support wean as able:tolerated 3 hours yesterday  but increased wob towards end and req placement back on full support - ongoing discussions with family re: one way extubation vs need for reintubation and trach. Hopeful for palliative meeting today with family - f/u CXR tomorrow -wean to 10 mg prednisone today and wean off as able  - scheduled BDs - day 10/10 of ABx, currently on fortaz  Permanent A fib, chronic diastolic CHF, HTN. Bradycardia  from precedex: improved - continue amiodarone, eliquis -cont holding hydralazine today with marginal pressures.  - hold outpt coreg  Acute metabolic encephalopathy from hypercapnia, pneumonia. Hx of depression. - RASS goal 0 to -1 -klonopin to 0.25 mg qhs - continue effexor  Anemia of critical illness, iron deficiency, and chronic disease. - f/u h&h recheck -bp marginal this am so will likely transfuse 1 uprbc - transfuse for Hb < 7 or bleeding - ferric gluconate IV on 3/16  CKD 3a. Hyperkalemia: resolved - indices cont slight worsening 1.3->1.4->1.6 -K improved -uop diminished but still adequate  Severe protein calorie malnutrition. - tube feeds  Steroid induced hyperglycemia. - SSI  Goals of care. - updated pt's husband 3/19 at bedside - asked husband and pt's son to consider what plan of care should be as we move beyond her acute illness - palliative care input appreciated   Best practice:  Diet: TF DVT prophylaxis: Eliquis  GI prophylaxis: protonix Mobility: BR  Code Status: Full Code  Disposition: ICU  Labs:   CMP Latest Ref Rng & Units 10/09/2019 10/08/2019 10/08/2019  Glucose 70 - 99 mg/dL 117(H) - 115(H)  BUN 8 - 23 mg/dL 78(H) - 69(H)  Creatinine 0.44 - 1.00 mg/dL 1.56(H) - 1.47(H)  Sodium 135 - 145 mmol/L 144 - 145  Potassium 3.5 - 5.1 mmol/L 4.9 4.8 5.5(H)  Chloride 98 - 111 mmol/L 106 - 102  CO2 22 - 32 mmol/L 30 - 35(H)  Calcium 8.9 - 10.3 mg/dL 8.4(L) - 8.5(L)  Total Protein 6.5 - 8.1 g/dL - - -  Total Bilirubin 0.3 - 1.2 mg/dL - - -  Alkaline Phos 38 - 126 U/L - - -  AST 15 - 41 U/L - - -  ALT 0 - 44 U/L - - -    CBC Latest Ref Rng & Units 10/09/2019 10/08/2019 10/07/2019  WBC 4.0 - 10.5 K/uL 7.2 8.1 7.2  Hemoglobin 12.0 - 15.0 g/dL 7.0(L) 8.6(L) 7.4(L)  Hematocrit 36.0 - 46.0 % 25.2(L) 31.0(L) 26.1(L)  Platelets 150 - 400 K/uL 178 221 242    ABG    Component Value Date/Time   PHART 7.317 (L) 10/05/2019 0025   PCO2ART 65.0 (H)  10/05/2019 0025   PO2ART 73.0 (L) 10/05/2019 0025   HCO3 33.3 (H) 10/05/2019 0025   TCO2 35 (H) 10/05/2019 0025   O2SAT 92.0 10/05/2019 0025    CBG (last 3)  Recent Labs    10/08/19 1937 10/08/19 2358 10/09/19 0321  GLUCAP 157* 122* 108*   Critical care time: The patient is critically ill with multiple organ systems failure and requires high complexity decision making for assessment and support, frequent evaluation and titration of therapies, application of advanced monitoring technologies and extensive interpretation of multiple databases.  Critical care time 34 mins. This represents my time independent of the NPs time taking care of the pt. This is excluding procedures.    [facility] 10/09/2019, 7:49 AM

## 2019-10-10 ENCOUNTER — Inpatient Hospital Stay (HOSPITAL_COMMUNITY): Payer: Medicare Other

## 2019-10-10 DIAGNOSIS — Z515 Encounter for palliative care: Secondary | ICD-10-CM

## 2019-10-10 LAB — BASIC METABOLIC PANEL
Anion gap: 9 (ref 5–15)
BUN: 79 mg/dL — ABNORMAL HIGH (ref 8–23)
CO2: 31 mmol/L (ref 22–32)
Calcium: 8.5 mg/dL — ABNORMAL LOW (ref 8.9–10.3)
Chloride: 105 mmol/L (ref 98–111)
Creatinine, Ser: 1.51 mg/dL — ABNORMAL HIGH (ref 0.44–1.00)
GFR calc Af Amer: 36 mL/min — ABNORMAL LOW (ref >60)
GFR calc non Af Amer: 31 mL/min — ABNORMAL LOW (ref >60)
Glucose, Bld: 132 mg/dL — ABNORMAL HIGH (ref 70–99)
Potassium: 4.6 mmol/L (ref 3.5–5.1)
Sodium: 145 mmol/L (ref 135–145)

## 2019-10-10 LAB — GLUCOSE, CAPILLARY
Glucose-Capillary: 122 mg/dL — ABNORMAL HIGH (ref 70–99)
Glucose-Capillary: 123 mg/dL — ABNORMAL HIGH (ref 70–99)
Glucose-Capillary: 145 mg/dL — ABNORMAL HIGH (ref 70–99)
Glucose-Capillary: 97 mg/dL (ref 70–99)

## 2019-10-10 LAB — CBC
HCT: 28.8 % — ABNORMAL LOW (ref 36.0–46.0)
Hemoglobin: 7.8 g/dL — ABNORMAL LOW (ref 12.0–15.0)
MCH: 26.1 pg (ref 26.0–34.0)
MCHC: 27.1 g/dL — ABNORMAL LOW (ref 30.0–36.0)
MCV: 96.3 fL (ref 80.0–100.0)
Platelets: 210 10*3/uL (ref 150–400)
RBC: 2.99 MIL/uL — ABNORMAL LOW (ref 3.87–5.11)
RDW: 14.7 % (ref 11.5–15.5)
WBC: 9.8 10*3/uL (ref 4.0–10.5)
nRBC: 0 % (ref 0.0–0.2)

## 2019-10-10 MED ORDER — HYPROMELLOSE (GONIOSCOPIC) 2.5 % OP SOLN
1.0000 [drp] | Freq: Four times a day (QID) | OPHTHALMIC | Status: DC | PRN
  Filled 2019-10-10: qty 15

## 2019-10-10 MED ORDER — FENTANYL CITRATE (PF) 100 MCG/2ML IJ SOLN: 25.0000 ug | INTRAMUSCULAR | Status: DC | PRN

## 2019-10-10 MED ORDER — MIDAZOLAM HCL 2 MG/2ML IJ SOLN
2.0000 mg | Freq: Once | INTRAMUSCULAR | Status: AC
  Administered 2019-10-10: 2 mg via INTRAVENOUS

## 2019-10-10 MED ORDER — MIDAZOLAM HCL 2 MG/2ML IJ SOLN
INTRAMUSCULAR | Status: AC
  Filled 2019-10-10: qty 2

## 2019-10-10 MED ORDER — ACETAMINOPHEN 325 MG RE SUPP: 325.0000 mg | Freq: Four times a day (QID) | RECTAL | Status: DC | PRN

## 2019-10-10 MED ORDER — GLYCOPYRROLATE 0.2 MG/ML IJ SOLN
0.4000 mg | INTRAMUSCULAR | Status: DC
  Administered 2019-10-10: 0.4 mg via INTRAVENOUS
  Filled 2019-10-10: qty 2

## 2019-10-10 MED ORDER — MIDAZOLAM 50MG/50ML (1MG/ML) PREMIX INFUSION
0.5000 mg/h | INTRAVENOUS | Status: DC
  Administered 2019-10-10: 0.5 mg/h via INTRAVENOUS
  Filled 2019-10-10: qty 50

## 2019-10-11 LAB — BPAM RBC
Blood Product Expiration Date: 202103242359
Blood Product Expiration Date: 202104212359
ISSUE DATE / TIME: 202103181011
ISSUE DATE / TIME: 202103201205
Unit Type and Rh: 5100
Unit Type and Rh: 9500

## 2019-10-11 LAB — TYPE AND SCREEN
ABO/RH(D): O POS
Antibody Screen: NEGATIVE
Unit division: 0
Unit division: 0

## 2019-10-19 ENCOUNTER — Ambulatory Visit: Payer: Medicare Other | Admitting: Pulmonary Disease

## 2019-10-22 NOTE — Progress Notes (Signed)
NAME:  Melissa Mcdaniel, MRN:  [id number], DOB:  April 21, 1933, LOS: 66 ADMISSION DATE:  10/11/2019, CONSULTATION DATE:  10/03/2019 REFERRING MD: Dr. Sabra Heck, CHIEF COMPLAINT: SOB  Brief History   86 yo female former smoker presented with dyspnea and altered mental status.  Found to have acute on chronic hypercapnia (on 2 liters oxygen as outpt) and Rt lower lobe pneumonia from Pseudomonas.  Past Medical History  HTN, Persistent A fib, Diastolic CHF, HTN, CKD, Crohn's disease, Depression, COPD with emphysema, bronchiectasis  Significant Hospital Events   3/08 Bronchoscopy 3/10 present to ER 3/13 Extubated 3/14 increased WOB, trial of Bipap unsuccessful >> reintubated, bronchoscopy  Consults:  Palliative care  Procedures:  ETT 3/10 >> 3/13 ETT 3/13 >>  Significant Diagnostic Tests:  PFT 09/01/19 >> FEV1 0.67 (59%), FEV1% 60, TLC 5.35 (136%), DLCO 41% HRCT chest 09/16/19 >> centrilobular emphysema, atherosclerosis, calcified granulomas, apical scarring, mild/lower zone predominant BTX with nodularity, air trapping  Micro Data:  SARS CoV2 3/02 >> negative BAL 3/08 >> Pseudomonas BAL AFB 3/08 >> BAL Fungal 3/08 >>  SARS CoV2 3/10 >> negative Blood 3/10 >> negative BAL 3/14 >> negative  Antimicrobials:  Vanco 3/10  Cefepime 3/10 >> 3/11 Ceftazidime 3/11 >>  Interim history/subjective:  3/20 no acute events overnight; attempted vent wean this a.m. did not tolerate long,now fairly agitated.  3/19: doing ok, no reported events overnight. Concerning is her uop decrease to <1L over past 24 hours and continued rise in indices. Concern BUN will soon play a role in poor mental status prohibiting extubation on top of compromised renal status. Do not feel with chronic comorbidities and age that pt is a good dialysis candidate but may need renal consult as we cont with ongoing d/w family (hopefully today as two out of town sons have arrived to [facility], pt has local daughter as well). hgb also down to  7 without overt bleeding, will check stat H&H and go from there. C/o pain this am but unable to qualify 3/18: hyperkalemic this am. Afebrile and on minimal vent settings. cxr stable. Pt awake but not following commands.   3/17: awake and following commands.  3/16: On vent, sedation.  Objective   Blood pressure 103/61, pulse 81, temperature 99 F (37.2 C), temperature source Oral, resp. rate 20, weight 51.3 kg, SpO2 (!) 76 %.    Vent Mode: PCV FiO2 (%):  [40 %] 40 % Set Rate:  [16 bmp] 16 bmp PEEP:  [5 cmH20] 5 cmH20 Pressure Support:  [10 cmH20] 10 cmH20 Plateau Pressure:  [17 cmH20-18 cmH20] 18 cmH20   Intake/Output Summary (Last 24 hours) at 2019/10/14 1134 Last data filed at 14-Oct-2019 0600 Gross per 24 hour  Intake 1428.19 ml  Output 880 ml  Net 548.19 ml   Filed Weights   10/08/19 0500 10/09/19 0501 October 14, 2019 0630  Weight: 48.4 kg 48.2 kg 51.3 kg    Examination:  General - awake on vent, following commands this am but intermittently; agitated  Eyes - pupils reactive, eomi ENT - ETT in place, mm dry but pink, edentulous  Cardiac - irregular Chest - decreased BS, R>L no wheeze, but expiratory crackles Abdomen - soft, non tender, + bowel sounds Extremities - decreased muscle bulk, frail appearing Skin - no rashes Neuro - PERRLA, EOMI, attempts to be vocal despite ETT; MAE;   intermittently following commands this am.      Resolved Hospital Problem list   Hypomagnesia   Assessment & Plan:   Acute on chronic hypoxic/hypercapnic  respiratory failure from Pseudomonal pneumonia in setting of severe COPD/emphysema and bronchiectasis. - failed extubation trial 3/13 - pressure support wean as able; so far not tolerating weaning for periods greater than 2-3 hours. - ongoing discussions with family re: one way extubation vs need for reintubation and trach. Hopeful for palliative meeting today with family -CXR pulmonary edema, effusions very slightly improved -wean  prednisone  as able  - scheduled BDs - completed fortaz  Permanent A fib, chronic diastolic CHF, HTN. Bradycardia from precedex: improved - continue amiodarone, eliquis - cont holding hydralazine today with marginal pressures.  - hold outpt coreg  Acute metabolic encephalopathy from hypercapnia, pneumonia. Hx of depression. - RASS goal 0 to -1 -klonopin to 0.25 mg qhs - continue effexor  Anemia of critical illness, iron deficiency, and chronic disease. - f/u h&h recheck -bp marginal this am so will likely transfuse 1 uprbc - transfuse for Hb < 7 or bleeding - ferric gluconate IV on 3/16  CKD 3a. Hyperkalemia: resolved - indices cont slight worsening 1.3->1.4->1.6-->1.5 -K improved -uop diminished  Severe protein calorie malnutrition. - tube feeds  Steroid induced hyperglycemia. - SSI  Goals of care. - updated pt's husband 3/19 at bedside - asked husband and pt's son to consider what plan of care should be as we move beyond her acute illness - palliative care input appreciated --family meeting with palliative care team today at 1 p.m.   Best practice:  Diet: TF DVT prophylaxis: Eliquis  GI prophylaxis: protonix Mobility: BR  Code Status: Full Code  Disposition: ICU  Labs:   CMP Latest Ref Rng & Units Oct 19, 2019 10/09/2019 10/08/2019  Glucose 70 - 99 mg/dL 132(H) 117(H) -  BUN 8 - 23 mg/dL 79(H) 78(H) -  Creatinine 0.44 - 1.00 mg/dL 1.51(H) 1.56(H) -  Sodium 135 - 145 mmol/L 145 144 -  Potassium 3.5 - 5.1 mmol/L 4.6 4.9 4.8  Chloride 98 - 111 mmol/L 105 106 -  CO2 22 - 32 mmol/L 31 30 -  Calcium 8.9 - 10.3 mg/dL 8.5(L) 8.4(L) -  Total Protein 6.5 - 8.1 g/dL - - -  Total Bilirubin 0.3 - 1.2 mg/dL - - -  Alkaline Phos 38 - 126 U/L - - -  AST 15 - 41 U/L - - -  ALT 0 - 44 U/L - - -    CBC Latest Ref Rng & Units 2019-10-19 10/09/2019 10/09/2019  WBC 4.0 - 10.5 K/uL 9.8 - 7.2  Hemoglobin 12.0 - 15.0 g/dL 7.8(L) 7.1(L) 7.0(L)  Hematocrit 36.0 - 46.0 % 28.8(L) 25.9(L)  25.2(L)  Platelets 150 - 400 K/uL 210 - 178    ABG    Component Value Date/Time   PHART 7.317 (L) 10/05/2019 0025   PCO2ART 65.0 (H) 10/05/2019 0025   PO2ART 73.0 (L) 10/05/2019 0025   HCO3 33.3 (H) 10/05/2019 0025   TCO2 35 (H) 10/05/2019 0025   O2SAT 92.0 10/05/2019 0025    CBG (last 3)  Recent Labs    10/09/19 2357 10/19/2019 0252 2019-10-19 0857  GLUCAP 122* 123* 97   I have independently seen and examined the patient, reviewed data, and developed an assessment and plan. A total of 39 minutes were spent in critical care assessment and medical decision making. This critical care time does not reflect procedure time, or teaching time or supervisory time of PA/NP/Med student/Med Resident, etc but could involve care discussion time.  Bonna Gains, MD PhD  [facility] 10/19/2019, 11:41 AM

## 2019-10-22 NOTE — Death Summary Note (Signed)
DEATH SUMMARY   Patient Details  Name: Melissa Mcdaniel MRN: [id number] DOB: [birth year]  Admission/Discharge Information   Admit Date:  09-Oct-2019  Date of Death: Date of Death: 10/19/2019  Time of Death: Time of Death: September 25, 2128  Length of Stay: 09/10/22  Referring Physician: Harlan Stains, MD   Reason(s) for Hospitalization  Acute hypoxic resp failure  Diagnoses  Preliminary cause of death: Acute hypoxemic respiratory failure ([place]) Secondary Diagnoses (including complications and co-morbidities):  Active Problems:   Acute respiratory failure with hypoxia and hypercarbia (HCC)   Protein-calorie malnutrition, severe   Palliative care by specialist   Goals of care, counseling/discussion   DNR (do not resuscitate)   Terminal care   Brief Hospital Course (including significant findings, care, treatment, and services provided and events leading to death)  86 y/o F who presented to [facility] on 10-09-2022 with reports of increasing SOB, AMS.    Most recently, the patient was seen on 2/16 and identified to need O2 with exertion.  On 2/18 she was seen for evaluation and felt at that time to have an increase in possible mucus plugging, noted prior concern of MAI appearance on CT's in 09/25/18.  HRCT was ordered to evaluate new O2 needs and assessed on 2/23 with findings of slight progression in the mid and lower lung zone predominant peribronchovascular nodularity & consolidation with associated bronchiectasis and peribronchial thickening, findings concerning for MAI, separate area of new nodularity in the RUL, no evidence of fibrotic interstitial lung disease, mild air trapping indicative of small airways disease, emphysema,.  On 2/18 visit, there was discussion of referral to Palliative Care for symptom management. The patient was seen again in the office 25-Sep-2022 for follow up of HRCT and FOB was arranged at that time.  She was COVID negative on 2/26 and 3/2.  The patient underwent FOB on 3/8 with cultures growing  pan-sensitive pseudomonas.    She presented to [facility] on 2022/10/09 with reports of increased SOB, and was found to have hypercarbic respiratory failure in the setting of suspected RLL PNA.  Required intubation for respiratory distress.    PCCM called for ICU admit.   Pt had some mild improvement warranting trial of extubation but unfortunately did not tolerate and req reintubation.  Ultimately pt was unable to be successfully weaned. Palliative care consulted and pt was compassionately extubated on 2022-10-19. Pt expired peacefully with family at her side.       Pertinent Labs and Studies  Significant Diagnostic Studies DG Chest 1 View  Result Date: 10/04/2019 CLINICAL DATA:  Respiratory failure EXAM: CHEST  1 VIEW COMPARISON:  10/02/2019 chest radiograph. FINDINGS: Right rotated chest radiograph. Interval extubation and removal of enteric tube. Stable cardiomediastinal silhouette with mild cardiomegaly. No pneumothorax. Small bilateral pleural effusions, slightly increased on the right and stable on the left. Diffuse patchy bilateral lung opacities, most prominent in the right parahilar region, not substantially changed. IMPRESSION: 1. Interval extubation and removal of enteric tube. 2. Stable mild cardiomegaly and diffuse patchy bilateral lung opacities, most prominent in the right parahilar region, favor pneumonia. 3. Small bilateral pleural effusions, slightly increased on the right and stable on the left. Electronically Signed   By: Ilona Sorrel M.D.   On: 10/04/2019 09:03   DG Abd 1 View  Result Date: 10/04/2019 CLINICAL DATA:  Orogastric tube placement. EXAM: ABDOMEN - 1 VIEW COMPARISON:  None. FINDINGS: A nasogastric tube is seen with its distal tip overlying the lower right abdomen. Given patient positioning this is likely  within the region of the gastric antrum. The bowel gas pattern is normal. No radio-opaque calculi or other significant radiographic abnormality are seen. A radiopaque  intramedullary rod and compression screw device are seen within the proximal right femur. IMPRESSION: 1. Nasogastric tube positioning, as described above. Electronically Signed   By: Virgina Norfolk M.D.   On: 10/04/2019 15:10   DG CHEST PORT 1 VIEW  Result Date: 10/11/19 CLINICAL DATA:  Acute respiratory failure EXAM: PORTABLE CHEST 1 VIEW COMPARISON:  October 08, 2019 FINDINGS: The ETT is in good position. The feeding tube terminates below today's film. No pneumothorax. Stable cardiomegaly. The hila and mediastinum are unchanged. Bilateral pleural effusions with underlying opacities are stable. Diffuse interstitial opacities are mildly improved. No other acute abnormalities. IMPRESSION: 1. Support apparatus as above. 2. Improving bilateral interstitial opacities, likely edema. 3. Persistent effusions with underlying opacities, likely atelectasis. Electronically Signed   By: Dorise Bullion III M.D   On: 2019/10/11 09:30   DG CHEST PORT 1 VIEW  Result Date: 10/08/2019 CLINICAL DATA:  Endotracheal tube. EXAM: PORTABLE CHEST 1 VIEW COMPARISON:  October 07, 2019. FINDINGS: Endotracheal and feeding tubes are in good position. Stable cardiomediastinal silhouette with central pulmonary vascular congestion. No pneumothorax is noted. Stable bilateral lung opacities are noted concerning for edema or pneumonia. Probable bilateral pleural effusions. Bony thorax is unremarkable. IMPRESSION: Stable support apparatus. Stable bilateral lung opacities are noted concerning for edema or pneumonia with probable associated pleural effusions. Electronically Signed   By: Marijo Conception M.D.   On: 10/08/2019 08:40   DG Chest Port 1 View  Result Date: 10/07/2019 CLINICAL DATA:  Hypoxia EXAM: PORTABLE CHEST 1 VIEW COMPARISON:  October 06, 2019 FINDINGS: Endotracheal tube tip is 4.0 cm above the carina. Feeding tube tip is below the diaphragm. No evident pneumothorax. There is persistent airspace consolidation in the right lower  lung region. There is also ill-defined airspace opacity in the left perihilar region extending into the left lower lobe, slightly increased. Pleural effusions bilaterally are stable. Heart size and pulmonary vascular normal. No adenopathy. There is aortic atherosclerosis. No bone lesions. IMPRESSION: Tube positions as described without pneumothorax. Overall increase in airspace opacity on the left. Stable consolidation right lower lung region. Small pleural effusions bilaterally. Suspect multifocal pneumonia as most likely etiology, although a degree of superimposed pulmonary edema cannot be excluded. Stable cardiac silhouette. No evident adenopathy. Aortic Atherosclerosis (ICD10-I70.0). Electronically Signed   By: Lowella Grip III M.D.   On: 10/07/2019 09:27   DG Chest Port 1 View  Result Date: 10/06/2019 CLINICAL DATA:  Respiratory failure EXAM: PORTABLE CHEST 1 VIEW COMPARISON:  10/04/2019 FINDINGS: Support devices are stable. Improved airspace disease in the left mid lung. Stable bilateral effusions and lower lobe airspace opacities. Heart is normal size. No acute bony abnormality. IMPRESSION: Improving airspace disease in the left mid lung. No change in the small bilateral effusions and bibasilar opacities. Electronically Signed   By: Rolm Baptise M.D.   On: 10/06/2019 09:33   DG CHEST PORT 1 VIEW  Result Date: 10/04/2019 CLINICAL DATA:  Endotracheal tube placement. EXAM: PORTABLE CHEST 1 VIEW COMPARISON:  10/04/2019 and prior radiographs FINDINGS: The lung apices are not on the field of view but the bedside physician did not desire a repeat as this was only for endotracheal tube position. An endotracheal tube is identified with tip 3.5 cm above the carina. Bilateral airspace opacities and RIGHT LOWER lung consolidation/atelectasis again noted. There has been little interval change otherwise  since the prior study. IMPRESSION: 1. Endotracheal tube with tip 3.5 cm above the carina. 2. Unchanged  bilateral airspace opacities and RIGHT LOWER lung consolidation/atelectasis. Electronically Signed   By: Margarette Canada M.D.   On: 10/04/2019 14:52   DG CHEST PORT 1 VIEW  Result Date: 10/02/2019 CLINICAL DATA:  Respiratory failure. EXAM: PORTABLE CHEST 1 VIEW COMPARISON:  10/01/2019 FINDINGS: The endotracheal tube is 3.8 cm above the carina. The NG tube is coursing down the esophagus and into the stomach. Stable mild cardiac enlargement and tortuous calcified thoracic aorta. Stable emphysematous changes, pulmonary scarring and bronchiectasis. Persistent diffuse largely interstitial process along with right basilar infiltrate and small right effusion. IMPRESSION: 1. Stable support apparatus. 2. Persistent diffuse interstitial process and right basilar infiltrate. Electronically Signed   By: Marijo Sanes M.D.   On: 10/02/2019 09:06   DG Chest Port 1 View  Result Date: 10/01/2019 CLINICAL DATA:  Respiratory failure.  Hypoxia. EXAM: PORTABLE CHEST 1 VIEW COMPARISON:  Single-view of the chest 10/04/2019. PA and lateral chest 09/10/2019. CT chest 09/15/2019. FINDINGS: The patient is rotated on the study. Endotracheal tube is in place with the tip near the carina. Recommend withdrawal of 1-2 cm. OG tube tip is seen in the fundus of the stomach. Airspace disease in the right mid and lower lung zone and diffuse interstitial prominence persist. Lungs are emphysematous. Heart size is mildly enlarged. Atherosclerosis noted. IMPRESSION: ETT tip is near the carina.  Recommend withdrawal of 1-2 cm. No change in right basilar airspace disease worrisome for pneumonia and diffuse interstitial prominence which could be due to edema or infection. Emphysema. Atherosclerosis. Electronically Signed   By: Inge Rise M.D.   On: 10/01/2019 09:26   DG Chest Portable 1 View  Result Date: 10/11/2019 CLINICAL DATA:  Hypoxia EXAM: PORTABLE CHEST 1 VIEW COMPARISON:  September 08, 2019 FINDINGS: Endotracheal tube tip is 3.4 cm above  the carina. Nasogastric tube tip and side port are in the stomach. No pneumothorax. There is a small right pleural effusion with ill-defined airspace opacity in the right lower lung region. Elsewhere there is diffuse interstitial thickening, similar to prior study. Heart is upper normal in size with pulmonary vascularity normal. There is aortic atherosclerosis. No adenopathy. No bone lesions. IMPRESSION: Tube positions as described without pneumothorax. Airspace opacity, likely pneumonia, in the right lower lung region with small right pleural effusion. Underlying apparent interstitial fibrosis throughout the lungs bilaterally. Stable cardiac silhouette. Aortic Atherosclerosis (ICD10-I70.0). Electronically Signed   By: Lowella Grip III M.D.   On: 10/15/2019 08:18   [place] EKG SITE RITE  Result Date: 10/04/2019 If Site Rite image not attached, placement could not be confirmed due to current cardiac rhythm.   Microbiology No results found for this or any previous visit (from the past 240 hour(s)).  Lab Basic Metabolic Panel: No results for input(s): NA, K, CL, CO2, GLUCOSE, BUN, CREATININE, CALCIUM, MG, PHOS in the last 168 hours. Liver Function Tests: No results for input(s): AST, ALT, ALKPHOS, BILITOT, PROT, ALBUMIN in the last 168 hours. No results for input(s): LIPASE, AMYLASE in the last 168 hours. No results for input(s): AMMONIA in the last 168 hours. CBC: No results for input(s): WBC, NEUTROABS, HGB, HCT, MCV, PLT in the last 168 hours. Cardiac Enzymes: No results for input(s): CKTOTAL, CKMB, CKMBINDEX, TROPONINI in the last 168 hours. Sepsis Labs: No results for input(s): PROCALCITON, WBC, LATICACIDVEN in the last 168 hours.  Procedures/Operations  3/14 bronch  Audria Nine 10/20/2019, 9:40 AM

## 2019-10-22 NOTE — Progress Notes (Signed)
Palliative Medicine Inpatient Follow Up Note  HPI: 86 y.o.femalewith past medical history of emphysema, bronchiectasis, HTN, diastolic CHF, atrial fibrillation, CKD stage 3, Crohn's diseaseadmitted on 3/10/2021with worsening shortness of breath s/p bronchoscopy 09/28/19 growing Pseudomonas in RLL pneumonia requiring intubation.Extubated 3/13 but required re-intubation and repeat bronchoscopy 3/14.  Palliative care was asked on 3/15 to get involved for establishing goals of care.   Today's Discussion (10/13/19): Chart reviewed. I met with Marcene Brawn, bedside RN.  She shared with me concerns that the patient was having a much harder time in terms of her agitation. She had again failed weaning trials.   I met with Delfino Lovett (husband) and Monna Fam. (son) I shared with them my concern that we may be prolonging the inevitable and contributing to a level of suffer for this patient. [name] said that he had thought about this overnight and realizes that she is not doing well. He said that he is hopeful we can get her comfortable. He is in agreement with a more comfort oriented approach.   We reviewed what a comfort oriented approach would look like in Zamorah's situation inclusive of the use of opioids, benzodiazepines, and anticholinergics to control symptoms. We reviewed the removal of the intubation tube with respiratory therapy and the cessation of mechanical ventilator.    Family from [place] were able to come to bedside to spend time with Barnetta Chapel.   Discussed with Kylin's family that Palliative care would be here to support them.   Vital Signs Vitals:   2019-10-13 1107 10/13/2019 1228  BP:    Pulse: (!) 102   Resp: 18   Temp:  99.2 F (37.3 C)  SpO2: 93%     Intake/Output Summary (Last 24 hours) at 13-Oct-2019 1328 Last data filed at 13-Oct-2019 0600 Gross per 24 hour  Intake 1351.7 ml  Output 880 ml  Net 471.7 ml   Last Weight  Most recent update: 10/13/19  6:47 AM   Weight   51.3 kg (113 lb 1.5 oz)           Gen: Chronically ill appearing cachectic elderly female HEENT: dry moist mucous membranes,coretrack in place, intubation tube in place CV: Regular rate and rhythm, no murmurs rubs or gallops PULM: Ventilated, (+) rhonchi ABD: soft/nontender  EXT: No edema  Neuro: Disoriented  Recommendations and Plan: DNR  Comfort oriented care initiated, all non-comfort medications/interventions stopped  Chaplain support  Catholic priest provided last rights  Symptoms: Dyspnea: Pain:  - Fentanyl gtt  - Fentanyl 25-70mg IV Q15M PRN  - Albuterol PRN Fever:  - Tylenol 6538mPO/PR Q6H PRN  Agitation: Anxiety:  - Midazolam gtt Nausea:  - Zofran 39m29mIV Q6H PRN  Secretions:  - Glycopyrrolate 0.39mg6m Q4H ATC Dry Eyes:  - Artificial Tears PRN Xerostomia:  -  Chlorhexidine 15ml57m  -  BID oral care Urinary Retention:  - Maintain foley  Loose Stools:  - Maintain rectal tube Spiritual:  - Chaplain consult  - Catholic priest was able to come to provide last rights  Time Spent: 55 Gr54ter than 50% of the time was spent in counseling and coordination of care ______________________________________________________________________________________ [facility] Team Cell Phone: [phone] utilize secure chat with additional questions, if there is no response within 30 minutes please call the above phone number  Palliative Medicine Team providers are available by phone from 7am to 7pm daily and can be reached through the team cell phone.  Should this patient  require assistance outside of these hours, please call the patient's attending physician.

## 2019-10-22 NOTE — Progress Notes (Signed)
Marybelle Killings From [facility] called  [phone]

## 2019-10-22 NOTE — Progress Notes (Signed)
Patent extubated per order one-way; comfort. Placed on 2 LPM nasal cannula for patient comfort. RN and family at bedside.

## 2019-10-22 DEATH — deceased

## 2019-10-27 LAB — FUNGUS CULTURE RESULT

## 2019-10-27 LAB — FUNGUS CULTURE WITH STAIN

## 2019-10-27 LAB — FUNGAL ORGANISM REFLEX

## 2019-11-19 LAB — ACID FAST CULTURE WITH REFLEXED SENSITIVITIES (MYCOBACTERIA)
Acid Fast Culture: NEGATIVE
Acid Fast Culture: NEGATIVE

## 2021-10-15 IMAGING — DX DG CHEST 2V
2 series · 2 of 2 positions shown · non-contrast
Comparison: 05/21/2018

CLINICAL DATA: Shortness of breath. Emphysema. Congestive heart
failure.

EXAM:
CHEST - 2 VIEW

[chest pa]
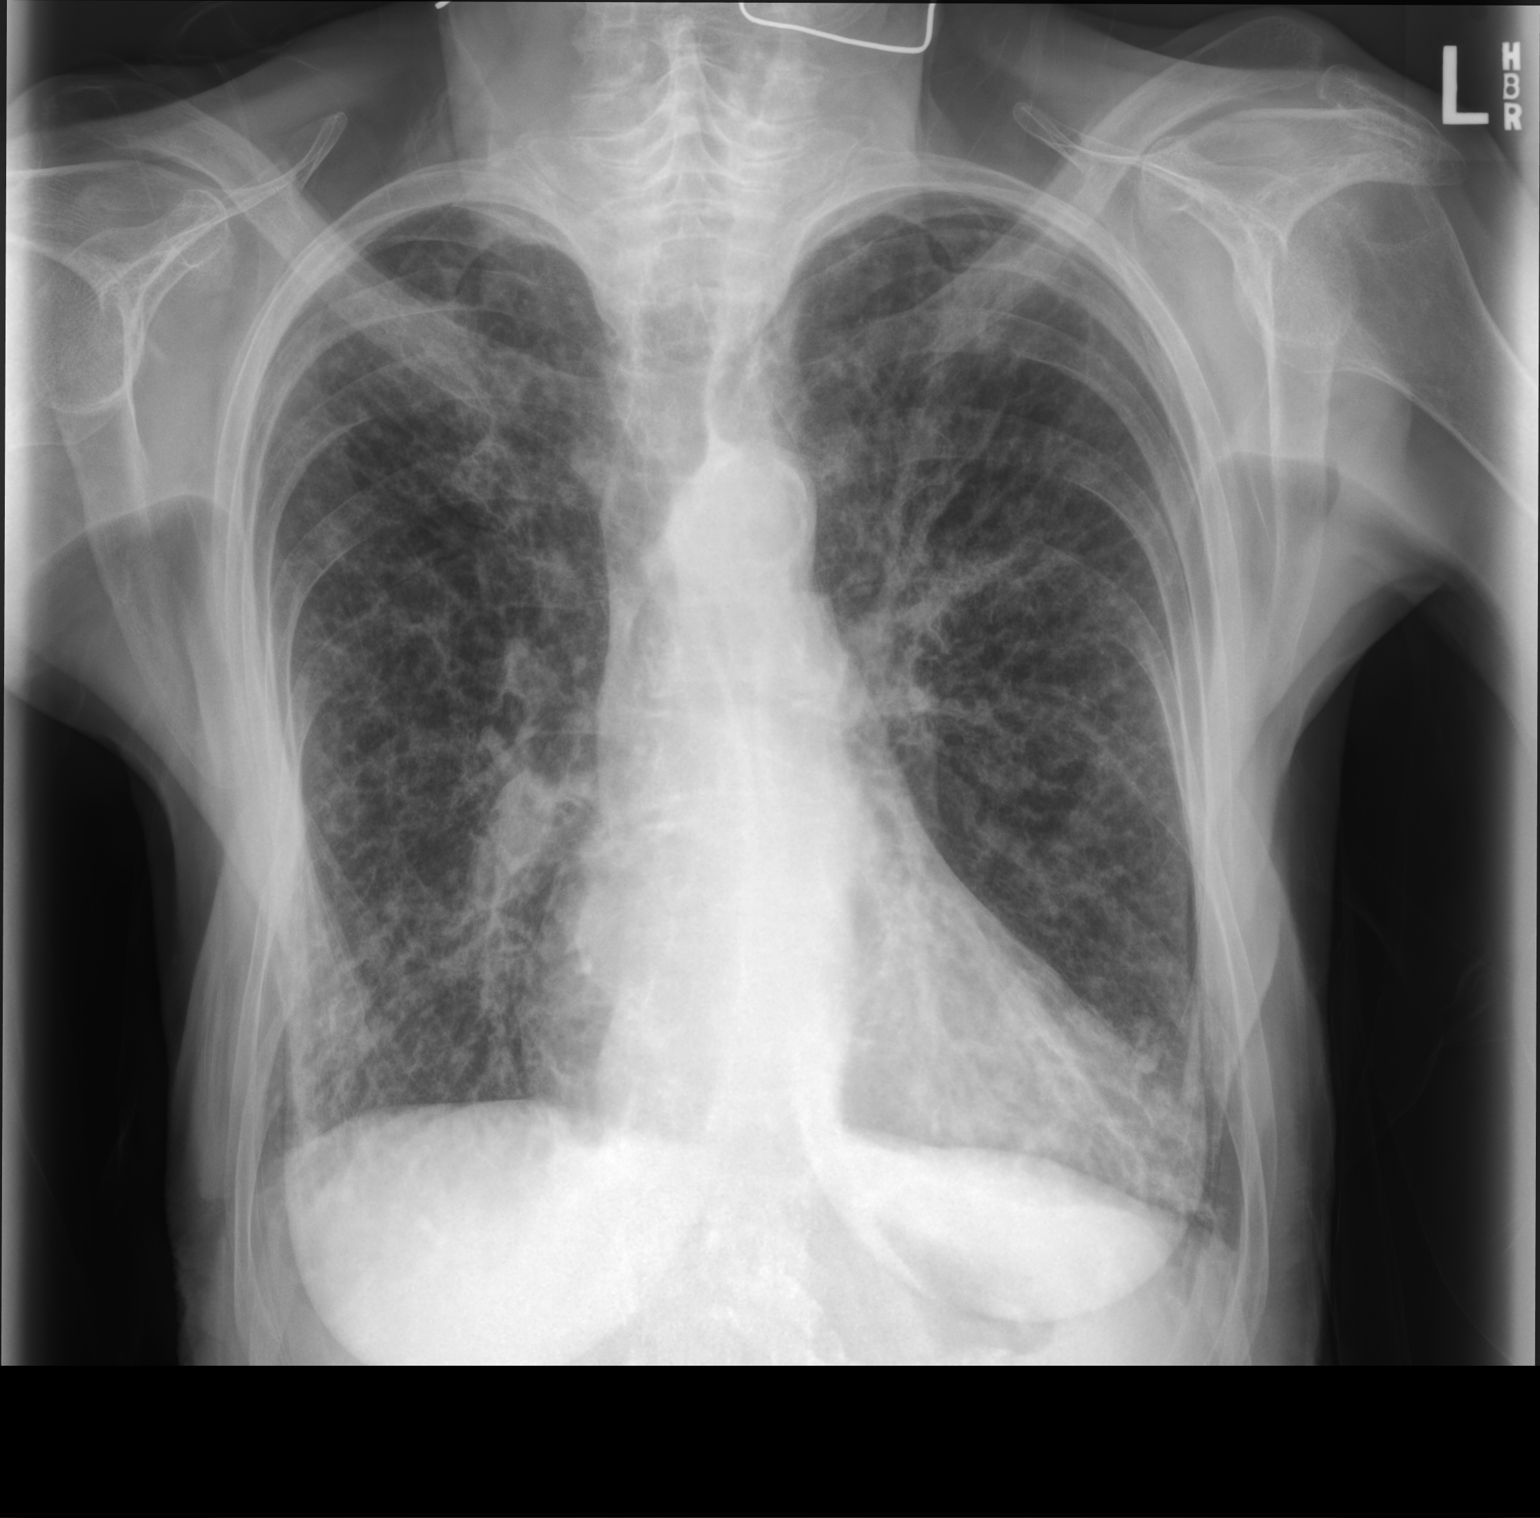

[chest lat]
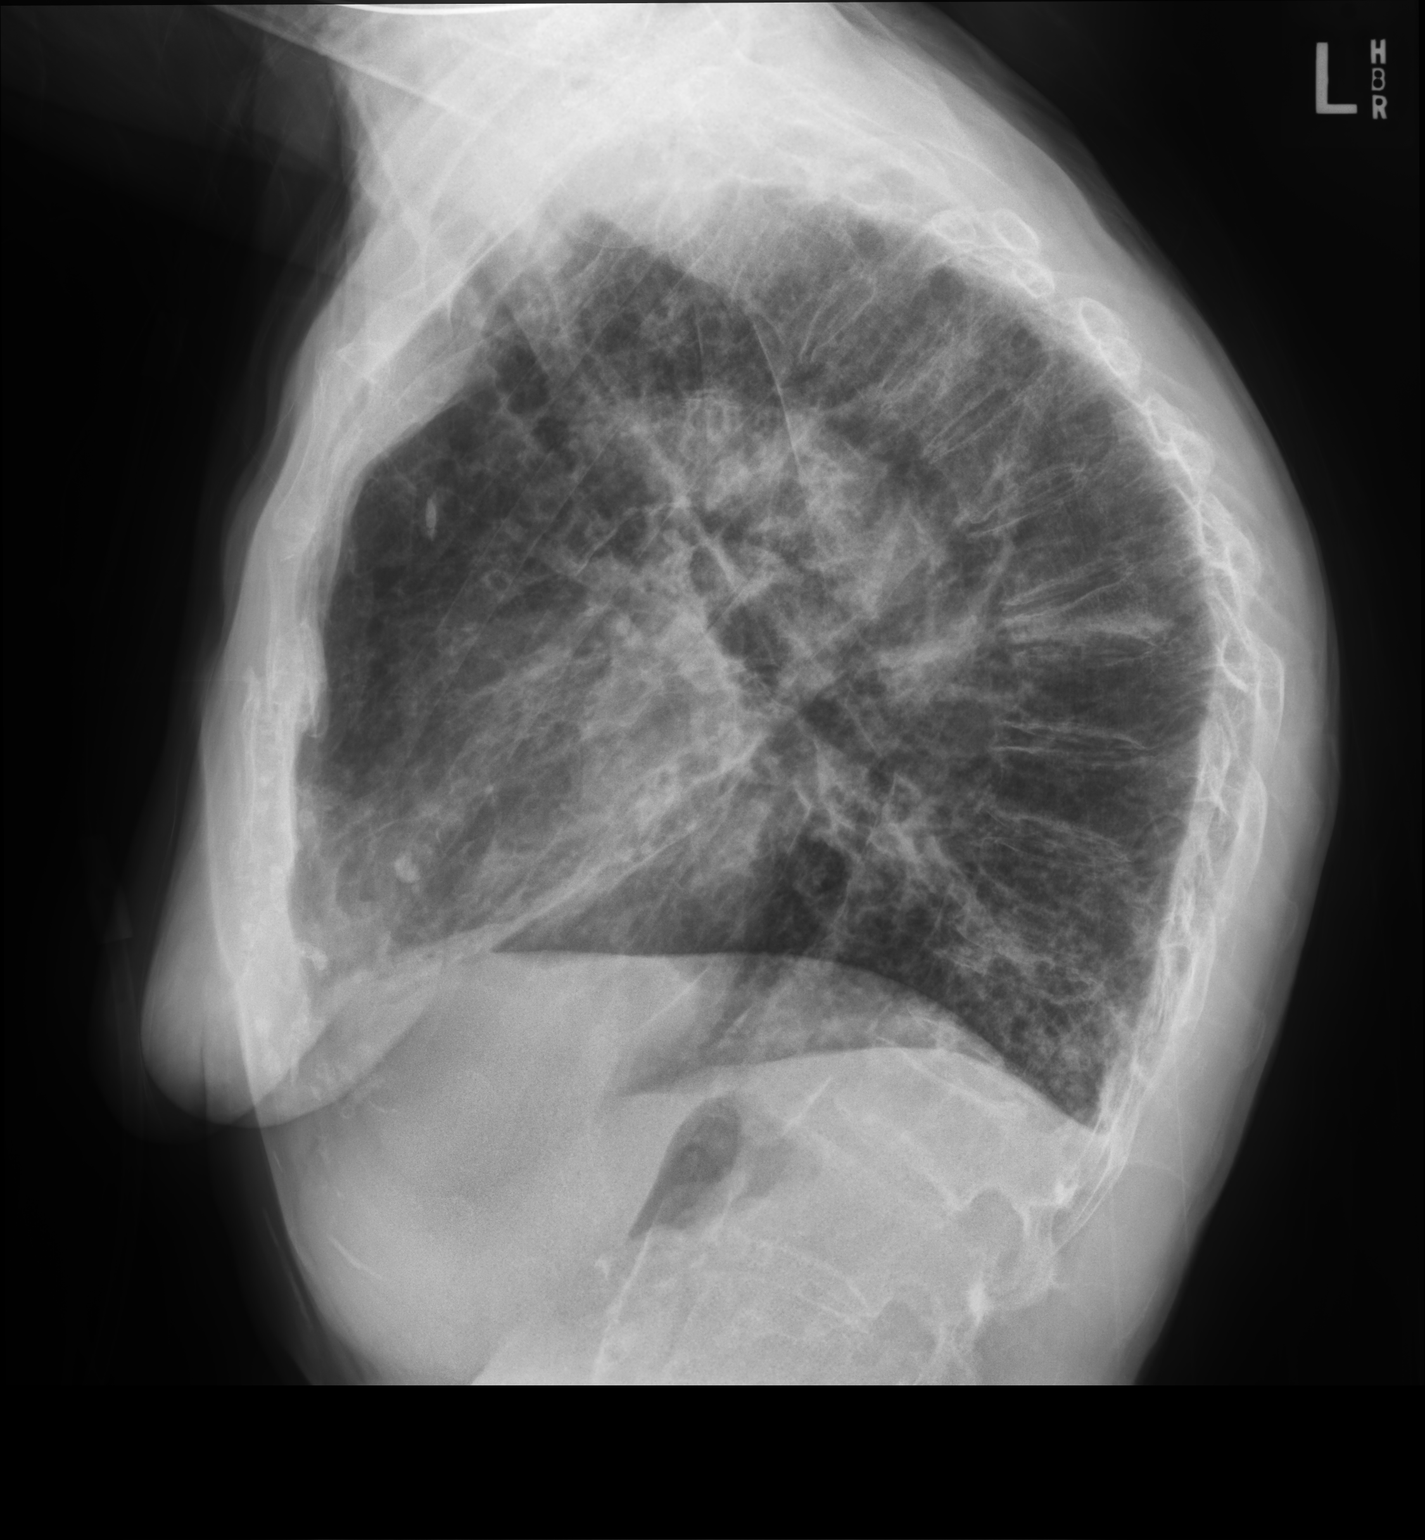

[2 of 2 positions shown; findings below may reference images not displayed]

FINDINGS: Mild cardiomegaly stable. Aortic atherosclerosis incidentally noted.
Pulmonary hyperinflation is again seen, consistent with COPD.
Increased coarsening of interstitial lung markings is seen
bilaterally, consistent with progressive interstitial lung disease.

No evidence of pulmonary consolidation or pleural effusion. A
midthoracic vertebral body compression fracture is new since prior
exam in 7854.
IMPRESSION: 1. Interval progression of chronic interstitial lung disease. No
evidence of pulmonary consolidation or pleural effusion.
2. Stable mild cardiomegaly and COPD.
3. Midthoracic vertebral body compression fracture, new since 7854
exam.
